# Patient Record
Sex: Female | Born: 1960
Health system: Southern US, Community
[De-identification: ages and names within clinical notes are randomized; demographics above are authoritative.]

## PROBLEM LIST (undated history)

## (undated) DIAGNOSIS — K432 Incisional hernia without obstruction or gangrene: Secondary | ICD-10-CM

## (undated) DIAGNOSIS — I10 Essential (primary) hypertension: Secondary | ICD-10-CM

## (undated) DIAGNOSIS — IMO0001 Reserved for inherently not codable concepts without codable children: Secondary | ICD-10-CM

## (undated) DIAGNOSIS — N281 Cyst of kidney, acquired: Secondary | ICD-10-CM

## (undated) DIAGNOSIS — I82629 Acute embolism and thrombosis of deep veins of unspecified upper extremity: Secondary | ICD-10-CM

## (undated) DIAGNOSIS — C189 Malignant neoplasm of colon, unspecified: Secondary | ICD-10-CM

## (undated) DIAGNOSIS — Z5189 Encounter for other specified aftercare: Secondary | ICD-10-CM

## (undated) DIAGNOSIS — Z8669 Personal history of other diseases of the nervous system and sense organs: Secondary | ICD-10-CM

## (undated) DIAGNOSIS — K5792 Diverticulitis of intestine, part unspecified, without perforation or abscess without bleeding: Secondary | ICD-10-CM

## (undated) DIAGNOSIS — D1800 Hemangioma unspecified site: Secondary | ICD-10-CM

## (undated) DIAGNOSIS — G620 Drug-induced polyneuropathy: Secondary | ICD-10-CM

## (undated) DIAGNOSIS — D649 Anemia, unspecified: Secondary | ICD-10-CM

## (undated) DIAGNOSIS — E119 Type 2 diabetes mellitus without complications: Secondary | ICD-10-CM

## (undated) DIAGNOSIS — C186 Malignant neoplasm of descending colon: Principal | ICD-10-CM

## (undated) DIAGNOSIS — R7303 Prediabetes: Secondary | ICD-10-CM

## (undated) DIAGNOSIS — G4730 Sleep apnea, unspecified: Secondary | ICD-10-CM

## (undated) DIAGNOSIS — K219 Gastro-esophageal reflux disease without esophagitis: Secondary | ICD-10-CM

## (undated) HISTORY — DX: Acute embolism and thrombosis of deep veins of unspecified upper extremity: I82.629

## (undated) HISTORY — DX: Type 2 diabetes mellitus without complications: E11.9

## (undated) HISTORY — DX: Anemia, unspecified: D64.9

## (undated) HISTORY — DX: Sleep apnea, unspecified: G47.30

## (undated) HISTORY — DX: Reserved for inherently not codable concepts without codable children: IMO0001

## (undated) HISTORY — DX: Incisional hernia without obstruction or gangrene: K43.2

## (undated) HISTORY — DX: Malignant neoplasm of colon, unspecified: C18.9

## (undated) HISTORY — DX: Diverticulitis of intestine, part unspecified, without perforation or abscess without bleeding: K57.92

## (undated) HISTORY — DX: Drug-induced polyneuropathy: G62.0

## (undated) HISTORY — DX: Hemangioma unspecified site: D18.00

## (undated) HISTORY — DX: Cyst of kidney, acquired: N28.1

## (undated) HISTORY — DX: Malignant neoplasm of descending colon: C18.6

## (undated) HISTORY — DX: Encounter for other specified aftercare: Z51.89

## (undated) HISTORY — PX: PORTACATH PLACEMENT: SHX2246

## (undated) HISTORY — PX: ABDOMINAL HYSTERECTOMY: SHX81

## (undated) HISTORY — DX: Personal history of other diseases of the nervous system and sense organs: Z86.69

---

## 2005-05-21 ENCOUNTER — Emergency Department (HOSPITAL_COMMUNITY): Admission: EM | Admit: 2005-05-21 | Discharge: 2005-05-21 | Payer: Self-pay | Admitting: Emergency Medicine

## 2005-12-07 ENCOUNTER — Emergency Department (HOSPITAL_COMMUNITY): Admission: EM | Admit: 2005-12-07 | Discharge: 2005-12-07 | Payer: Self-pay | Admitting: Emergency Medicine

## 2006-09-07 ENCOUNTER — Emergency Department (HOSPITAL_COMMUNITY): Admission: EM | Admit: 2006-09-07 | Discharge: 2006-09-07 | Payer: Self-pay | Admitting: Emergency Medicine

## 2007-01-02 ENCOUNTER — Emergency Department (HOSPITAL_COMMUNITY): Admission: EM | Admit: 2007-01-02 | Discharge: 2007-01-03 | Payer: Self-pay | Admitting: Emergency Medicine

## 2007-03-20 ENCOUNTER — Emergency Department (HOSPITAL_COMMUNITY): Admission: EM | Admit: 2007-03-20 | Discharge: 2007-03-20 | Payer: Self-pay | Admitting: Emergency Medicine

## 2007-08-21 ENCOUNTER — Emergency Department (HOSPITAL_COMMUNITY): Admission: EM | Admit: 2007-08-21 | Discharge: 2007-08-21 | Payer: Self-pay | Admitting: Emergency Medicine

## 2010-09-07 ENCOUNTER — Emergency Department (HOSPITAL_COMMUNITY): Admission: EM | Admit: 2010-09-07 | Discharge: 2010-09-07 | Payer: Self-pay | Admitting: Emergency Medicine

## 2010-11-10 DIAGNOSIS — G620 Drug-induced polyneuropathy: Secondary | ICD-10-CM

## 2010-11-10 HISTORY — DX: Drug-induced polyneuropathy: G62.0

## 2011-01-22 LAB — CBC
HCT: 32.8 % — ABNORMAL LOW (ref 36.0–46.0)
Hemoglobin: 10.6 g/dL — ABNORMAL LOW (ref 12.0–15.0)
MCH: 22.6 pg — ABNORMAL LOW (ref 26.0–34.0)
MCHC: 32.3 g/dL (ref 30.0–36.0)
MCV: 70.1 fL — ABNORMAL LOW (ref 78.0–100.0)
Platelets: 191 10*3/uL (ref 150–400)
RBC: 4.68 MIL/uL (ref 3.87–5.11)
RDW: 16.7 % — ABNORMAL HIGH (ref 11.5–15.5)
WBC: 6.3 10*3/uL (ref 4.0–10.5)

## 2011-01-22 LAB — BASIC METABOLIC PANEL
BUN: 10 mg/dL (ref 6–23)
CO2: 26 mEq/L (ref 19–32)
Calcium: 9.1 mg/dL (ref 8.4–10.5)
Chloride: 106 mEq/L (ref 96–112)
Creatinine, Ser: 1.05 mg/dL (ref 0.4–1.2)
GFR calc Af Amer: >60 mL/min (ref >60)
GFR calc non Af Amer: 56 mL/min — ABNORMAL LOW (ref >60)
Glucose, Bld: 101 mg/dL — ABNORMAL HIGH (ref 70–99)
Potassium: 4 mEq/L (ref 3.5–5.1)
Sodium: 136 mEq/L (ref 135–145)

## 2011-01-22 LAB — DIFFERENTIAL
Basophils Absolute: 0 10*3/uL (ref 0.0–0.1)
Basophils Relative: 0 % (ref 0–1)
Eosinophils Absolute: 0.1 10*3/uL (ref 0.0–0.7)
Eosinophils Relative: 2 % (ref 0–5)
Lymphocytes Relative: 33 % (ref 12–46)
Lymphs Abs: 2.1 10*3/uL (ref 0.7–4.0)
Monocytes Absolute: 0.6 10*3/uL (ref 0.1–1.0)
Monocytes Relative: 9 % (ref 3–12)
Neutro Abs: 3.5 10*3/uL (ref 1.7–7.7)
Neutrophils Relative %: 56 % (ref 43–77)

## 2011-01-22 LAB — POCT CARDIAC MARKERS
CKMB, poc: 1.7 ng/mL (ref 1.0–8.0)
CKMB, poc: 2 ng/mL (ref 1.0–8.0)
Myoglobin, poc: 117 ng/mL (ref 12–200)
Myoglobin, poc: 96.2 ng/mL (ref 12–200)
Troponin i, poc: <0.05 ng/mL (ref 0.00–0.09)
Troponin i, poc: <0.05 ng/mL (ref 0.00–0.09)

## 2011-01-22 LAB — PROTIME-INR
INR: 1.05 (ref 0.00–1.49)
Prothrombin Time: 13.9 seconds (ref 11.6–15.2)

## 2011-03-07 ENCOUNTER — Ambulatory Visit (INDEPENDENT_AMBULATORY_CARE_PROVIDER_SITE_OTHER): Payer: PRIVATE HEALTH INSURANCE

## 2011-03-07 ENCOUNTER — Inpatient Hospital Stay (INDEPENDENT_AMBULATORY_CARE_PROVIDER_SITE_OTHER)
Admission: RE | Admit: 2011-03-07 | Discharge: 2011-03-07 | Disposition: A | Discharge status: Home / Self Care | Payer: PRIVATE HEALTH INSURANCE | Source: Ambulatory Visit | Attending: Family Medicine | Admitting: Family Medicine

## 2011-03-07 DIAGNOSIS — R10819 Abdominal tenderness, unspecified site: Secondary | ICD-10-CM

## 2011-03-07 DIAGNOSIS — K921 Melena: Secondary | ICD-10-CM

## 2011-03-07 LAB — POCT URINALYSIS DIP (DEVICE)
Bilirubin Urine: NEGATIVE
Glucose, UA: NEGATIVE mg/dL
Hgb urine dipstick: NEGATIVE
Ketones, ur: NEGATIVE mg/dL
Nitrite: NEGATIVE
Protein, ur: NEGATIVE mg/dL
Specific Gravity, Urine: 1.015 (ref 1.005–1.030)
Urobilinogen, UA: 0.2 mg/dL (ref 0.0–1.0)
pH: 5.5 (ref 5.0–8.0)

## 2011-03-07 LAB — POCT I-STAT, CHEM 8
BUN: 12 mg/dL (ref 6–23)
Calcium, Ion: 1.24 mmol/L (ref 1.12–1.32)
Chloride: 102 mEq/L (ref 96–112)
Creatinine, Ser: 1.1 mg/dL (ref 0.4–1.2)
Glucose, Bld: 94 mg/dL (ref 70–99)
HCT: 39 % (ref 36.0–46.0)
Hemoglobin: 13.3 g/dL (ref 12.0–15.0)
Potassium: 3.8 mEq/L (ref 3.5–5.1)
Sodium: 136 mEq/L (ref 135–145)
TCO2: 26 mmol/L (ref 0–100)

## 2011-03-07 LAB — OCCULT BLOOD, POC DEVICE: Fecal Occult Bld: POSITIVE

## 2011-04-14 ENCOUNTER — Other Ambulatory Visit: Payer: Self-pay | Admitting: Gastroenterology

## 2011-04-14 ENCOUNTER — Ambulatory Visit
Admission: RE | Admit: 2011-04-14 | Discharge: 2011-04-14 | Disposition: A | Discharge status: Home / Self Care | Payer: BC Managed Care – PPO | Source: Ambulatory Visit | Attending: Gastroenterology | Admitting: Gastroenterology

## 2011-04-14 DIAGNOSIS — K6389 Other specified diseases of intestine: Secondary | ICD-10-CM

## 2011-04-14 MED ORDER — IOHEXOL 300 MG/ML  SOLN
100.0000 mL | Freq: Once | INTRAMUSCULAR | Status: AC | PRN
  Administered 2011-04-14: 100 mL via INTRAVENOUS

## 2011-04-22 ENCOUNTER — Encounter (HOSPITAL_BASED_OUTPATIENT_CLINIC_OR_DEPARTMENT_OTHER): Payer: BC Managed Care – PPO | Admitting: Oncology

## 2011-04-22 DIAGNOSIS — D126 Benign neoplasm of colon, unspecified: Secondary | ICD-10-CM

## 2011-04-22 DIAGNOSIS — D509 Iron deficiency anemia, unspecified: Secondary | ICD-10-CM

## 2011-04-22 DIAGNOSIS — K7689 Other specified diseases of liver: Secondary | ICD-10-CM

## 2011-04-22 DIAGNOSIS — A048 Other specified bacterial intestinal infections: Secondary | ICD-10-CM

## 2011-04-24 ENCOUNTER — Other Ambulatory Visit: Payer: Self-pay | Admitting: Oncology

## 2011-04-24 DIAGNOSIS — K769 Liver disease, unspecified: Secondary | ICD-10-CM

## 2011-04-24 DIAGNOSIS — N281 Cyst of kidney, acquired: Secondary | ICD-10-CM

## 2011-04-28 ENCOUNTER — Encounter: Payer: BC Managed Care – PPO | Admitting: Oncology

## 2011-05-01 ENCOUNTER — Other Ambulatory Visit (HOSPITAL_COMMUNITY): Payer: BC Managed Care – PPO

## 2011-05-05 ENCOUNTER — Ambulatory Visit (HOSPITAL_COMMUNITY)
Admission: RE | Admit: 2011-05-05 | Discharge: 2011-05-05 | Disposition: A | Discharge status: Home / Self Care | Payer: BC Managed Care – PPO | Source: Ambulatory Visit | Attending: Oncology | Admitting: Oncology

## 2011-05-05 DIAGNOSIS — K639 Disease of intestine, unspecified: Secondary | ICD-10-CM | POA: Insufficient documentation

## 2011-05-05 DIAGNOSIS — N281 Cyst of kidney, acquired: Secondary | ICD-10-CM

## 2011-05-05 DIAGNOSIS — K7689 Other specified diseases of liver: Secondary | ICD-10-CM | POA: Insufficient documentation

## 2011-05-05 DIAGNOSIS — D1803 Hemangioma of intra-abdominal structures: Secondary | ICD-10-CM | POA: Insufficient documentation

## 2011-05-05 DIAGNOSIS — Q619 Cystic kidney disease, unspecified: Secondary | ICD-10-CM | POA: Insufficient documentation

## 2011-05-05 DIAGNOSIS — K769 Liver disease, unspecified: Secondary | ICD-10-CM

## 2011-05-05 DIAGNOSIS — N289 Disorder of kidney and ureter, unspecified: Secondary | ICD-10-CM | POA: Insufficient documentation

## 2011-05-05 DIAGNOSIS — R109 Unspecified abdominal pain: Secondary | ICD-10-CM | POA: Insufficient documentation

## 2011-05-05 DIAGNOSIS — R634 Abnormal weight loss: Secondary | ICD-10-CM | POA: Insufficient documentation

## 2011-05-05 MED ORDER — GADOBENATE DIMEGLUMINE 529 MG/ML IV SOLN
20.0000 mL | Freq: Once | INTRAVENOUS | Status: AC | PRN
  Administered 2011-05-05: 20 mL via INTRAVENOUS

## 2011-05-09 ENCOUNTER — Other Ambulatory Visit (INDEPENDENT_AMBULATORY_CARE_PROVIDER_SITE_OTHER): Payer: Self-pay | Admitting: General Surgery

## 2011-05-09 ENCOUNTER — Encounter (HOSPITAL_COMMUNITY)
Admission: RE | Admit: 2011-05-09 | Discharge: 2011-05-09 | Disposition: A | Discharge status: Home / Self Care | Payer: BC Managed Care – PPO | Source: Ambulatory Visit | Attending: General Surgery | Admitting: General Surgery

## 2011-05-09 DIAGNOSIS — K635 Polyp of colon: Secondary | ICD-10-CM

## 2011-05-09 LAB — COMPREHENSIVE METABOLIC PANEL
ALT: 27 U/L (ref 0–35)
AST: 19 U/L (ref 0–37)
Albumin: 3.7 g/dL (ref 3.5–5.2)
Alkaline Phosphatase: 68 U/L (ref 39–117)
BUN: 15 mg/dL (ref 6–23)
CO2: 27 mEq/L (ref 19–32)
Calcium: 9.2 mg/dL (ref 8.4–10.5)
Chloride: 104 mEq/L (ref 96–112)
Creatinine, Ser: 0.97 mg/dL (ref 0.50–1.10)
GFR calc Af Amer: >60 mL/min (ref >60)
GFR calc non Af Amer: >60 mL/min (ref >60)
Glucose, Bld: 86 mg/dL (ref 70–99)
Potassium: 4.1 mEq/L (ref 3.5–5.1)
Sodium: 138 mEq/L (ref 135–145)
Total Bilirubin: 0.3 mg/dL (ref 0.3–1.2)
Total Protein: 7.3 g/dL (ref 6.0–8.3)

## 2011-05-09 LAB — DIFFERENTIAL
Basophils Absolute: 0.1 10*3/uL (ref 0.0–0.1)
Basophils Relative: 1 % (ref 0–1)
Eosinophils Absolute: 0.2 10*3/uL (ref 0.0–0.7)
Eosinophils Relative: 3 % (ref 0–5)
Lymphocytes Relative: 36 % (ref 12–46)
Lymphs Abs: 2.7 10*3/uL (ref 0.7–4.0)
Monocytes Absolute: 0.5 10*3/uL (ref 0.1–1.0)
Monocytes Relative: 7 % (ref 3–12)
Neutro Abs: 3.9 10*3/uL (ref 1.7–7.7)
Neutrophils Relative %: 53 % (ref 43–77)

## 2011-05-09 LAB — CBC
HCT: 31.7 % — ABNORMAL LOW (ref 36.0–46.0)
Hemoglobin: 10.7 g/dL — ABNORMAL LOW (ref 12.0–15.0)
MCH: 22.9 pg — ABNORMAL LOW (ref 26.0–34.0)
MCHC: 33.8 g/dL (ref 30.0–36.0)
MCV: 67.7 fL — ABNORMAL LOW (ref 78.0–100.0)
Platelets: 229 10*3/uL (ref 150–400)
RBC: 4.68 MIL/uL (ref 3.87–5.11)
RDW: 17.5 % — ABNORMAL HIGH (ref 11.5–15.5)
WBC: 7.4 10*3/uL (ref 4.0–10.5)

## 2011-05-09 LAB — SURGICAL PCR SCREEN
MRSA, PCR: NEGATIVE
Staphylococcus aureus: POSITIVE — AB

## 2011-05-12 ENCOUNTER — Inpatient Hospital Stay (HOSPITAL_COMMUNITY)
Admission: RE | Admit: 2011-05-12 | Discharge: 2011-05-18 | DRG: 148 | Disposition: A | Discharge status: Home / Self Care | Payer: BC Managed Care – PPO | Source: Ambulatory Visit | Attending: General Surgery | Admitting: General Surgery

## 2011-05-12 ENCOUNTER — Other Ambulatory Visit (INDEPENDENT_AMBULATORY_CARE_PROVIDER_SITE_OTHER): Payer: Self-pay | Admitting: General Surgery

## 2011-05-12 DIAGNOSIS — C186 Malignant neoplasm of descending colon: Secondary | ICD-10-CM

## 2011-05-12 DIAGNOSIS — C189 Malignant neoplasm of colon, unspecified: Secondary | ICD-10-CM

## 2011-05-12 DIAGNOSIS — K219 Gastro-esophageal reflux disease without esophagitis: Secondary | ICD-10-CM | POA: Diagnosis present

## 2011-05-12 DIAGNOSIS — C772 Secondary and unspecified malignant neoplasm of intra-abdominal lymph nodes: Secondary | ICD-10-CM | POA: Diagnosis present

## 2011-05-12 DIAGNOSIS — K560 Paralytic ileus: Secondary | ICD-10-CM | POA: Diagnosis not present

## 2011-05-12 DIAGNOSIS — Z01812 Encounter for preprocedural laboratory examination: Secondary | ICD-10-CM

## 2011-05-12 DIAGNOSIS — C187 Malignant neoplasm of sigmoid colon: Principal | ICD-10-CM | POA: Diagnosis present

## 2011-05-12 HISTORY — DX: Malignant neoplasm of descending colon: C18.6

## 2011-05-12 HISTORY — DX: Malignant neoplasm of colon, unspecified: C18.9

## 2011-05-12 LAB — CEA: CEA: 3.6 ng/mL (ref 0.0–5.0)

## 2011-05-12 LAB — TYPE AND SCREEN
ABO/RH(D): O POS
Antibody Screen: NEGATIVE

## 2011-05-12 LAB — ABO/RH: ABO/RH(D): O POS

## 2011-05-13 LAB — BASIC METABOLIC PANEL
BUN: 9 mg/dL (ref 6–23)
CO2: 25 mEq/L (ref 19–32)
Calcium: 8.1 mg/dL — ABNORMAL LOW (ref 8.4–10.5)
Chloride: 101 mEq/L (ref 96–112)
Creatinine, Ser: 0.89 mg/dL (ref 0.50–1.10)
GFR calc Af Amer: >60 mL/min (ref >60)
GFR calc non Af Amer: >60 mL/min (ref >60)
Glucose, Bld: 114 mg/dL — ABNORMAL HIGH (ref 70–99)
Potassium: 4.6 mEq/L (ref 3.5–5.1)
Sodium: 131 mEq/L — ABNORMAL LOW (ref 135–145)

## 2011-05-13 LAB — CBC
HCT: 30.1 % — ABNORMAL LOW (ref 36.0–46.0)
Hemoglobin: 10 g/dL — ABNORMAL LOW (ref 12.0–15.0)
MCH: 22.7 pg — ABNORMAL LOW (ref 26.0–34.0)
MCHC: 33.2 g/dL (ref 30.0–36.0)
MCV: 68.3 fL — ABNORMAL LOW (ref 78.0–100.0)
Platelets: 195 10*3/uL (ref 150–400)
RBC: 4.41 MIL/uL (ref 3.87–5.11)
RDW: 17.2 % — ABNORMAL HIGH (ref 11.5–15.5)
WBC: 7 10*3/uL (ref 4.0–10.5)

## 2011-05-13 LAB — GLUCOSE, CAPILLARY
Glucose-Capillary: 159 mg/dL — ABNORMAL HIGH (ref 70–99)
Glucose-Capillary: 104 mg/dL — ABNORMAL HIGH (ref 70–99)
Glucose-Capillary: 134 mg/dL — ABNORMAL HIGH (ref 70–99)

## 2011-05-13 LAB — DIFFERENTIAL
Basophils Absolute: 0 10*3/uL (ref 0.0–0.1)
Basophils Relative: 0 % (ref 0–1)
Eosinophils Absolute: 0 10*3/uL (ref 0.0–0.7)
Eosinophils Relative: 0 % (ref 0–5)
Lymphocytes Relative: 12 % (ref 12–46)
Lymphs Abs: 0.8 10*3/uL (ref 0.7–4.0)
Monocytes Absolute: 0.6 10*3/uL (ref 0.1–1.0)
Monocytes Relative: 8 % (ref 3–12)
Neutro Abs: 5.6 10*3/uL (ref 1.7–7.7)
Neutrophils Relative %: 80 % — ABNORMAL HIGH (ref 43–77)

## 2011-05-13 NOTE — Op Note (Signed)
NAME:  Mary [name], Mary [name] NO.:  [id number]  MEDICAL RECORD NO.:  [id number]  LOCATION:  5156                         FACILITY:  [facility]  PHYSICIAN:  Cherylynn Ridges, M.D.    DATE OF BIRTH:  Mar 11, 1961  DATE OF PROCEDURE: DATE OF DISCHARGE:                              OPERATIVE REPORT   Patient of Dr. Charna Elizabeth.  PREOPERATIVE DIAGNOSIS:  Tubular adenoma with high-grade dysplasia of the sigmoid colon.  POSTOPERATIVE DIAGNOSIS:  Likely metastatic mucinous adenocarcinoma of the sigmoid colon.  PROCEDURE:  Sigmoid colectomy, limited left colectomy with a lymph node biopsy of the mesentery.  SURGEON:  Cherylynn Ridges, MD  ASSISTANT:  Gabrielle Dare. Janee Morn, MD  ANESTHESIA:  General endotracheal.  ESTIMATED BLOOD LOSS:  Less than 50 mL.  COMPLICATIONS:  None.  CONDITION:  Stable.  FINDINGS:  Multiple hard lymph nodes in the mesentery of the resected small bowel with the multiple other ones and the mesentery of the left colon.  There is some stapling along the capsule of the liver with no dominant masses in the liver.  Frozen section demonstrated a likely mucinous adenocarcinoma.  INDICATIONS FOR OPERATION:  The patient is a 50 year old female with a rectal bleeding.  Had a colonoscopy demonstrating a large mass in the descending colon distally, proximal sigmoid colon who comes now for resection.  OPERATION:  The patient was taken to the operating room and placed on the table in supine position.  After an adequate general endotracheal anesthetic was administered, she was prepped and draped in the usual sterile manner exposing the entire abdomen.  After proper time-out was performed, I identifying the patient and the procedure to be performed.  A midline incision was made using #10 blade and taken just to the right of the umbilicus down to the pubic crest. We entered into the peritoneal cavity using electrocautery.  There was no injury to the bowel during  entrance.  We used a Engineer, manufacturing for primary exposure along with bladder blade.  Upon palpating the abdominal cavities, a large tumor could be palpated in the midsigmoid colon.  We palpated the liver also where there were no dominant masses, there was a very small very soft nodule on the lateral aspect of the right lobe of the liver, likely representing the hemangioma that was seen on CT scan.  There was stapling along the surface of the capsule of the liver of the right lobe, but no dominant masses.  No biopsies were taken.  We took proximal and distal margins on the tumor using a GIA-75 stapler.  We came across distally initially and now palpated large firm nodes in the mesentery of the sigmoid colon.  We resected down to the base of the mesentery making sure that we lifted up off the left lateral peritoneal reflection leaving the ureter and vessels in place.  Care was taken not to injure any of these.  We got about 8-9 cm on the distal margin and proximal margin about 12 cm using a GIA-75 stapler.  We came across the base using a LigaSure device and also the main sigmoid artery was taken using a Kelly clamp and 2-0 silk tie.  Subsequent to removing the primary specimen, we marked the distal margin using a silk suture.  We could palpate in the base of the mesentery of the segment that was left in proximally.  A large lymph node, there were no other lymph nodes noted up to the splenic flexure.  We did remove this and sent it as a frozen section and it showed complete replacement of the lymph node with metastatic mucinous adenocarcinoma.  We did a primary and side-to-side functional end-to-end anastomosis between the descending colon and the distal sigmoid colon using a GIA-75 stapler.  The resulting enterotomy was closed using a TA-60 3.5-mm stapler.  The mesentery was closed using interrupted 2-0 silk sutures. Once this was done, the surgeon changed the gloves.  We  subsequently came back and irrigated with saline solution.  No other palpable masses were noted.  We closed the abdomen using a looped #1 PDS suture, then a skin was closed using stainless steel staples.  No drains were left in place.  All counts were correct.  The ureter was identified easily. There was some bleeding from the gonadal vessel, this was controlled with a 3-0 silk suture.  All counts were correct.  Sterile dressing was applied including Betadine ointment and 4x4s.     Cherylynn Ridges, M.D.     JOW/MEDQ  D:  05/12/2011  T:  05/13/2011  Job:  [id number]  cc:   Anselmo Rod, MD, [facility]  Electronically Signed by Jimmye Norman M.D. on 05/13/2011 08:25:10 AM

## 2011-05-14 LAB — GLUCOSE, CAPILLARY
Glucose-Capillary: 118 mg/dL — ABNORMAL HIGH (ref 70–99)
Glucose-Capillary: 118 mg/dL — ABNORMAL HIGH (ref 70–99)

## 2011-05-17 HISTORY — PX: COLON SURGERY: SHX602

## 2011-05-17 LAB — CBC
HCT: 31.1 % — ABNORMAL LOW (ref 36.0–46.0)
Hemoglobin: 10.7 g/dL — ABNORMAL LOW (ref 12.0–15.0)
MCH: 23.3 pg — ABNORMAL LOW (ref 26.0–34.0)
MCHC: 34.4 g/dL (ref 30.0–36.0)
MCV: 67.6 fL — ABNORMAL LOW (ref 78.0–100.0)
Platelets: 281 10*3/uL (ref 150–400)
RBC: 4.6 MIL/uL (ref 3.87–5.11)
RDW: 16.6 % — ABNORMAL HIGH (ref 11.5–15.5)
WBC: 8.8 10*3/uL (ref 4.0–10.5)

## 2011-05-17 LAB — BASIC METABOLIC PANEL
BUN: 8 mg/dL (ref 6–23)
CO2: 29 mEq/L (ref 19–32)
Calcium: 9.2 mg/dL (ref 8.4–10.5)
Chloride: 100 mEq/L (ref 96–112)
Creatinine, Ser: 0.96 mg/dL (ref 0.50–1.10)
GFR calc Af Amer: >60 mL/min (ref >60)
GFR calc non Af Amer: >60 mL/min (ref >60)
Glucose, Bld: 108 mg/dL — ABNORMAL HIGH (ref 70–99)
Potassium: 4.3 mEq/L (ref 3.5–5.1)
Sodium: 136 mEq/L (ref 135–145)

## 2011-05-17 LAB — DIFFERENTIAL
Basophils Absolute: 0.1 10*3/uL (ref 0.0–0.1)
Basophils Relative: 1 % (ref 0–1)
Eosinophils Absolute: 0.4 10*3/uL (ref 0.0–0.7)
Eosinophils Relative: 4 % (ref 0–5)
Lymphocytes Relative: 16 % (ref 12–46)
Lymphs Abs: 1.4 10*3/uL (ref 0.7–4.0)
Monocytes Absolute: 0.7 10*3/uL (ref 0.1–1.0)
Monocytes Relative: 8 % (ref 3–12)
Neutro Abs: 6.2 10*3/uL (ref 1.7–7.7)
Neutrophils Relative %: 71 % (ref 43–77)
Smear Review: ADEQUATE

## 2011-05-22 ENCOUNTER — Ambulatory Visit (INDEPENDENT_AMBULATORY_CARE_PROVIDER_SITE_OTHER): Payer: PRIVATE HEALTH INSURANCE | Admitting: General Surgery

## 2011-05-22 DIAGNOSIS — Z09 Encounter for follow-up examination after completed treatment for conditions other than malignant neoplasm: Secondary | ICD-10-CM

## 2011-05-22 NOTE — Progress Notes (Signed)
Mary [name]  The patient is here status post sigmoid colectomy for colon cancer. She had metastasis to 4/17 lymph nodes she is to see Dr. Truett Perna on 29 May 2011 she should return to see me in 3 weeks.  She comes in today for wound check and removal of her staples. The wound looks well with no evidence of infection the patient states that she had some problems with constipation and she went home however that has improved with the use of Colace.

## 2011-05-29 ENCOUNTER — Encounter (HOSPITAL_BASED_OUTPATIENT_CLINIC_OR_DEPARTMENT_OTHER): Payer: BC Managed Care – PPO | Admitting: Oncology

## 2011-05-29 DIAGNOSIS — D509 Iron deficiency anemia, unspecified: Secondary | ICD-10-CM

## 2011-05-29 DIAGNOSIS — C186 Malignant neoplasm of descending colon: Secondary | ICD-10-CM

## 2011-05-30 NOTE — Discharge Summary (Signed)
  NAME:  Mary [name], GRAYS NO.:  [id number]  MEDICAL RECORD NO.:  [id number]  LOCATION:                                 FACILITY:  PHYSICIAN:  Cherylynn Ridges, M.D.    DATE OF BIRTH:  [birth year]  DATE OF ADMISSION: DATE OF DISCHARGE:                              DISCHARGE SUMMARY   DISCHARGE DIAGNOSES: 1. Stage III well-differentiated adenocarcinoma of the sigmoid colon. 2. History of a vaginal hysterectomy.  She was admitted on no new medication.  She was discharged to home on Percocet to take p.r.n. pain.  For followup to see me on Thursday, May 22, 2011, primarily for staple removal.  Condition is stable.  She can shower when she goes home.  Her diet is a soft diet to be advanced to regular.  Condition is stable.  Followup is going to be with myself and Dr. Mancel Bale.  BRIEF SUMMARY OF HOSPITAL COURSE:  The patient was admitted the day of surgery, which was May 12, 2011, with a known dysplastic tubular adenoma of the descending colon and sigmoid colon junction.  At the time of surgery, the patient had no palpable intraparenchymal liver lesions, but she did have some palpable lymph nodes in her colonic mesentery. Multiple nodes were removed.  One was removed as the biopsy showed replacement of the node with mucinous adenocarcinoma.  The patient had 4 out of 17 lymph nodes positive for metastatic disease in her resected specimen.  A primary anastomosis was performed without event.  The patient has done well.  Postop day #1, she was on alvimopan, perioperative motility medication. She was on clear liquids from day 1.  She was advanced to full liquids on postop day #3, to a soft diet on postop day #4.  She began to have bowel movements on postop day #5, passing gas on postop day #4.  Her wound looks well with no evidence of any drainage.  Staples are intact.  There was no erythema.  She has no tenderness.  She had no significant fevers.  Her hemoglobin has  been around 10 on two different checks on postop day #1 and postop day #4.  She will return to see me in the clinic on Thursday for followup and staple removal.  During the hospitalization, oncology consultation was obtained.  The patient had been seen by Dr. Mancel Bale prior to this admission per Dr. Kenna Gilbert request because of suspicion of adenocarcinoma and he will continue to follow her postoperatively.     Cherylynn Ridges, M.D.     JOW/MEDQ  D:  05/18/2011  T:  05/18/2011  Job:  [id number]  cc:   Ladene Artist, M.D. Anselmo Rod, MD, [facility]  Electronically Signed by Jimmye Norman M.D. on 05/29/2011 11:58:26 PM

## 2011-06-02 ENCOUNTER — Ambulatory Visit (HOSPITAL_COMMUNITY): Payer: BC Managed Care – PPO

## 2011-06-02 ENCOUNTER — Ambulatory Visit (HOSPITAL_COMMUNITY)
Admission: RE | Admit: 2011-06-02 | Discharge: 2011-06-02 | Disposition: A | Discharge status: Home / Self Care | Payer: BC Managed Care – PPO | Source: Ambulatory Visit | Attending: General Surgery | Admitting: General Surgery

## 2011-06-02 DIAGNOSIS — C189 Malignant neoplasm of colon, unspecified: Secondary | ICD-10-CM

## 2011-06-02 DIAGNOSIS — Z01818 Encounter for other preprocedural examination: Secondary | ICD-10-CM | POA: Insufficient documentation

## 2011-06-02 DIAGNOSIS — Z01812 Encounter for preprocedural laboratory examination: Secondary | ICD-10-CM | POA: Insufficient documentation

## 2011-06-02 DIAGNOSIS — C801 Malignant (primary) neoplasm, unspecified: Secondary | ICD-10-CM | POA: Insufficient documentation

## 2011-06-02 LAB — POCT I-STAT 4, (NA,K, GLUC, HGB,HCT)
Glucose, Bld: 116 mg/dL — ABNORMAL HIGH (ref 70–99)
HCT: 37 % (ref 36.0–46.0)
Hemoglobin: 12.6 g/dL (ref 12.0–15.0)
Potassium: 3.6 mEq/L (ref 3.5–5.1)
Sodium: 139 mEq/L (ref 135–145)

## 2011-06-03 NOTE — Op Note (Signed)
NAME:  Mary [name], Mary [name] NO.:  [id number]  MEDICAL RECORD NO.:  [id number]  LOCATION:  SDSC                         FACILITY:  [facility]  PHYSICIAN:  Cherylynn Ridges, M.D.    DATE OF BIRTH:  1961-10-28  DATE OF PROCEDURE:  06/02/2011 DATE OF DISCHARGE:  06/02/2011                              OPERATIVE REPORT   PREOPERATIVE DIAGNOSIS:  Metastatic colon cancer.  POSTOPERATIVE DIAGNOSIS:  Metastatic colon cancer.  PROCEDURE:  Placement of left subclavian Port-A-Cath, it is an 8-French single-lumen polypoid.  SURGEON:  Cherylynn Ridges, MD  ANESTHESIA:  Monitored anesthesia care with a 22.5 mL of 1% Xylocaine.  COMPLICATIONS:  None.  CONDITION:  Stable.  FINDINGS:  The clavicular first rib junction was tight allowing passage of the dilator, but there was some tightness when moving the dilator and the introducer.  This probably will be better in the upright position.  INDICATIONS FOR OPERATION:  The patient was recently diagnosed with metastatic colon cancer to multiple lymph nodes who now comes in for Port-A-Cath placement for chemotherapy.  OPERATION:  The patient was taken to the operating room, placed on table in supine position.  After an adequate amount of IV sedation was given, a roll was placed beneath the shoulder for positioning and she was prepped and draped in usual sterile manner exposing the left IJ and subclavian area.  After proper time-out was performed, identifying the patient and a procedure to be done.  We anesthetized subclavian area and used about 10 mL of 1% Xylocaine solution.  We used a 14-gauge long finder needed to go under the clavicle into the vein initially hitting the vein, however, could not get good flow and could not pass a wire.  It took three other tries in order to get into the vein without ultrasound guidance; however, there was no retrieval of air.  We subsequently punctured the vein and actually we were able to pass a wire  into the subclavian system down towards the heart confirmed by fluoroscopy.  There was excellent return of blood during that stay.  We placed a port site just above the left breast and opened it up by using Xylocaine in the skin and then finger to dissect out the pocket.  We tunneled the catheter from the pocket area up to the subclavian wire site and brought out the flush catheter, which had been flushed with heparinized saline solution.  We placed two 3-0 Vicryl sutures in the base of the pocket, lift them there along with an antibiotic-soaked saline sponge in the wound for cleansing.  We then passed a catheter through the introducer into the subclavian system under fluoroscopy, pulling it back to the cavoatrial junction under fluoro.  Once it was in adequate position, we cut the catheter to the appropriate length, attached it to the flushed port and then locked it with a locking cap.  We secured it in the pocket using a 3-0 Vicryl that were in the pocket.  Fluoroscopy demonstrated good positioning of the catheter.  Once that was done, we closed off the pocket site using a 3-0 Vicryl deep stitch and then a 5-0 Vicryl subcuticular stitch.  The  subclavian entrance site was closed using 5-0 Vicryl.  Dermabond, Steri-Strips, and Tegaderm were used to complete all dressing.  We subsequently flushed the catheter completely with 3 mL of 1000 units/mL heparin solution.  No needle was left in the port.     Cherylynn Ridges, M.D.     JOW/MEDQ  D:  06/02/2011  T:  06/02/2011  Job:  [id number]  cc:   Ladene Artist, M.D. Jordan Hawks Elnoria Howard, MD  Electronically Signed by Jimmye Norman M.D. on 06/03/2011 09:52:30 PM

## 2011-06-04 ENCOUNTER — Ambulatory Visit (INDEPENDENT_AMBULATORY_CARE_PROVIDER_SITE_OTHER): Payer: PRIVATE HEALTH INSURANCE

## 2011-06-04 DIAGNOSIS — Z5189 Encounter for other specified aftercare: Secondary | ICD-10-CM

## 2011-06-04 NOTE — Progress Notes (Signed)
Mary [name] came in for me to look at the area where her PAC was inserted because it was red and itching. The area where it was inserted looked fine but above it where the tegaderm tape was on her skin was red and that's where she said it was itching. I told her it looked to me to be a reaction to the tape. I put ointment on a 4x4 and covered the area up. I told her to keep the area dry and to call if the area doesn't get better or worsens. She has a follow up appoint on Aug 21st to see Mary [name].

## 2011-06-05 ENCOUNTER — Other Ambulatory Visit: Payer: Self-pay | Admitting: Oncology

## 2011-06-05 ENCOUNTER — Encounter (HOSPITAL_BASED_OUTPATIENT_CLINIC_OR_DEPARTMENT_OTHER): Payer: BC Managed Care – PPO | Admitting: Oncology

## 2011-06-05 DIAGNOSIS — A048 Other specified bacterial intestinal infections: Secondary | ICD-10-CM

## 2011-06-05 DIAGNOSIS — D509 Iron deficiency anemia, unspecified: Secondary | ICD-10-CM

## 2011-06-05 DIAGNOSIS — K7689 Other specified diseases of liver: Secondary | ICD-10-CM

## 2011-06-05 DIAGNOSIS — D126 Benign neoplasm of colon, unspecified: Secondary | ICD-10-CM

## 2011-06-05 LAB — CBC WITH DIFFERENTIAL/PLATELET
BASO%: 0.3 % (ref 0.0–2.0)
Basophils Absolute: 0 10*3/uL (ref 0.0–0.1)
EOS%: 14.2 % — ABNORMAL HIGH (ref 0.0–7.0)
Eosinophils Absolute: 1 10*3/uL — ABNORMAL HIGH (ref 0.0–0.5)
HCT: 32.9 % — ABNORMAL LOW (ref 34.8–46.6)
HGB: 10.7 g/dL — ABNORMAL LOW (ref 11.6–15.9)
LYMPH%: 30.9 % (ref 14.0–49.7)
MCH: 22.1 pg — ABNORMAL LOW (ref 25.1–34.0)
MCHC: 32.5 g/dL (ref 31.5–36.0)
MCV: 67.8 fL — ABNORMAL LOW (ref 79.5–101.0)
MONO#: 0.4 10*3/uL (ref 0.1–0.9)
MONO%: 5.2 % (ref 0.0–14.0)
NEUT#: 3.5 10*3/uL (ref 1.5–6.5)
NEUT%: 49.4 % (ref 38.4–76.8)
Platelets: 189 10*3/uL (ref 145–400)
RBC: 4.85 10*6/uL (ref 3.70–5.45)
RDW: 16.9 % — ABNORMAL HIGH (ref 11.2–14.5)
WBC: 7 10*3/uL (ref 3.9–10.3)
lymph#: 2.2 10*3/uL (ref 0.9–3.3)
nRBC: 0 % (ref 0–0)

## 2011-06-05 LAB — COMPREHENSIVE METABOLIC PANEL
ALT: 13 U/L (ref 0–35)
AST: 13 U/L (ref 0–37)
Albumin: 4.1 g/dL (ref 3.5–5.2)
Alkaline Phosphatase: 72 U/L (ref 39–117)
BUN: 12 mg/dL (ref 6–23)
CO2: 24 mEq/L (ref 19–32)
Calcium: 9.4 mg/dL (ref 8.4–10.5)
Chloride: 104 mEq/L (ref 96–112)
Creatinine, Ser: 1.13 mg/dL — ABNORMAL HIGH (ref 0.50–1.10)
Glucose, Bld: 134 mg/dL — ABNORMAL HIGH (ref 70–99)
Potassium: 3.8 mEq/L (ref 3.5–5.3)
Sodium: 138 mEq/L (ref 135–145)
Total Bilirubin: 0.5 mg/dL (ref 0.3–1.2)
Total Protein: 7 g/dL (ref 6.0–8.3)

## 2011-06-05 LAB — CEA: CEA: 1.7 ng/mL (ref 0.0–5.0)

## 2011-06-10 ENCOUNTER — Encounter (HOSPITAL_BASED_OUTPATIENT_CLINIC_OR_DEPARTMENT_OTHER): Payer: BC Managed Care – PPO | Admitting: Oncology

## 2011-06-10 ENCOUNTER — Encounter (INDEPENDENT_AMBULATORY_CARE_PROVIDER_SITE_OTHER): Payer: PRIVATE HEALTH INSURANCE | Admitting: General Surgery

## 2011-06-10 ENCOUNTER — Other Ambulatory Visit: Payer: Self-pay | Admitting: Oncology

## 2011-06-10 DIAGNOSIS — K7689 Other specified diseases of liver: Secondary | ICD-10-CM

## 2011-06-10 DIAGNOSIS — D126 Benign neoplasm of colon, unspecified: Secondary | ICD-10-CM

## 2011-06-10 DIAGNOSIS — D509 Iron deficiency anemia, unspecified: Secondary | ICD-10-CM

## 2011-06-10 DIAGNOSIS — A048 Other specified bacterial intestinal infections: Secondary | ICD-10-CM

## 2011-06-10 DIAGNOSIS — Z5111 Encounter for antineoplastic chemotherapy: Secondary | ICD-10-CM

## 2011-06-10 LAB — CBC WITH DIFFERENTIAL/PLATELET
BASO%: 1 % (ref 0.0–2.0)
Basophils Absolute: 0.1 10*3/uL (ref 0.0–0.1)
EOS%: 5.7 % (ref 0.0–7.0)
Eosinophils Absolute: 0.3 10*3/uL (ref 0.0–0.5)
HCT: 32 % — ABNORMAL LOW (ref 34.8–46.6)
HGB: 10.2 g/dL — ABNORMAL LOW (ref 11.6–15.9)
LYMPH%: 29.1 % (ref 14.0–49.7)
MCH: 22.7 pg — ABNORMAL LOW (ref 25.1–34.0)
MCHC: 31.9 g/dL (ref 31.5–36.0)
MCV: 71.2 fL — ABNORMAL LOW (ref 79.5–101.0)
MONO#: 0.6 10*3/uL (ref 0.1–0.9)
MONO%: 10.4 % (ref 0.0–14.0)
NEUT#: 2.9 10*3/uL (ref 1.5–6.5)
NEUT%: 53.8 % (ref 38.4–76.8)
Platelets: 156 10*3/uL (ref 145–400)
RBC: 4.49 10*6/uL (ref 3.70–5.45)
RDW: 18.2 % — ABNORMAL HIGH (ref 11.2–14.5)
WBC: 5.4 10*3/uL (ref 3.9–10.3)
lymph#: 1.6 10*3/uL (ref 0.9–3.3)

## 2011-06-11 ENCOUNTER — Encounter: Payer: BC Managed Care – PPO | Admitting: Oncology

## 2011-06-12 ENCOUNTER — Encounter (HOSPITAL_BASED_OUTPATIENT_CLINIC_OR_DEPARTMENT_OTHER): Payer: BC Managed Care – PPO | Admitting: Oncology

## 2011-06-12 DIAGNOSIS — A048 Other specified bacterial intestinal infections: Secondary | ICD-10-CM

## 2011-06-12 DIAGNOSIS — D126 Benign neoplasm of colon, unspecified: Secondary | ICD-10-CM

## 2011-06-12 DIAGNOSIS — D509 Iron deficiency anemia, unspecified: Secondary | ICD-10-CM

## 2011-06-12 DIAGNOSIS — K7689 Other specified diseases of liver: Secondary | ICD-10-CM

## 2011-06-24 ENCOUNTER — Other Ambulatory Visit: Payer: Self-pay | Admitting: Oncology

## 2011-06-24 ENCOUNTER — Encounter (HOSPITAL_BASED_OUTPATIENT_CLINIC_OR_DEPARTMENT_OTHER): Payer: BC Managed Care – PPO | Admitting: Oncology

## 2011-06-24 DIAGNOSIS — A048 Other specified bacterial intestinal infections: Secondary | ICD-10-CM

## 2011-06-24 DIAGNOSIS — Z5111 Encounter for antineoplastic chemotherapy: Secondary | ICD-10-CM

## 2011-06-24 DIAGNOSIS — C186 Malignant neoplasm of descending colon: Secondary | ICD-10-CM

## 2011-06-24 DIAGNOSIS — K7689 Other specified diseases of liver: Secondary | ICD-10-CM

## 2011-06-24 DIAGNOSIS — D509 Iron deficiency anemia, unspecified: Secondary | ICD-10-CM

## 2011-06-24 DIAGNOSIS — D126 Benign neoplasm of colon, unspecified: Secondary | ICD-10-CM

## 2011-06-24 LAB — CBC WITH DIFFERENTIAL/PLATELET
BASO%: 0.6 % (ref 0.0–2.0)
Basophils Absolute: 0 10*3/uL (ref 0.0–0.1)
EOS%: 6.3 % (ref 0.0–7.0)
Eosinophils Absolute: 0.3 10*3/uL (ref 0.0–0.5)
HCT: 31.1 % — ABNORMAL LOW (ref 34.8–46.6)
HGB: 10.1 g/dL — ABNORMAL LOW (ref 11.6–15.9)
LYMPH%: 41.1 % (ref 14.0–49.7)
MCH: 22.1 pg — ABNORMAL LOW (ref 25.1–34.0)
MCHC: 32.5 g/dL (ref 31.5–36.0)
MCV: 67.9 fL — ABNORMAL LOW (ref 79.5–101.0)
MONO#: 0.6 10*3/uL (ref 0.1–0.9)
MONO%: 13 % (ref 0.0–14.0)
NEUT#: 1.8 10*3/uL (ref 1.5–6.5)
NEUT%: 39 % (ref 38.4–76.8)
Platelets: 198 10*3/uL (ref 145–400)
RBC: 4.58 10*6/uL (ref 3.70–5.45)
RDW: 16.5 % — ABNORMAL HIGH (ref 11.2–14.5)
WBC: 4.6 10*3/uL (ref 3.9–10.3)
lymph#: 1.9 10*3/uL (ref 0.9–3.3)
nRBC: 0 % (ref 0–0)

## 2011-06-24 LAB — COMPREHENSIVE METABOLIC PANEL
ALT: 12 U/L (ref 0–35)
AST: 13 U/L (ref 0–37)
Albumin: 3.6 g/dL (ref 3.5–5.2)
Alkaline Phosphatase: 81 U/L (ref 39–117)
BUN: 10 mg/dL (ref 6–23)
CO2: 27 mEq/L (ref 19–32)
Calcium: 9.4 mg/dL (ref 8.4–10.5)
Chloride: 102 mEq/L (ref 96–112)
Creatinine, Ser: 0.91 mg/dL (ref 0.50–1.10)
Glucose, Bld: 121 mg/dL — ABNORMAL HIGH (ref 70–99)
Potassium: 3.1 mEq/L — ABNORMAL LOW (ref 3.5–5.3)
Sodium: 136 mEq/L (ref 135–145)
Total Bilirubin: 0.3 mg/dL (ref 0.3–1.2)
Total Protein: 7 g/dL (ref 6.0–8.3)

## 2011-06-25 ENCOUNTER — Encounter: Payer: BC Managed Care – PPO | Admitting: Oncology

## 2011-06-25 ENCOUNTER — Other Ambulatory Visit: Payer: Self-pay | Admitting: Oncology

## 2011-06-25 LAB — RESEARCH LABS

## 2011-06-26 ENCOUNTER — Encounter (HOSPITAL_BASED_OUTPATIENT_CLINIC_OR_DEPARTMENT_OTHER): Payer: BC Managed Care – PPO | Admitting: Oncology

## 2011-06-26 DIAGNOSIS — C186 Malignant neoplasm of descending colon: Secondary | ICD-10-CM

## 2011-07-01 ENCOUNTER — Ambulatory Visit (INDEPENDENT_AMBULATORY_CARE_PROVIDER_SITE_OTHER): Payer: PRIVATE HEALTH INSURANCE | Admitting: General Surgery

## 2011-07-01 ENCOUNTER — Encounter (INDEPENDENT_AMBULATORY_CARE_PROVIDER_SITE_OTHER): Payer: Self-pay | Admitting: General Surgery

## 2011-07-01 DIAGNOSIS — Z09 Encounter for follow-up examination after completed treatment for conditions other than malignant neoplasm: Secondary | ICD-10-CM

## 2011-07-01 NOTE — Progress Notes (Signed)
HPI She is doing well status post Port-A-Cath placement. Feeling tired after chemotherapy  PE Port-A-Cath site looks well no infection and is working well the patient does complain of some pain in her left neck but this does not appear to be related to the surgery.  Studiy review There are no studies to review.  Assessment Status post Port-A-Cath placement for chemotherapy. Metastatic colon cancer.  Plan [] 

## 2011-07-04 ENCOUNTER — Emergency Department (HOSPITAL_COMMUNITY): Payer: BC Managed Care – PPO

## 2011-07-04 ENCOUNTER — Inpatient Hospital Stay (HOSPITAL_COMMUNITY)
Admission: EM | Admit: 2011-07-04 | Discharge: 2011-07-07 | DRG: 130 | Disposition: A | Discharge status: Home / Self Care | Payer: BC Managed Care – PPO | Attending: Internal Medicine | Admitting: Internal Medicine

## 2011-07-04 DIAGNOSIS — I82629 Acute embolism and thrombosis of deep veins of unspecified upper extremity: Secondary | ICD-10-CM

## 2011-07-04 DIAGNOSIS — D509 Iron deficiency anemia, unspecified: Secondary | ICD-10-CM | POA: Diagnosis present

## 2011-07-04 DIAGNOSIS — K5900 Constipation, unspecified: Secondary | ICD-10-CM | POA: Diagnosis present

## 2011-07-04 DIAGNOSIS — Z9221 Personal history of antineoplastic chemotherapy: Secondary | ICD-10-CM

## 2011-07-04 DIAGNOSIS — C189 Malignant neoplasm of colon, unspecified: Secondary | ICD-10-CM | POA: Diagnosis present

## 2011-07-04 DIAGNOSIS — I82890 Acute embolism and thrombosis of other specified veins: Principal | ICD-10-CM | POA: Diagnosis present

## 2011-07-04 DIAGNOSIS — M79609 Pain in unspecified limb: Secondary | ICD-10-CM

## 2011-07-04 DIAGNOSIS — E876 Hypokalemia: Secondary | ICD-10-CM | POA: Diagnosis present

## 2011-07-04 HISTORY — DX: Acute embolism and thrombosis of deep veins of unspecified upper extremity: I82.629

## 2011-07-04 LAB — DIFFERENTIAL
Basophils Absolute: 0 10*3/uL (ref 0.0–0.1)
Basophils Relative: 0 % (ref 0–1)
Eosinophils Absolute: 0.1 10*3/uL (ref 0.0–0.7)
Eosinophils Relative: 2 % (ref 0–5)
Lymphocytes Relative: 30 % (ref 12–46)
Lymphs Abs: 2 10*3/uL (ref 0.7–4.0)
Monocytes Absolute: 0.5 10*3/uL (ref 0.1–1.0)
Monocytes Relative: 7 % (ref 3–12)
Neutro Abs: 4.2 10*3/uL (ref 1.7–7.7)
Neutrophils Relative %: 61 % (ref 43–77)

## 2011-07-04 LAB — CBC
HCT: 31.3 % — ABNORMAL LOW (ref 36.0–46.0)
Hemoglobin: 10 g/dL — ABNORMAL LOW (ref 12.0–15.0)
MCH: 21.6 pg — ABNORMAL LOW (ref 26.0–34.0)
MCHC: 31.9 g/dL (ref 30.0–36.0)
MCV: 67.7 fL — ABNORMAL LOW (ref 78.0–100.0)
Platelets: ADEQUATE 10*3/uL (ref 150–400)
RBC: 4.62 MIL/uL (ref 3.87–5.11)
RDW: 17 % — ABNORMAL HIGH (ref 11.5–15.5)
WBC: 6.8 10*3/uL (ref 4.0–10.5)

## 2011-07-04 LAB — APTT: aPTT: 33 seconds (ref 24–37)

## 2011-07-04 LAB — POCT I-STAT, CHEM 8
BUN: 10 mg/dL (ref 6–23)
Calcium, Ion: 1.21 mmol/L (ref 1.12–1.32)
Chloride: 102 mEq/L (ref 96–112)
Creatinine, Ser: 1.1 mg/dL (ref 0.50–1.10)
Glucose, Bld: 114 mg/dL — ABNORMAL HIGH (ref 70–99)
HCT: 35 % — ABNORMAL LOW (ref 36.0–46.0)
Hemoglobin: 11.9 g/dL — ABNORMAL LOW (ref 12.0–15.0)
Potassium: 3.3 mEq/L — ABNORMAL LOW (ref 3.5–5.1)
Sodium: 140 mEq/L (ref 135–145)
TCO2: 27 mmol/L (ref 0–100)

## 2011-07-04 LAB — PROTIME-INR
INR: 1.29 (ref 0.00–1.49)
Prothrombin Time: 16.3 seconds — ABNORMAL HIGH (ref 11.6–15.2)

## 2011-07-05 DIAGNOSIS — C189 Malignant neoplasm of colon, unspecified: Secondary | ICD-10-CM

## 2011-07-05 LAB — HEMOGLOBIN AND HEMATOCRIT, BLOOD
HCT: 26.8 % — ABNORMAL LOW (ref 36.0–46.0)
HCT: 27.6 % — ABNORMAL LOW (ref 36.0–46.0)
Hemoglobin: 8.8 g/dL — ABNORMAL LOW (ref 12.0–15.0)
Hemoglobin: 9 g/dL — ABNORMAL LOW (ref 12.0–15.0)

## 2011-07-05 LAB — CBC
HCT: 27.9 % — ABNORMAL LOW (ref 36.0–46.0)
Hemoglobin: 9 g/dL — ABNORMAL LOW (ref 12.0–15.0)
MCH: 21.7 pg — ABNORMAL LOW (ref 26.0–34.0)
MCHC: 32.3 g/dL (ref 30.0–36.0)
MCV: 67.4 fL — ABNORMAL LOW (ref 78.0–100.0)
Platelets: 176 10*3/uL (ref 150–400)
RBC: 4.14 MIL/uL (ref 3.87–5.11)
RDW: 17.1 % — ABNORMAL HIGH (ref 11.5–15.5)
WBC: 7.1 10*3/uL (ref 4.0–10.5)

## 2011-07-05 LAB — BASIC METABOLIC PANEL
BUN: 11 mg/dL (ref 6–23)
CO2: 28 mEq/L (ref 19–32)
Calcium: 9.2 mg/dL (ref 8.4–10.5)
Chloride: 104 mEq/L (ref 96–112)
Creatinine, Ser: 0.98 mg/dL (ref 0.50–1.10)
GFR calc Af Amer: >60 mL/min (ref >60)
GFR calc non Af Amer: >60 mL/min (ref >60)
Glucose, Bld: 113 mg/dL — ABNORMAL HIGH (ref 70–99)
Potassium: 3.2 mEq/L — ABNORMAL LOW (ref 3.5–5.1)
Sodium: 138 mEq/L (ref 135–145)

## 2011-07-05 LAB — PROTIME-INR
INR: 1.29 (ref 0.00–1.49)
Prothrombin Time: 16.3 seconds — ABNORMAL HIGH (ref 11.6–15.2)

## 2011-07-05 NOTE — H&P (Signed)
NAMEMarland Kitchen  UYEN, EICHHOLZ NO.:  [id number]  MEDICAL RECORD NO.:  [id number]  LOCATION:  1407                         FACILITY:  [facility]  PHYSICIAN:  Della Goo, M.D. DATE OF BIRTH:  1961-02-04  DATE OF ADMISSION:  07/04/2011 DATE OF DISCHARGE:                             HISTORY & PHYSICAL   DATE OF ADMISSION:  July 04, 2011  PRIMARY CARE PHYSICIAN:  Unassigned.  ONCOLOGIST:  Ladene Artist, M.D.  CHIEF COMPLAINT:  Swelling and pain, left upper arm.  HISTORY OF PRESENT ILLNESS:  This is a 50 year old female with a history of colon cancer which was diagnosed recently July 31st on colonoscopy after polyps were removed.  She has undergone 2 chemotherapy treatments so far.  The patient reports having swelling in her left upper arm with increased pain and some redness for the past 3 days.  She contacted her oncologist who was concerned that she may have a clot in her arm, who advised her to report to the emergency department for evaluation.  The patient was seen in the emergency department and an ultrasound study was performed which did find a deep venous thrombosis in the left upper extremity.  The patient was started on Lovenox injections and referred for medical admission.  She denied having any chest pain or any shortness of breath.  Her last chemotherapy treatment was on August 14th.  PAST MEDICAL HISTORY: 1. As mentioned above, colon cancer diagnosed June 10, 2011. 2. History of migraine headaches. 3. Gastroesophageal reflux disease.  PAST SURGICAL HISTORY:  Includes Port-A-Cath placement.  MEDICATIONS AT THIS TIME:  Iron, potassium.  ALLERGIES:  No known drug allergies.  SOCIAL HISTORY:  The patient is a nonsmoker, nondrinker.  No history of illicit drug usage.  FAMILY HISTORY:  Strongly positive for cancer in her maternal family, a maternal uncle with colon cancer, a maternal uncle with lung cancer, and a maternal uncle with prostate  cancer.  REVIEW OF SYSTEMS:  Pertinents are mentioned above.  PHYSICAL EXAMINATION FINDINGS:  GENERAL:  This is a 50 year old well- nourished, well-developed African American female who is in no acute distress. VITAL SIGNS:  Temperature 99.5, blood pressure 150/92, heart rate 97, respirations 16, O2 saturation is 100%. HEENT EXAMINATION:  Normocephalic, atraumatic.  Pupils equally round, reactive to light.  Extraocular movements are intact.  Funduscopic benign.  There is no scleral icterus.  Nares are patent bilaterally. Oropharynx is clear. NECK:  Supple, full range of motion.  No thyromegaly, adenopathy, or jugular venous distention. CARDIOVASCULAR:  Regular rate and rhythm.  No murmurs, gallops, or rubs. LUNGS:  Clear to auscultation bilaterally.  No rales, rhonchi, or wheezes. ABDOMEN:  Positive bowel sounds, soft, nontender, nondistended.  No hepatosplenomegaly. EXTREMITIES:  The left upper extremity does have 2+ edema and is painful to palpation in the triceps area.  There is also mild venous distention which can be seen superficially. NEUROLOGIC EXAMINATION:  Nonfocal.  LABORATORY STUDIES:  White blood cell count 6.8, hemoglobin 10.0, hematocrit 31.3, MCV 67.7, platelets were not listed, neutrophils 61%, lymphocytes 30%.  Prothrombin time 16.3, INR 1.29, and PTT 33.  Sodium 140, potassium 3.3, chloride 102, CO2 of 27, BUN 10, creatinine  1.10, and glucose 114.  Ionized calcium 1.21. Chest x-ray reveals clear lung fields, normal heart size.  No pneumothorax or pleural effusion.  Left- sided Port-A-Cath seen which has migrated medially with the port now projecting along the left heart border.  The catheter has backed out with the tip now in the left brachiocephalic vein which was noted on the report.  ASSESSMENT:  50 year old female being admitted with: 1. Deep venous thrombosis of the left upper extremity. 2. Microcytic anemia. 3. Mild hypokalemia. 4. History of colon  cancer, undergoing chemotherapy treatment.  PLAN:  The patient will be admitted to the telemetry area for monitoring.  She will be placed on continuous oxygen, and pulse oximetry has been ordered as well to monitor.  The patient has been started on Lovenox injections and the Coumadin protocol will also be initiated as well.  Oncology has been notified of the patient's admission and will see the patient in the a.m.  The patient is a full code and further workup will ensue pending results of the patient's clinical course.  Her electrolytes will be corrected, and her medications will be further reconciled.     Della Goo, M.D.     HJ/MEDQ  D:  07/05/2011  T:  07/05/2011  Job:  [id number]  Electronically Signed by Della Goo M.D. on 07/05/2011 10:34:51 PM

## 2011-07-06 LAB — BASIC METABOLIC PANEL
BUN: 7 mg/dL (ref 6–23)
CO2: 27 mEq/L (ref 19–32)
Calcium: 8.9 mg/dL (ref 8.4–10.5)
Chloride: 103 mEq/L (ref 96–112)
Creatinine, Ser: 1.01 mg/dL (ref 0.50–1.10)
GFR calc Af Amer: >60 mL/min (ref >60)
GFR calc non Af Amer: 58 mL/min — ABNORMAL LOW (ref >60)
Glucose, Bld: 106 mg/dL — ABNORMAL HIGH (ref 70–99)
Potassium: 3.2 mEq/L — ABNORMAL LOW (ref 3.5–5.1)
Sodium: 136 mEq/L (ref 135–145)

## 2011-07-06 LAB — CBC
HCT: 27.5 % — ABNORMAL LOW (ref 36.0–46.0)
Hemoglobin: 9.1 g/dL — ABNORMAL LOW (ref 12.0–15.0)
MCH: 22.1 pg — ABNORMAL LOW (ref 26.0–34.0)
MCHC: 33.1 g/dL (ref 30.0–36.0)
MCV: 66.9 fL — ABNORMAL LOW (ref 78.0–100.0)
Platelets: 179 10*3/uL (ref 150–400)
RBC: 4.11 MIL/uL (ref 3.87–5.11)
RDW: 16.9 % — ABNORMAL HIGH (ref 11.5–15.5)
WBC: 6.1 10*3/uL (ref 4.0–10.5)

## 2011-07-06 LAB — HEMOGLOBIN AND HEMATOCRIT, BLOOD
HCT: 29.4 % — ABNORMAL LOW (ref 36.0–46.0)
Hemoglobin: 9.4 g/dL — ABNORMAL LOW (ref 12.0–15.0)

## 2011-07-06 LAB — PROTIME-INR
INR: 1.65 — ABNORMAL HIGH (ref 0.00–1.49)
Prothrombin Time: 19.8 seconds — ABNORMAL HIGH (ref 11.6–15.2)

## 2011-07-07 DIAGNOSIS — C189 Malignant neoplasm of colon, unspecified: Secondary | ICD-10-CM

## 2011-07-07 DIAGNOSIS — I8000 Phlebitis and thrombophlebitis of superficial vessels of unspecified lower extremity: Secondary | ICD-10-CM

## 2011-07-07 LAB — CBC
HCT: 28.2 % — ABNORMAL LOW (ref 36.0–46.0)
Hemoglobin: 9.2 g/dL — ABNORMAL LOW (ref 12.0–15.0)
MCH: 21.9 pg — ABNORMAL LOW (ref 26.0–34.0)
MCHC: 32.6 g/dL (ref 30.0–36.0)
MCV: 67.1 fL — ABNORMAL LOW (ref 78.0–100.0)
Platelets: 162 10*3/uL (ref 150–400)
RBC: 4.2 MIL/uL (ref 3.87–5.11)
RDW: 17.2 % — ABNORMAL HIGH (ref 11.5–15.5)
WBC: 6 10*3/uL (ref 4.0–10.5)

## 2011-07-07 LAB — HEMOGLOBIN AND HEMATOCRIT, BLOOD
HCT: 28.2 % — ABNORMAL LOW (ref 36.0–46.0)
Hemoglobin: 9 g/dL — ABNORMAL LOW (ref 12.0–15.0)

## 2011-07-07 LAB — PROTIME-INR
INR: 2.34 — ABNORMAL HIGH (ref 0.00–1.49)
Prothrombin Time: 26 seconds — ABNORMAL HIGH (ref 11.6–15.2)

## 2011-07-08 ENCOUNTER — Encounter (HOSPITAL_BASED_OUTPATIENT_CLINIC_OR_DEPARTMENT_OTHER): Payer: BC Managed Care – PPO | Admitting: Oncology

## 2011-07-08 DIAGNOSIS — A048 Other specified bacterial intestinal infections: Secondary | ICD-10-CM

## 2011-07-08 DIAGNOSIS — D126 Benign neoplasm of colon, unspecified: Secondary | ICD-10-CM

## 2011-07-08 DIAGNOSIS — K7689 Other specified diseases of liver: Secondary | ICD-10-CM

## 2011-07-08 DIAGNOSIS — D509 Iron deficiency anemia, unspecified: Secondary | ICD-10-CM

## 2011-07-10 ENCOUNTER — Encounter (HOSPITAL_COMMUNITY)
Admission: RE | Admit: 2011-07-10 | Discharge: 2011-07-10 | Disposition: A | Discharge status: Home / Self Care | Payer: BC Managed Care – PPO | Source: Ambulatory Visit | Attending: General Surgery | Admitting: General Surgery

## 2011-07-10 ENCOUNTER — Other Ambulatory Visit (INDEPENDENT_AMBULATORY_CARE_PROVIDER_SITE_OTHER): Payer: Self-pay | Admitting: General Surgery

## 2011-07-10 ENCOUNTER — Ambulatory Visit (HOSPITAL_COMMUNITY)
Admission: RE | Admit: 2011-07-10 | Discharge: 2011-07-10 | Disposition: A | Discharge status: Home / Self Care | Payer: BC Managed Care – PPO | Source: Ambulatory Visit | Attending: General Surgery | Admitting: General Surgery

## 2011-07-10 DIAGNOSIS — C189 Malignant neoplasm of colon, unspecified: Secondary | ICD-10-CM

## 2011-07-10 DIAGNOSIS — Z01818 Encounter for other preprocedural examination: Secondary | ICD-10-CM | POA: Insufficient documentation

## 2011-07-10 LAB — SURGICAL PCR SCREEN
MRSA, PCR: NEGATIVE
Staphylococcus aureus: NEGATIVE

## 2011-07-17 ENCOUNTER — Other Ambulatory Visit (INDEPENDENT_AMBULATORY_CARE_PROVIDER_SITE_OTHER): Payer: Self-pay | Admitting: General Surgery

## 2011-07-17 ENCOUNTER — Ambulatory Visit (HOSPITAL_COMMUNITY)
Admission: RE | Admit: 2011-07-17 | Discharge: 2011-07-17 | Disposition: A | Discharge status: Home / Self Care | Payer: BC Managed Care – PPO | Source: Ambulatory Visit | Attending: General Surgery | Admitting: General Surgery

## 2011-07-17 DIAGNOSIS — Z452 Encounter for adjustment and management of vascular access device: Secondary | ICD-10-CM | POA: Insufficient documentation

## 2011-07-17 DIAGNOSIS — Z9049 Acquired absence of other specified parts of digestive tract: Secondary | ICD-10-CM | POA: Insufficient documentation

## 2011-07-17 DIAGNOSIS — C801 Malignant (primary) neoplasm, unspecified: Secondary | ICD-10-CM | POA: Insufficient documentation

## 2011-07-17 DIAGNOSIS — Z01812 Encounter for preprocedural laboratory examination: Secondary | ICD-10-CM | POA: Insufficient documentation

## 2011-07-17 DIAGNOSIS — R52 Pain, unspecified: Secondary | ICD-10-CM

## 2011-07-17 DIAGNOSIS — C189 Malignant neoplasm of colon, unspecified: Secondary | ICD-10-CM

## 2011-07-17 DIAGNOSIS — Z01818 Encounter for other preprocedural examination: Secondary | ICD-10-CM | POA: Insufficient documentation

## 2011-07-18 LAB — APTT: aPTT: 30 seconds (ref 24–37)

## 2011-07-18 LAB — POCT I-STAT 4, (NA,K, GLUC, HGB,HCT)
Glucose, Bld: 100 mg/dL — ABNORMAL HIGH (ref 70–99)
HCT: 36 % (ref 36.0–46.0)
Hemoglobin: 12.2 g/dL (ref 12.0–15.0)
Potassium: 4.1 mEq/L (ref 3.5–5.1)
Sodium: 140 mEq/L (ref 135–145)

## 2011-07-18 LAB — PROTIME-INR
INR: 1.03 (ref 0.00–1.49)
Prothrombin Time: 13.7 seconds (ref 11.6–15.2)

## 2011-07-19 ENCOUNTER — Emergency Department (HOSPITAL_COMMUNITY): Payer: BC Managed Care – PPO

## 2011-07-19 ENCOUNTER — Inpatient Hospital Stay (HOSPITAL_COMMUNITY): Payer: BC Managed Care – PPO

## 2011-07-19 ENCOUNTER — Inpatient Hospital Stay (HOSPITAL_COMMUNITY)
Admission: EM | Admit: 2011-07-19 | Discharge: 2011-07-22 | DRG: 094 | Disposition: A | Discharge status: Home / Self Care | Payer: BC Managed Care – PPO | Attending: General Surgery | Admitting: General Surgery

## 2011-07-19 DIAGNOSIS — Z9049 Acquired absence of other specified parts of digestive tract: Secondary | ICD-10-CM

## 2011-07-19 DIAGNOSIS — R0602 Shortness of breath: Secondary | ICD-10-CM

## 2011-07-19 DIAGNOSIS — Z7901 Long term (current) use of anticoagulants: Secondary | ICD-10-CM

## 2011-07-19 DIAGNOSIS — Y840 Cardiac catheterization as the cause of abnormal reaction of the patient, or of later complication, without mention of misadventure at the time of the procedure: Secondary | ICD-10-CM | POA: Diagnosis present

## 2011-07-19 DIAGNOSIS — D509 Iron deficiency anemia, unspecified: Secondary | ICD-10-CM | POA: Diagnosis present

## 2011-07-19 DIAGNOSIS — J95811 Postprocedural pneumothorax: Secondary | ICD-10-CM

## 2011-07-19 DIAGNOSIS — I82890 Acute embolism and thrombosis of other specified veins: Secondary | ICD-10-CM | POA: Diagnosis present

## 2011-07-19 DIAGNOSIS — C189 Malignant neoplasm of colon, unspecified: Secondary | ICD-10-CM | POA: Diagnosis present

## 2011-07-19 LAB — COMPREHENSIVE METABOLIC PANEL
ALT: 61 U/L — ABNORMAL HIGH (ref 0–35)
AST: 36 U/L (ref 0–37)
Albumin: 3.5 g/dL (ref 3.5–5.2)
Alkaline Phosphatase: 103 U/L (ref 39–117)
BUN: 11 mg/dL (ref 6–23)
CO2: 28 mEq/L (ref 19–32)
Calcium: 9.7 mg/dL (ref 8.4–10.5)
Chloride: 98 mEq/L (ref 96–112)
Creatinine, Ser: 0.97 mg/dL (ref 0.50–1.10)
GFR calc Af Amer: >60 mL/min (ref >60)
GFR calc non Af Amer: >60 mL/min (ref >60)
Glucose, Bld: 103 mg/dL — ABNORMAL HIGH (ref 70–99)
Potassium: 3.9 mEq/L (ref 3.5–5.1)
Sodium: 134 mEq/L — ABNORMAL LOW (ref 135–145)
Total Bilirubin: 0.4 mg/dL (ref 0.3–1.2)
Total Protein: 7.3 g/dL (ref 6.0–8.3)

## 2011-07-19 LAB — CBC
HCT: 30.2 % — ABNORMAL LOW (ref 36.0–46.0)
Hemoglobin: 9.8 g/dL — ABNORMAL LOW (ref 12.0–15.0)
MCH: 22 pg — ABNORMAL LOW (ref 26.0–34.0)
MCHC: 32.5 g/dL (ref 30.0–36.0)
MCV: 67.7 fL — ABNORMAL LOW (ref 78.0–100.0)
Platelets: 165 10*3/uL (ref 150–400)
RBC: 4.46 MIL/uL (ref 3.87–5.11)
RDW: 18.5 % — ABNORMAL HIGH (ref 11.5–15.5)
WBC: 7.2 10*3/uL (ref 4.0–10.5)

## 2011-07-19 LAB — PROTIME-INR
INR: 1.13 (ref 0.00–1.49)
Prothrombin Time: 14.7 seconds (ref 11.6–15.2)

## 2011-07-19 LAB — DIFFERENTIAL
Basophils Absolute: 0 10*3/uL (ref 0.0–0.1)
Basophils Relative: 0 % (ref 0–1)
Eosinophils Absolute: 0.3 10*3/uL (ref 0.0–0.7)
Eosinophils Relative: 4 % (ref 0–5)
Lymphocytes Relative: 34 % (ref 12–46)
Lymphs Abs: 2.4 10*3/uL (ref 0.7–4.0)
Monocytes Absolute: 0.6 10*3/uL (ref 0.1–1.0)
Monocytes Relative: 9 % (ref 3–12)
Neutro Abs: 3.9 10*3/uL (ref 1.7–7.7)
Neutrophils Relative %: 53 % (ref 43–77)

## 2011-07-20 ENCOUNTER — Inpatient Hospital Stay (HOSPITAL_COMMUNITY): Payer: BC Managed Care – PPO

## 2011-07-20 HISTORY — PX: PORTACATH PLACEMENT: SHX2246

## 2011-07-21 ENCOUNTER — Inpatient Hospital Stay (HOSPITAL_COMMUNITY): Payer: BC Managed Care – PPO

## 2011-07-21 NOTE — Discharge Summary (Signed)
  NAMEMarland Kitchen  Mary [name], Mary [name] NO.:  [id number]  MEDICAL RECORD NO.:  [id number]  LOCATION:  1407                         [facility]ACILITY[facility]  [facility]  PHYSICIAN:  Kathlen Mody, MD       DATE OF BIRTH:  January 29, 1961  DATE OF ADMISSION:  07/04/2011 DATE OF DISCHARGE:  07/07/2011                              DISCHARGE SUMMARY   ONCOLOGIST:  Leighton Roach. Truett Perna, M.D.  DISCHARGE DIAGNOSES: 1. Left upper extremity deep venous thrombosis. 2. Iron deficiency anemia. 3. Hypokalemia. 4. Colon cancer. 5. Inaccessibility of the Port-a-Cath.  DISCHARGE MEDICATIONS: 1. Tylenol 650 q.4 h. as needed. 2. Lovenox 150 mg q.24 h. 3. Dulcolax p.r.n. 4. Ferrous sulfate twice a day. 5. Potassium chloride 20 mEq daily. 6. Prilosec 20 mg daily.  PERTINENT LABORATORY DATA:  The patient had i-STAT Chem-8 done, which showed a hemoglobin of 11.9, potassium of 3.3, INR of 1.29.  CBC showed a hemoglobin of 10 with hematocrit of 31; on the day of discharge, hemoglobin is 9, hematocrit is 28.2, INR is 2.34.  Basic metabolic panel showed a potassium of 3.2.  DIAGNOSTIC STUDIES:  The patient had a 2-view chest x-ray, which showed a left-sided Port-a-Cath migrated medially with port now projecting along the left heart border.  No acute cardiopulmonary disease.  Venous duplex showing left upper extremity DVT.  BRIEF HOSPITAL COURSE:  This is a 50 year old lady with a history of colon cancer, admitted for swelling and pain of the left upper arm and venous duplex was done while in the ER and she was found to have DVT of the left upper extremity.  She was started on Lovenox injections and Coumadin simultaneously.  On the day of discharge, the patient was discharged on Lovenox with a therapeutic INR. 1. Colon cancer.  Hematology/Oncology consult from Dr. Truett Perna was     obtained who recommended continuing the Lovenox and chemotherapy as     per the chemotherapy scheduled as an outpatient. 2. Anemia.  Iron  deficiency anemia, on iron supplements. 3. Hypokalemia, on potassium supplements. 4. Inaccessibility of the left Port-a-Cath.  The patient's left Port-a-     Cath was inaccessible and surgery consult was obtained who     recommended discharging the patient home and to follow up as an     outpatient for revision and to follow up as outpatient with Dr.     Lindie Spruce.  On the day of discharge, the patient's vitals were within normal limits. Her exam showed mild left upper extremity swelling, but the pain is controlled.  She is discharged home in stable condition and recommended to follow up with Dr. Truett Perna in the morning and follow with Dr. Lindie Spruce for revision of the left Port-a-Cath.          ______________________________ Kathlen Mody, MD     VA/MEDQ  D:  07/20/2011  T:  07/21/2011  Job:  [id number]  Electronically Signed by Kathlen Mody MD on 07/21/2011 02:07:35 PM

## 2011-07-21 NOTE — Op Note (Addendum)
NAME:  Mary [name], Mary [name] NO.:  [id number]  MEDICAL RECORD NO.:  [id number]  LOCATION:                               FACILITY:  [facility]  PHYSICIAN:  Cherylynn Ridges, M.D.    DATE OF BIRTH:  10-08-1961  DATE OF PROCEDURE:  07/17/2011 DATE OF DISCHARGE:                              OPERATIVE REPORT   PREOPERATIVE DIAGNOSIS:  Metastatic colon cancer with previous left subclavian Port-A-Cath in place, which is nonfunctional.  POSTOPERATIVE DIAGNOSIS:  Metastatic colon cancer with previous left subclavian Port-A-Cath in place, which is nonfunctional with likely clotted innominate vein.  PROCEDURE:  Removal of left subclavian Port-A-Cath and placement of right subclavian Port-A-Cath PowerPort, reference number [id number].  SURGEON:  Cherylynn Ridges, MD  ANESTHESIA:  General with laryngeal airway.  ESTIMATED BLOOD LOSS:  Maybe 100 mL.  COMPLICATIONS:  Possible left pneumothorax with air retrieval on one passage of the soft catheter and attempt to get in on the left side. Condition is stable.  Chest x-ray is pending.  INDICATIONS FOR OPERATION:  The patient is a 50 year old woman who recently was diagnosed with metastatic colon cancer, had a colectomy, Port-A-Cath placed.  There was difficulty in passing a Port-A-Cath initially and it pulled back into the innominate vein.  Probably during the treatment, the vein clotted off, the catheters began not to function.  X-rays demonstrated that it pulled back into the innominate and therefore it needed to be replaced at that time.  OPERATION:  The patient was taken to the operating room, placed on table in supine position.  After general laryngeal airway anesthetic was administered, she was prepped and draped in usual sterile manner exposing both sides.  After proper time-out was performed identifying the patient and procedure to be performed, we went ahead and attempted to aspirate blood from the port on the left side using a  Hubbard needle.  Upon accessing the port adequately, we got back some clot, but no actual blood flow. We did not attempt to flush it as there may have been a clot at the tip of catheter that had been broken off.  We subsequently cut down at the previous subclavian site and accessed the catheter there.  We looped it a little bit and then cut across it keeping air from flowing into the distal limb and then attempted to aspirate fluid from the distal end of the catheter using an adapter and syringe.  Very small amount of blood came back, but there was no free flow and therefore we removed the catheter.  Multiple attempts were made to try to access the subclavian vein on the left side and we were actually able to access the vein.  When we did so, we passed a wire into the vein, but there was some obstruction after about 10-15 cm down the actual catheter.  Eventually the wire went all the way through the cath and on fluoro it demonstrated that it bumped off some type of obstruction and went up into the left internal jugular vein.  On multiple attempts under fluoro, we tried to reposition the wire so that they would go down past this obstructive process and innominate vein  into the distal innominate and superior vena cava; however, we could not get it to pass distally, therefore an attempt was no longer made to access the vein on this side, removed all catheters, although we did attempt a one-time to pass a catheter over the wire into the system and in the process we were able to aspirate some air, possibly pneumothorax on that side from one of the sticks or the catheter itself.  The decision was made to move to the other side.  We closed off the pocket after removing the port and the proximal portion of the catheter with minimal difficulty.  Once we removed the port and the catheter, we closed off that site using 3-0 Vicryl subcu and then skin closed using running subcuticular stitch of  Monocryl.  Dermabond, Steri-Strips, and Tegaderm were used to complete the dressing on the left side.  All of this was done at the end of the case after we had completely placed a catheter on the right side.  On the right side, we prepped and draped, it had been prepped prior to access on the left side.  We accessed right subclavian vein after multiple sticks, none of which retrieved air; however, the angulation needed to get into the vein was more than 45 degrees more like 50, it is 55 degrees, but there was flow of venous blood.  We were able to access with the wire demonstrated that it went down into the superior vena cava using fluoro.  We subsequently made a pocket just underneath a large tattoo that the patient had on the right chest wall extending onto the top of the right breast.  The incision for the port was just underneath the tattoo and did not go into the tattoo at all.  We made a pocket, tunneled the catheter from the pocket up to the subclavian area, and subsequently passed a flush catheter through the dilator and introducer into the deep venous system.  Once we had adequate access as demonstrated by fluoro we broke the introducer, pulled it back along the catheter, and then under fluoro pulled the catheter back into the right atrium.  We left it deep into the right atrium as they tend to fall back towards the weight of the breast and did not want to pull it back into the proximal superior vena caval or the subclavian area on that side.  Once we had the catheter in place, we cut it to the proper length.  We placed 3-0 Vicryl in the pocket in order to curing the port.  We attached a catheter to the port using a locking cap and then once that was done, we secured the port and the pocket using the 3-0 Vicryl, which had previously been placed.  Once this was done, we accessed the port through the skin and found it to be good flow.  Once we demonstrated good flow, we washed  out areas with antibiotic solution and then began to close.  We closed the right side as we closed the left side with 3-0 Vicryl in the subcu and then 4-0 Monocryl for the subcuticular. Dermabond, Steri-Strips, and Tegaderm was used complete the coverage of the wound.  We did access the port using an angled Hubbard needle attached to the catheter and then flushed with 3 mL of 1000 unit/mL of heparinized solution.  We then clamped off the port and covered it with Tegaderm.  All counts were correct.  The chest x-ray is pending in recovery  room.     Cherylynn Ridges, M.D.     JOW/MEDQ  D:  07/17/2011  T:  07/17/2011  Job:  [id number]  Electronically Signed by Jimmye Norman M.D. on 07/31/2011 04:44:04 AM

## 2011-07-22 ENCOUNTER — Encounter (HOSPITAL_BASED_OUTPATIENT_CLINIC_OR_DEPARTMENT_OTHER): Payer: BC Managed Care – PPO | Admitting: Oncology

## 2011-07-22 ENCOUNTER — Inpatient Hospital Stay (HOSPITAL_COMMUNITY): Payer: BC Managed Care – PPO

## 2011-07-22 DIAGNOSIS — D126 Benign neoplasm of colon, unspecified: Secondary | ICD-10-CM

## 2011-07-22 DIAGNOSIS — Z5111 Encounter for antineoplastic chemotherapy: Secondary | ICD-10-CM

## 2011-07-22 DIAGNOSIS — C189 Malignant neoplasm of colon, unspecified: Secondary | ICD-10-CM

## 2011-07-22 DIAGNOSIS — I8000 Phlebitis and thrombophlebitis of superficial vessels of unspecified lower extremity: Secondary | ICD-10-CM

## 2011-07-23 NOTE — H&P (Signed)
  NAMEMarland Kitchen  KOYA, HUNGER NO.:  [id number]  MEDICAL RECORD NO.:  [id number]  LOCATION:  1524                         FACILITY:  [facility]  PHYSICIAN:  Thornton Park. Daphine Deutscher, MD  DATE OF BIRTH:  1960/12/06  DATE OF ADMISSION:  07/19/2011 DATE OF DISCHARGE:                             HISTORY & PHYSICAL   HISTORY OF PRESENT ILLNESS:  Mary [name] is a 50 year old lady who has recently been found to have metastatic mucinous colon cancer with some difficulty maintaining a Port-A-Cath.  She had had one placed earlier that was dislodged and she underwent a repeat Port-A-Cath placement on 07/17/2011.  Apparently there was a left-sided Port-A-Cath that was removed by Dr. Lindie Spruce and attempts were made to replace it on that left side and this was unsuccessful.  A right side Port-A-Cath was subsequently placed.  The patient began complaining of some shortness of breath when she was lying supine.  Apparently she had called Dr. Dalene Carrow who referred her to the [facility] Emergency Room.  Chest x-rays were obtained which showed bilateral pneumothoraces.  PAST MEDICAL HISTORY:  Remarkable for this history of mucinous metastatic colon cancer, GERD, hypokalemia, microcytic anemia, history of migraines.  OTHER SURGICAL HISTORY:  Includes a hysterectomy.  SOCIAL HISTORY:  No history of drug abuse.  No drinking.  No smoking.  MEDICATIONS:  Include Lovenox 150 mg daily, Prilosec as needed, ferrous sulfate b.i.d., and Dulcolax.  PHYSICAL EXAM:  GENERAL:  Pleasant lady sitting upright in no acute distress and no obvious increased work of breathing with nasal oxygen placed.  VITAL SIGNS:  Afebrile.  Blood pressure 144/89, pulse 82, respirations 20. HEENT:  Unremarkable. NECK:  Supple. CHEST:  Diminished breath sounds, more so on the left than the right. No rubs heard. HEART:  Sinus rhythm without murmurs. ABDOMEN:  Nontender.  Incisions are healed. EXTREMITIES:  Full range of  motion. NEUROLOGIC:  Alert and  oriented x3.  Motor and sensory functions grossly intact.  IMPRESSION:  Bilateral pneumothoraces, status post Port-A-Cath placement.  PLAN:  Admit with nasal oxygen.  Observation to see if these will resorb.  If not, the patient may need percutaneous aspiration or tube thoracostomy.     Thornton Park Daphine Deutscher, MD     MBM/MEDQ  D:  07/19/2011  T:  07/19/2011  Job:  [id number]  cc:   Ladene Artist, M.D. Fax: [phone]  Cherylynn Ridges, M.D. [address], [address] [place] [address]  Electronically Signed by Luretha Murphy MD on 07/23/2011 07:40:52 AM

## 2011-07-24 ENCOUNTER — Encounter (HOSPITAL_BASED_OUTPATIENT_CLINIC_OR_DEPARTMENT_OTHER): Payer: BC Managed Care – PPO | Admitting: Oncology

## 2011-07-24 ENCOUNTER — Other Ambulatory Visit: Payer: Self-pay | Admitting: Oncology

## 2011-07-24 DIAGNOSIS — D509 Iron deficiency anemia, unspecified: Secondary | ICD-10-CM

## 2011-07-24 DIAGNOSIS — D126 Benign neoplasm of colon, unspecified: Secondary | ICD-10-CM

## 2011-07-24 DIAGNOSIS — Z5111 Encounter for antineoplastic chemotherapy: Secondary | ICD-10-CM

## 2011-07-24 DIAGNOSIS — K7689 Other specified diseases of liver: Secondary | ICD-10-CM

## 2011-07-24 LAB — COMPREHENSIVE METABOLIC PANEL
ALT: 29 U/L (ref 0–35)
AST: 19 U/L (ref 0–37)
Albumin: 3.6 g/dL (ref 3.5–5.2)
Alkaline Phosphatase: 97 U/L (ref 39–117)
BUN: 25 mg/dL — ABNORMAL HIGH (ref 6–23)
CO2: 27 mEq/L (ref 19–32)
Calcium: 9.7 mg/dL (ref 8.4–10.5)
Chloride: 98 mEq/L (ref 96–112)
Creatinine, Ser: 1.16 mg/dL — ABNORMAL HIGH (ref 0.50–1.10)
Glucose, Bld: 134 mg/dL — ABNORMAL HIGH (ref 70–99)
Potassium: 3.4 mEq/L — ABNORMAL LOW (ref 3.5–5.3)
Sodium: 134 mEq/L — ABNORMAL LOW (ref 135–145)
Total Bilirubin: 0.6 mg/dL (ref 0.3–1.2)
Total Protein: 7.5 g/dL (ref 6.0–8.3)

## 2011-07-24 LAB — CBC WITH DIFFERENTIAL/PLATELET
BASO%: 0.2 % (ref 0.0–2.0)
Basophils Absolute: 0 10*3/uL (ref 0.0–0.1)
EOS%: 2 % (ref 0.0–7.0)
Eosinophils Absolute: 0.1 10*3/uL (ref 0.0–0.5)
HCT: 30.1 % — ABNORMAL LOW (ref 34.8–46.6)
HGB: 9.9 g/dL — ABNORMAL LOW (ref 11.6–15.9)
LYMPH%: 16.8 % (ref 14.0–49.7)
MCH: 22.9 pg — ABNORMAL LOW (ref 25.1–34.0)
MCHC: 32.8 g/dL (ref 31.5–36.0)
MCV: 70 fL — ABNORMAL LOW (ref 79.5–101.0)
MONO#: 0.2 10*3/uL (ref 0.1–0.9)
MONO%: 2.7 % (ref 0.0–14.0)
NEUT#: 5.1 10*3/uL (ref 1.5–6.5)
NEUT%: 78.3 % — ABNORMAL HIGH (ref 38.4–76.8)
Platelets: 146 10*3/uL (ref 145–400)
RBC: 4.3 10*6/uL (ref 3.70–5.45)
RDW: 19.9 % — ABNORMAL HIGH (ref 11.2–14.5)
WBC: 6.5 10*3/uL (ref 3.9–10.3)
lymph#: 1.1 10*3/uL (ref 0.9–3.3)

## 2011-07-24 LAB — PROTHROMBIN TIME
INR: 1.23 (ref <1.50)
Prothrombin Time: 15.8 seconds — ABNORMAL HIGH (ref 11.6–15.2)

## 2011-07-25 ENCOUNTER — Other Ambulatory Visit: Payer: Self-pay | Admitting: Oncology

## 2011-07-25 ENCOUNTER — Encounter (HOSPITAL_BASED_OUTPATIENT_CLINIC_OR_DEPARTMENT_OTHER): Payer: BC Managed Care – PPO | Admitting: Oncology

## 2011-07-25 DIAGNOSIS — K7689 Other specified diseases of liver: Secondary | ICD-10-CM

## 2011-07-25 DIAGNOSIS — D509 Iron deficiency anemia, unspecified: Secondary | ICD-10-CM

## 2011-07-25 DIAGNOSIS — D126 Benign neoplasm of colon, unspecified: Secondary | ICD-10-CM

## 2011-07-25 DIAGNOSIS — Z5111 Encounter for antineoplastic chemotherapy: Secondary | ICD-10-CM

## 2011-07-25 LAB — PROTIME-INR
INR: 1.6 — ABNORMAL LOW (ref 2.00–3.50)
Protime: 19.2 Seconds — ABNORMAL HIGH (ref 10.6–13.4)

## 2011-07-28 ENCOUNTER — Other Ambulatory Visit: Payer: Self-pay | Admitting: Oncology

## 2011-07-28 ENCOUNTER — Encounter (HOSPITAL_BASED_OUTPATIENT_CLINIC_OR_DEPARTMENT_OTHER): Payer: BC Managed Care – PPO | Admitting: Oncology

## 2011-07-28 DIAGNOSIS — D509 Iron deficiency anemia, unspecified: Secondary | ICD-10-CM

## 2011-07-28 DIAGNOSIS — K7689 Other specified diseases of liver: Secondary | ICD-10-CM

## 2011-07-28 DIAGNOSIS — Z5111 Encounter for antineoplastic chemotherapy: Secondary | ICD-10-CM

## 2011-07-28 DIAGNOSIS — D126 Benign neoplasm of colon, unspecified: Secondary | ICD-10-CM

## 2011-07-28 LAB — PROTIME-INR
INR: 2.8 (ref 2.00–3.50)
Protime: 33.6 Seconds — ABNORMAL HIGH (ref 10.6–13.4)

## 2011-07-29 ENCOUNTER — Encounter (INDEPENDENT_AMBULATORY_CARE_PROVIDER_SITE_OTHER): Payer: Self-pay | Admitting: General Surgery

## 2011-07-29 ENCOUNTER — Ambulatory Visit (INDEPENDENT_AMBULATORY_CARE_PROVIDER_SITE_OTHER): Payer: PRIVATE HEALTH INSURANCE | Admitting: General Surgery

## 2011-07-29 VITALS — BP 126/88 | HR 60 | Temp 97.0°F | Resp 14

## 2011-07-29 DIAGNOSIS — Z09 Encounter for follow-up examination after completed treatment for conditions other than malignant neoplasm: Secondary | ICD-10-CM

## 2011-07-29 NOTE — Progress Notes (Signed)
HPI Status post port-a-Cath placemnt  PE Clear breath sound bilaterally.  Hematoma around right Port-a-Cath site, functioning well by report  Studiy review None.  Last CXR 07-22-11, no PTX.  Assessment Doing well  Plan See again prn after chemoRx finished to consider removal of current catheter.

## 2011-07-31 NOTE — Discharge Summary (Signed)
NAMEMarland Kitchen  Mary [name], Mary [name] NO.:  [id number]  MEDICAL RECORD NO.:  [id number]  LOCATION:  1524                         FACILITY:  [facility]  PHYSICIAN:  Clovis Pu. Artrice Kraker, M.D.DATE OF BIRTH:  June 12, 1961  DATE OF ADMISSION:  07/19/2011 DATE OF DISCHARGE:                              DISCHARGE SUMMARY   DATE OF EXPECTED DISCHARGE:  July 22, 2011  ADMISSION DIAGNOSES: 1. Bilateral apical pneumothorax, status post Port-a-Cath placement. 2. Status post failed left subclavian Port-a-Cath with multiple attempts to replace     on the left and then subsequent right subclavian Port-a-Cath     placement on July 17, 2011. 3. Stage III well-differentiated metastatic colon cancer, status post     sigmoid colectomy with mesenteric lymph node biopsy on May 13, 2011. 4. Left upper extremity deep venous thrombosis, hospitalized August     24th through August 27, now on Lovenox. 5. Iron deficiency anemia. 6. Left upper extremity deep venous thrombosis with failed Port-a-Cath     on the left.  DISCHARGE DIAGNOSES: 1. Bilateral apical pneumothorax, status post Port-a-Cath placement. 2. Status post failed left subclavian Port-a-Cath with multiple attempts to replace      on the left and then subsequent right subclavian Port-a-Cath     placement on July 17, 2011. 3. Stage III well-differentiated metastatic colon cancer, status post     sigmoid colectomy with mesenteric lymph node biopsy on May 13, 2011. 4. Left upper extremity deep venous thrombosis, hospitalized August     24th through August 27, now on Lovenox. 5. Iron deficiency anemia. 6. Left upper extremity deep venous thrombosis with failed Port-a-Cath     on the left.  PROCEDURES:  None.  BRIEF HISTORY:  The patient is a 50 year old female who was found to have metastatic colon cancer in July 2012.  She underwent a sigmoid colectomy with lymph node biopsy and then had a left Port-a-Cath placed on June 02, 2011.  She returned and was admitted on July 04, 2011, with DVT in the left upper extremity.  On July 17, 2011, she underwent removal of left subclavian and attempted placement of a new left subclavian catheter, which failed and then had a right subclavian Port-a-Cath PowerPort placed by Dr. Lindie Spruce.  After discharge, she call Dr. Dalene Carrow on July 19, 2011, complaining of shortness of breath when she was supine.  Dr. Dalene Carrow sent her to the ER where chest x-ray showed bilateral apical pneumothoraces.  Both were approximately 15%.  The patient was subsequently admitted for further evaluation and treatment as indicated.  PAST MEDICAL HISTORY: 1. Positive for mucinous metastatic colon cancer. 2. GERD. 3. Hypokalemia. 4. Microcytic anemia. 5. History of migraines.  PAST SURGICAL HISTORY: 1. Sigmoid colectomy with lymph node biopsy on May 13, 2011. 2. History of prior hysterectomy.  SOCIAL HISTORY:  No drugs, tobacco, or alcohol.  ADMISSION MEDICATIONS: 1. Lovenox 150 mg daily. 2. Prilosec p.r.n. 3. Ferrous sulfate 1 b.i.d. 4. Dulcolax p.r.n.  For further history and physical, please see the dictated note.  HOSPITAL COURSE:  The patient was admitted, initial x-ray showed bilateral pneumothoraces, about 15%.  The patient  was admitted, placed on observation.  She was given O2 through nasal cannula to supplement her pO2.  She showed progress, her pneumothorax has remained stable. This was reviewed by Dr. Luisa Hart.  He did not feel that she was any worse off and the chest x-ray shows some mild improvement.  It was his opinion that if she continued to do well and chest x-ray was no worse, she could be discharged home tomorrow on July 22, 2011.  She is scheduled for chemotherapy in the morning at the oncology center, Dr. Truett Perna has been contacted and he will review things and decide on whether to try to treat her here in hospital or as an outpatient Tuesday or  Wednesday, September 11th or September 12th.  We will resume her preadmission medicines as before including Tylenol p.r.n., Dulcolax p.r.n., ferrous sulfate 1 b.i.d., Lovenox 150 mg q.24, potassium chloride 20 mEq daily, and Prilosec 20 mg daily.  She will return to see Dr. Lindie Spruce on July 29, 2011, at noon.  We are going to send her at 10:30 to get a chest x-ray at [facility].  Follow up with Dr. Truett Perna and her chemotherapy as instructed.  CONDITION ON DISCHARGE:  Improved.     Eber Hong, P.A.   ______________________________ Clovis Pu Henrique Parekh, M.D.    WDJ/MEDQ  D:  07/21/2011  T:  07/22/2011  Job:  [id number]  cc:   Ladene Artist, M.D. Fax: [phone]  Electronically Signed by Sherrie George P.A. on 07/28/2011 04:11:27 PM Electronically Signed by Harriette Bouillon M.D. on 07/31/2011 11:91:47 AM

## 2011-08-01 ENCOUNTER — Other Ambulatory Visit: Payer: Self-pay | Admitting: Oncology

## 2011-08-01 ENCOUNTER — Encounter (HOSPITAL_BASED_OUTPATIENT_CLINIC_OR_DEPARTMENT_OTHER): Payer: BC Managed Care – PPO | Admitting: Oncology

## 2011-08-01 DIAGNOSIS — K7689 Other specified diseases of liver: Secondary | ICD-10-CM

## 2011-08-01 DIAGNOSIS — Z5111 Encounter for antineoplastic chemotherapy: Secondary | ICD-10-CM

## 2011-08-01 DIAGNOSIS — D126 Benign neoplasm of colon, unspecified: Secondary | ICD-10-CM

## 2011-08-01 DIAGNOSIS — D509 Iron deficiency anemia, unspecified: Secondary | ICD-10-CM

## 2011-08-01 LAB — PROTIME-INR
INR: 2.8 (ref 2.00–3.50)
Protime: 33.6 Seconds — ABNORMAL HIGH (ref 10.6–13.4)

## 2011-08-05 ENCOUNTER — Other Ambulatory Visit: Payer: Self-pay | Admitting: Oncology

## 2011-08-05 ENCOUNTER — Encounter (HOSPITAL_BASED_OUTPATIENT_CLINIC_OR_DEPARTMENT_OTHER): Payer: BC Managed Care – PPO | Admitting: Oncology

## 2011-08-05 ENCOUNTER — Ambulatory Visit (HOSPITAL_COMMUNITY)
Admission: RE | Admit: 2011-08-05 | Discharge: 2011-08-05 | Disposition: A | Discharge status: Home / Self Care | Payer: BC Managed Care – PPO | Source: Ambulatory Visit | Attending: Oncology | Admitting: Oncology

## 2011-08-05 DIAGNOSIS — D126 Benign neoplasm of colon, unspecified: Secondary | ICD-10-CM

## 2011-08-05 DIAGNOSIS — D509 Iron deficiency anemia, unspecified: Secondary | ICD-10-CM

## 2011-08-05 DIAGNOSIS — C186 Malignant neoplasm of descending colon: Secondary | ICD-10-CM

## 2011-08-05 DIAGNOSIS — T148XXA Other injury of unspecified body region, initial encounter: Secondary | ICD-10-CM

## 2011-08-05 DIAGNOSIS — Z9049 Acquired absence of other specified parts of digestive tract: Secondary | ICD-10-CM

## 2011-08-05 DIAGNOSIS — Z79899 Other long term (current) drug therapy: Secondary | ICD-10-CM

## 2011-08-05 DIAGNOSIS — K7689 Other specified diseases of liver: Secondary | ICD-10-CM

## 2011-08-05 DIAGNOSIS — S20219A Contusion of unspecified front wall of thorax, initial encounter: Secondary | ICD-10-CM | POA: Insufficient documentation

## 2011-08-05 DIAGNOSIS — A048 Other specified bacterial intestinal infections: Secondary | ICD-10-CM

## 2011-08-05 DIAGNOSIS — X58XXXA Exposure to other specified factors, initial encounter: Secondary | ICD-10-CM | POA: Insufficient documentation

## 2011-08-05 LAB — CBC WITH DIFFERENTIAL/PLATELET
BASO%: 0.5 % (ref 0.0–2.0)
Basophils Absolute: 0 10*3/uL (ref 0.0–0.1)
EOS%: 3 % (ref 0.0–7.0)
Eosinophils Absolute: 0.1 10*3/uL (ref 0.0–0.5)
HCT: 27.5 % — ABNORMAL LOW (ref 34.8–46.6)
HGB: 8.9 g/dL — ABNORMAL LOW (ref 11.6–15.9)
LYMPH%: 37.2 % (ref 14.0–49.7)
MCH: 21.9 pg — ABNORMAL LOW (ref 25.1–34.0)
MCHC: 32.4 g/dL (ref 31.5–36.0)
MCV: 67.6 fL — ABNORMAL LOW (ref 79.5–101.0)
MONO#: 0.3 10*3/uL (ref 0.1–0.9)
MONO%: 7.3 % (ref 0.0–14.0)
NEUT#: 2.1 10*3/uL (ref 1.5–6.5)
NEUT%: 52 % (ref 38.4–76.8)
Platelets: 227 10*3/uL (ref 145–400)
RBC: 4.07 10*6/uL (ref 3.70–5.45)
RDW: 19.3 % — ABNORMAL HIGH (ref 11.2–14.5)
WBC: 4 10*3/uL (ref 3.9–10.3)
lymph#: 1.5 10*3/uL (ref 0.9–3.3)
nRBC: 0 % (ref 0–0)

## 2011-08-05 LAB — PROTIME-INR
INR: 2.9 (ref 2.00–3.50)
Protime: 34.8 Seconds — ABNORMAL HIGH (ref 10.6–13.4)

## 2011-08-05 MED ORDER — IOHEXOL 300 MG/ML  SOLN
20.0000 mL | Freq: Once | INTRAMUSCULAR | Status: AC | PRN
  Administered 2011-08-05: 20 mL

## 2011-08-06 ENCOUNTER — Encounter (HOSPITAL_BASED_OUTPATIENT_CLINIC_OR_DEPARTMENT_OTHER): Payer: BC Managed Care – PPO | Admitting: Oncology

## 2011-08-06 DIAGNOSIS — Z5111 Encounter for antineoplastic chemotherapy: Secondary | ICD-10-CM

## 2011-08-06 DIAGNOSIS — D126 Benign neoplasm of colon, unspecified: Secondary | ICD-10-CM

## 2011-08-08 ENCOUNTER — Encounter (HOSPITAL_BASED_OUTPATIENT_CLINIC_OR_DEPARTMENT_OTHER): Payer: BC Managed Care – PPO | Admitting: Oncology

## 2011-08-08 DIAGNOSIS — D126 Benign neoplasm of colon, unspecified: Secondary | ICD-10-CM

## 2011-08-19 ENCOUNTER — Other Ambulatory Visit: Payer: Self-pay | Admitting: Oncology

## 2011-08-19 ENCOUNTER — Encounter (HOSPITAL_BASED_OUTPATIENT_CLINIC_OR_DEPARTMENT_OTHER): Payer: BC Managed Care – PPO | Admitting: Oncology

## 2011-08-19 DIAGNOSIS — I82629 Acute embolism and thrombosis of deep veins of unspecified upper extremity: Secondary | ICD-10-CM

## 2011-08-19 DIAGNOSIS — C186 Malignant neoplasm of descending colon: Secondary | ICD-10-CM

## 2011-08-19 DIAGNOSIS — D509 Iron deficiency anemia, unspecified: Secondary | ICD-10-CM

## 2011-08-19 DIAGNOSIS — Z5111 Encounter for antineoplastic chemotherapy: Secondary | ICD-10-CM

## 2011-08-19 DIAGNOSIS — D126 Benign neoplasm of colon, unspecified: Secondary | ICD-10-CM

## 2011-08-19 DIAGNOSIS — K7689 Other specified diseases of liver: Secondary | ICD-10-CM

## 2011-08-19 LAB — COMPREHENSIVE METABOLIC PANEL
ALT: 9 U/L (ref 0–35)
AST: 11 U/L (ref 0–37)
Albumin: 3.3 g/dL — ABNORMAL LOW (ref 3.5–5.2)
Alkaline Phosphatase: 105 U/L (ref 39–117)
BUN: 10 mg/dL (ref 6–23)
CO2: 26 mEq/L (ref 19–32)
Calcium: 9.4 mg/dL (ref 8.4–10.5)
Chloride: 101 mEq/L (ref 96–112)
Creatinine, Ser: 0.82 mg/dL (ref 0.50–1.10)
Glucose, Bld: 121 mg/dL — ABNORMAL HIGH (ref 70–99)
Potassium: 3.4 mEq/L — ABNORMAL LOW (ref 3.5–5.3)
Sodium: 135 mEq/L (ref 135–145)
Total Bilirubin: 0.3 mg/dL (ref 0.3–1.2)
Total Protein: 6.9 g/dL (ref 6.0–8.3)

## 2011-08-19 LAB — CBC WITH DIFFERENTIAL/PLATELET
BASO%: 0.4 % (ref 0.0–2.0)
Basophils Absolute: 0 10*3/uL (ref 0.0–0.1)
EOS%: 0.9 % (ref 0.0–7.0)
Eosinophils Absolute: 0 10*3/uL (ref 0.0–0.5)
HCT: 30.2 % — ABNORMAL LOW (ref 34.8–46.6)
HGB: 9.6 g/dL — ABNORMAL LOW (ref 11.6–15.9)
LYMPH%: 36.9 % (ref 14.0–49.7)
MCH: 22.1 pg — ABNORMAL LOW (ref 25.1–34.0)
MCHC: 31.8 g/dL (ref 31.5–36.0)
MCV: 69.4 fL — ABNORMAL LOW (ref 79.5–101.0)
MONO#: 0.6 10*3/uL (ref 0.1–0.9)
MONO%: 13 % (ref 0.0–14.0)
NEUT#: 2.2 10*3/uL (ref 1.5–6.5)
NEUT%: 48.8 % (ref 38.4–76.8)
Platelets: 172 10*3/uL (ref 145–400)
RBC: 4.35 10*6/uL (ref 3.70–5.45)
RDW: 20.7 % — ABNORMAL HIGH (ref 11.2–14.5)
WBC: 4.6 10*3/uL (ref 3.9–10.3)
lymph#: 1.7 10*3/uL (ref 0.9–3.3)
nRBC: 0 % (ref 0–0)

## 2011-08-19 LAB — PROTIME-INR
INR: 2.8 (ref 2.00–3.50)
Protime: 33.6 Seconds — ABNORMAL HIGH (ref 10.6–13.4)

## 2011-08-21 ENCOUNTER — Encounter (HOSPITAL_BASED_OUTPATIENT_CLINIC_OR_DEPARTMENT_OTHER): Payer: BC Managed Care – PPO | Admitting: Oncology

## 2011-08-21 DIAGNOSIS — Z452 Encounter for adjustment and management of vascular access device: Secondary | ICD-10-CM

## 2011-08-21 DIAGNOSIS — C186 Malignant neoplasm of descending colon: Secondary | ICD-10-CM

## 2011-08-29 ENCOUNTER — Other Ambulatory Visit: Payer: Self-pay | Admitting: *Deleted

## 2011-08-29 DIAGNOSIS — C189 Malignant neoplasm of colon, unspecified: Secondary | ICD-10-CM

## 2011-08-30 ENCOUNTER — Other Ambulatory Visit: Payer: Self-pay | Admitting: Oncology

## 2011-08-30 DIAGNOSIS — D509 Iron deficiency anemia, unspecified: Secondary | ICD-10-CM | POA: Insufficient documentation

## 2011-09-02 ENCOUNTER — Other Ambulatory Visit: Payer: Self-pay | Admitting: Oncology

## 2011-09-02 ENCOUNTER — Encounter (HOSPITAL_BASED_OUTPATIENT_CLINIC_OR_DEPARTMENT_OTHER): Payer: BC Managed Care – PPO | Admitting: Oncology

## 2011-09-02 DIAGNOSIS — D509 Iron deficiency anemia, unspecified: Secondary | ICD-10-CM

## 2011-09-02 DIAGNOSIS — I82629 Acute embolism and thrombosis of deep veins of unspecified upper extremity: Secondary | ICD-10-CM

## 2011-09-02 DIAGNOSIS — K7689 Other specified diseases of liver: Secondary | ICD-10-CM

## 2011-09-02 DIAGNOSIS — Z5111 Encounter for antineoplastic chemotherapy: Secondary | ICD-10-CM

## 2011-09-02 DIAGNOSIS — C186 Malignant neoplasm of descending colon: Secondary | ICD-10-CM

## 2011-09-02 DIAGNOSIS — D126 Benign neoplasm of colon, unspecified: Secondary | ICD-10-CM

## 2011-09-02 LAB — PROTIME-INR
INR: 1.4 — ABNORMAL LOW (ref 2.00–3.50)
Protime: 16.8 Seconds — ABNORMAL HIGH (ref 10.6–13.4)

## 2011-09-02 LAB — COMPREHENSIVE METABOLIC PANEL
ALT: 11 U/L (ref 0–35)
AST: 13 U/L (ref 0–37)
Albumin: 3.5 g/dL (ref 3.5–5.2)
Alkaline Phosphatase: 112 U/L (ref 39–117)
BUN: 10 mg/dL (ref 6–23)
CO2: 27 mEq/L (ref 19–32)
Calcium: 9.4 mg/dL (ref 8.4–10.5)
Chloride: 104 mEq/L (ref 96–112)
Creatinine, Ser: 0.96 mg/dL (ref 0.50–1.10)
Glucose, Bld: 115 mg/dL — ABNORMAL HIGH (ref 70–99)
Potassium: 3.8 mEq/L (ref 3.5–5.3)
Sodium: 138 mEq/L (ref 135–145)
Total Bilirubin: 0.3 mg/dL (ref 0.3–1.2)
Total Protein: 7.1 g/dL (ref 6.0–8.3)

## 2011-09-02 LAB — CBC WITH DIFFERENTIAL/PLATELET
BASO%: 0.6 % (ref 0.0–2.0)
Basophils Absolute: 0 10*3/uL (ref 0.0–0.1)
EOS%: 1.5 % (ref 0.0–7.0)
Eosinophils Absolute: 0.1 10*3/uL (ref 0.0–0.5)
HCT: 28.9 % — ABNORMAL LOW (ref 34.8–46.6)
HGB: 9.3 g/dL — ABNORMAL LOW (ref 11.6–15.9)
LYMPH%: 29.2 % (ref 14.0–49.7)
MCH: 22.3 pg — ABNORMAL LOW (ref 25.1–34.0)
MCHC: 32.2 g/dL (ref 31.5–36.0)
MCV: 69.3 fL — ABNORMAL LOW (ref 79.5–101.0)
MONO#: 0.6 10*3/uL (ref 0.1–0.9)
MONO%: 10.5 % (ref 0.0–14.0)
NEUT#: 3.1 10*3/uL (ref 1.5–6.5)
NEUT%: 58.2 % (ref 38.4–76.8)
Platelets: 160 10*3/uL (ref 145–400)
RBC: 4.17 10*6/uL (ref 3.70–5.45)
RDW: 21.7 % — ABNORMAL HIGH (ref 11.2–14.5)
WBC: 5.4 10*3/uL (ref 3.9–10.3)
lymph#: 1.6 10*3/uL (ref 0.9–3.3)
nRBC: 0 % (ref 0–0)

## 2011-09-04 ENCOUNTER — Encounter (HOSPITAL_BASED_OUTPATIENT_CLINIC_OR_DEPARTMENT_OTHER): Payer: BC Managed Care – PPO | Admitting: Oncology

## 2011-09-04 DIAGNOSIS — Z452 Encounter for adjustment and management of vascular access device: Secondary | ICD-10-CM

## 2011-09-04 DIAGNOSIS — C186 Malignant neoplasm of descending colon: Secondary | ICD-10-CM

## 2011-09-08 ENCOUNTER — Encounter (INDEPENDENT_AMBULATORY_CARE_PROVIDER_SITE_OTHER): Payer: Self-pay | Admitting: General Surgery

## 2011-09-10 ENCOUNTER — Other Ambulatory Visit: Payer: Self-pay | Admitting: *Deleted

## 2011-09-11 ENCOUNTER — Other Ambulatory Visit: Payer: Self-pay | Admitting: Hematology and Oncology

## 2011-09-13 ENCOUNTER — Encounter: Payer: Self-pay | Admitting: *Deleted

## 2011-09-16 ENCOUNTER — Ambulatory Visit (HOSPITAL_BASED_OUTPATIENT_CLINIC_OR_DEPARTMENT_OTHER): Payer: BC Managed Care – PPO

## 2011-09-16 ENCOUNTER — Other Ambulatory Visit: Payer: Self-pay | Admitting: Hematology and Oncology

## 2011-09-16 ENCOUNTER — Other Ambulatory Visit (HOSPITAL_BASED_OUTPATIENT_CLINIC_OR_DEPARTMENT_OTHER): Payer: BC Managed Care – PPO | Admitting: Lab

## 2011-09-16 ENCOUNTER — Other Ambulatory Visit: Payer: Self-pay | Admitting: Oncology

## 2011-09-16 ENCOUNTER — Other Ambulatory Visit: Payer: Self-pay | Admitting: *Deleted

## 2011-09-16 ENCOUNTER — Ambulatory Visit (HOSPITAL_BASED_OUTPATIENT_CLINIC_OR_DEPARTMENT_OTHER): Payer: BC Managed Care – PPO | Admitting: Hematology and Oncology

## 2011-09-16 ENCOUNTER — Telehealth: Payer: Self-pay | Admitting: *Deleted

## 2011-09-16 ENCOUNTER — Encounter: Payer: Self-pay | Admitting: Oncology

## 2011-09-16 VITALS — BP 147/97 | HR 85 | Ht 69.5 in | Wt 216.9 lb

## 2011-09-16 VITALS — Temp 97.5°F

## 2011-09-16 DIAGNOSIS — C186 Malignant neoplasm of descending colon: Secondary | ICD-10-CM

## 2011-09-16 DIAGNOSIS — Z7901 Long term (current) use of anticoagulants: Secondary | ICD-10-CM

## 2011-09-16 DIAGNOSIS — Z5111 Encounter for antineoplastic chemotherapy: Secondary | ICD-10-CM

## 2011-09-16 DIAGNOSIS — I82629 Acute embolism and thrombosis of deep veins of unspecified upper extremity: Secondary | ICD-10-CM

## 2011-09-16 DIAGNOSIS — C189 Malignant neoplasm of colon, unspecified: Secondary | ICD-10-CM

## 2011-09-16 DIAGNOSIS — D509 Iron deficiency anemia, unspecified: Secondary | ICD-10-CM

## 2011-09-16 LAB — COMPREHENSIVE METABOLIC PANEL
ALT: 13 U/L (ref 0–35)
AST: 17 U/L (ref 0–37)
Albumin: 3.4 g/dL — ABNORMAL LOW (ref 3.5–5.2)
Alkaline Phosphatase: 100 U/L (ref 39–117)
BUN: 14 mg/dL (ref 6–23)
CO2: 24 mEq/L (ref 19–32)
Calcium: 9.3 mg/dL (ref 8.4–10.5)
Chloride: 102 mEq/L (ref 96–112)
Creatinine, Ser: 1.05 mg/dL (ref 0.50–1.10)
Glucose, Bld: 110 mg/dL — ABNORMAL HIGH (ref 70–99)
Potassium: 3.2 mEq/L — ABNORMAL LOW (ref 3.5–5.3)
Sodium: 135 mEq/L (ref 135–145)
Total Bilirubin: 0.5 mg/dL (ref 0.3–1.2)
Total Protein: 6.8 g/dL (ref 6.0–8.3)

## 2011-09-16 LAB — CBC WITH DIFFERENTIAL/PLATELET
BASO%: 0.8 % (ref 0.0–2.0)
Basophils Absolute: 0 10*3/uL (ref 0.0–0.1)
EOS%: 1.3 % (ref 0.0–7.0)
Eosinophils Absolute: 0.1 10*3/uL (ref 0.0–0.5)
HCT: 28 % — ABNORMAL LOW (ref 34.8–46.6)
HGB: 9 g/dL — ABNORMAL LOW (ref 11.6–15.9)
LYMPH%: 39.5 % (ref 14.0–49.7)
MCH: 23.5 pg — ABNORMAL LOW (ref 25.1–34.0)
MCHC: 32.2 g/dL (ref 31.5–36.0)
MCV: 73 fL — ABNORMAL LOW (ref 79.5–101.0)
MONO#: 0.5 10*3/uL (ref 0.1–0.9)
MONO%: 11.7 % (ref 0.0–14.0)
NEUT#: 2 10*3/uL (ref 1.5–6.5)
NEUT%: 46.7 % (ref 38.4–76.8)
Platelets: 121 10*3/uL — ABNORMAL LOW (ref 145–400)
RBC: 3.84 10*6/uL (ref 3.70–5.45)
RDW: 24 % — ABNORMAL HIGH (ref 11.2–14.5)
WBC: 4.3 10*3/uL (ref 3.9–10.3)
lymph#: 1.7 10*3/uL (ref 0.9–3.3)

## 2011-09-16 LAB — PROTIME-INR
INR: 1.3 — ABNORMAL LOW (ref 2.00–3.50)
Protime: 15.6 Seconds — ABNORMAL HIGH (ref 10.6–13.4)

## 2011-09-16 MED ORDER — SODIUM CHLORIDE 0.9 % IJ SOLN
10.0000 mL | INTRAMUSCULAR | Status: DC | PRN
  Filled 2011-09-16: qty 10

## 2011-09-16 MED ORDER — HEPARIN SOD (PORK) LOCK FLUSH 100 UNIT/ML IV SOLN
500.0000 [IU] | Freq: Once | INTRAVENOUS | Status: DC | PRN
  Filled 2011-09-16: qty 5

## 2011-09-16 MED ORDER — DEXTROSE 5 % IV SOLN: Freq: Once | INTRAVENOUS | Status: DC

## 2011-09-16 MED ORDER — SODIUM CHLORIDE 0.9 % IV SOLN
2400.0000 mg/m2 | INTRAVENOUS | Status: DC
  Administered 2011-09-16: 5300 mg via INTRAVENOUS
  Filled 2011-09-16: qty 106

## 2011-09-16 MED ORDER — FLUOROURACIL CHEMO INJECTION 2.5 GM/50ML
400.0000 mg/m2 | Freq: Once | INTRAVENOUS | Status: AC
  Administered 2011-09-16: 900 mg via INTRAVENOUS
  Filled 2011-09-16: qty 18

## 2011-09-16 MED ORDER — ONDANSETRON 8 MG/50ML IVPB (CHCC)
8.0000 mg | Freq: Once | INTRAVENOUS | Status: AC
  Administered 2011-09-16: 8 mg via INTRAVENOUS

## 2011-09-16 MED ORDER — LEUCOVORIN CALCIUM INJECTION 350 MG
400.0000 mg/m2 | Freq: Once | INTRAVENOUS | Status: AC
  Administered 2011-09-16: 884 mg via INTRAVENOUS
  Filled 2011-09-16: qty 44.2

## 2011-09-16 MED ORDER — OXALIPLATIN CHEMO INJECTION 100 MG/20ML
82.0000 mg/m2 | Freq: Once | INTRAVENOUS | Status: AC
  Administered 2011-09-16: 180 mg via INTRAVENOUS
  Filled 2011-09-16: qty 36

## 2011-09-16 MED ORDER — DEXAMETHASONE SODIUM PHOSPHATE 10 MG/ML IJ SOLN
10.0000 mg | Freq: Once | INTRAMUSCULAR | Status: AC
  Administered 2011-09-16: 10 mg via INTRAVENOUS

## 2011-09-16 NOTE — Progress Notes (Signed)
OFFICE PROGRESS NOTE  Interval history:  Ms. Atayde returns as scheduled. She completed cycle #6 of adjuvant FOLFOX chemotherapy beginning 09/02/2011.  Ms. Abramo reports that overall she feels well. She states that she has had a "good 2 weeks". She denies nausea or vomiting. No mouth sores. No diarrhea. She noted cold sensitivity with food and drinks  for approximately 8 days after the most recent chemotherapy. She continues to note mild numbness/tingling in the hands and feet with cold exposure. She denies neuropathy symptoms in the absence of cold exposure.  She continues Coumadin. The current Coumadin dose is 2.5 mg alternating with 5 mg. She has not missed any doses. She denies bleeding.   Objective: Vital signs in last 24 hours: Blood pressure 147/97, pulse 85, height 5' 9.5" (1.765 m), weight 216 lb 14.4 oz (98.385 kg).  Oropharynx is without thrush or ulceration. Lungs are clear. No wheezes or rales. Regular cardiac rhythm. Port-A-Cath site is nontender and without erythema. Abdomen is soft and nontender. No hepatomegaly. Extremities are without edema. Calves are soft and nontender. Vibratory sense is intact over the fingertips per tuning fork exam.   Lab Results:   Hemoglobin 9.0 white count 4.3 absolute neutrophil count 2.0 platelet count 121,000 PT 15.6, INR 1.30     Studies/Results: No results found.  Medications: I have reviewed the patient's current medications.  Assessment/Plan:  1. Stage III (pT2 pN2b) adenocarcinoma of the descending/sigmoid colon, status post a partial colectomy 05/12/2011, status post biopsy of a mass at 30 cm at the time of a colonoscopy 04/14/2011 with the pathology revealing a tubulovillous adenoma with high-grade dysplasia. She began adjuvant FOLFOX chemotherapy 06/10/2011. She has completed 6 cycles to date. 2. Iron deficiency anemia-she is taking iron twice daily. 3. Indeterminate liver and renal lesions on an abdominal CT 04/14/2011, status  post an MRI of the abdomen 05/09/2011 consistent with multiple liver hemangiomas, renal cyst, and other too small to characterize liver lesions felt to most likely be benign. 4. History of intermittent abdominal pain and rectal bleeding secondary to the descending colon mass. 5. Port-A-Cath placement 06/02/2011, status post removal of the left-sided Port-A-Cath on 07/17/2011 and placement of a right chest Port-A-Cath on 07/17/2011. 6. Admission 07/19/2011 with bilateral pneumothoraces. 7. Left upper extremity deep vein thrombosis associated with the original left-sided Port-A-Cath diagnosed on 07/04/2011, maintained on Coumadin. The PT/INR remains subtherapeutic. She will increase the Coumadin dose to 5 mg daily. We will repeat the PT/INR when she returns on 09/18/2011. 8. 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 We decided to continue oxaliplatin. 9. Disposition-plan to proceed with cycle #7 of adjuvant FOLFOX chemotherapy today area she will return for an office visit and cycle #8 in 2 weeks. She will contact the office in the interim with any problems.  Plan reviewed with Dr. Truett Perna.     Jared Cahn ANP/GNP-BC

## 2011-09-16 NOTE — Patient Instructions (Signed)
[facility] Discharge Instructions for Patients Receiving Chemotherapy  Today you received theFOLFOX  To help prevent nausea and vomiting after your treatment, we encourage you to take your nausea medication Begin taking it tonight and take it as often as prescribed.   If you develop nausea and vomiting that is not controlled by your nausea medication, call the clinic. If it is after clinic hours your family physician or the after hours number for the clinic or go to the Emergency Department.   BELOW ARE SYMPTOMS THAT SHOULD BE REPORTED IMMEDIATELY:  *FEVER GREATER THAN 100.5 F  *CHILLS WITH OR WITHOUT FEVER  NAUSEA AND VOMITING THAT IS NOT CONTROLLED WITH YOUR NAUSEA MEDICATION  *UNUSUAL SHORTNESS OF BREATH  *UNUSUAL BRUISING OR BLEEDING  TENDERNESS IN MOUTH AND THROAT WITH OR WITHOUT PRESENCE OF ULCERS  *URINARY PROBLEMS  *BOWEL PROBLEMS  UNUSUAL RASH Items with * indicate a potential emergency and should be followed up as soon as possible.  One of the nurses will contact you 24 hours after your treatment. Please let the nurse know about any problems that you may have experienced. Feel free to call the clinic you have any questions or concerns. The clinic phone number is [phone].   I have been informed and understand all the instructions given to me. I know to contact the clinic, my physician, or go to the Emergency Department if any problems should occur. I do not have any questions at this time, but understand that I may call the clinic during office hours   should I have any questions or need assistance in obtaining follow up care.    __________________________________________  _____________  __________ Signature of Patient or Authorized Representative            Date                   Time    __________________________________________ Nurse's Signature

## 2011-09-16 NOTE — Telephone Encounter (Signed)
Called patient after NP review of C met today---need to increase K-tab 20 meq to bid X 3 days, then daily. Recheck K+ in 2 weeks.

## 2011-09-18 ENCOUNTER — Other Ambulatory Visit (HOSPITAL_BASED_OUTPATIENT_CLINIC_OR_DEPARTMENT_OTHER): Payer: BC Managed Care – PPO | Admitting: Lab

## 2011-09-18 ENCOUNTER — Ambulatory Visit (HOSPITAL_BASED_OUTPATIENT_CLINIC_OR_DEPARTMENT_OTHER): Payer: BC Managed Care – PPO

## 2011-09-18 ENCOUNTER — Other Ambulatory Visit: Payer: Self-pay | Admitting: Nurse Practitioner

## 2011-09-18 ENCOUNTER — Telehealth: Payer: Self-pay | Admitting: *Deleted

## 2011-09-18 DIAGNOSIS — I82629 Acute embolism and thrombosis of deep veins of unspecified upper extremity: Secondary | ICD-10-CM

## 2011-09-18 DIAGNOSIS — C189 Malignant neoplasm of colon, unspecified: Secondary | ICD-10-CM

## 2011-09-18 LAB — PROTIME-INR
INR: 1.8 — ABNORMAL LOW (ref 2.00–3.50)
Protime: 21.6 Seconds — ABNORMAL HIGH (ref 10.6–13.4)

## 2011-09-18 MED ORDER — SODIUM CHLORIDE 0.9 % IJ SOLN
10.0000 mL | INTRAMUSCULAR | Status: DC | PRN
  Administered 2011-09-18: 10 mL via INTRAVENOUS
  Filled 2011-09-18: qty 10

## 2011-09-18 MED ORDER — HEPARIN SOD (PORK) LOCK FLUSH 100 UNIT/ML IV SOLN
500.0000 [IU] | Freq: Once | INTRAVENOUS | Status: AC
  Administered 2011-09-18: 500 [IU] via INTRAVENOUS
  Filled 2011-09-18: qty 5

## 2011-09-18 NOTE — Telephone Encounter (Signed)
Notified patient to take Coumadin 5mg  daily, except 2.5 mg on M & Th. Will recheck on 11/12. Scheduler will call her. She understands and agrees.

## 2011-09-19 ENCOUNTER — Encounter: Payer: Self-pay | Admitting: *Deleted

## 2011-09-19 ENCOUNTER — Telehealth: Payer: Self-pay | Admitting: Oncology

## 2011-09-19 ENCOUNTER — Telehealth: Payer: Self-pay | Admitting: *Deleted

## 2011-09-19 NOTE — Telephone Encounter (Signed)
Notified patient to increase Ktab to twice daily.

## 2011-09-19 NOTE — Telephone Encounter (Signed)
lmonvm advising the pt of her lab appt on 09/22/2011

## 2011-09-22 ENCOUNTER — Telehealth: Payer: Self-pay | Admitting: *Deleted

## 2011-09-22 ENCOUNTER — Other Ambulatory Visit (HOSPITAL_BASED_OUTPATIENT_CLINIC_OR_DEPARTMENT_OTHER): Payer: BC Managed Care – PPO | Admitting: Lab

## 2011-09-22 DIAGNOSIS — I82629 Acute embolism and thrombosis of deep veins of unspecified upper extremity: Secondary | ICD-10-CM

## 2011-09-22 LAB — PROTIME-INR
INR: 2.1 (ref 2.00–3.50)
Protime: 25.2 Seconds — ABNORMAL HIGH (ref 10.6–13.4)

## 2011-09-22 MED ORDER — WARFARIN SODIUM 5 MG PO TABS: 5.0000 mg | ORAL_TABLET | Freq: Every day | ORAL | Status: DC

## 2011-09-22 NOTE — Telephone Encounter (Signed)
Cancel encounter--opened chart in error.

## 2011-09-22 NOTE — Telephone Encounter (Signed)
Per Dr. Truett Perna: Continue Coumadin 5 mg daily except 2.5 mg on Monday & Thursday. Will recheck on 11/19

## 2011-09-29 ENCOUNTER — Other Ambulatory Visit: Payer: Self-pay | Admitting: Oncology

## 2011-09-29 ENCOUNTER — Other Ambulatory Visit: Payer: BC Managed Care – PPO | Admitting: Lab

## 2011-09-29 ENCOUNTER — Ambulatory Visit (HOSPITAL_BASED_OUTPATIENT_CLINIC_OR_DEPARTMENT_OTHER): Payer: BC Managed Care – PPO | Admitting: Nurse Practitioner

## 2011-09-29 ENCOUNTER — Telehealth: Payer: Self-pay | Admitting: *Deleted

## 2011-09-29 ENCOUNTER — Other Ambulatory Visit: Payer: Self-pay | Admitting: Nurse Practitioner

## 2011-09-29 ENCOUNTER — Telehealth: Payer: Self-pay | Admitting: Oncology

## 2011-09-29 ENCOUNTER — Ambulatory Visit (HOSPITAL_BASED_OUTPATIENT_CLINIC_OR_DEPARTMENT_OTHER): Payer: BC Managed Care – PPO

## 2011-09-29 DIAGNOSIS — Z5111 Encounter for antineoplastic chemotherapy: Secondary | ICD-10-CM

## 2011-09-29 DIAGNOSIS — C186 Malignant neoplasm of descending colon: Secondary | ICD-10-CM

## 2011-09-29 DIAGNOSIS — I82629 Acute embolism and thrombosis of deep veins of unspecified upper extremity: Secondary | ICD-10-CM

## 2011-09-29 DIAGNOSIS — C189 Malignant neoplasm of colon, unspecified: Secondary | ICD-10-CM

## 2011-09-29 DIAGNOSIS — D649 Anemia, unspecified: Secondary | ICD-10-CM

## 2011-09-29 DIAGNOSIS — D509 Iron deficiency anemia, unspecified: Secondary | ICD-10-CM

## 2011-09-29 LAB — CBC WITH DIFFERENTIAL/PLATELET
BASO%: 0.5 % (ref 0.0–2.0)
Basophils Absolute: 0 10*3/uL (ref 0.0–0.1)
EOS%: 1 % (ref 0.0–7.0)
Eosinophils Absolute: 0 10*3/uL (ref 0.0–0.5)
HCT: 30 % — ABNORMAL LOW (ref 34.8–46.6)
HGB: 9.8 g/dL — ABNORMAL LOW (ref 11.6–15.9)
LYMPH%: 43.4 % (ref 14.0–49.7)
MCH: 22.8 pg — ABNORMAL LOW (ref 25.1–34.0)
MCHC: 32.7 g/dL (ref 31.5–36.0)
MCV: 69.9 fL — ABNORMAL LOW (ref 79.5–101.0)
MONO#: 0.5 10*3/uL (ref 0.1–0.9)
MONO%: 13.4 % (ref 0.0–14.0)
NEUT#: 1.7 10*3/uL (ref 1.5–6.5)
NEUT%: 41.7 % (ref 38.4–76.8)
Platelets: 134 10*3/uL — ABNORMAL LOW (ref 145–400)
RBC: 4.29 10*6/uL (ref 3.70–5.45)
RDW: 23.4 % — ABNORMAL HIGH (ref 11.2–14.5)
WBC: 4 10*3/uL (ref 3.9–10.3)
lymph#: 1.8 10*3/uL (ref 0.9–3.3)
nRBC: 0 % (ref 0–0)

## 2011-09-29 LAB — COMPREHENSIVE METABOLIC PANEL
ALT: 14 U/L (ref 0–35)
AST: 17 U/L (ref 0–37)
Albumin: 3.7 g/dL (ref 3.5–5.2)
Alkaline Phosphatase: 111 U/L (ref 39–117)
BUN: 10 mg/dL (ref 6–23)
CO2: 24 mEq/L (ref 19–32)
Calcium: 10 mg/dL (ref 8.4–10.5)
Chloride: 103 mEq/L (ref 96–112)
Creatinine, Ser: 1.01 mg/dL (ref 0.50–1.10)
Glucose, Bld: 107 mg/dL — ABNORMAL HIGH (ref 70–99)
Potassium: 4.4 mEq/L (ref 3.5–5.3)
Sodium: 134 mEq/L — ABNORMAL LOW (ref 135–145)
Total Bilirubin: 0.3 mg/dL (ref 0.3–1.2)
Total Protein: 7.4 g/dL (ref 6.0–8.3)

## 2011-09-29 LAB — TECHNOLOGIST REVIEW

## 2011-09-29 LAB — PROTIME-INR

## 2011-09-29 LAB — FERRITIN: Ferritin: 75 ng/mL (ref 10–291)

## 2011-09-29 LAB — PROTHROMBIN TIME
INR: 6.09 (ref <1.50)
Prothrombin Time: 55 seconds — ABNORMAL HIGH (ref 11.6–15.2)

## 2011-09-29 MED ORDER — DEXAMETHASONE SODIUM PHOSPHATE 10 MG/ML IJ SOLN
10.0000 mg | Freq: Once | INTRAMUSCULAR | Status: AC
  Administered 2011-09-29: 10 mg via INTRAVENOUS

## 2011-09-29 MED ORDER — SODIUM CHLORIDE 0.9 % IV SOLN
2400.0000 mg/m2 | INTRAVENOUS | Status: DC
  Administered 2011-09-29: 5300 mg via INTRAVENOUS
  Filled 2011-09-29: qty 106

## 2011-09-29 MED ORDER — OXALIPLATIN CHEMO INJECTION 100 MG/20ML
82.0000 mg/m2 | Freq: Once | INTRAVENOUS | Status: AC
  Administered 2011-09-29: 180 mg via INTRAVENOUS
  Filled 2011-09-29: qty 36

## 2011-09-29 MED ORDER — DEXTROSE 5 % IV SOLN
Freq: Once | INTRAVENOUS | Status: AC
  Administered 2011-09-29: 11:00:00 via INTRAVENOUS

## 2011-09-29 MED ORDER — DEXTROSE 5 % IV SOLN
400.0000 mg/m2 | Freq: Once | INTRAVENOUS | Status: AC
  Administered 2011-09-29: 884 mg via INTRAVENOUS
  Filled 2011-09-29: qty 44.2

## 2011-09-29 MED ORDER — FLUOROURACIL CHEMO INJECTION 2.5 GM/50ML
400.0000 mg/m2 | Freq: Once | INTRAVENOUS | Status: AC
  Administered 2011-09-29: 900 mg via INTRAVENOUS
  Filled 2011-09-29: qty 18

## 2011-09-29 MED ORDER — ONDANSETRON 8 MG/50ML IVPB (CHCC)
8.0000 mg | Freq: Once | INTRAVENOUS | Status: AC
  Administered 2011-09-29: 8 mg via INTRAVENOUS

## 2011-09-29 NOTE — Telephone Encounter (Signed)
INR 6.09---Hold Coumadin till further notice. Recheck on 11/21 per Dr. Truett Perna. Patient notified.

## 2011-09-29 NOTE — Progress Notes (Signed)
OFFICE PROGRESS NOTE  Interval history:  Mary [name] returns as scheduled. She completed cycle #7 of adjuvant FOLFOX chemotherapy beginning 09/16/2011.  Mary [name] reports tolerating cycle 7 of FOLFOX well. She denies nausea or vomiting. No mouth sores. No diarrhea. She tends to be constipated. She attributes the constipation to oral iron. She noted cold sensitivity with food and drinks  for 2-3 days after the most recent chemotherapy. She continues to note mild numbness/tingling in the hands and feet with cold exposure. The symptoms do not interfere with function. She denies neuropathy symptoms in the absence of cold exposure. She denies bleeding.    Objective: Vital signs in last 24 hours: Blood pressure 153/99, pulse 88, temperature 97.9 F (36.6 C), temperature source Oral, height 5' 9.5" (1.765 m), weight 212 lb 6.4 oz (96.344 kg).  Oropharynx is without thrush or ulceration. Lungs are clear. No wheezes or rales. Regular cardiac rhythm. Port-A-Cath site is nontender and without erythema. Abdomen is soft and nontender. No hepatomegaly. Extremities are without edema. Calves are soft and nontender. Vibratory sense is intact over the fingertips per tuning fork exam.   Lab Results:   Hemoglobin 9.8 white count 4.0 absolute neutrophil count 1.7 platelet count 134,000 MCV 69.9  PT/INR, chemistry panel pending    Studies/Results: No results found.  Medications: I have reviewed the patient's current medications.  Assessment/Plan:  1. Stage III (pT2 pN2b) adenocarcinoma of the descending/sigmoid colon, status post a partial colectomy 05/12/2011, status post biopsy of a mass at 30 cm at the time of a colonoscopy 04/14/2011 with the pathology revealing a tubulovillous adenoma with high-grade dysplasia. She began adjuvant FOLFOX chemotherapy 06/10/2011. She has completed 7cycles to date. 2. Iron deficiency anemia-she is taking iron twice daily. The MCV continues to be low. She will increase  oral iron to 3 times daily. We are obtaining a repeat ferritin today. 3. Indeterminate liver and renal lesions on an abdominal CT 04/14/2011, status post an MRI of the abdomen 05/09/2011 consistent with multiple liver hemangiomas, renal cyst, and other too small to characterize liver lesions felt to most likely be benign. 4. History of intermittent abdominal pain and rectal bleeding secondary to the descending colon mass. 5. Port-A-Cath placement 06/02/2011, status post removal of the left-sided Port-A-Cath on 07/17/2011 and placement of a right chest Port-A-Cath on 07/17/2011. 6. Admission 07/19/2011 with bilateral pneumothoraces. 7. Left upper extremity deep vein thrombosis associated with the original left-sided Port-A-Cath diagnosed on 07/04/2011, maintained on Coumadin. The PT/INR is pending  8. 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 We decided to continue oxaliplatin. 9. Disposition-plan to proceed with cycle #8 of adjuvant FOLFOX chemotherapy today.She will return for an office visit and cycle #9 in 2 weeks. She will contact the office in the interim with any problems.  Plan reviewed with Dr. Truett Perna.     Mary [name] ANP/GNP-BC

## 2011-09-29 NOTE — Telephone Encounter (Signed)
gve the pt her dec 2012 appt calendar °

## 2011-09-30 ENCOUNTER — Encounter (INDEPENDENT_AMBULATORY_CARE_PROVIDER_SITE_OTHER): Payer: PRIVATE HEALTH INSURANCE | Admitting: General Surgery

## 2011-10-01 ENCOUNTER — Telehealth: Payer: Self-pay | Admitting: *Deleted

## 2011-10-01 ENCOUNTER — Other Ambulatory Visit: Payer: Self-pay | Admitting: Oncology

## 2011-10-01 ENCOUNTER — Ambulatory Visit (HOSPITAL_BASED_OUTPATIENT_CLINIC_OR_DEPARTMENT_OTHER): Payer: BC Managed Care – PPO

## 2011-10-01 ENCOUNTER — Other Ambulatory Visit: Payer: Self-pay | Admitting: *Deleted

## 2011-10-01 ENCOUNTER — Other Ambulatory Visit: Payer: BC Managed Care – PPO | Admitting: Lab

## 2011-10-01 VITALS — BP 104/75 | HR 75 | Temp 97.7°F

## 2011-10-01 DIAGNOSIS — C186 Malignant neoplasm of descending colon: Secondary | ICD-10-CM

## 2011-10-01 DIAGNOSIS — C189 Malignant neoplasm of colon, unspecified: Secondary | ICD-10-CM

## 2011-10-01 DIAGNOSIS — I82629 Acute embolism and thrombosis of deep veins of unspecified upper extremity: Secondary | ICD-10-CM

## 2011-10-01 LAB — PROTHROMBIN TIME
INR: 6.44 (ref <1.50)
Prothrombin Time: 57.4 seconds — ABNORMAL HIGH (ref 11.6–15.2)

## 2011-10-01 LAB — COMPREHENSIVE METABOLIC PANEL
ALT: 27 U/L (ref 0–35)
AST: 28 U/L (ref 0–37)
Albumin: 3.7 g/dL (ref 3.5–5.2)
Alkaline Phosphatase: 101 U/L (ref 39–117)
BUN: 23 mg/dL (ref 6–23)
CO2: 26 mEq/L (ref 19–32)
Calcium: 9.4 mg/dL (ref 8.4–10.5)
Chloride: 107 mEq/L (ref 96–112)
Creatinine, Ser: 1.19 mg/dL — ABNORMAL HIGH (ref 0.50–1.10)
Glucose, Bld: 111 mg/dL — ABNORMAL HIGH (ref 70–99)
Potassium: 4 mEq/L (ref 3.5–5.3)
Sodium: 141 mEq/L (ref 135–145)
Total Bilirubin: 0.6 mg/dL (ref 0.3–1.2)
Total Protein: 7.3 g/dL (ref 6.0–8.3)

## 2011-10-01 LAB — CBC WITH DIFFERENTIAL/PLATELET
BASO%: 0.4 % (ref 0.0–2.0)
Basophils Absolute: 0 10*3/uL (ref 0.0–0.1)
EOS%: 0.4 % (ref 0.0–7.0)
Eosinophils Absolute: 0 10*3/uL (ref 0.0–0.5)
HCT: 31.4 % — ABNORMAL LOW (ref 34.8–46.6)
HGB: 10.3 g/dL — ABNORMAL LOW (ref 11.6–15.9)
LYMPH%: 22.5 % (ref 14.0–49.7)
MCH: 22.7 pg — ABNORMAL LOW (ref 25.1–34.0)
MCHC: 32.8 g/dL (ref 31.5–36.0)
MCV: 69.3 fL — ABNORMAL LOW (ref 79.5–101.0)
MONO#: 0.2 10*3/uL (ref 0.1–0.9)
MONO%: 8.4 % (ref 0.0–14.0)
NEUT#: 1.8 10*3/uL (ref 1.5–6.5)
NEUT%: 68.3 % (ref 38.4–76.8)
Platelets: 127 10*3/uL — ABNORMAL LOW (ref 145–400)
RBC: 4.53 10*6/uL (ref 3.70–5.45)
RDW: 23.2 % — ABNORMAL HIGH (ref 11.2–14.5)
WBC: 2.6 10*3/uL — ABNORMAL LOW (ref 3.9–10.3)
lymph#: 0.6 10*3/uL — ABNORMAL LOW (ref 0.9–3.3)
nRBC: 0 % (ref 0–0)

## 2011-10-01 LAB — PROTIME-INR

## 2011-10-01 MED ORDER — HEPARIN SOD (PORK) LOCK FLUSH 100 UNIT/ML IV SOLN
500.0000 [IU] | Freq: Once | INTRAVENOUS | Status: AC | PRN
  Administered 2011-10-01: 500 [IU]
  Filled 2011-10-01: qty 5

## 2011-10-01 MED ORDER — SODIUM CHLORIDE 0.9 % IJ SOLN
10.0000 mL | INTRAMUSCULAR | Status: DC | PRN
  Administered 2011-10-01: 10 mL
  Filled 2011-10-01: qty 10

## 2011-10-01 NOTE — Telephone Encounter (Signed)
INR higher at 6.44--left message to hold coumadin and recheck PT on 11/23 per Lonna Cobb, NP. Scheduler notified.

## 2011-10-01 NOTE — Patient Instructions (Signed)
Call MD for problems 

## 2011-10-03 ENCOUNTER — Other Ambulatory Visit: Payer: Self-pay | Admitting: Oncology

## 2011-10-03 ENCOUNTER — Other Ambulatory Visit: Payer: Self-pay | Admitting: *Deleted

## 2011-10-03 ENCOUNTER — Telehealth: Payer: Self-pay | Admitting: Oncology

## 2011-10-03 ENCOUNTER — Ambulatory Visit (HOSPITAL_BASED_OUTPATIENT_CLINIC_OR_DEPARTMENT_OTHER): Payer: BC Managed Care – PPO

## 2011-10-03 ENCOUNTER — Encounter: Payer: Self-pay | Admitting: *Deleted

## 2011-10-03 DIAGNOSIS — K7689 Other specified diseases of liver: Secondary | ICD-10-CM

## 2011-10-03 DIAGNOSIS — I82629 Acute embolism and thrombosis of deep veins of unspecified upper extremity: Secondary | ICD-10-CM

## 2011-10-03 LAB — PROTIME-INR
INR: 4.9 — ABNORMAL HIGH (ref 2.00–3.50)
Protime: 58.8 Seconds — ABNORMAL HIGH (ref 10.6–13.4)

## 2011-10-03 NOTE — Progress Notes (Signed)
Pt. Notified to hold coumadin & schedulers will call her for repeat PT/INR for mon 10/06/11.

## 2011-10-03 NOTE — Telephone Encounter (Signed)
Pt was in for PT/INR & called her at home & instructed her to cont. To hold her coumadin per Lonna Cobb NP & to repeat PT/INR mon.  Order placed & communicated with schedulers.

## 2011-10-03 NOTE — Telephone Encounter (Signed)
S/w the pt regarding her lab appt on monday

## 2011-10-03 NOTE — Telephone Encounter (Signed)
lmonvm advising the pt that dr Truett Perna wants her to have her labs done today. Asked the pt to call me back

## 2011-10-06 ENCOUNTER — Other Ambulatory Visit (HOSPITAL_BASED_OUTPATIENT_CLINIC_OR_DEPARTMENT_OTHER): Payer: BC Managed Care – PPO | Admitting: Lab

## 2011-10-06 ENCOUNTER — Other Ambulatory Visit: Payer: Self-pay | Admitting: *Deleted

## 2011-10-06 ENCOUNTER — Telehealth: Payer: Self-pay | Admitting: *Deleted

## 2011-10-06 DIAGNOSIS — I82629 Acute embolism and thrombosis of deep veins of unspecified upper extremity: Secondary | ICD-10-CM

## 2011-10-06 LAB — PROTIME-INR
INR: 2.2 (ref 2.00–3.50)
Protime: 26.4 Seconds — ABNORMAL HIGH (ref 10.6–13.4)

## 2011-10-06 NOTE — Telephone Encounter (Signed)
Called patient at home number and lvm regarding appt on 11/29 for labs at 9:30am.  Asked for a return call if time was not good for patient.

## 2011-10-06 NOTE — Telephone Encounter (Signed)
Left message for Coumadin 2.5mg  daily and recheck on 11/29 per Lonna Cobb, NP. Requested she call back to confirm.

## 2011-10-09 ENCOUNTER — Other Ambulatory Visit (HOSPITAL_BASED_OUTPATIENT_CLINIC_OR_DEPARTMENT_OTHER): Payer: BC Managed Care – PPO | Admitting: Lab

## 2011-10-09 DIAGNOSIS — C189 Malignant neoplasm of colon, unspecified: Secondary | ICD-10-CM

## 2011-10-09 DIAGNOSIS — I82629 Acute embolism and thrombosis of deep veins of unspecified upper extremity: Secondary | ICD-10-CM

## 2011-10-09 LAB — PROTIME-INR
INR: 1.6 — ABNORMAL LOW (ref 2.00–3.50)
Protime: 19.2 Seconds — ABNORMAL HIGH (ref 10.6–13.4)

## 2011-10-10 ENCOUNTER — Encounter: Payer: Self-pay | Admitting: *Deleted

## 2011-10-12 ENCOUNTER — Other Ambulatory Visit: Payer: Self-pay | Admitting: Oncology

## 2011-10-14 ENCOUNTER — Ambulatory Visit: Payer: BC Managed Care – PPO

## 2011-10-14 ENCOUNTER — Encounter: Payer: Self-pay | Admitting: *Deleted

## 2011-10-14 ENCOUNTER — Ambulatory Visit (HOSPITAL_BASED_OUTPATIENT_CLINIC_OR_DEPARTMENT_OTHER): Payer: BC Managed Care – PPO | Admitting: Nurse Practitioner

## 2011-10-14 ENCOUNTER — Other Ambulatory Visit (HOSPITAL_BASED_OUTPATIENT_CLINIC_OR_DEPARTMENT_OTHER): Payer: BC Managed Care – PPO | Admitting: Lab

## 2011-10-14 VITALS — BP 126/93 | HR 67 | Temp 97.6°F | Ht 69.5 in | Wt 216.0 lb

## 2011-10-14 DIAGNOSIS — D509 Iron deficiency anemia, unspecified: Secondary | ICD-10-CM

## 2011-10-14 DIAGNOSIS — C186 Malignant neoplasm of descending colon: Secondary | ICD-10-CM

## 2011-10-14 DIAGNOSIS — D702 Other drug-induced agranulocytosis: Secondary | ICD-10-CM

## 2011-10-14 DIAGNOSIS — I82629 Acute embolism and thrombosis of deep veins of unspecified upper extremity: Secondary | ICD-10-CM

## 2011-10-14 DIAGNOSIS — C189 Malignant neoplasm of colon, unspecified: Secondary | ICD-10-CM

## 2011-10-14 LAB — CBC WITH DIFFERENTIAL/PLATELET
BASO%: 1 % (ref 0.0–2.0)
Basophils Absolute: 0 10*3/uL (ref 0.0–0.1)
EOS%: 1 % (ref 0.0–7.0)
Eosinophils Absolute: 0 10*3/uL (ref 0.0–0.5)
HCT: 27.4 % — ABNORMAL LOW (ref 34.8–46.6)
HGB: 8.9 g/dL — ABNORMAL LOW (ref 11.6–15.9)
LYMPH%: 52.6 % — ABNORMAL HIGH (ref 14.0–49.7)
MCH: 22.8 pg — ABNORMAL LOW (ref 25.1–34.0)
MCHC: 32.5 g/dL (ref 31.5–36.0)
MCV: 70.1 fL — ABNORMAL LOW (ref 79.5–101.0)
MONO#: 0.5 10*3/uL (ref 0.1–0.9)
MONO%: 15.7 % — ABNORMAL HIGH (ref 0.0–14.0)
NEUT#: 0.9 10*3/uL — ABNORMAL LOW (ref 1.5–6.5)
NEUT%: 29.7 % — ABNORMAL LOW (ref 38.4–76.8)
Platelets: 109 10*3/uL — ABNORMAL LOW (ref 145–400)
RBC: 3.91 10*6/uL (ref 3.70–5.45)
RDW: 23.5 % — ABNORMAL HIGH (ref 11.2–14.5)
WBC: 3.1 10*3/uL — ABNORMAL LOW (ref 3.9–10.3)
lymph#: 1.6 10*3/uL (ref 0.9–3.3)
nRBC: 0 % (ref 0–0)

## 2011-10-14 LAB — PROTIME-INR
INR: 2.2 (ref 2.00–3.50)
Protime: 26.4 Seconds — ABNORMAL HIGH (ref 10.6–13.4)

## 2011-10-14 NOTE — Progress Notes (Signed)
INR 2.20--countinue Coumadin 2.5mg  daily and recheck on 10/28/11 per Lonna Cobb, NP

## 2011-10-14 NOTE — Progress Notes (Signed)
OFFICE PROGRESS NOTE  Interval history:  Mary [name] returns as scheduled. She completed cycle #8 of adjuvant FOLFOX chemotherapy beginning 09/29/2011.  Mary [name] reports she is tolerating the chemotherapy well. She denies nausea or vomiting. No mouth sores. No diarrhea.  She experienced cold sensitivity for 2-3 days after the most recent chemotherapy. She denies persistent neuropathy symptoms. She denies bleeding. She reports the current Coumadin dose is 2.5 mg daily.    Objective: Vital signs in last 24 hours: Blood pressure 126/93, pulse 67, temperature 97.6 F (36.4 C), temperature source Oral, height 5' 9.5" (1.765 m), weight 216 lb (97.977 kg).  Oropharynx is without thrush or ulceration. Lungs are clear. No wheezes or rales. Regular cardiac rhythm. Port-A-Cath site is covered with EMLA cream. Abdomen is soft and nontender. No hepatomegaly. Extremities are without edema. Calves are soft and nontender. Vibratory sense is intact over the fingertips per tuning fork exam.   Lab Results:  Hemoglobin 8.9 white count 3.1 absolute neutrophil count 0.9 platelet count 109,000 MCV 70.1 PT 26.4 INR 2.2     Studies/Results: No results found.  Medications: I have reviewed the patient's current medications.  Assessment/Plan:  1. Stage III (pT2 pN2b) adenocarcinoma of the descending/sigmoid colon, status post a partial colectomy 05/12/2011, status post biopsy of a mass at 30 cm at the time of a colonoscopy 04/14/2011 with the pathology revealing a tubulovillous adenoma with high-grade dysplasia. She began adjuvant FOLFOX chemotherapy 06/10/2011. She has completed 8 cycles to date. 2. Iron deficiency anemia-she is taking iron twice daily. The MCV continues to be low. Ferritin on 09/29/2011 was in normal range at 75. 3. Indeterminate liver and renal lesions on an abdominal CT 04/14/2011, status post an MRI of the abdomen 05/09/2011 consistent with multiple liver hemangiomas, renal cyst, and other  too small to characterize liver lesions felt to most likely be benign. 4. History of intermittent abdominal pain and rectal bleeding secondary to the descending colon mass. 5. Port-A-Cath placement 06/02/2011, status post removal of the left-sided Port-A-Cath on 07/17/2011 and placement of a right chest Port-A-Cath on 07/17/2011. 6. Admission 07/19/2011 with bilateral pneumothoraces. 7. Left upper extremity deep vein thrombosis associated with the original left-sided Port-A-Cath diagnosed on 07/04/2011, maintained on Coumadin. The INR is in therapeutic range. She will continue Coumadin at the current dose of 2.5 mg daily.  8. 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 9. Neutropenia secondary to chemotherapy. 9. Disposition-treatment will be held today due to neutropenia. We initially discussed rescheduling the treatment for one week. Mary [name] prefers that treatment be rescheduled for 2 weeks due to her work schedule. She will return for a followup visit and cycle 9 of FOLFOX on 10/28/2011. She will contact the office in the interim with any problems.  Plan reviewed with Dr. Truett Perna.     Lonna Cobb ANP/GNP-BC

## 2011-10-26 ENCOUNTER — Other Ambulatory Visit: Payer: Self-pay | Admitting: Oncology

## 2011-10-28 ENCOUNTER — Ambulatory Visit (HOSPITAL_BASED_OUTPATIENT_CLINIC_OR_DEPARTMENT_OTHER): Payer: BC Managed Care – PPO

## 2011-10-28 ENCOUNTER — Telehealth: Payer: Self-pay | Admitting: Oncology

## 2011-10-28 ENCOUNTER — Other Ambulatory Visit (HOSPITAL_BASED_OUTPATIENT_CLINIC_OR_DEPARTMENT_OTHER): Payer: BC Managed Care – PPO | Admitting: Lab

## 2011-10-28 ENCOUNTER — Ambulatory Visit (HOSPITAL_BASED_OUTPATIENT_CLINIC_OR_DEPARTMENT_OTHER): Payer: BC Managed Care – PPO | Admitting: Oncology

## 2011-10-28 DIAGNOSIS — C189 Malignant neoplasm of colon, unspecified: Secondary | ICD-10-CM

## 2011-10-28 DIAGNOSIS — Z5111 Encounter for antineoplastic chemotherapy: Secondary | ICD-10-CM

## 2011-10-28 DIAGNOSIS — C186 Malignant neoplasm of descending colon: Secondary | ICD-10-CM

## 2011-10-28 DIAGNOSIS — I82629 Acute embolism and thrombosis of deep veins of unspecified upper extremity: Secondary | ICD-10-CM

## 2011-10-28 DIAGNOSIS — D509 Iron deficiency anemia, unspecified: Secondary | ICD-10-CM

## 2011-10-28 LAB — COMPREHENSIVE METABOLIC PANEL
ALT: 21 U/L (ref 0–35)
AST: 22 U/L (ref 0–37)
Albumin: 3.9 g/dL (ref 3.5–5.2)
Alkaline Phosphatase: 90 U/L (ref 39–117)
BUN: 13 mg/dL (ref 6–23)
CO2: 25 mEq/L (ref 19–32)
Calcium: 9.2 mg/dL (ref 8.4–10.5)
Chloride: 105 mEq/L (ref 96–112)
Creatinine, Ser: 0.94 mg/dL (ref 0.50–1.10)
Glucose, Bld: 104 mg/dL — ABNORMAL HIGH (ref 70–99)
Potassium: 4 mEq/L (ref 3.5–5.3)
Sodium: 138 mEq/L (ref 135–145)
Total Bilirubin: 0.6 mg/dL (ref 0.3–1.2)
Total Protein: 7 g/dL (ref 6.0–8.3)

## 2011-10-28 LAB — CBC WITH DIFFERENTIAL/PLATELET
BASO%: 0.4 % (ref 0.0–2.0)
Basophils Absolute: 0 10*3/uL (ref 0.0–0.1)
EOS%: 0.9 % (ref 0.0–7.0)
Eosinophils Absolute: 0.1 10*3/uL (ref 0.0–0.5)
HCT: 30.9 % — ABNORMAL LOW (ref 34.8–46.6)
HGB: 10 g/dL — ABNORMAL LOW (ref 11.6–15.9)
LYMPH%: 33.8 % (ref 14.0–49.7)
MCH: 23.4 pg — ABNORMAL LOW (ref 25.1–34.0)
MCHC: 32.4 g/dL (ref 31.5–36.0)
MCV: 72.2 fL — ABNORMAL LOW (ref 79.5–101.0)
MONO#: 0.6 10*3/uL (ref 0.1–0.9)
MONO%: 8.6 % (ref 0.0–14.0)
NEUT#: 3.8 10*3/uL (ref 1.5–6.5)
NEUT%: 56.3 % (ref 38.4–76.8)
Platelets: 136 10*3/uL — ABNORMAL LOW (ref 145–400)
RBC: 4.28 10*6/uL (ref 3.70–5.45)
RDW: 23.4 % — ABNORMAL HIGH (ref 11.2–14.5)
WBC: 6.8 10*3/uL (ref 3.9–10.3)
lymph#: 2.3 10*3/uL (ref 0.9–3.3)
nRBC: 0 % (ref 0–0)

## 2011-10-28 LAB — PROTIME-INR
INR: 1.6 — ABNORMAL LOW (ref 2.00–3.50)
Protime: 19.2 Seconds — ABNORMAL HIGH (ref 10.6–13.4)

## 2011-10-28 LAB — TECHNOLOGIST REVIEW

## 2011-10-28 MED ORDER — LEUCOVORIN CALCIUM INJECTION 350 MG
400.0000 mg/m2 | Freq: Once | INTRAVENOUS | Status: AC
  Administered 2011-10-28: 884 mg via INTRAVENOUS
  Filled 2011-10-28: qty 44.2

## 2011-10-28 MED ORDER — ONDANSETRON 8 MG/50ML IVPB (CHCC)
8.0000 mg | Freq: Once | INTRAVENOUS | Status: AC
  Administered 2011-10-28: 8 mg via INTRAVENOUS

## 2011-10-28 MED ORDER — DEXTROSE 5 % IV SOLN
Freq: Once | INTRAVENOUS | Status: AC
  Administered 2011-10-28: 11:00:00 via INTRAVENOUS

## 2011-10-28 MED ORDER — DEXAMETHASONE SODIUM PHOSPHATE 10 MG/ML IJ SOLN
10.0000 mg | Freq: Once | INTRAMUSCULAR | Status: AC
  Administered 2011-10-28: 10 mg via INTRAVENOUS

## 2011-10-28 MED ORDER — OXALIPLATIN CHEMO INJECTION 100 MG/20ML
82.0000 mg/m2 | Freq: Once | INTRAVENOUS | Status: AC
  Administered 2011-10-28: 180 mg via INTRAVENOUS
  Filled 2011-10-28: qty 36

## 2011-10-28 MED ORDER — SODIUM CHLORIDE 0.9 % IV SOLN
2400.0000 mg/m2 | INTRAVENOUS | Status: DC
  Administered 2011-10-28: 5300 mg via INTRAVENOUS
  Filled 2011-10-28: qty 106

## 2011-10-28 MED ORDER — FLUOROURACIL CHEMO INJECTION 2.5 GM/50ML
400.0000 mg/m2 | Freq: Once | INTRAVENOUS | Status: AC
  Administered 2011-10-28: 900 mg via INTRAVENOUS
  Filled 2011-10-28: qty 18

## 2011-10-28 NOTE — Telephone Encounter (Signed)
Relative returned from chemo for appts. Completed schedule (added lbs to f/u - chemo appts) and gv relative appt schedule for dec/jan.

## 2011-10-28 NOTE — Patient Instructions (Signed)
10/28/11- 1525- Pt discharged ambulatory with next appointment confirmed.  Pt aware to call with any questions or concerns.

## 2011-10-28 NOTE — Progress Notes (Signed)
[id number] OFFICE PROGRESS NOTE   INTERVAL HISTORY:   She returns as scheduled. She was last treated with FOLFOX on November 19. She denies neuropathy symptoms today. Cold sensitivity has resolved. She has returned to work. She denies nausea, mouth sores, and diarrhea. There's been no recent problem with the Port-A-Cath. There is mild pain in the feet with prolonged standing.  Objective:  Vital signs in last 24 hours:  Blood pressure 140/94, pulse 87, temperature 98.2 F (36.8 C), temperature source Oral, height 5' 9.5" (1.765 m), weight 217 lb (98.431 kg).    HEENT: No thrush or ulcer Resp: Lungs clear bilaterally Cardio: Regular rate and rhythm GI: No hepatomegaly Vascular: No leg edema Neuro: The vibratory sense is intact versus very mildly decreased at the fingertip bilaterally    Portacath/PICC-without erythema  Lab Results:  Lab Results  Component Value Date   WBC 6.8 10/28/2011   HGB 10.0* 10/28/2011   HCT 30.9* 10/28/2011   MCV 72.2* 10/28/2011   PLT 136* 10/28/2011      Medications: I have reviewed the patient's current medications.  Assessment/Plan: 1.Stage III (pT2 pN2b) adenocarcinoma of the descending/sigmoid colon, status post a partial colectomy 05/12/2011, status post biopsy of a mass at 30 cm at the time of a colonoscopy 04/14/2011 with the pathology revealing a tubulovillous adenoma with high-grade dysplasia. She began adjuvant FOLFOX chemotherapy 06/10/2011. She has completed 8 cycles to date.  2. Iron deficiency anemia-she is taking iron twice daily. The MCV continues to be low. Ferritin on 09/29/2011 was in normal range at 75. The anemia is improved today. 3. Indeterminate liver and renal lesions on an abdominal CT 04/14/2011, status post an MRI of the abdomen 05/09/2011 consistent with multiple liver hemangiomas, renal cyst, and other too small to characterize liver lesions felt to most likely be benign.  4. History of intermittent abdominal pain and  rectal bleeding secondary to the descending colon mass.  5. Port-A-Cath placement 06/02/2011, status post removal of the left-sided Port-A-Cath on 07/17/2011 and placement of a right chest Port-A-Cath on 07/17/2011.  6. Admission 07/19/2011 with bilateral pneumothoraces.  7. Left upper extremity deep vein thrombosis associated with the original left-sided Port-A-Cath diagnosed on 07/04/2011, maintained on Coumadin. The INR is subtherapeutic . She will continue Coumadin at the current dose of 2.5 mg daily. We will increase the Coumadin dose if the INR is subtherapeutic in 2 weeks. 8. 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 She does not have neuropathy symptoms today per 9. Neutropenia secondary to chemotherapy, resolved.    Disposition:  She appears stable. The plan is to proceed with cycle 9 FOLFOX chemotherapy today. She will return for an office visit and chemotherapy on January 2.   Lucile Shutters, MD  10/28/2011  10:44 AM

## 2011-10-30 ENCOUNTER — Ambulatory Visit (HOSPITAL_BASED_OUTPATIENT_CLINIC_OR_DEPARTMENT_OTHER): Payer: BC Managed Care – PPO

## 2011-10-30 VITALS — BP 145/87 | HR 78 | Temp 97.7°F

## 2011-10-30 DIAGNOSIS — C189 Malignant neoplasm of colon, unspecified: Secondary | ICD-10-CM

## 2011-10-30 DIAGNOSIS — Z469 Encounter for fitting and adjustment of unspecified device: Secondary | ICD-10-CM

## 2011-10-30 MED ORDER — SODIUM CHLORIDE 0.9 % IJ SOLN
10.0000 mL | INTRAMUSCULAR | Status: DC | PRN
  Administered 2011-10-30: 10 mL
  Filled 2011-10-30: qty 10

## 2011-10-30 MED ORDER — HEPARIN SOD (PORK) LOCK FLUSH 100 UNIT/ML IV SOLN
500.0000 [IU] | Freq: Once | INTRAVENOUS | Status: AC | PRN
  Administered 2011-10-30: 500 [IU]
  Filled 2011-10-30: qty 5

## 2011-10-30 NOTE — Patient Instructions (Signed)
1251 Pt verbalized understanding of next appt date/time

## 2011-11-12 ENCOUNTER — Ambulatory Visit: Payer: BC Managed Care – PPO | Admitting: Nurse Practitioner

## 2011-11-12 ENCOUNTER — Telehealth: Payer: Self-pay | Admitting: Oncology

## 2011-11-12 ENCOUNTER — Other Ambulatory Visit: Payer: Self-pay | Admitting: Oncology

## 2011-11-12 ENCOUNTER — Other Ambulatory Visit (HOSPITAL_BASED_OUTPATIENT_CLINIC_OR_DEPARTMENT_OTHER): Payer: BC Managed Care – PPO | Admitting: Lab

## 2011-11-12 ENCOUNTER — Encounter: Payer: Self-pay | Admitting: *Deleted

## 2011-11-12 VITALS — BP 148/91 | HR 77 | Temp 97.8°F | Ht 69.5 in | Wt 219.7 lb

## 2011-11-12 DIAGNOSIS — C189 Malignant neoplasm of colon, unspecified: Secondary | ICD-10-CM

## 2011-11-12 DIAGNOSIS — I82629 Acute embolism and thrombosis of deep veins of unspecified upper extremity: Secondary | ICD-10-CM

## 2011-11-12 LAB — CBC WITH DIFFERENTIAL/PLATELET
BASO%: 0.6 % (ref 0.0–2.0)
Basophils Absolute: 0 10*3/uL (ref 0.0–0.1)
EOS%: 2 % (ref 0.0–7.0)
Eosinophils Absolute: 0.1 10*3/uL (ref 0.0–0.5)
HCT: 28.8 % — ABNORMAL LOW (ref 34.8–46.6)
HGB: 9.2 g/dL — ABNORMAL LOW (ref 11.6–15.9)
LYMPH%: 37.8 % (ref 14.0–49.7)
MCH: 23.3 pg — ABNORMAL LOW (ref 25.1–34.0)
MCHC: 31.9 g/dL (ref 31.5–36.0)
MCV: 72.9 fL — ABNORMAL LOW (ref 79.5–101.0)
MONO#: 0.5 10*3/uL (ref 0.1–0.9)
MONO%: 12.6 % (ref 0.0–14.0)
NEUT#: 1.7 10*3/uL (ref 1.5–6.5)
NEUT%: 47 % (ref 38.4–76.8)
Platelets: 170 10*3/uL (ref 145–400)
RBC: 3.95 10*6/uL (ref 3.70–5.45)
RDW: 21.6 % — ABNORMAL HIGH (ref 11.2–14.5)
WBC: 3.6 10*3/uL — ABNORMAL LOW (ref 3.9–10.3)
lymph#: 1.4 10*3/uL (ref 0.9–3.3)
nRBC: 0 % (ref 0–0)

## 2011-11-12 LAB — COMPREHENSIVE METABOLIC PANEL
ALT: 14 U/L (ref 0–35)
AST: 14 U/L (ref 0–37)
Albumin: 3.3 g/dL — ABNORMAL LOW (ref 3.5–5.2)
Alkaline Phosphatase: 89 U/L (ref 39–117)
BUN: 10 mg/dL (ref 6–23)
CO2: 29 mEq/L (ref 19–32)
Calcium: 9.1 mg/dL (ref 8.4–10.5)
Chloride: 102 mEq/L (ref 96–112)
Creatinine, Ser: 1.21 mg/dL — ABNORMAL HIGH (ref 0.50–1.10)
Glucose, Bld: 108 mg/dL — ABNORMAL HIGH (ref 70–99)
Potassium: 3.3 mEq/L — ABNORMAL LOW (ref 3.5–5.3)
Sodium: 138 mEq/L (ref 135–145)
Total Bilirubin: 0.4 mg/dL (ref 0.3–1.2)
Total Protein: 6.7 g/dL (ref 6.0–8.3)

## 2011-11-12 LAB — PROTIME-INR
INR: 1.5 — ABNORMAL LOW (ref 2.00–3.50)
Protime: 18 Seconds — ABNORMAL HIGH (ref 10.6–13.4)

## 2011-11-12 NOTE — Progress Notes (Signed)
PT/INR 1.5--changed Coumadin to 2.5 mg daily except 5 mg on Wed & Sun. Will recheck on 11/17/11

## 2011-11-12 NOTE — Progress Notes (Signed)
OFFICE PROGRESS NOTE  Interval history:  Mary [name] returns as scheduled. She completed cycle 9 FOLFOX 10/28/2011. She denies nausea/vomiting. No mouth sores. No diarrhea. Cold sensitivity lasted for "a couple of days". Hands and feet have felt consistently numb since treatment on 10/28/2011. The numbness does not interfere with activity. She became very fatigued following the most recent chemotherapy lasting for several days.   Objective: Blood pressure 148/91, pulse 77, temperature 97.8 F (36.6 C), temperature source Oral, height 5' 9.5" (1.765 m), weight 219 lb 11.2 oz (99.655 kg).  Oropharynx is without thrush or ulceration. Lungs are clear. No wheezes or rales. Regular cardiac rhythm. Port-A-Cath site is without erythema. Abdomen is soft and nontender. No hepatomegaly. Extremities are without edema. Calves are soft and nontender. Vibratory sense is intact over the fingertips per tuning fork exam.   Lab Results: Lab Results  Component Value Date   WBC 3.6* 11/12/2011   HGB 9.2* 11/12/2011   HCT 28.8* 11/12/2011   MCV 72.9* 11/12/2011   PLT 170 11/12/2011    Chemistry:    Chemistry      Component Value Date/Time   NA 138 10/28/2011 0947   K 4.0 10/28/2011 0947   CL 105 10/28/2011 0947   CO2 25 10/28/2011 0947   BUN 13 10/28/2011 0947   CREATININE 0.94 10/28/2011 0947      Component Value Date/Time   CALCIUM 9.2 10/28/2011 0947   ALKPHOS 90 10/28/2011 0947   AST 22 10/28/2011 0947   ALT 21 10/28/2011 0947   BILITOT 0.6 10/28/2011 0947       Studies/Results: No results found.  Medications: I have reviewed the patient's current medications.  Assessment/Plan:  1.Stage III (pT2 pN2b) adenocarcinoma of the descending/sigmoid colon, status post a partial colectomy 05/12/2011, status post biopsy of a mass at 30 cm at the time of a colonoscopy 04/14/2011 with the pathology revealing a tubulovillous adenoma with high-grade dysplasia. She began adjuvant FOLFOX chemotherapy  06/10/2011. She has completed 9 cycles to date.  2. Iron deficiency anemia-she is taking iron twice daily. The MCV continues to be low. Ferritin on 09/29/2011 was in normal range at 75.  3. Indeterminate liver and renal lesions on an abdominal CT 04/14/2011, status post an MRI of the abdomen 05/09/2011 consistent with multiple liver hemangiomas, renal cyst, and other too small to characterize liver lesions felt to most likely be benign.  4. History of intermittent abdominal pain and rectal bleeding secondary to the descending colon mass.  5. Port-A-Cath placement 06/02/2011, status post removal of the left-sided Port-A-Cath on 07/17/2011 and placement of a right chest Port-A-Cath on 07/17/2011.  6. Admission 07/19/2011 with bilateral pneumothoraces.  7. Left upper extremity deep vein thrombosis associated with the original left-sided Port-A-Cath diagnosed on 07/04/2011, maintained on Coumadin. The INR is subtherapeutic . We will increase the Coumadin dose from 2.5 mg daily to 5 mg on Wednesdays and Sundays and 2.5 mg all other days. She will return for a followup PT/INR on 11/17/2011.  8. 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 She complains of consistent numbness in the hands and feet since treatment on 10/28/2011. 9. History of neutropenia secondary to chemotherapy, resolved.  Disposition-Ms. Wormley has completed 9 cycles of FOLFOX chemotherapy. She reports numbness in the hands and feet since treatment on 10/28/2011. We will hold oxaliplatin with cycle 10 (scheduled 11/13/2011). She will return for a followup visit and cycle 11 FOLFOX 11/25/2011. She will contact the office in the interim with any problems.  Lonna Cobb ANP/GNP-BC

## 2011-11-12 NOTE — Telephone Encounter (Signed)
appts made 2/5 w/ dr gbs lab and folfox,pump d/c on 2/7 and movesd appt w/ ls  To 11:45,printed for pt   aom

## 2011-11-13 ENCOUNTER — Ambulatory Visit (HOSPITAL_BASED_OUTPATIENT_CLINIC_OR_DEPARTMENT_OTHER): Payer: BC Managed Care – PPO

## 2011-11-13 VITALS — BP 151/92 | HR 63 | Temp 97.7°F

## 2011-11-13 DIAGNOSIS — C186 Malignant neoplasm of descending colon: Secondary | ICD-10-CM

## 2011-11-13 DIAGNOSIS — C189 Malignant neoplasm of colon, unspecified: Secondary | ICD-10-CM

## 2011-11-13 DIAGNOSIS — Z5111 Encounter for antineoplastic chemotherapy: Secondary | ICD-10-CM

## 2011-11-13 MED ORDER — LEUCOVORIN CALCIUM INJECTION 350 MG
400.0000 mg/m2 | Freq: Once | INTRAVENOUS | Status: AC
  Administered 2011-11-13: 884 mg via INTRAVENOUS
  Filled 2011-11-13: qty 44.2

## 2011-11-13 MED ORDER — SODIUM CHLORIDE 0.9 % IV SOLN
2400.0000 mg/m2 | INTRAVENOUS | Status: DC
  Administered 2011-11-13: 5300 mg via INTRAVENOUS
  Filled 2011-11-13: qty 106

## 2011-11-13 MED ORDER — DEXTROSE 5 % IV SOLN
Freq: Once | INTRAVENOUS | Status: AC
  Administered 2011-11-13: 100 mL via INTRAVENOUS

## 2011-11-13 MED ORDER — FLUOROURACIL CHEMO INJECTION 2.5 GM/50ML
400.0000 mg/m2 | Freq: Once | INTRAVENOUS | Status: AC
  Administered 2011-11-13: 900 mg via INTRAVENOUS
  Filled 2011-11-13: qty 18

## 2011-11-14 ENCOUNTER — Other Ambulatory Visit: Payer: Self-pay | Admitting: Certified Registered Nurse Anesthetist

## 2011-11-15 ENCOUNTER — Ambulatory Visit (HOSPITAL_BASED_OUTPATIENT_CLINIC_OR_DEPARTMENT_OTHER): Payer: BC Managed Care – PPO

## 2011-11-15 VITALS — BP 133/75 | HR 75 | Temp 97.4°F

## 2011-11-15 DIAGNOSIS — C189 Malignant neoplasm of colon, unspecified: Secondary | ICD-10-CM

## 2011-11-15 DIAGNOSIS — C186 Malignant neoplasm of descending colon: Secondary | ICD-10-CM

## 2011-11-15 MED ORDER — SODIUM CHLORIDE 0.9 % IJ SOLN
10.0000 mL | INTRAMUSCULAR | Status: DC | PRN
  Administered 2011-11-15: 10 mL
  Filled 2011-11-15: qty 10

## 2011-11-15 MED ORDER — HEPARIN SOD (PORK) LOCK FLUSH 100 UNIT/ML IV SOLN
500.0000 [IU] | Freq: Once | INTRAVENOUS | Status: AC | PRN
  Administered 2011-11-15: 500 [IU]
  Filled 2011-11-15: qty 5

## 2011-11-24 ENCOUNTER — Other Ambulatory Visit: Payer: Self-pay | Admitting: Oncology

## 2011-11-25 ENCOUNTER — Other Ambulatory Visit (HOSPITAL_BASED_OUTPATIENT_CLINIC_OR_DEPARTMENT_OTHER): Payer: BC Managed Care – PPO | Admitting: Lab

## 2011-11-25 ENCOUNTER — Other Ambulatory Visit: Payer: Self-pay | Admitting: Oncology

## 2011-11-25 ENCOUNTER — Ambulatory Visit (HOSPITAL_BASED_OUTPATIENT_CLINIC_OR_DEPARTMENT_OTHER): Payer: BC Managed Care – PPO | Admitting: Nurse Practitioner

## 2011-11-25 ENCOUNTER — Ambulatory Visit (HOSPITAL_BASED_OUTPATIENT_CLINIC_OR_DEPARTMENT_OTHER): Payer: BC Managed Care – PPO

## 2011-11-25 ENCOUNTER — Ambulatory Visit: Payer: BC Managed Care – PPO | Admitting: Nurse Practitioner

## 2011-11-25 ENCOUNTER — Other Ambulatory Visit: Payer: BC Managed Care – PPO | Admitting: Lab

## 2011-11-25 ENCOUNTER — Other Ambulatory Visit: Payer: Self-pay | Admitting: Nurse Practitioner

## 2011-11-25 ENCOUNTER — Other Ambulatory Visit: Payer: Self-pay | Admitting: *Deleted

## 2011-11-25 VITALS — BP 147/84 | HR 84

## 2011-11-25 VITALS — BP 162/101 | HR 69 | Temp 97.0°F | Ht 69.5 in | Wt 219.4 lb

## 2011-11-25 DIAGNOSIS — I82629 Acute embolism and thrombosis of deep veins of unspecified upper extremity: Secondary | ICD-10-CM

## 2011-11-25 DIAGNOSIS — C189 Malignant neoplasm of colon, unspecified: Secondary | ICD-10-CM

## 2011-11-25 DIAGNOSIS — Z5111 Encounter for antineoplastic chemotherapy: Secondary | ICD-10-CM

## 2011-11-25 DIAGNOSIS — C186 Malignant neoplasm of descending colon: Secondary | ICD-10-CM

## 2011-11-25 LAB — CBC WITH DIFFERENTIAL/PLATELET
BASO%: 0.4 % (ref 0.0–2.0)
Basophils Absolute: 0 10*3/uL (ref 0.0–0.1)
EOS%: 0.7 % (ref 0.0–7.0)
Eosinophils Absolute: 0 10*3/uL (ref 0.0–0.5)
HCT: 29.9 % — ABNORMAL LOW (ref 34.8–46.6)
HGB: 9.7 g/dL — ABNORMAL LOW (ref 11.6–15.9)
LYMPH%: 38.3 % (ref 14.0–49.7)
MCH: 23.7 pg — ABNORMAL LOW (ref 25.1–34.0)
MCHC: 32.4 g/dL (ref 31.5–36.0)
MCV: 73.1 fL — ABNORMAL LOW (ref 79.5–101.0)
MONO#: 0.5 10*3/uL (ref 0.1–0.9)
MONO%: 9.5 % (ref 0.0–14.0)
NEUT#: 2.8 10*3/uL (ref 1.5–6.5)
NEUT%: 51.1 % (ref 38.4–76.8)
Platelets: 157 10*3/uL (ref 145–400)
RBC: 4.09 10*6/uL (ref 3.70–5.45)
RDW: 21.3 % — ABNORMAL HIGH (ref 11.2–14.5)
WBC: 5.5 10*3/uL (ref 3.9–10.3)
lymph#: 2.1 10*3/uL (ref 0.9–3.3)
nRBC: 0 % (ref 0–0)

## 2011-11-25 LAB — MAGNESIUM: Magnesium: 2 mg/dL (ref 1.5–2.5)

## 2011-11-25 LAB — TECHNOLOGIST REVIEW

## 2011-11-25 LAB — COMPREHENSIVE METABOLIC PANEL
ALT: 13 U/L (ref 0–35)
AST: 16 U/L (ref 0–37)
Albumin: 3.8 g/dL (ref 3.5–5.2)
Alkaline Phosphatase: 96 U/L (ref 39–117)
BUN: 12 mg/dL (ref 6–23)
CO2: 24 mEq/L (ref 19–32)
Calcium: 9.6 mg/dL (ref 8.4–10.5)
Chloride: 103 mEq/L (ref 96–112)
Creatinine, Ser: 1.08 mg/dL (ref 0.50–1.10)
Glucose, Bld: 108 mg/dL — ABNORMAL HIGH (ref 70–99)
Potassium: 3.8 mEq/L (ref 3.5–5.3)
Sodium: 137 mEq/L (ref 135–145)
Total Bilirubin: 0.3 mg/dL (ref 0.3–1.2)
Total Protein: 7.5 g/dL (ref 6.0–8.3)

## 2011-11-25 LAB — PROTIME-INR
INR: 1.9 — ABNORMAL LOW (ref 2.00–3.50)
Protime: 22.8 Seconds — ABNORMAL HIGH (ref 10.6–13.4)

## 2011-11-25 MED ORDER — FLUOROURACIL CHEMO INJECTION 2.5 GM/50ML
400.0000 mg/m2 | Freq: Once | INTRAVENOUS | Status: AC
  Administered 2011-11-25: 900 mg via INTRAVENOUS
  Filled 2011-11-25: qty 18

## 2011-11-25 MED ORDER — POTASSIUM CHLORIDE CRYS ER 20 MEQ PO TBCR: 20.0000 meq | EXTENDED_RELEASE_TABLET | Freq: Every day | ORAL | Status: DC

## 2011-11-25 MED ORDER — HEPARIN SOD (PORK) LOCK FLUSH 100 UNIT/ML IV SOLN
500.0000 [IU] | Freq: Once | INTRAVENOUS | Status: DC | PRN
  Filled 2011-11-25: qty 5

## 2011-11-25 MED ORDER — DEXTROSE 5 % IV SOLN
400.0000 mg/m2 | Freq: Once | INTRAVENOUS | Status: AC
  Administered 2011-11-25: 884 mg via INTRAVENOUS
  Filled 2011-11-25: qty 44.2

## 2011-11-25 MED ORDER — SODIUM CHLORIDE 0.9 % IV SOLN
2400.0000 mg/m2 | INTRAVENOUS | Status: DC
  Administered 2011-11-25: 5300 mg via INTRAVENOUS
  Filled 2011-11-25: qty 106

## 2011-11-25 MED ORDER — SODIUM CHLORIDE 0.9 % IJ SOLN
10.0000 mL | INTRAMUSCULAR | Status: DC | PRN
  Filled 2011-11-25: qty 10

## 2011-11-25 NOTE — Progress Notes (Signed)
OFFICE PROGRESS NOTE  Interval history:  Mary [name] returns as scheduled. She completed cycle 10 of FOLFOX 11/13/2011. Oxaliplatin was held due to neuropathy symptoms.  Mary [name] reports persistent numbness in the fingertips and toes. She notes that her feet are red and intermittently painful. No nausea or vomiting. No mouth sores. No diarrhea.   Objective: Blood pressure 162/101, pulse 69, temperature 97 F (36.1 C), temperature source Oral, height 5' 9.5" (1.765 m), weight 219 lb 6.4 oz (99.519 kg).  Oropharynx is without thrush or ulceration. Lungs are clear. No wheezes or rales. Regular cardiac rhythm. Port-A-Cath site is without erythema. Abdomen is soft and nontender. No hepatomegaly. Extremities are without edema. Palms and soles with mild erythema and scattered areas of hyperpigmentation. There is no skin breakdown. Vibratory sense is intact over the fingertips per tuning fork exam.  Lab Results: Lab Results  Component Value Date   WBC 5.5 11/25/2011   HGB 9.7* 11/25/2011   HCT 29.9* 11/25/2011   MCV 73.1* 11/25/2011   PLT 157 11/25/2011    Chemistry:    Chemistry      Component Value Date/Time   NA 138 11/12/2011 1009   K 3.3* 11/12/2011 1009   CL 102 11/12/2011 1009   CO2 29 11/12/2011 1009   BUN 10 11/12/2011 1009   CREATININE 1.21* 11/12/2011 1009      Component Value Date/Time   CALCIUM 9.1 11/12/2011 1009   ALKPHOS 89 11/12/2011 1009   AST 14 11/12/2011 1009   ALT 14 11/12/2011 1009   BILITOT 0.4 11/12/2011 1009       Studies/Results: No results found.  Medications: I have reviewed the patient's current medications.  Assessment/Plan:  1.Stage III (pT2 pN2b) adenocarcinoma of the descending/sigmoid colon, status post a partial colectomy 05/12/2011, status post biopsy of a mass at 30 cm at the time of a colonoscopy 04/14/2011 with the pathology revealing a tubulovillous adenoma with high-grade dysplasia. She began adjuvant FOLFOX chemotherapy 06/10/2011. She has completed 10  cycles to date. Oxaliplatin was held beginning with cycle 10 due to neuropathy symptoms. 2. Iron deficiency anemia-she is taking iron twice daily. The MCV continues to be low. Ferritin on 09/29/2011 was in normal range at 75.  3. Indeterminate liver and renal lesions on an abdominal CT 04/14/2011, status post an MRI of the abdomen 05/09/2011 consistent with multiple liver hemangiomas, renal cyst, and other too small to characterize liver lesions felt to most likely be benign.  4. History of intermittent abdominal pain and rectal bleeding secondary to the descending colon mass.  5. Port-A-Cath placement 06/02/2011, status post removal of the left-sided Port-A-Cath on 07/17/2011 and placement of a right chest Port-A-Cath on 07/17/2011.  6. Admission 07/19/2011 with bilateral pneumothoraces.  7. Left upper extremity deep vein thrombosis associated with the original left-sided Port-A-Cath diagnosed on 07/04/2011, maintained on Coumadin. She will continue Coumadin 5 mg on Wednesdays and Sundays and 2.5 mg all other days.  8. 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 She has persistent numbness in the hands and feet. We will continue to hold oxaliplatin.  9. History of neutropenia secondary to chemotherapy, resolved. 10. Mild hand-foot syndrome secondary to 5-fluorouracil. 11. Hypokalemia-question etiology. We will check a magnesium level. She will continue K-Dur 20 milliequivalents daily.  Disposition-Mary [name] has completed 10 cycles of FOLFOX chemotherapy. Oxaliplatin was placed on hold beginning with cycle 10 due to neuropathy symptoms. She has persistent numbness in the hands and feet. We will continue to hold oxaliplatin. Plan to proceed  with cycle 11 FOLFOX as scheduled otherwise. She will return for the 12th and final cycle of FOLFOX chemotherapy on 12/16/2011. She will contact the office the interim with any problems.  Plan reviewed with Dr. Truett Perna.   Lonna Cobb  ANP/GNP-BC

## 2011-11-27 ENCOUNTER — Ambulatory Visit (HOSPITAL_BASED_OUTPATIENT_CLINIC_OR_DEPARTMENT_OTHER): Payer: BC Managed Care – PPO

## 2011-11-27 VITALS — BP 152/93 | HR 76 | Temp 98.4°F

## 2011-11-27 DIAGNOSIS — C189 Malignant neoplasm of colon, unspecified: Secondary | ICD-10-CM

## 2011-11-27 MED ORDER — SODIUM CHLORIDE 0.9 % IJ SOLN
10.0000 mL | INTRAMUSCULAR | Status: DC | PRN
  Filled 2011-11-27: qty 10

## 2011-11-27 MED ORDER — HEPARIN SOD (PORK) LOCK FLUSH 100 UNIT/ML IV SOLN
500.0000 [IU] | Freq: Once | INTRAVENOUS | Status: DC | PRN
  Filled 2011-11-27: qty 5

## 2011-11-28 ENCOUNTER — Other Ambulatory Visit: Payer: Self-pay | Admitting: Certified Registered Nurse Anesthetist

## 2011-12-13 ENCOUNTER — Other Ambulatory Visit: Payer: Self-pay | Admitting: Oncology

## 2011-12-16 ENCOUNTER — Ambulatory Visit (HOSPITAL_BASED_OUTPATIENT_CLINIC_OR_DEPARTMENT_OTHER): Payer: BC Managed Care – PPO | Admitting: Nurse Practitioner

## 2011-12-16 ENCOUNTER — Ambulatory Visit: Payer: BC Managed Care – PPO | Admitting: Oncology

## 2011-12-16 ENCOUNTER — Other Ambulatory Visit (HOSPITAL_BASED_OUTPATIENT_CLINIC_OR_DEPARTMENT_OTHER): Payer: BC Managed Care – PPO | Admitting: Lab

## 2011-12-16 ENCOUNTER — Ambulatory Visit (HOSPITAL_BASED_OUTPATIENT_CLINIC_OR_DEPARTMENT_OTHER): Payer: BC Managed Care – PPO

## 2011-12-16 ENCOUNTER — Other Ambulatory Visit: Payer: BC Managed Care – PPO | Admitting: Lab

## 2011-12-16 DIAGNOSIS — Z86718 Personal history of other venous thrombosis and embolism: Secondary | ICD-10-CM

## 2011-12-16 DIAGNOSIS — C186 Malignant neoplasm of descending colon: Secondary | ICD-10-CM

## 2011-12-16 DIAGNOSIS — C189 Malignant neoplasm of colon, unspecified: Secondary | ICD-10-CM

## 2011-12-16 DIAGNOSIS — I82629 Acute embolism and thrombosis of deep veins of unspecified upper extremity: Secondary | ICD-10-CM

## 2011-12-16 DIAGNOSIS — E876 Hypokalemia: Secondary | ICD-10-CM

## 2011-12-16 DIAGNOSIS — Z5111 Encounter for antineoplastic chemotherapy: Secondary | ICD-10-CM

## 2011-12-16 DIAGNOSIS — G620 Drug-induced polyneuropathy: Secondary | ICD-10-CM

## 2011-12-16 LAB — CBC WITH DIFFERENTIAL/PLATELET
BASO%: 0.4 % (ref 0.0–2.0)
Basophils Absolute: 0 10*3/uL (ref 0.0–0.1)
EOS%: 0.9 % (ref 0.0–7.0)
Eosinophils Absolute: 0.1 10*3/uL (ref 0.0–0.5)
HCT: 32.1 % — ABNORMAL LOW (ref 34.8–46.6)
HGB: 10.4 g/dL — ABNORMAL LOW (ref 11.6–15.9)
LYMPH%: 42.7 % (ref 14.0–49.7)
MCH: 24.2 pg — ABNORMAL LOW (ref 25.1–34.0)
MCHC: 32.4 g/dL (ref 31.5–36.0)
MCV: 74.7 fL — ABNORMAL LOW (ref 79.5–101.0)
MONO#: 0.5 10*3/uL (ref 0.1–0.9)
MONO%: 8.9 % (ref 0.0–14.0)
NEUT#: 2.5 10*3/uL (ref 1.5–6.5)
NEUT%: 47.1 % (ref 38.4–76.8)
Platelets: 155 10*3/uL (ref 145–400)
RBC: 4.3 10*6/uL (ref 3.70–5.45)
RDW: 20.6 % — ABNORMAL HIGH (ref 11.2–14.5)
WBC: 5.3 10*3/uL (ref 3.9–10.3)
lymph#: 2.3 10*3/uL (ref 0.9–3.3)
nRBC: 0 % (ref 0–0)

## 2011-12-16 LAB — PROTIME-INR
INR: 1.8 — ABNORMAL LOW (ref 2.00–3.50)
Protime: 21.6 Seconds — ABNORMAL HIGH (ref 10.6–13.4)

## 2011-12-16 LAB — COMPREHENSIVE METABOLIC PANEL
ALT: 15 U/L (ref 0–35)
AST: 18 U/L (ref 0–37)
Albumin: 3.7 g/dL (ref 3.5–5.2)
Alkaline Phosphatase: 92 U/L (ref 39–117)
BUN: 15 mg/dL (ref 6–23)
CO2: 26 mEq/L (ref 19–32)
Calcium: 9.8 mg/dL (ref 8.4–10.5)
Chloride: 104 mEq/L (ref 96–112)
Creatinine, Ser: 1.12 mg/dL — ABNORMAL HIGH (ref 0.50–1.10)
Glucose, Bld: 98 mg/dL (ref 70–99)
Potassium: 4.2 mEq/L (ref 3.5–5.3)
Sodium: 137 mEq/L (ref 135–145)
Total Bilirubin: 0.4 mg/dL (ref 0.3–1.2)
Total Protein: 7.7 g/dL (ref 6.0–8.3)

## 2011-12-16 MED ORDER — LEUCOVORIN CALCIUM INJECTION 350 MG
400.0000 mg/m2 | Freq: Once | INTRAVENOUS | Status: AC
  Administered 2011-12-16: 884 mg via INTRAVENOUS
  Filled 2011-12-16: qty 44.2

## 2011-12-16 MED ORDER — SODIUM CHLORIDE 0.9 % IV SOLN
2400.0000 mg/m2 | INTRAVENOUS | Status: DC
  Administered 2011-12-16: 5300 mg via INTRAVENOUS
  Filled 2011-12-16: qty 106

## 2011-12-16 MED ORDER — DEXTROSE 5 % IV SOLN: Freq: Once | INTRAVENOUS | Status: DC

## 2011-12-16 MED ORDER — FLUOROURACIL CHEMO INJECTION 2.5 GM/50ML
400.0000 mg/m2 | Freq: Once | INTRAVENOUS | Status: AC
  Administered 2011-12-16: 900 mg via INTRAVENOUS
  Filled 2011-12-16: qty 18

## 2011-12-16 NOTE — Progress Notes (Signed)
OFFICE PROGRESS NOTE  Interval history:  Mary [name] returns as scheduled. She completed cycle 11 FOLFOX 11/25/2011. Oxaliplatin was placed on hold beginning with cycle 10 due to neuropathy symptoms.  Mary [name] reports that she feels well. No nausea/vomiting. No mouth sores. No diarrhea. She notes increased numbness in the fingertips and feet. The numbness does not interfere with activity.   Objective: Blood pressure 132/93, pulse 75, temperature 98.1 F (36.7 C), temperature source Oral, height 5' 9.5" (1.765 m), weight 221 lb 8 oz (100.472 kg).  Oropharynx is without thrush or ulceration. Lungs are clear. Regular cardiac rhythm. Port-A-Cath site is without erythema. Abdomen is soft and nontender. No hepatomegaly. Extremities are without edema. Vibratory sense is mildly to moderately decreased over the fingertips per tuning fork exam.  Lab Results: Lab Results  Component Value Date   WBC 5.3 12/16/2011   HGB 10.4* 12/16/2011   HCT 32.1* 12/16/2011   MCV 74.7* 12/16/2011   PLT 155 12/16/2011    Chemistry:    Chemistry      Component Value Date/Time   NA 137 11/25/2011 1125   K 3.8 11/25/2011 1125   CL 103 11/25/2011 1125   CO2 24 11/25/2011 1125   BUN 12 11/25/2011 1125   CREATININE 1.08 11/25/2011 1125      Component Value Date/Time   CALCIUM 9.6 11/25/2011 1125   ALKPHOS 96 11/25/2011 1125   AST 16 11/25/2011 1125   ALT 13 11/25/2011 1125   BILITOT 0.3 11/25/2011 1125       Studies/Results: No results found.  Medications: I have reviewed the patient's current medications.  Assessment/Plan:  1.Stage III (pT2 pN2b) adenocarcinoma of the descending/sigmoid colon, status post a partial colectomy 05/12/2011, status post biopsy of a mass at 30 cm at the time of a colonoscopy 04/14/2011 with the pathology revealing a tubulovillous adenoma with high-grade dysplasia. She began adjuvant FOLFOX chemotherapy 06/10/2011. She has completed 11 cycles to date. Oxaliplatin was held beginning with cycle  10 due to neuropathy symptoms.  2. Iron deficiency anemia-she is taking iron twice daily. The MCV continues to be low. Ferritin on 09/29/2011 was in normal range at 75.  3. Indeterminate liver and renal lesions on an abdominal CT 04/14/2011, status post an MRI of the abdomen 05/09/2011 consistent with multiple liver hemangiomas, renal cyst, and other too small to characterize liver lesions felt to most likely be benign.  4. History of intermittent abdominal pain and rectal bleeding secondary to the descending colon mass.  5. Port-A-Cath placement 06/02/2011, status post removal of the left-sided Port-A-Cath on 07/17/2011 and placement of a right chest Port-A-Cath on 07/17/2011.  6. Admission 07/19/2011 with bilateral pneumothoraces.  7. Left upper extremity deep vein thrombosis associated with the original left-sided Port-A-Cath diagnosed on 07/04/2011, maintained on Coumadin. She will continue Coumadin 5 mg on Wednesdays and Sundays and 2.5 mg all other days.  8. 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 She has increased numbness in the hands and feet. We will continue to hold oxaliplatin.  9. History of neutropenia secondary to chemotherapy, resolved.  10. Mild hand-foot syndrome secondary to 5-fluorouracil.  11. Hypokalemia-question etiology. Magnesium was in normal range on 11/25/2011. She will continue K-Dur 20 milliequivalents daily. We are repeating a potassium level today.  Disposition-Mary [name] appears stable. Plan to proceed with the 12th and final cycle of FOLFOX chemotherapy today as scheduled. We will continue to hold oxaliplatin due to neuropathy symptoms. She will return for a followup PT/INR in 2 weeks. She  will return for a followup visit with Dr. Truett Perna 01/19/2012. She will contact the office the interim with any problems.   Lonna Cobb ANP/GNP-BC

## 2011-12-18 ENCOUNTER — Ambulatory Visit (HOSPITAL_BASED_OUTPATIENT_CLINIC_OR_DEPARTMENT_OTHER): Payer: BC Managed Care – PPO

## 2011-12-18 VITALS — BP 157/85 | HR 82 | Temp 98.1°F

## 2011-12-18 DIAGNOSIS — C189 Malignant neoplasm of colon, unspecified: Secondary | ICD-10-CM

## 2011-12-18 DIAGNOSIS — Z469 Encounter for fitting and adjustment of unspecified device: Secondary | ICD-10-CM

## 2011-12-18 MED ORDER — HEPARIN SOD (PORK) LOCK FLUSH 100 UNIT/ML IV SOLN
500.0000 [IU] | Freq: Once | INTRAVENOUS | Status: AC | PRN
  Administered 2011-12-18: 500 [IU]
  Filled 2011-12-18: qty 5

## 2011-12-18 MED ORDER — SODIUM CHLORIDE 0.9 % IJ SOLN
10.0000 mL | INTRAMUSCULAR | Status: DC | PRN
  Administered 2011-12-18: 10 mL
  Filled 2011-12-18: qty 10

## 2011-12-30 ENCOUNTER — Encounter: Payer: Self-pay | Admitting: *Deleted

## 2011-12-30 ENCOUNTER — Telehealth: Payer: Self-pay | Admitting: *Deleted

## 2011-12-30 ENCOUNTER — Other Ambulatory Visit: Payer: Self-pay | Admitting: *Deleted

## 2011-12-30 ENCOUNTER — Other Ambulatory Visit (HOSPITAL_BASED_OUTPATIENT_CLINIC_OR_DEPARTMENT_OTHER): Payer: BC Managed Care – PPO | Admitting: Lab

## 2011-12-30 DIAGNOSIS — I82629 Acute embolism and thrombosis of deep veins of unspecified upper extremity: Secondary | ICD-10-CM

## 2011-12-30 DIAGNOSIS — C189 Malignant neoplasm of colon, unspecified: Secondary | ICD-10-CM

## 2011-12-30 LAB — PROTIME-INR
INR: 1.6 — ABNORMAL LOW (ref 2.00–3.50)
Protime: 19.2 Seconds — ABNORMAL HIGH (ref 10.6–13.4)

## 2011-12-30 MED ORDER — AMITRIPTYLINE HCL 25 MG PO TABS: 25.0000 mg | ORAL_TABLET | Freq: Every day | ORAL | Status: DC

## 2011-12-30 NOTE — Telephone Encounter (Signed)
Patient came to office for labs and asking nurse for something for the pain in her feet and hands--rated "9". Burning, stinging pain with numbness. Asking when is next CT scan due?  Per Dr. Truett Perna: Will try Elavil 25 mg nightly. CT scan 1 year from initial scan. Increase coumadin to 5 mg on MWF and 2.5 mg all other days and recheck PT in 2 weeks.

## 2011-12-31 ENCOUNTER — Telehealth: Payer: Self-pay | Admitting: Oncology

## 2011-12-31 NOTE — Telephone Encounter (Signed)
called pt and informed her of lab appt for 03/06

## 2012-01-13 ENCOUNTER — Telehealth: Payer: Self-pay | Admitting: *Deleted

## 2012-01-13 NOTE — Telephone Encounter (Signed)
Patient called to report the Elavil has not been effective at all in helping her pain (buring, stinging) in her hands and feet. Has been taking since 12/30/11. She is requesting change to Neurontin. Will ask MD.

## 2012-01-14 ENCOUNTER — Other Ambulatory Visit (HOSPITAL_BASED_OUTPATIENT_CLINIC_OR_DEPARTMENT_OTHER): Payer: BC Managed Care – PPO | Admitting: Lab

## 2012-01-14 ENCOUNTER — Telehealth: Payer: Self-pay | Admitting: *Deleted

## 2012-01-14 DIAGNOSIS — M792 Neuralgia and neuritis, unspecified: Secondary | ICD-10-CM

## 2012-01-14 DIAGNOSIS — I82629 Acute embolism and thrombosis of deep veins of unspecified upper extremity: Secondary | ICD-10-CM

## 2012-01-14 LAB — PROTIME-INR
INR: 2.3 (ref 2.00–3.50)
Protime: 27.6 Seconds — ABNORMAL HIGH (ref 10.6–13.4)

## 2012-01-14 MED ORDER — GABAPENTIN 300 MG PO CAPS: 300.0000 mg | ORAL_CAPSULE | Freq: Two times a day (BID) | ORAL | Status: DC

## 2012-01-14 NOTE — Telephone Encounter (Signed)
Notified patient that Dr. Truett Perna approved change to Neurontin 300 mg bid.

## 2012-01-18 ENCOUNTER — Emergency Department (HOSPITAL_COMMUNITY)
Admission: EM | Admit: 2012-01-18 | Discharge: 2012-01-18 | Disposition: A | Discharge status: Home / Self Care | Payer: PRIVATE HEALTH INSURANCE | Attending: Emergency Medicine | Admitting: Emergency Medicine

## 2012-01-18 ENCOUNTER — Emergency Department (HOSPITAL_COMMUNITY): Payer: PRIVATE HEALTH INSURANCE

## 2012-01-18 ENCOUNTER — Encounter (HOSPITAL_COMMUNITY): Payer: Self-pay | Admitting: Emergency Medicine

## 2012-01-18 DIAGNOSIS — L089 Local infection of the skin and subcutaneous tissue, unspecified: Secondary | ICD-10-CM | POA: Insufficient documentation

## 2012-01-18 DIAGNOSIS — M25529 Pain in unspecified elbow: Secondary | ICD-10-CM | POA: Insufficient documentation

## 2012-01-18 DIAGNOSIS — Z85038 Personal history of other malignant neoplasm of large intestine: Secondary | ICD-10-CM | POA: Insufficient documentation

## 2012-01-18 DIAGNOSIS — Z86718 Personal history of other venous thrombosis and embolism: Secondary | ICD-10-CM | POA: Insufficient documentation

## 2012-01-18 DIAGNOSIS — T148XXA Other injury of unspecified body region, initial encounter: Secondary | ICD-10-CM

## 2012-01-18 DIAGNOSIS — Z7901 Long term (current) use of anticoagulants: Secondary | ICD-10-CM | POA: Insufficient documentation

## 2012-01-18 DIAGNOSIS — L0390 Cellulitis, unspecified: Secondary | ICD-10-CM

## 2012-01-18 DIAGNOSIS — S20219A Contusion of unspecified front wall of thorax, initial encounter: Secondary | ICD-10-CM | POA: Insufficient documentation

## 2012-01-18 DIAGNOSIS — R079 Chest pain, unspecified: Secondary | ICD-10-CM | POA: Insufficient documentation

## 2012-01-18 DIAGNOSIS — S0003XA Contusion of scalp, initial encounter: Secondary | ICD-10-CM | POA: Insufficient documentation

## 2012-01-18 DIAGNOSIS — M542 Cervicalgia: Secondary | ICD-10-CM | POA: Insufficient documentation

## 2012-01-18 DIAGNOSIS — W57XXXA Bitten or stung by nonvenomous insect and other nonvenomous arthropods, initial encounter: Secondary | ICD-10-CM

## 2012-01-18 DIAGNOSIS — Z79899 Other long term (current) drug therapy: Secondary | ICD-10-CM | POA: Insufficient documentation

## 2012-01-18 DIAGNOSIS — M79609 Pain in unspecified limb: Secondary | ICD-10-CM | POA: Insufficient documentation

## 2012-01-18 DIAGNOSIS — S0083XA Contusion of other part of head, initial encounter: Secondary | ICD-10-CM

## 2012-01-18 DIAGNOSIS — IMO0002 Reserved for concepts with insufficient information to code with codable children: Secondary | ICD-10-CM | POA: Insufficient documentation

## 2012-01-18 DIAGNOSIS — R51 Headache: Secondary | ICD-10-CM | POA: Insufficient documentation

## 2012-01-18 LAB — PROTIME-INR
INR: 2.1 — ABNORMAL HIGH (ref 0.00–1.49)
Prothrombin Time: 23.9 seconds — ABNORMAL HIGH (ref 11.6–15.2)

## 2012-01-18 MED ORDER — HYDROCODONE-ACETAMINOPHEN 5-500 MG PO TABS: 1.0000 | ORAL_TABLET | Freq: Four times a day (QID) | ORAL | Status: AC | PRN

## 2012-01-18 MED ORDER — HYDROCODONE-ACETAMINOPHEN 5-325 MG PO TABS
2.0000 | ORAL_TABLET | Freq: Once | ORAL | Status: AC
  Administered 2012-01-18: 2 via ORAL
  Filled 2012-01-18: qty 2

## 2012-01-18 MED ORDER — CEPHALEXIN 500 MG PO CAPS: 500.0000 mg | ORAL_CAPSULE | Freq: Four times a day (QID) | ORAL | Status: AC

## 2012-01-18 NOTE — ED Notes (Signed)
Return from xray

## 2012-01-18 NOTE — ED Notes (Signed)
Pt transported to radiology.
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
## 2012-01-19 ENCOUNTER — Telehealth: Payer: Self-pay | Admitting: Oncology

## 2012-01-19 ENCOUNTER — Ambulatory Visit (HOSPITAL_BASED_OUTPATIENT_CLINIC_OR_DEPARTMENT_OTHER): Payer: BC Managed Care – PPO | Admitting: Oncology

## 2012-01-19 ENCOUNTER — Other Ambulatory Visit: Payer: BC Managed Care – PPO | Admitting: Lab

## 2012-01-19 ENCOUNTER — Other Ambulatory Visit: Payer: Self-pay | Admitting: *Deleted

## 2012-01-19 DIAGNOSIS — G620 Drug-induced polyneuropathy: Secondary | ICD-10-CM

## 2012-01-19 DIAGNOSIS — I82629 Acute embolism and thrombosis of deep veins of unspecified upper extremity: Secondary | ICD-10-CM

## 2012-01-19 DIAGNOSIS — T451X5A Adverse effect of antineoplastic and immunosuppressive drugs, initial encounter: Secondary | ICD-10-CM

## 2012-01-19 DIAGNOSIS — C189 Malignant neoplasm of colon, unspecified: Secondary | ICD-10-CM

## 2012-01-19 MED ORDER — LIDOCAINE-PRILOCAINE 2.5-2.5 % EX CREA: 1.0000 "application " | TOPICAL_CREAM | CUTANEOUS | Status: DC | PRN
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
## 2012-01-19 NOTE — Telephone Encounter (Signed)
called pt lmovm for appt for march-april2013

## 2012-01-20 ENCOUNTER — Other Ambulatory Visit: Payer: Self-pay | Admitting: *Deleted

## 2012-01-20 NOTE — Telephone Encounter (Signed)
Notified patient that Dr. Truett Perna said she may discontinue the K-tab.

## 2012-01-26 ENCOUNTER — Other Ambulatory Visit (HOSPITAL_BASED_OUTPATIENT_CLINIC_OR_DEPARTMENT_OTHER): Payer: BC Managed Care – PPO

## 2012-01-26 DIAGNOSIS — I82629 Acute embolism and thrombosis of deep veins of unspecified upper extremity: Secondary | ICD-10-CM

## 2012-01-26 DIAGNOSIS — C189 Malignant neoplasm of colon, unspecified: Secondary | ICD-10-CM

## 2012-01-26 LAB — BASIC METABOLIC PANEL
BUN: 14 mg/dL (ref 6–23)
CO2: 22 mEq/L (ref 19–32)
Calcium: 9 mg/dL (ref 8.4–10.5)
Chloride: 107 mEq/L (ref 96–112)
Creatinine, Ser: 0.92 mg/dL (ref 0.50–1.10)
Glucose, Bld: 100 mg/dL — ABNORMAL HIGH (ref 70–99)
Potassium: 3.8 mEq/L (ref 3.5–5.3)
Sodium: 139 mEq/L (ref 135–145)

## 2012-01-26 LAB — CBC WITH DIFFERENTIAL/PLATELET
BASO%: 0.4 % (ref 0.0–2.0)
Basophils Absolute: 0 10*3/uL (ref 0.0–0.1)
EOS%: 1.8 % (ref 0.0–7.0)
Eosinophils Absolute: 0.1 10*3/uL (ref 0.0–0.5)
HCT: 30.7 % — ABNORMAL LOW (ref 34.8–46.6)
HGB: 10 g/dL — ABNORMAL LOW (ref 11.6–15.9)
LYMPH%: 32.9 % (ref 14.0–49.7)
MCH: 24 pg — ABNORMAL LOW (ref 25.1–34.0)
MCHC: 32.6 g/dL (ref 31.5–36.0)
MCV: 73.6 fL — ABNORMAL LOW (ref 79.5–101.0)
MONO#: 0.4 10*3/uL (ref 0.1–0.9)
MONO%: 6.7 % (ref 0.0–14.0)
NEUT#: 3.3 10*3/uL (ref 1.5–6.5)
NEUT%: 58.2 % (ref 38.4–76.8)
Platelets: 177 10*3/uL (ref 145–400)
RBC: 4.17 10*6/uL (ref 3.70–5.45)
RDW: 18.6 % — ABNORMAL HIGH (ref 11.2–14.5)
WBC: 5.7 10*3/uL (ref 3.9–10.3)
lymph#: 1.9 10*3/uL (ref 0.9–3.3)
nRBC: 0 % (ref 0–0)

## 2012-01-26 LAB — PROTIME-INR
INR: 1.6 — ABNORMAL LOW (ref 2.00–3.50)
Protime: 19.2 Seconds — ABNORMAL HIGH (ref 10.6–13.4)

## 2012-01-27 ENCOUNTER — Other Ambulatory Visit: Payer: Self-pay | Admitting: *Deleted

## 2012-01-27 ENCOUNTER — Telehealth: Payer: Self-pay | Admitting: *Deleted

## 2012-01-27 DIAGNOSIS — C189 Malignant neoplasm of colon, unspecified: Secondary | ICD-10-CM

## 2012-01-27 NOTE — Telephone Encounter (Signed)
Made patient aware that MD orders to stay on same Coumadin dose and will recheck in 2 weeks. POF to scheduler. Patient asking if MD would allow her to increase her dose of Neurontin? Still having a lot of pain/discomfort in her feet on current dose of 300 mg bid.

## 2012-01-27 NOTE — Telephone Encounter (Signed)
Error

## 2012-01-28 ENCOUNTER — Telehealth: Payer: Self-pay | Admitting: *Deleted

## 2012-01-28 ENCOUNTER — Telehealth: Payer: Self-pay | Admitting: Oncology

## 2012-01-28 MED ORDER — GABAPENTIN 300 MG PO CAPS: 300.0000 mg | ORAL_CAPSULE | Freq: Three times a day (TID) | ORAL | Status: DC

## 2012-01-28 NOTE — Telephone Encounter (Signed)
pt rtn call and confirmed appt for 04/02

## 2012-01-28 NOTE — Telephone Encounter (Signed)
Called pt re: Neurontin dose. Per Dr. Truett Perna: Increase to 300 mg three times daily. She verbalized understanding.

## 2012-01-28 NOTE — Telephone Encounter (Signed)
called pt Mary [name] for lab appt on 4-02. Asked pt to rtn call to  confirm appt

## 2012-02-04 ENCOUNTER — Telehealth: Payer: Self-pay | Admitting: *Deleted

## 2012-02-04 ENCOUNTER — Emergency Department (HOSPITAL_COMMUNITY)
Admission: EM | Admit: 2012-02-04 | Discharge: 2012-02-04 | Disposition: A | Discharge status: Home / Self Care | Payer: BC Managed Care – PPO | Attending: Emergency Medicine | Admitting: Emergency Medicine

## 2012-02-04 ENCOUNTER — Encounter (HOSPITAL_COMMUNITY): Payer: Self-pay | Admitting: *Deleted

## 2012-02-04 ENCOUNTER — Other Ambulatory Visit: Payer: Self-pay

## 2012-02-04 ENCOUNTER — Emergency Department (HOSPITAL_COMMUNITY): Payer: BC Managed Care – PPO

## 2012-02-04 DIAGNOSIS — R0789 Other chest pain: Secondary | ICD-10-CM | POA: Insufficient documentation

## 2012-02-04 DIAGNOSIS — Z86718 Personal history of other venous thrombosis and embolism: Secondary | ICD-10-CM | POA: Insufficient documentation

## 2012-02-04 DIAGNOSIS — Z7901 Long term (current) use of anticoagulants: Secondary | ICD-10-CM | POA: Insufficient documentation

## 2012-02-04 DIAGNOSIS — Z85038 Personal history of other malignant neoplasm of large intestine: Secondary | ICD-10-CM | POA: Insufficient documentation

## 2012-02-04 DIAGNOSIS — Z79899 Other long term (current) drug therapy: Secondary | ICD-10-CM | POA: Insufficient documentation

## 2012-02-04 LAB — BASIC METABOLIC PANEL
BUN: 13 mg/dL (ref 6–23)
CO2: 27 mEq/L (ref 19–32)
Calcium: 9.4 mg/dL (ref 8.4–10.5)
Chloride: 107 mEq/L (ref 96–112)
Creatinine, Ser: 0.94 mg/dL (ref 0.50–1.10)
GFR calc Af Amer: 81 mL/min — ABNORMAL LOW (ref >90)
GFR calc non Af Amer: 70 mL/min — ABNORMAL LOW (ref >90)
Glucose, Bld: 84 mg/dL (ref 70–99)
Potassium: 4.1 mEq/L (ref 3.5–5.1)
Sodium: 140 mEq/L (ref 135–145)

## 2012-02-04 LAB — CBC
HCT: 31.8 % — ABNORMAL LOW (ref 36.0–46.0)
Hemoglobin: 10.3 g/dL — ABNORMAL LOW (ref 12.0–15.0)
MCH: 24.5 pg — ABNORMAL LOW (ref 26.0–34.0)
MCHC: 32.4 g/dL (ref 30.0–36.0)
MCV: 75.5 fL — ABNORMAL LOW (ref 78.0–100.0)
Platelets: 177 10*3/uL (ref 150–400)
RBC: 4.21 MIL/uL (ref 3.87–5.11)
RDW: 17.8 % — ABNORMAL HIGH (ref 11.5–15.5)
WBC: 4.8 10*3/uL (ref 4.0–10.5)

## 2012-02-04 LAB — POCT I-STAT TROPONIN I: Troponin i, poc: 0 ng/mL (ref 0.00–0.08)

## 2012-02-04 LAB — PROTIME-INR
INR: 1.22 (ref 0.00–1.49)
Prothrombin Time: 15.7 seconds — ABNORMAL HIGH (ref 11.6–15.2)

## 2012-02-04 MED ORDER — DIAZEPAM 5 MG PO TABS: 5.0000 mg | ORAL_TABLET | Freq: Two times a day (BID) | ORAL | Status: AC

## 2012-02-04 MED ORDER — IOHEXOL 350 MG/ML SOLN
100.0000 mL | Freq: Once | INTRAVENOUS | Status: AC | PRN
  Administered 2012-02-04: 100 mL via INTRAVENOUS

## 2012-02-04 MED ORDER — ASPIRIN 325 MG PO TABS
325.0000 mg | ORAL_TABLET | ORAL | Status: AC
  Administered 2012-02-04: 325 mg via ORAL
  Filled 2012-02-04: qty 1

## 2012-02-04 MED ORDER — HYDROCODONE-ACETAMINOPHEN 5-325 MG PO TABS: 2.0000 | ORAL_TABLET | ORAL | Status: AC | PRN

## 2012-02-04 MED ORDER — TECHNETIUM TO 99M ALBUMIN AGGREGATED
3.0000 | Freq: Once | INTRAVENOUS | Status: AC | PRN
  Administered 2012-02-04: 3 via INTRAVENOUS

## 2012-02-04 MED ORDER — HYDROCODONE-ACETAMINOPHEN 5-325 MG PO TABS
2.0000 | ORAL_TABLET | Freq: Once | ORAL | Status: AC
  Administered 2012-02-04: 2 via ORAL
  Filled 2012-02-04: qty 2

## 2012-02-04 NOTE — ED Notes (Signed)
Pt c/o sharp sudden cp that started last night. Pt states she was sent to ed for eval of possible 'clot'. Pt informed of the  importance to stay in bed. Verbalized understanding.

## 2012-02-04 NOTE — ED Notes (Signed)
Patient reports she is having chest pain.  She has hx of colon cancer,  Completed chemo in Feb.  Patient reports onset of chest pain last night.  She states she is having diff breathing as well.  Patient points to mid sternal area and states the pain goes thru to her back.  Her cancer doctor was concerned for blood clot

## 2012-02-04 NOTE — ED Notes (Signed)
repaged IV team due to need for IV access

## 2012-02-04 NOTE — Telephone Encounter (Signed)
Late entry: Pt called to report chest tightness on inspiration. Suggested she report to ED for evaluation. Pt asked that I check with Dr. Truett Perna to see if he would order an XRay for when she gets off work today.  Reviewed with Dr. Truett Perna: send pt to ED. Same done, pt verbalized understanding.

## 2012-02-04 NOTE — Discharge Instructions (Signed)
Chest Pain (Nonspecific) It is often hard to give a specific diagnosis for the cause of chest pain. There is always a chance that your pain could be related to something serious, such as a heart attack or a blood clot in the lungs. You need to follow up with your caregiver for further evaluation. CAUSES   Heartburn.   Pneumonia or bronchitis.   Anxiety or stress.   Inflammation around your heart (pericarditis) or lung (pleuritis or pleurisy).   A blood clot in the lung.   A collapsed lung (pneumothorax). It can develop suddenly on its own (spontaneous pneumothorax) or from injury (trauma) to the chest.   Shingles infection (herpes zoster virus).  The chest wall is composed of bones, muscles, and cartilage. Any of these can be the source of the pain.  The bones can be bruised by injury.   The muscles or cartilage can be strained by coughing or overwork.   The cartilage can be affected by inflammation and become sore (costochondritis).  DIAGNOSIS  Lab tests or other studies, such as X-rays, electrocardiography, stress testing, or cardiac imaging, may be needed to find the cause of your pain.  TREATMENT   Treatment depends on what may be causing your chest pain. Treatment may include:   Acid blockers for heartburn.   Anti-inflammatory medicine.   Pain medicine for inflammatory conditions.   Antibiotics if an infection is present.   You may be advised to change lifestyle habits. This includes stopping smoking and avoiding alcohol, caffeine, and chocolate.   You may be advised to keep your head raised (elevated) when sleeping. This reduces the chance of acid going backward from your stomach into your esophagus.   Most of the time, nonspecific chest pain will improve within 2 to 3 days with rest and mild pain medicine.  HOME CARE INSTRUCTIONS   If antibiotics were prescribed, take your antibiotics as directed. Finish them even if you start to feel better.   For the next few  days, avoid physical activities that bring on chest pain. Continue physical activities as directed.   Do not smoke.   Avoid drinking alcohol.   Only take over-the-counter or prescription medicine for pain, discomfort, or fever as directed by your caregiver.   Follow your caregiver's suggestions for further testing if your chest pain does not go away.   Keep any follow-up appointments you made. If you do not go to an appointment, you could develop lasting (chronic) problems with pain. If there is any problem keeping an appointment, you must call to reschedule.  SEEK MEDICAL CARE IF:   You think you are having problems from the medicine you are taking. Read your medicine instructions carefully.   Your chest pain does not go away, even after treatment.   You develop a rash with blisters on your chest.  SEEK IMMEDIATE MEDICAL CARE IF:   You have increased chest pain or pain that spreads to your arm, neck, jaw, back, or abdomen.   You develop shortness of breath, an increasing cough, or you are coughing up blood.   You have severe back or abdominal pain, feel nauseous, or vomit.   You develop severe weakness, fainting, or chills.   You have a fever.  THIS IS AN EMERGENCY. Do not wait to see if the pain will go away. Get medical help at once. Call your local emergency services (911 in [place] Do not drive yourself to the hospital. MAKE SURE YOU:   Understand these instructions.     Will watch your condition.   Will get help right away if you are not doing well or get worse.  Document Released: 08/06/2005 Document Revised: 10/16/2011 Document Reviewed: 06/01/2008 [employer] Patient Information 2012 [employer]. 

## 2012-02-04 NOTE — ED Provider Notes (Signed)
Pt placed in CDU from stretcher. Pt seen by Dr. Freida Busman, pt received CT angio chest to r/o PE but results were inconclusive. VQ scan recommended by radiologist. Scan ordered and per radiologist results show no PE. Pt sent home with pain medication (Vicodin) and muscle relaxers (valium). Pt is to follow-up with PCP regarding ER visit. I have discussed reuslts with family and answered all questions, they are comfortable with plan.  Dorthula Matas, PA 02/04/12 2015  I personally have spoken with radiology. We are unsure what the VQ scan results are not pulling threw but patient has been here 10 hours and is ready to go,. VQ scan normal per radiologist.  Dorthula Matas, PA 02/04/12 2029

## 2012-02-04 NOTE — ED Provider Notes (Signed)
History     CSN: [id number]  Arrival date & time 02/04/12  1127   First MD Initiated Contact with Patient 02/04/12 1419      Chief Complaint  Patient presents with  . Chest Pain    (Consider location/radiation/quality/duration/timing/severity/associated sxs/prior treatment) Patient is a 51 y.o. female presenting with chest pain. The history is provided by the patient.  Chest Pain    patient here with right-sided pleuritic chest pain x24 hours. Patient denies any fever or chills. Patient does have history of colon cancer and his chemotherapy. She also has a history of upper extremity DVT. She is on Coumadin for this. Denies any severe dyspnea. Pain has been nonexertional. Nothing makes her symptoms better  Past Medical History  Diagnosis Date  . Blood transfusion   . History of migraine headaches   . iron deficiency anemia   . Adenocarcinoma sigmoid colon 05/12/11    Stage III(pT2pN2b) descending sigmoid  . Multiple hemangiomas     Liver  . Renal cysts, acquired, bilateral   . Pneumothorax 07/19/11    Bilateral  . Arm DVT (deep venous thromboembolism), acute 07/04/11    r/t PAC  . Neuropathy due to drug 2012    Early--assoc. w/prolonged cold sensitivity--Oxaliplatin related    Past Surgical History  Procedure Date  . Abdominal hysterectomy   . Colon surgery 05/17/11    Partial colectomy  . Portacath placement 07/20/11    Right side placed  . Portacath placement     left side & right side    No family history on file.  History  Substance Use Topics  . Smoking status: Never Smoker   . Smokeless tobacco: Not on file  . Alcohol Use: No    OB History    Grav Para Term Preterm Abortions TAB SAB Ect Mult Living                  Review of Systems  Cardiovascular: Positive for chest pain.  All other systems reviewed and are negative.    Allergies  Review of patient's allergies indicates no known allergies.  Home Medications   Current Outpatient Rx  Name  Route Sig Dispense Refill  . EXCEDRIN MIGRAINE PO Oral Take 1 tablet by mouth daily as needed. For migraines.    Marland Kitchen GABAPENTIN 300 MG PO CAPS Oral Take 1 capsule (300 mg total) by mouth 3 (three) times daily. 90 capsule 2  . IRON PO Oral Take 325 mg by mouth 2 (two) times daily.     . WARFARIN SODIUM 5 MG PO TABS Oral Take 2.5-5 mg by mouth daily. Take 0.5 tablets (2.5 MG) every day except Monday, Wednesday, and Friday. Take 1 tablet (5 MG) on Mon, Wed, & Fri    . BISACODYL 5 MG PO TBEC Oral Take 5 mg by mouth daily as needed. For constipation.    Marland Kitchen LIDOCAINE-PRILOCAINE 2.5-2.5 % EX CREA Topical Apply 1 application topically as needed. Apply to [facility] site prn 30 g prn    BP 162/90  Pulse 82  Temp(Src) 97.6 F (36.4 C) (Oral)  Resp 18  Ht 5\' 9"  (1.753 m)  Wt 230 lb (104.327 kg)  BMI 33.96 kg/m2  SpO2 99%  Physical Exam  Nursing note and vitals reviewed. Constitutional: She is oriented to person, place, and time. She appears well-developed and well-nourished.  Non-toxic appearance. No distress.  HENT:  Head: Normocephalic and atraumatic.  Eyes: Conjunctivae, EOM and lids are normal. Pupils are equal, round, and reactive  to light.  Neck: Normal range of motion. Neck supple. No tracheal deviation present. No mass present.  Cardiovascular: Normal rate, regular rhythm and normal heart sounds.  Exam reveals no gallop.   No murmur heard. Pulmonary/Chest: Effort normal and breath sounds normal. No stridor. No respiratory distress. She has no decreased breath sounds. She has no wheezes. She has no rhonchi. She has no rales.  Abdominal: Soft. Normal appearance and bowel sounds are normal. She exhibits no distension. There is no tenderness. There is no rebound and no CVA tenderness.  Musculoskeletal: Normal range of motion. She exhibits no edema and no tenderness.  Neurological: She is alert and oriented to person, place, and time. She has normal strength. No cranial nerve deficit or sensory deficit.  GCS eye subscore is 4. GCS verbal subscore is 5. GCS motor subscore is 6.  Skin: Skin is warm and dry. No abrasion and no rash noted.  Psychiatric: She has a normal mood and affect. Her speech is normal and behavior is normal.    ED Course  Procedures (including critical care time)  Labs Reviewed  CBC - Abnormal; Notable for the following:    Hemoglobin 10.3 (*)    HCT 31.8 (*)    MCV 75.5 (*)    MCH 24.5 (*)    RDW 17.8 (*)    All other components within normal limits  BASIC METABOLIC PANEL - Abnormal; Notable for the following:    GFR calc non Af Amer 70 (*)    GFR calc Af Amer 81 (*)    All other components within normal limits  PROTIME-INR - Abnormal; Notable for the following:    Prothrombin Time 15.7 (*)    All other components within normal limits   Dg Chest 2 View  02/04/2012  *RADIOLOGY REPORT*  Clinical Data: Chest pain, shortness of breath, colon cancer  CHEST - 2 VIEW  Comparison: 01/18/2012  Findings: Cardiomediastinal silhouette is stable.  Stable right subclavian Port-A-Cath position.  No acute infiltrate or pleural effusion.  No pulmonary edema. Mild degenerative changes mid thoracic spine.  IMPRESSION: No active disease.  No significant change.  Original Report Authenticated By: Natasha Mead, M.D.     No diagnosis found.    MDM  Pt had chest ct which was nondiagnostic for pe--subsequent V/Q scan neg, no concern for acs, pt discharged from [facility]        Toy Baker, MD 02/05/12 1814

## 2012-02-04 NOTE — ED Notes (Signed)
CT made aware of IV access now established

## 2012-02-04 NOTE — ED Notes (Signed)
Patient daughter has requested bedpan . Pt assisted onto bedpan and daughter verbalizes that she thinks it is inappropriate for a female cna to assist a female patient. Pt daughter also found going thru cabinets and drawers in room getting socks, linens, chux etc. Asked pt daughter to allow staff to get supplies for her due to infection control concerns. Daughter seemed upset at request and began a list of wants that she wants for her mother. The patient has not asked for anything but the bedpan. Ed Interior and spatial designer aware of situation. Pt also being placed on cdu stretcher for more comfort.

## 2012-02-05 NOTE — ED Provider Notes (Signed)
Medical screening examination/treatment/procedure(s) were conducted as a shared visit with non-physician practitioner(s) and myself.  I personally evaluated the patient during the encounter  Toy Baker, MD 02/05/12 1806

## 2012-02-10 ENCOUNTER — Other Ambulatory Visit: Payer: BC Managed Care – PPO | Admitting: Lab

## 2012-02-10 ENCOUNTER — Telehealth: Payer: Self-pay | Admitting: Oncology

## 2012-02-10 NOTE — Telephone Encounter (Signed)
Gv pt appt for [phone]

## 2012-02-12 ENCOUNTER — Encounter: Payer: Self-pay | Admitting: *Deleted

## 2012-02-12 ENCOUNTER — Telehealth: Payer: Self-pay | Admitting: *Deleted

## 2012-02-12 ENCOUNTER — Telehealth: Payer: Self-pay | Admitting: Oncology

## 2012-02-12 ENCOUNTER — Other Ambulatory Visit (HOSPITAL_BASED_OUTPATIENT_CLINIC_OR_DEPARTMENT_OTHER): Payer: BC Managed Care – PPO | Admitting: Lab

## 2012-02-12 DIAGNOSIS — I82629 Acute embolism and thrombosis of deep veins of unspecified upper extremity: Secondary | ICD-10-CM

## 2012-02-12 DIAGNOSIS — C189 Malignant neoplasm of colon, unspecified: Secondary | ICD-10-CM

## 2012-02-12 LAB — PROTIME-INR
INR: 1.2 — ABNORMAL LOW (ref 2.00–3.50)
Protime: 14.4 Seconds — ABNORMAL HIGH (ref 10.6–13.4)

## 2012-02-12 NOTE — Telephone Encounter (Signed)
Instructed patient to increase Coumadin to 5 mg daily and recheck on Monday. POF to scheduler.

## 2012-02-12 NOTE — Telephone Encounter (Signed)
called pt to scheduled lab appt for 04/08 and pt stated that she will rtn call to schedule

## 2012-02-13 ENCOUNTER — Telehealth: Payer: Self-pay | Admitting: Oncology

## 2012-02-13 NOTE — Telephone Encounter (Signed)
called pt lmovm for lab appt on 04/08

## 2012-02-16 ENCOUNTER — Other Ambulatory Visit: Payer: BC Managed Care – PPO | Admitting: Lab

## 2012-02-16 ENCOUNTER — Other Ambulatory Visit (HOSPITAL_BASED_OUTPATIENT_CLINIC_OR_DEPARTMENT_OTHER): Payer: BC Managed Care – PPO | Admitting: Lab

## 2012-02-16 DIAGNOSIS — I82629 Acute embolism and thrombosis of deep veins of unspecified upper extremity: Secondary | ICD-10-CM

## 2012-02-16 LAB — PROTIME-INR
INR: 1.3 — ABNORMAL LOW (ref 2.00–3.50)
Protime: 15.6 Seconds — ABNORMAL HIGH (ref 10.6–13.4)

## 2012-02-18 ENCOUNTER — Telehealth: Payer: Self-pay | Admitting: *Deleted

## 2012-02-18 DIAGNOSIS — I82629 Acute embolism and thrombosis of deep veins of unspecified upper extremity: Secondary | ICD-10-CM

## 2012-02-18 NOTE — Telephone Encounter (Signed)
Left message on voicemail for pt to call office. Continue same dose of Coumadin- check PT INR in one week.

## 2012-02-19 ENCOUNTER — Telehealth: Payer: Self-pay | Admitting: Oncology

## 2012-02-19 NOTE — Telephone Encounter (Signed)
called pt and scheduled lab for 04/15

## 2012-02-23 ENCOUNTER — Other Ambulatory Visit (HOSPITAL_BASED_OUTPATIENT_CLINIC_OR_DEPARTMENT_OTHER): Payer: BC Managed Care – PPO | Admitting: Lab

## 2012-02-23 DIAGNOSIS — I82629 Acute embolism and thrombosis of deep veins of unspecified upper extremity: Secondary | ICD-10-CM

## 2012-02-23 LAB — PROTIME-INR
INR: 1.7 — ABNORMAL LOW (ref 2.00–3.50)
Protime: 20.4 Seconds — ABNORMAL HIGH (ref 10.6–13.4)

## 2012-02-24 ENCOUNTER — Telehealth: Payer: Self-pay | Admitting: *Deleted

## 2012-02-24 NOTE — Telephone Encounter (Signed)
Patient notified of coumadin dose change per Dr. Truett Perna

## 2012-03-04 ENCOUNTER — Ambulatory Visit (HOSPITAL_BASED_OUTPATIENT_CLINIC_OR_DEPARTMENT_OTHER): Payer: BC Managed Care – PPO

## 2012-03-04 VITALS — BP 130/83 | HR 80 | Temp 98.2°F

## 2012-03-04 DIAGNOSIS — Z452 Encounter for adjustment and management of vascular access device: Secondary | ICD-10-CM

## 2012-03-04 DIAGNOSIS — C186 Malignant neoplasm of descending colon: Secondary | ICD-10-CM

## 2012-03-04 DIAGNOSIS — C189 Malignant neoplasm of colon, unspecified: Secondary | ICD-10-CM

## 2012-03-04 MED ORDER — HEPARIN SOD (PORK) LOCK FLUSH 100 UNIT/ML IV SOLN
500.0000 [IU] | Freq: Once | INTRAVENOUS | Status: AC
  Administered 2012-03-04: 500 [IU] via INTRAVENOUS
  Filled 2012-03-04: qty 5

## 2012-03-04 MED ORDER — SODIUM CHLORIDE 0.9 % IJ SOLN
10.0000 mL | INTRAMUSCULAR | Status: DC | PRN
  Administered 2012-03-04: 10 mL via INTRAVENOUS
  Filled 2012-03-04: qty 10

## 2012-03-08 ENCOUNTER — Ambulatory Visit: Payer: BC Managed Care – PPO | Admitting: Oncology

## 2012-03-08 ENCOUNTER — Other Ambulatory Visit: Payer: BC Managed Care – PPO | Admitting: Lab

## 2012-03-10 ENCOUNTER — Other Ambulatory Visit: Payer: Self-pay | Admitting: Oncology

## 2012-03-10 ENCOUNTER — Telehealth: Payer: Self-pay | Admitting: *Deleted

## 2012-03-10 DIAGNOSIS — C189 Malignant neoplasm of colon, unspecified: Secondary | ICD-10-CM

## 2012-03-10 DIAGNOSIS — I82622 Acute embolism and thrombosis of deep veins of left upper extremity: Secondary | ICD-10-CM

## 2012-03-10 NOTE — Telephone Encounter (Signed)
Call from pt reporting her surgical incision "puffs out" when she coughs. Denies pain. Pt wonders if this is a hernia. Asking if she should be seen sooner than 5/9 appt. Suggested she call her surgeon, they may want to evaluate area. She agrees to do so, instructed pt to keep scheduled appt with Dr. Truett Perna.

## 2012-03-18 ENCOUNTER — Telehealth: Payer: Self-pay | Admitting: Oncology

## 2012-03-18 ENCOUNTER — Ambulatory Visit (HOSPITAL_BASED_OUTPATIENT_CLINIC_OR_DEPARTMENT_OTHER): Payer: BC Managed Care – PPO | Admitting: Oncology

## 2012-03-18 ENCOUNTER — Telehealth: Payer: Self-pay | Admitting: *Deleted

## 2012-03-18 ENCOUNTER — Encounter (INDEPENDENT_AMBULATORY_CARE_PROVIDER_SITE_OTHER): Payer: BC Managed Care – PPO | Admitting: General Surgery

## 2012-03-18 ENCOUNTER — Other Ambulatory Visit (HOSPITAL_BASED_OUTPATIENT_CLINIC_OR_DEPARTMENT_OTHER): Payer: BC Managed Care – PPO | Admitting: Lab

## 2012-03-18 VITALS — BP 151/89 | HR 77 | Temp 98.9°F | Ht 69.0 in | Wt 232.5 lb

## 2012-03-18 DIAGNOSIS — C189 Malignant neoplasm of colon, unspecified: Secondary | ICD-10-CM

## 2012-03-18 DIAGNOSIS — D509 Iron deficiency anemia, unspecified: Secondary | ICD-10-CM

## 2012-03-18 DIAGNOSIS — K7689 Other specified diseases of liver: Secondary | ICD-10-CM

## 2012-03-18 DIAGNOSIS — I82629 Acute embolism and thrombosis of deep veins of unspecified upper extremity: Secondary | ICD-10-CM

## 2012-03-18 LAB — CBC WITH DIFFERENTIAL/PLATELET
BASO%: 0.8 % (ref 0.0–2.0)
Basophils Absolute: 0 10*3/uL (ref 0.0–0.1)
EOS%: 1.3 % (ref 0.0–7.0)
Eosinophils Absolute: 0.1 10*3/uL (ref 0.0–0.5)
HCT: 33.7 % — ABNORMAL LOW (ref 34.8–46.6)
HGB: 10.7 g/dL — ABNORMAL LOW (ref 11.6–15.9)
LYMPH%: 36 % (ref 14.0–49.7)
MCH: 23.7 pg — ABNORMAL LOW (ref 25.1–34.0)
MCHC: 31.6 g/dL (ref 31.5–36.0)
MCV: 75 fL — ABNORMAL LOW (ref 79.5–101.0)
MONO#: 0.5 10*3/uL (ref 0.1–0.9)
MONO%: 8.2 % (ref 0.0–14.0)
NEUT#: 3.1 10*3/uL (ref 1.5–6.5)
NEUT%: 53.7 % (ref 38.4–76.8)
Platelets: 179 10*3/uL (ref 145–400)
RBC: 4.49 10*6/uL (ref 3.70–5.45)
RDW: 16.5 % — ABNORMAL HIGH (ref 11.2–14.5)
WBC: 5.7 10*3/uL (ref 3.9–10.3)
lymph#: 2.1 10*3/uL (ref 0.9–3.3)
nRBC: 0 % (ref 0–0)

## 2012-03-18 LAB — COMPREHENSIVE METABOLIC PANEL
ALT: 14 U/L (ref 0–35)
AST: 16 U/L (ref 0–37)
Albumin: 4 g/dL (ref 3.5–5.2)
Alkaline Phosphatase: 91 U/L (ref 39–117)
BUN: 18 mg/dL (ref 6–23)
CO2: 23 mEq/L (ref 19–32)
Calcium: 9.3 mg/dL (ref 8.4–10.5)
Chloride: 104 mEq/L (ref 96–112)
Creatinine, Ser: 0.93 mg/dL (ref 0.50–1.10)
Glucose, Bld: 115 mg/dL — ABNORMAL HIGH (ref 70–99)
Potassium: 3.8 mEq/L (ref 3.5–5.3)
Sodium: 139 mEq/L (ref 135–145)
Total Bilirubin: 0.3 mg/dL (ref 0.3–1.2)
Total Protein: 6.9 g/dL (ref 6.0–8.3)

## 2012-03-18 LAB — PROTIME-INR
INR: 1.3 — ABNORMAL LOW (ref 2.00–3.50)
Protime: 15.6 Seconds — ABNORMAL HIGH (ref 10.6–13.4)

## 2012-03-18 LAB — CEA: CEA: 0.9 ng/mL (ref 0.0–5.0)

## 2012-03-18 NOTE — Telephone Encounter (Signed)
Pt missed a few doses of Coumadin while waiting for refill. Resumed on 03/16/12. Was instructed by Dr. Truett Perna during visit today to continue same dose and check PT INR on 04/08/12.
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
## 2012-03-18 NOTE — Telephone Encounter (Signed)
appts made and printed for pt aom °

## 2012-04-08 ENCOUNTER — Other Ambulatory Visit: Payer: BC Managed Care – PPO | Admitting: Lab

## 2012-04-14 ENCOUNTER — Other Ambulatory Visit: Payer: Self-pay | Admitting: *Deleted

## 2012-04-14 ENCOUNTER — Other Ambulatory Visit (HOSPITAL_BASED_OUTPATIENT_CLINIC_OR_DEPARTMENT_OTHER): Payer: BC Managed Care – PPO | Admitting: Lab

## 2012-04-14 ENCOUNTER — Ambulatory Visit (HOSPITAL_BASED_OUTPATIENT_CLINIC_OR_DEPARTMENT_OTHER): Payer: BC Managed Care – PPO

## 2012-04-14 VITALS — BP 150/50 | HR 83 | Temp 98.2°F

## 2012-04-14 DIAGNOSIS — I82629 Acute embolism and thrombosis of deep veins of unspecified upper extremity: Secondary | ICD-10-CM

## 2012-04-14 DIAGNOSIS — C189 Malignant neoplasm of colon, unspecified: Secondary | ICD-10-CM

## 2012-04-14 DIAGNOSIS — Z452 Encounter for adjustment and management of vascular access device: Secondary | ICD-10-CM

## 2012-04-14 LAB — PROTIME-INR
INR: 1.5 — ABNORMAL LOW (ref 2.00–3.50)
Protime: 18 Seconds — ABNORMAL HIGH (ref 10.6–13.4)

## 2012-04-14 MED ORDER — HEPARIN SOD (PORK) LOCK FLUSH 100 UNIT/ML IV SOLN
500.0000 [IU] | Freq: Once | INTRAVENOUS | Status: AC
  Administered 2012-04-14: 500 [IU] via INTRAVENOUS
  Filled 2012-04-14: qty 5

## 2012-04-14 MED ORDER — SODIUM CHLORIDE 0.9 % IJ SOLN
10.0000 mL | INTRAMUSCULAR | Status: DC | PRN
  Administered 2012-04-14: 10 mL via INTRAVENOUS
  Filled 2012-04-14: qty 10

## 2012-04-15 ENCOUNTER — Telehealth: Payer: Self-pay | Admitting: Oncology

## 2012-04-15 ENCOUNTER — Telehealth: Payer: Self-pay | Admitting: *Deleted

## 2012-04-15 DIAGNOSIS — I82629 Acute embolism and thrombosis of deep veins of unspecified upper extremity: Secondary | ICD-10-CM

## 2012-04-15 NOTE — Telephone Encounter (Signed)
Left VM with new Coumadin directions and requested she call back to confirm she understands new directions.

## 2012-04-15 NOTE — Telephone Encounter (Signed)
called pt lmovm that a lab was scheduled with her ov 06/13

## 2012-04-16 ENCOUNTER — Telehealth: Payer: Self-pay | Admitting: *Deleted

## 2012-04-16 NOTE — Telephone Encounter (Signed)
Left VM on home machine to increase coumadin to 7.5mg  daily except 5 mg MWF. Recheck on 6/13. Requested she call to confirm. Attempted to reach her at work #--she was not there at this time. Husband's phone # did not identify who he was, so message was not left.

## 2012-04-19 ENCOUNTER — Ambulatory Visit (HOSPITAL_COMMUNITY)
Admission: RE | Admit: 2012-04-19 | Discharge: 2012-04-19 | Disposition: A | Discharge status: Home / Self Care | Payer: BC Managed Care – PPO | Source: Ambulatory Visit | Attending: Oncology | Admitting: Oncology

## 2012-04-19 DIAGNOSIS — C189 Malignant neoplasm of colon, unspecified: Secondary | ICD-10-CM | POA: Insufficient documentation

## 2012-04-19 DIAGNOSIS — K439 Ventral hernia without obstruction or gangrene: Secondary | ICD-10-CM | POA: Insufficient documentation

## 2012-04-19 DIAGNOSIS — K7689 Other specified diseases of liver: Secondary | ICD-10-CM | POA: Insufficient documentation

## 2012-04-19 MED ORDER — IOHEXOL 300 MG/ML  SOLN
100.0000 mL | Freq: Once | INTRAMUSCULAR | Status: AC | PRN
  Administered 2012-04-19: 100 mL via INTRAVENOUS

## 2012-04-20 ENCOUNTER — Telehealth: Payer: Self-pay | Admitting: *Deleted

## 2012-04-20 NOTE — Telephone Encounter (Signed)
Confirmed with patient she did receive direction change in coumadin last week--7.5 mg daily and 5 mg MWF

## 2012-04-22 ENCOUNTER — Telehealth: Payer: Self-pay | Admitting: Oncology

## 2012-04-22 ENCOUNTER — Other Ambulatory Visit (HOSPITAL_BASED_OUTPATIENT_CLINIC_OR_DEPARTMENT_OTHER): Payer: BC Managed Care – PPO | Admitting: Lab

## 2012-04-22 ENCOUNTER — Ambulatory Visit (HOSPITAL_BASED_OUTPATIENT_CLINIC_OR_DEPARTMENT_OTHER): Payer: BC Managed Care – PPO | Admitting: Oncology

## 2012-04-22 VITALS — BP 131/79 | HR 72 | Temp 98.9°F | Ht 69.0 in | Wt 231.9 lb

## 2012-04-22 DIAGNOSIS — I82629 Acute embolism and thrombosis of deep veins of unspecified upper extremity: Secondary | ICD-10-CM

## 2012-04-22 DIAGNOSIS — C186 Malignant neoplasm of descending colon: Secondary | ICD-10-CM

## 2012-04-22 DIAGNOSIS — D649 Anemia, unspecified: Secondary | ICD-10-CM

## 2012-04-22 DIAGNOSIS — C189 Malignant neoplasm of colon, unspecified: Secondary | ICD-10-CM

## 2012-04-22 LAB — PROTIME-INR
INR: 2.2 (ref 2.00–3.50)
Protime: 26.4 Seconds — ABNORMAL HIGH (ref 10.6–13.4)

## 2012-04-22 NOTE — Telephone Encounter (Signed)
appts made and printed,l/m for surg sch at dr wyatt for port remov
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
## 2012-04-27 ENCOUNTER — Other Ambulatory Visit (INDEPENDENT_AMBULATORY_CARE_PROVIDER_SITE_OTHER): Payer: Self-pay | Admitting: General Surgery

## 2012-05-06 ENCOUNTER — Ambulatory Visit (HOSPITAL_BASED_OUTPATIENT_CLINIC_OR_DEPARTMENT_OTHER)
Admission: RE | Admit: 2012-05-06 | Discharge: 2012-05-06 | Disposition: A | Discharge status: Home / Self Care | Payer: BC Managed Care – PPO | Source: Ambulatory Visit | Attending: General Surgery | Admitting: General Surgery

## 2012-05-06 ENCOUNTER — Encounter (HOSPITAL_BASED_OUTPATIENT_CLINIC_OR_DEPARTMENT_OTHER): Payer: Self-pay | Admitting: *Deleted

## 2012-05-06 ENCOUNTER — Encounter (HOSPITAL_BASED_OUTPATIENT_CLINIC_OR_DEPARTMENT_OTHER)
Admission: RE | Disposition: A | Discharge status: Home / Self Care | Payer: Self-pay | Source: Ambulatory Visit | Attending: General Surgery

## 2012-05-06 DIAGNOSIS — C189 Malignant neoplasm of colon, unspecified: Secondary | ICD-10-CM

## 2012-05-06 DIAGNOSIS — Z452 Encounter for adjustment and management of vascular access device: Secondary | ICD-10-CM

## 2012-05-06 HISTORY — PX: PORT-A-CATH REMOVAL: SHX5289

## 2012-05-06 SURGERY — MINOR REMOVAL PORT-A-CATH
Anesthesia: LOCAL | Site: Chest | Laterality: Right | Wound class: Clean

## 2012-05-06 MED ORDER — HYDROCODONE-ACETAMINOPHEN 5-500 MG PO TABS: 1.0000 | ORAL_TABLET | Freq: Four times a day (QID) | ORAL | Status: AC | PRN

## 2012-05-06 MED ORDER — LIDOCAINE HCL (PF) 1 % IJ SOLN
INTRAMUSCULAR | Status: DC | PRN
  Administered 2012-05-06: 5 mL
  Administered 2012-05-06: 1 mL
  Administered 2012-05-06: 8 mL

## 2012-05-06 SURGICAL SUPPLY — 35 items
ADH SKN CLS APL DERMABOND .7 (GAUZE/BANDAGES/DRESSINGS) ×1
APL SKNCLS STERI-STRIP NONHPOA (GAUZE/BANDAGES/DRESSINGS)
BENZOIN TINCTURE PRP APPL 2/3 (GAUZE/BANDAGES/DRESSINGS) IMPLANT
BLADE SURG 15 STRL LF DISP TIS (BLADE) ×1 IMPLANT
BLADE SURG 15 STRL SS (BLADE) ×2
CAUTERY EYE LOW TEMP 1300F FIN (OPHTHALMIC RELATED) IMPLANT
CHLORAPREP W/TINT 26ML (MISCELLANEOUS) ×2 IMPLANT
CLOTH BEACON ORANGE TIMEOUT ST (SAFETY) ×2 IMPLANT
DERMABOND ADVANCED (GAUZE/BANDAGES/DRESSINGS) ×1
DERMABOND ADVANCED .7 DNX12 (GAUZE/BANDAGES/DRESSINGS) IMPLANT
DRAPE UTILITY XL STRL (DRAPES) IMPLANT
DRSG TEGADERM 2-3/8X2-3/4 SM (GAUZE/BANDAGES/DRESSINGS) ×2 IMPLANT
DRSG TEGADERM 4X4.75 (GAUZE/BANDAGES/DRESSINGS) IMPLANT
ELECT REM PT RETURN 9FT ADLT (ELECTROSURGICAL)
ELECTRODE REM PT RTRN 9FT ADLT (ELECTROSURGICAL) IMPLANT
GAUZE SPONGE 4X4 12PLY STRL LF (GAUZE/BANDAGES/DRESSINGS) IMPLANT
GLOVE BIO SURGEON STRL SZ 6.5 (GLOVE) ×1 IMPLANT
GLOVE BIO SURGEON STRL SZ7.5 (GLOVE) ×1 IMPLANT
GLOVE BIOGEL PI IND STRL 8 (GLOVE) ×1 IMPLANT
GLOVE BIOGEL PI INDICATOR 8 (GLOVE)
GLOVE ECLIPSE 7.5 STRL STRAW (GLOVE) ×2 IMPLANT
NDL HYPO 30X.5 LL (NEEDLE) ×1 IMPLANT
NDL SAFETY ECLIPSE 18X1.5 (NEEDLE) ×1 IMPLANT
NEEDLE HYPO 18GX1.5 SHARP (NEEDLE)
NEEDLE HYPO 30X.5 LL (NEEDLE) ×2 IMPLANT
PENCIL BUTTON HOLSTER BLD 10FT (ELECTRODE) IMPLANT
STRIP CLOSURE SKIN 1/2X4 (GAUZE/BANDAGES/DRESSINGS) ×1 IMPLANT
SUT MNCRL AB 4-0 PS2 18 (SUTURE) ×2 IMPLANT
SUT VIC AB 3-0 54X BRD REEL (SUTURE) IMPLANT
SUT VIC AB 3-0 BRD 54 (SUTURE)
SUT VIC AB 5-0 P-3 18X BRD (SUTURE) IMPLANT
SUT VIC AB 5-0 P3 18 (SUTURE)
SUT VICRYL 4-0 PS2 18IN ABS (SUTURE) IMPLANT
SYR CONTROL 10ML LL (SYRINGE) ×2 IMPLANT
TOWEL OR 17X24 6PK STRL BLUE (TOWEL DISPOSABLE) IMPLANT

## 2012-05-06 NOTE — Op Note (Signed)
OPERATIVE REPORT  DATE OF OPERATION: 05/06/2012  PATIENT:  Mary [name]  51 y.o. female  PRE-OPERATIVE DIAGNOSIS:  Functioning port-a-cath  POST-OPERATIVE DIAGNOSIS:  Same with scarring and tethering  PROCEDURE:  Procedure(s): MINOR REMOVAL PORT-A-CATH  SURGEON:  Surgeon(s): Cherylynn Ridges, MD  ASSISTANT: [place], PA-S  ANESTHESIA:   local, 1% Xylocaine without epinephrine  EBL: 50 ml  BLOOD ADMINISTERED: none  DRAINS: none   SPECIMEN:  No Specimen  COUNTS CORRECT:  YES  PROCEDURE DETAILS: The patient was taken to the operating room and placed on the table in supine position. As the primary local anesthetic procedure. A proper time out was performed identifying the patient and the procedure to be performed. We subsequently prepped and draped her right subclavian area.  We anesthetized the area at the port site using 1% Xylocaine without epinephrine. Using 15 blade to make a transverse incision at the site of the previous incision. We subsequently dissected out the catheter at the junction with the port in an attempt to remove it from the more proximal portion of the subclavian vein that was tethering at the subclavian entrance site requiring a counterincision at that site.  We anesthetized the subclavian site at the site with the catheter entered into the vein. We then dissected out the catheter that point and using traction at that point we were able to remove it from the distal vein. The catheter came out fully intact.  We subsequently were able to remove the port from its pocket. We removed the port and the catheter intact. We closed both sides using running subcuticular stitch of 4-0 Monocryl. There was some bleeding but the patient was on Coumadin and aspirin. All counts were correct. We closed with Dermabond Steri-Strips and Tegaderm also.  PATIENT DISPOSITION:  Discharged from the OR directly without sedation.  Minimal bleeding.   Cherylynn Ridges 6/27/20132:39  PM

## 2012-05-06 NOTE — Interval H&P Note (Signed)
History and Physical Interval Note:  05/06/2012 5:46 PM  [name]  has presented today for surgery, with the diagnosis of Functioning port-a-cath  The various methods of treatment have been discussed with the patient and family. After consideration of risks, benefits and other options for treatment, the patient has consented to  Procedure(s) (LRB): MINOR REMOVAL PORT-A-CATH (Right) as a surgical intervention .  The patient's history has been reviewed, patient examined, no change in status, stable for surgery.  I have reviewed the patients' chart and labs.  Questions were answered to the patient's satisfaction.     Mary [name] O  Patient has a functional Port-a-Cath that needs to be removed.
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
## 2012-05-10 ENCOUNTER — Encounter (HOSPITAL_BASED_OUTPATIENT_CLINIC_OR_DEPARTMENT_OTHER): Payer: Self-pay | Admitting: General Surgery

## 2012-05-12 ENCOUNTER — Other Ambulatory Visit: Payer: Self-pay | Admitting: *Deleted

## 2012-05-18 ENCOUNTER — Telehealth: Payer: Self-pay | Admitting: *Deleted

## 2012-05-18 NOTE — Telephone Encounter (Signed)
Asking when she can stop her coumadin since PAC was removed last week? Per Dr. Truett Perna, may stop now.

## 2012-05-31 ENCOUNTER — Telehealth (INDEPENDENT_AMBULATORY_CARE_PROVIDER_SITE_OTHER): Payer: Self-pay | Admitting: General Surgery

## 2012-05-31 NOTE — Telephone Encounter (Signed)
Pt of Dr. Lindie Spruce calling for appt to see him to eval an incisional hernia for repair.  She states it is now larger; next available appt in late August.  Call her at [phone] please.

## 2012-05-31 NOTE — Telephone Encounter (Signed)
Called patient, no answer/ Left message on voicemail with appointment info of Aug 6 @ 10:20 and to call and reschedule if she couldn't make that time.

## 2012-06-01 ENCOUNTER — Ambulatory Visit (INDEPENDENT_AMBULATORY_CARE_PROVIDER_SITE_OTHER): Payer: BC Managed Care – PPO | Admitting: General Surgery

## 2012-06-15 ENCOUNTER — Encounter (INDEPENDENT_AMBULATORY_CARE_PROVIDER_SITE_OTHER): Payer: BC Managed Care – PPO | Admitting: General Surgery

## 2012-06-16 ENCOUNTER — Ambulatory Visit (INDEPENDENT_AMBULATORY_CARE_PROVIDER_SITE_OTHER): Payer: BC Managed Care – PPO | Admitting: General Surgery

## 2012-06-16 ENCOUNTER — Encounter (INDEPENDENT_AMBULATORY_CARE_PROVIDER_SITE_OTHER): Payer: Self-pay | Admitting: General Surgery

## 2012-06-16 VITALS — BP 138/84 | HR 72 | Temp 98.0°F | Resp 16 | Ht 69.0 in | Wt 225.6 lb

## 2012-06-16 DIAGNOSIS — K432 Incisional hernia without obstruction or gangrene: Secondary | ICD-10-CM | POA: Insufficient documentation

## 2012-06-16 NOTE — Progress Notes (Signed)
The patient comes in today with a 4.0cm defect at the superior aspect of the abdominal incision.  There isnot incarceration.  Minimally tender.  Otherwise doing well.  This occurred after significant lifting at work.  Will plan on operative laparoscopic repair.  ASAP.

## 2012-06-16 NOTE — Addendum Note (Signed)
Addended by: Frederik Schmidt on: 06/16/2012 02:35 PM   Modules accepted: Orders

## 2012-08-31 ENCOUNTER — Encounter (HOSPITAL_COMMUNITY): Payer: Self-pay | Admitting: Pharmacy Technician

## 2012-09-02 ENCOUNTER — Encounter (HOSPITAL_COMMUNITY): Payer: Self-pay

## 2012-09-02 ENCOUNTER — Encounter (HOSPITAL_COMMUNITY)
Admission: RE | Admit: 2012-09-02 | Discharge: 2012-09-02 | Disposition: A | Discharge status: Home / Self Care | Payer: BC Managed Care – PPO | Source: Ambulatory Visit | Attending: General Surgery | Admitting: General Surgery

## 2012-09-02 HISTORY — DX: Gastro-esophageal reflux disease without esophagitis: K21.9

## 2012-09-02 LAB — BASIC METABOLIC PANEL
BUN: 13 mg/dL (ref 6–23)
CO2: 28 mEq/L (ref 19–32)
Calcium: 9.3 mg/dL (ref 8.4–10.5)
Chloride: 101 mEq/L (ref 96–112)
Creatinine, Ser: 1.03 mg/dL (ref 0.50–1.10)
GFR calc Af Amer: 72 mL/min — ABNORMAL LOW (ref >90)
GFR calc non Af Amer: 62 mL/min — ABNORMAL LOW (ref >90)
Glucose, Bld: 100 mg/dL — ABNORMAL HIGH (ref 70–99)
Potassium: 3.6 mEq/L (ref 3.5–5.1)
Sodium: 136 mEq/L (ref 135–145)

## 2012-09-02 LAB — CBC WITH DIFFERENTIAL/PLATELET
Basophils Absolute: 0 10*3/uL (ref 0.0–0.1)
Basophils Relative: 0 % (ref 0–1)
Eosinophils Absolute: 0.1 10*3/uL (ref 0.0–0.7)
Eosinophils Relative: 1 % (ref 0–5)
HCT: 34.9 % — ABNORMAL LOW (ref 36.0–46.0)
Hemoglobin: 11.5 g/dL — ABNORMAL LOW (ref 12.0–15.0)
Lymphocytes Relative: 31 % (ref 12–46)
Lymphs Abs: 2.1 10*3/uL (ref 0.7–4.0)
MCH: 23.3 pg — ABNORMAL LOW (ref 26.0–34.0)
MCHC: 33 g/dL (ref 30.0–36.0)
MCV: 70.6 fL — ABNORMAL LOW (ref 78.0–100.0)
Monocytes Absolute: 0.5 10*3/uL (ref 0.1–1.0)
Monocytes Relative: 7 % (ref 3–12)
Neutro Abs: 4 10*3/uL (ref 1.7–7.7)
Neutrophils Relative %: 60 % (ref 43–77)
Platelets: 198 10*3/uL (ref 150–400)
RBC: 4.94 MIL/uL (ref 3.87–5.11)
RDW: 16.4 % — ABNORMAL HIGH (ref 11.5–15.5)
WBC: 6.6 10*3/uL (ref 4.0–10.5)

## 2012-09-02 LAB — PROTIME-INR
INR: 1.1 (ref 0.00–1.49)
Prothrombin Time: 14.1 seconds (ref 11.6–15.2)

## 2012-09-02 LAB — SURGICAL PCR SCREEN
MRSA, PCR: NEGATIVE
Staphylococcus aureus: NEGATIVE

## 2012-09-02 NOTE — Pre-Procedure Instructions (Signed)
20 Mary [name]  09/02/2012   Your procedure is scheduled on: Monday, October 28th.  Report to [facility] at 10:30 AM.  Call this number if you have problems the morning of surgery: [phone]   Remember:   Do not eat food or anything to drink :After Midnight.      Take these medicines the morning of surgery with A SIP OF WATER: None    Do not wear jewelry, make-up or nail polish.  Do not wear lotions, powders, or perfumes. You may wear deodorant.  Do not shave 48 hours prior to surgery. Men may shave face and neck.  Do not bring valuables to the hospital.  Contacts, dentures or bridgework may not be worn into surgery.  Leave suitcase in the car. After surgery it may be brought to your room.  For patients admitted to the hospital, checkout time is 11:00 AM the day of discharge.   Patients discharged the day of surgery will not be allowed to drive home.  Name and phone number of your driver: __________________  Special Instructions: Shower using CHG 2 nights before surgery and the night before surgery.  If you shower the day of surgery use CHG.  Use special wash - you have one bottle of CHG for all showers.  You should use approximately 1/3 of the bottle for each shower. N/A   Please read over the following fact sheets that you were given: Pain Booklet, Coughing and Deep Breathing and Surgical Site Infection Prevention

## 2012-09-05 MED ORDER — CEFAZOLIN SODIUM-DEXTROSE 2-3 GM-% IV SOLR
2.0000 g | INTRAVENOUS | Status: AC
  Administered 2012-09-06: 2 g via INTRAVENOUS
  Filled 2012-09-05: qty 50

## 2012-09-06 ENCOUNTER — Encounter (HOSPITAL_COMMUNITY): Payer: Self-pay | Admitting: *Deleted

## 2012-09-06 ENCOUNTER — Ambulatory Visit (HOSPITAL_COMMUNITY): Payer: BC Managed Care – PPO | Admitting: *Deleted

## 2012-09-06 ENCOUNTER — Encounter (HOSPITAL_COMMUNITY)
Admission: RE | Disposition: A | Discharge status: Home / Self Care | Payer: Self-pay | Source: Ambulatory Visit | Attending: General Surgery

## 2012-09-06 ENCOUNTER — Observation Stay (HOSPITAL_COMMUNITY)
Admission: RE | Admit: 2012-09-06 | Discharge: 2012-09-08 | Disposition: A | Discharge status: Home / Self Care | Payer: BC Managed Care – PPO | Source: Ambulatory Visit | Attending: General Surgery | Admitting: General Surgery

## 2012-09-06 DIAGNOSIS — K439 Ventral hernia without obstruction or gangrene: Principal | ICD-10-CM | POA: Insufficient documentation

## 2012-09-06 DIAGNOSIS — K432 Incisional hernia without obstruction or gangrene: Secondary | ICD-10-CM

## 2012-09-06 DIAGNOSIS — Z01812 Encounter for preprocedural laboratory examination: Secondary | ICD-10-CM | POA: Insufficient documentation

## 2012-09-06 HISTORY — PX: VENTRAL HERNIA REPAIR: SHX424

## 2012-09-06 HISTORY — PX: INSERTION OF MESH: SHX5868

## 2012-09-06 HISTORY — PX: HERNIA REPAIR: SHX51

## 2012-09-06 SURGERY — REPAIR, HERNIA, VENTRAL, LAPAROSCOPIC
Anesthesia: General | Site: Abdomen | Wound class: Clean

## 2012-09-06 MED ORDER — CEFAZOLIN SODIUM 1-5 GM-% IV SOLN
1.0000 g | Freq: Three times a day (TID) | INTRAVENOUS | Status: AC
  Administered 2012-09-06: 1 g via INTRAVENOUS
  Filled 2012-09-06: qty 50

## 2012-09-06 MED ORDER — OXYCODONE-ACETAMINOPHEN 5-325 MG PO TABS
1.0000 | ORAL_TABLET | ORAL | Status: DC | PRN
  Administered 2012-09-07 – 2012-09-08 (×3): 2 via ORAL
  Filled 2012-09-06 (×3): qty 2

## 2012-09-06 MED ORDER — KCL IN DEXTROSE-NACL 20-5-0.45 MEQ/L-%-% IV SOLN
INTRAVENOUS | Status: DC
  Administered 2012-09-06 – 2012-09-07 (×2): via INTRAVENOUS
  Administered 2012-09-07 (×2): 100 mL/h via INTRAVENOUS
  Filled 2012-09-06 (×6): qty 1000

## 2012-09-06 MED ORDER — PROPOFOL 10 MG/ML IV BOLUS
INTRAVENOUS | Status: DC | PRN
  Administered 2012-09-06: 30 mg via INTRAVENOUS
  Administered 2012-09-06: 110 mg via INTRAVENOUS

## 2012-09-06 MED ORDER — LIDOCAINE HCL (CARDIAC) 20 MG/ML IV SOLN
INTRAVENOUS | Status: DC | PRN
  Administered 2012-09-06: 100 mg via INTRAVENOUS

## 2012-09-06 MED ORDER — BUPIVACAINE-EPINEPHRINE 0.25% -1:200000 IJ SOLN
INTRAMUSCULAR | Status: DC | PRN
  Administered 2012-09-06: 15 mL

## 2012-09-06 MED ORDER — ARTIFICIAL TEARS OP OINT
TOPICAL_OINTMENT | OPHTHALMIC | Status: DC | PRN
  Administered 2012-09-06: 1 via OPHTHALMIC

## 2012-09-06 MED ORDER — ONDANSETRON HCL 4 MG PO TABS: 4.0000 mg | ORAL_TABLET | Freq: Four times a day (QID) | ORAL | Status: DC | PRN

## 2012-09-06 MED ORDER — 0.9 % SODIUM CHLORIDE (POUR BTL) OPTIME
TOPICAL | Status: DC | PRN
  Administered 2012-09-06: 1000 mL

## 2012-09-06 MED ORDER — HYDROMORPHONE HCL PF 1 MG/ML IJ SOLN
INTRAMUSCULAR | Status: AC
  Filled 2012-09-06: qty 1

## 2012-09-06 MED ORDER — FENTANYL CITRATE 0.05 MG/ML IJ SOLN
INTRAMUSCULAR | Status: DC | PRN
  Administered 2012-09-06 (×3): 50 ug via INTRAVENOUS
  Administered 2012-09-06: 100 ug via INTRAVENOUS
  Administered 2012-09-06: 150 ug via INTRAVENOUS

## 2012-09-06 MED ORDER — SODIUM CHLORIDE 0.9 % IR SOLN
Status: DC | PRN
  Administered 2012-09-06: 13:00:00

## 2012-09-06 MED ORDER — MEPERIDINE HCL 25 MG/ML IJ SOLN: 6.2500 mg | INTRAMUSCULAR | Status: DC | PRN

## 2012-09-06 MED ORDER — ONDANSETRON HCL 4 MG/2ML IJ SOLN
INTRAMUSCULAR | Status: DC | PRN
  Administered 2012-09-06: 4 mg via INTRAVENOUS

## 2012-09-06 MED ORDER — ENOXAPARIN SODIUM 40 MG/0.4ML ~~LOC~~ SOLN
40.0000 mg | SUBCUTANEOUS | Status: DC
  Administered 2012-09-07 – 2012-09-08 (×2): 40 mg via SUBCUTANEOUS
  Filled 2012-09-06 (×2): qty 0.4

## 2012-09-06 MED ORDER — LACTATED RINGERS IV SOLN
INTRAVENOUS | Status: DC | PRN
  Administered 2012-09-06 (×2): via INTRAVENOUS

## 2012-09-06 MED ORDER — OXYCODONE HCL 5 MG/5ML PO SOLN: 5.0000 mg | Freq: Once | ORAL | Status: DC | PRN

## 2012-09-06 MED ORDER — BISACODYL 5 MG PO TBEC: 5.0000 mg | DELAYED_RELEASE_TABLET | Freq: Every day | ORAL | Status: DC | PRN

## 2012-09-06 MED ORDER — ONDANSETRON HCL 4 MG/2ML IJ SOLN: 4.0000 mg | Freq: Once | INTRAMUSCULAR | Status: DC | PRN

## 2012-09-06 MED ORDER — LACTATED RINGERS IV SOLN
INTRAVENOUS | Status: DC
  Administered 2012-09-06: 12:00:00 via INTRAVENOUS

## 2012-09-06 MED ORDER — ONDANSETRON HCL 4 MG/2ML IJ SOLN
4.0000 mg | Freq: Four times a day (QID) | INTRAMUSCULAR | Status: DC | PRN
  Administered 2012-09-06: 4 mg via INTRAVENOUS
  Filled 2012-09-06: qty 2

## 2012-09-06 MED ORDER — POLYETHYLENE GLYCOL 3350 17 G PO PACK
17.0000 g | PACK | Freq: Every day | ORAL | Status: DC
  Administered 2012-09-07 – 2012-09-08 (×2): 17 g via ORAL
  Filled 2012-09-06 (×3): qty 1

## 2012-09-06 MED ORDER — OXYCODONE HCL 5 MG PO TABS: 5.0000 mg | ORAL_TABLET | Freq: Once | ORAL | Status: DC | PRN

## 2012-09-06 MED ORDER — MIDAZOLAM HCL 5 MG/5ML IJ SOLN
INTRAMUSCULAR | Status: DC | PRN
  Administered 2012-09-06: 2 mg via INTRAVENOUS

## 2012-09-06 MED ORDER — HYDROMORPHONE HCL PF 1 MG/ML IJ SOLN
0.2500 mg | INTRAMUSCULAR | Status: DC | PRN
  Administered 2012-09-06 (×4): 0.5 mg via INTRAVENOUS

## 2012-09-06 MED ORDER — ROCURONIUM BROMIDE 100 MG/10ML IV SOLN
INTRAVENOUS | Status: DC | PRN
  Administered 2012-09-06: 50 mg via INTRAVENOUS

## 2012-09-06 MED ORDER — HYDROMORPHONE HCL PF 1 MG/ML IJ SOLN
1.0000 mg | INTRAMUSCULAR | Status: DC | PRN
  Administered 2012-09-06 (×3): 2 mg via INTRAVENOUS
  Administered 2012-09-07 (×2): 1 mg via INTRAVENOUS
  Filled 2012-09-06 (×2): qty 2
  Filled 2012-09-06: qty 1
  Filled 2012-09-06: qty 2
  Filled 2012-09-06: qty 1

## 2012-09-06 SURGICAL SUPPLY — 53 items
ADH SKN CLS APL DERMABOND .7 (GAUZE/BANDAGES/DRESSINGS) ×1
APL SKNCLS STERI-STRIP NONHPOA (GAUZE/BANDAGES/DRESSINGS) ×1
APPLIER CLIP LOGIC TI 5 (MISCELLANEOUS) IMPLANT
APPLIER CLIP ROT 10 11.4 M/L (STAPLE)
APR CLP MED LRG 11.4X10 (STAPLE)
APR CLP MED LRG 33X5 (MISCELLANEOUS)
BAG DECANTER FOR FLEXI CONT (MISCELLANEOUS) ×2 IMPLANT
BENZOIN TINCTURE PRP APPL 2/3 (GAUZE/BANDAGES/DRESSINGS) ×2 IMPLANT
BINDER ABD UNIV 12 45-62 (WOUND CARE) IMPLANT
BINDER ABDOMINAL 46IN 62IN (WOUND CARE) ×2
BLADE SURG ROTATE 9660 (MISCELLANEOUS) IMPLANT
CANISTER SUCTION 2500CC (MISCELLANEOUS) IMPLANT
CHLORAPREP W/TINT 26ML (MISCELLANEOUS) ×2 IMPLANT
CLIP APPLIE ROT 10 11.4 M/L (STAPLE) IMPLANT
CLOTH BEACON ORANGE TIMEOUT ST (SAFETY) ×2 IMPLANT
DECANTER SPIKE VIAL GLASS SM (MISCELLANEOUS) ×2 IMPLANT
DERMABOND ADVANCED (GAUZE/BANDAGES/DRESSINGS) ×1
DERMABOND ADVANCED .7 DNX12 (GAUZE/BANDAGES/DRESSINGS) ×1 IMPLANT
DEVICE SECURE STRAP 25 ABSORB (INSTRUMENTS) ×3 IMPLANT
DEVICE TROCAR PUNCTURE CLOSURE (ENDOMECHANICALS) ×2 IMPLANT
DISSECTOR BLUNT TIP ENDO 5MM (MISCELLANEOUS) IMPLANT
DRAPE UTILITY 15X26 W/TAPE STR (DRAPE) ×4 IMPLANT
ELECT REM PT RETURN 9FT ADLT (ELECTROSURGICAL) ×2
ELECTRODE REM PT RTRN 9FT ADLT (ELECTROSURGICAL) ×1 IMPLANT
GLOVE BIO SURGEON STRL SZ7.5 (GLOVE) ×1 IMPLANT
GLOVE BIOGEL PI IND STRL 7.5 (GLOVE) IMPLANT
GLOVE BIOGEL PI IND STRL 8 (GLOVE) ×1 IMPLANT
GLOVE BIOGEL PI INDICATOR 7.5 (GLOVE) ×2
GLOVE BIOGEL PI INDICATOR 8 (GLOVE) ×1
GLOVE ECLIPSE 7.5 STRL STRAW (GLOVE) ×4 IMPLANT
GOWN STRL NON-REIN LRG LVL3 (GOWN DISPOSABLE) ×5 IMPLANT
KIT BASIN OR (CUSTOM PROCEDURE TRAY) ×2 IMPLANT
KIT ROOM TURNOVER OR (KITS) ×2 IMPLANT
MESH PHYSIO OVAL 15X20CM (Mesh General) ×1 IMPLANT
NDL SPNL 22GX3.5 QUINCKE BK (NEEDLE) ×1 IMPLANT
NEEDLE SPNL 22GX3.5 QUINCKE BK (NEEDLE) ×2 IMPLANT
NS IRRIG 1000ML POUR BTL (IV SOLUTION) ×2 IMPLANT
PAD ARMBOARD 7.5X6 YLW CONV (MISCELLANEOUS) ×4 IMPLANT
PEN SKIN MARKING BROAD (MISCELLANEOUS) ×2 IMPLANT
SET IRRIG TUBING LAPAROSCOPIC (IRRIGATION / IRRIGATOR) IMPLANT
SLEEVE ENDOPATH XCEL 5M (ENDOMECHANICALS) ×2 IMPLANT
STAPLER VISISTAT 35W (STAPLE) ×2 IMPLANT
SUT MNCRL AB 4-0 PS2 18 (SUTURE) ×2 IMPLANT
SUT NOVA 0 T19/GS 22DT (SUTURE) ×4 IMPLANT
SUT VICRYL 0 TIES 12 18 (SUTURE) IMPLANT
SUT VICRYL 0 UR6 27IN ABS (SUTURE) IMPLANT
TOWEL OR 17X24 6PK STRL BLUE (TOWEL DISPOSABLE) ×2 IMPLANT
TOWEL OR 17X26 10 PK STRL BLUE (TOWEL DISPOSABLE) ×2 IMPLANT
TRAY FOLEY CATH 14FRSI W/METER (CATHETERS) ×2 IMPLANT
TRAY LAPAROSCOPIC (CUSTOM PROCEDURE TRAY) ×2 IMPLANT
TROCAR XCEL NON-BLD 11X100MML (ENDOMECHANICALS) ×2 IMPLANT
TROCAR XCEL NON-BLD 5MMX100MML (ENDOMECHANICALS) ×2 IMPLANT
WATER STERILE IRR 1000ML POUR (IV SOLUTION) ×1 IMPLANT

## 2012-09-06 NOTE — Anesthesia Preprocedure Evaluation (Addendum)
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 negative neurological ROS  negative psych ROS   GI/Hepatic Neg liver ROS, GERD-  Controlled,  Endo/Other  negative endocrine ROSMorbid obesity  Renal/GU Pt denies Kidney dz  negative genitourinary   Musculoskeletal negative musculoskeletal ROS (+)   Abdominal   Peds  Hematology negative hematology ROS (+)   Anesthesia Other Findings   Reproductive/Obstetrics                           Anesthesia Physical Anesthesia Plan  ASA: III  Anesthesia Plan: General   Post-op Pain Management:    Induction: Intravenous  Airway Management Planned: Oral ETT  Additional Equipment:   Intra-op Plan:   Post-operative Plan: Extubation in OR  Informed Consent: I have reviewed the patients History and Physical, chart, labs and discussed the procedure including the risks, benefits and alternatives for the proposed anesthesia with the patient or authorized representative who has indicated his/her understanding and acceptance.     Plan Discussed with: CRNA and Surgeon  Anesthesia Plan Comments:        Anesthesia Quick Evaluation

## 2012-09-06 NOTE — Anesthesia Postprocedure Evaluation (Signed)
  Anesthesia Post-op Note  Patient: Mary [name]  Procedure(s) Performed: Procedure(s) (LRB) with comments: LAPAROSCOPIC VENTRAL HERNIA (N/A) INSERTION OF MESH (N/A)  Patient Location: PACU  Anesthesia Type: General  Level of Consciousness: awake  Airway and Oxygen Therapy: Patient Spontanous Breathing  Post-op Pain: mild  Post-op Assessment: Post-op Vital signs reviewed  Post-op Vital Signs: Reviewed  Complications: No apparent anesthesia complications

## 2012-09-06 NOTE — Op Note (Signed)
OPERATIVE REPORT  DATE OF OPERATION: 09/06/2012  PATIENT:  Mary [name]  51 y.o. female  PRE-OPERATIVE DIAGNOSIS:   ventral hernia  POST-OPERATIVE DIAGNOSIS:   ventral hernia  PROCEDURE:  Procedure(s): LAPAROSCOPIC VENTRAL HERNIA INSERTION OF MESH  SURGEON:  Surgeon(s): Cherylynn Ridges, MD  ASSISTANT: None  ANESTHESIA:   general  EBL: <50 ml  BLOOD ADMINISTERED: none  DRAINS: none   SPECIMEN:  No Specimen  COUNTS CORRECT:  YES  PROCEDURE DETAILS: The patient was taken to the operating room and placed on the table in supine position. After an adequate general endotracheal anesthetic was administered he was prepped and draped in usual sterile manner exposing the entire abdomen.   After proper time out was performed identifying the patient and the procedure to be performed a right upper quadrant transverse incision was made allowing the use of an Optiview trocar and cannula to be passed into the peritoneal cavity with the inserted the laparoscope with attached camera and light source. We entered the peritoneal cavity safely, however this did take at least two passes because of the flexibility of the patient's abdominal wall.. We subsequently insufflated carbon dioxide gas through the trocar up to a maximal intra-abdominal pressure 15mm of mercury. We subsequently placed a right lower quadrant 5 mm cannula and the left lower quadrant 5 mm cannula under direct vision.  An additional RUQ 5 mm cannula was also placed for orientation of the mesh subsequently.  There will omental adhesions into the hernia sac on the anterior abdominal wall along with part of the transverse colon.which were directly visualized. Using a laparoscopic [place] dissector and a grasper we dissected out the omentum and the colon from the hernia sac. This left a large defect actually 2 defects which were side by side measuring approximately 12 x 12 cm in size. We used a piece of mesh measuring 20 x 15 cm in size  in order to repair the hernia.   The mesh was soaked in antibiotic solution. Interrupted sutures of #1 Novafil were placed in 8 equally space places along the edge of the mesh. We then inserted the rolled up mesh into the peritoneal cavity through the 10 mm cannula. We oriented the mesh in a proper position then used a suture retriever in order to retrieve the 8 equally spaced the sutures on the mesh to the anterior abdominal wall. These were tied down through the small incisions where the sutures were retrieved. This brought up the mesh to the anterior abdominal wall covering the hernia defect. We then used a SecureStrap tacker in order to tack down the mesh circumferentially around the hernia defect. Once this was done the mesh was in appropriate position. We allowed the gas to escape from the peritoneal cavity as the mesh came down.   All cannulas were removed. We reapproximated the skin at the 10 mm trocar site using a running subcuticular suture for Monocryl. Dermabond and Steri-Strips and Tegaderms use complete the dressings all other sites. All counts were correct   PATIENT DISPOSITION:  PACU - hemodynamically stable.   Dawsen Krieger O 10/28/20132:50 PM

## 2012-09-06 NOTE — Preoperative (Signed)
Beta Blockers   Reason not to administer Beta Blockers:Not Applicable 

## 2012-09-06 NOTE — Transfer of Care (Signed)
Immediate Anesthesia Transfer of Care Note  Patient: [name]  Procedure(s) Performed: Procedure(s) (LRB) with comments: LAPAROSCOPIC VENTRAL HERNIA (N/A) INSERTION OF MESH (N/A)  Patient Location: PACU  Anesthesia Type:No value filed.  Level of Consciousness: awake  Airway & Oxygen Therapy: Patient Spontanous Breathing and Patient connected to nasal cannula oxygen  Post-op Assessment: Report given to PACU RN, Post -op Vital signs reviewed and stable and Patient moving all extremities  Post vital signs: Reviewed and stable  Complications: No apparent anesthesia complications

## 2012-09-06 NOTE — H&P (Signed)
  This patient is well known to me after resection of colon cancer and getting chemotherapy, now has a ventral hernia for repair.  Cherylynn Ridges, MD Physician Signed  Progress Notes 06/16/2012 2:22 PM  Related encounter: Office Visit from 06/16/2012 in [facility], [place]   The patient comes in today with a 4.0cm defect at the superior aspect of the abdominal incision. There isnot incarceration. Minimally tender. Otherwise doing well.  This occurred after significant lifting at work. Will plan on operative laparoscopic repair. ASAP.     Exam is consistent with ventral hernia.  Otherwise is doing well.  Marta Lamas. Gae Bon, MD, FACS [phone] [facility]

## 2012-09-07 ENCOUNTER — Telehealth (INDEPENDENT_AMBULATORY_CARE_PROVIDER_SITE_OTHER): Payer: Self-pay

## 2012-09-07 MED ORDER — MORPHINE SULFATE 2 MG/ML IJ SOLN
2.0000 mg | INTRAMUSCULAR | Status: DC | PRN
  Administered 2012-09-07 (×3): 2 mg via INTRAVENOUS
  Filled 2012-09-07 (×3): qty 1

## 2012-09-07 MED ORDER — DIPHENHYDRAMINE HCL 50 MG/ML IJ SOLN
12.5000 mg | Freq: Four times a day (QID) | INTRAMUSCULAR | Status: DC | PRN
Start: 2012-09-07 — End: 2012-09-08
  Administered 2012-09-07 (×2): 12.5 mg via INTRAVENOUS
  Filled 2012-09-07 (×2): qty 1

## 2012-09-07 MED ORDER — INFLUENZA VIRUS VACC SPLIT PF IM SUSP
0.5000 mL | INTRAMUSCULAR | Status: AC
  Administered 2012-09-08: 0.5 mL via INTRAMUSCULAR
  Filled 2012-09-07: qty 0.5

## 2012-09-07 NOTE — Progress Notes (Signed)
4098 Patient started c/o itching after dilaudid given at 0027 0059 Dr. Donell Beers notified and orders received for benadryl. 0106 Benadryl 12.5mg  given IV for itching. Will continue to monitor.

## 2012-09-07 NOTE — Telephone Encounter (Signed)
Called patient and gave her post op appointment for Nov 13 @ 3:15

## 2012-09-07 NOTE — Progress Notes (Signed)
GS Progress Note Subjective: Patient states that she is nauseated and having a lot of pain.  Has not taken much by mouth.  Objective: Vital signs in last 24 hours: Temp:  [96.3 F (35.7 C)-98.2 F (36.8 C)] 98.1 F (36.7 C) (10/29 0657) Pulse Rate:  [52-68] 64  (10/29 0657) Resp:  [16-25] 16  (10/29 0657) BP: (130-146)/(64-84) 139/73 mmHg (10/29 0657) SpO2:  [98 %-100 %] 100 % (10/29 0657) Weight:  [106.731 kg (235 lb 4.8 oz)] 106.731 kg (235 lb 4.8 oz) (10/28 1708) Last BM Date: 09/06/12  Intake/Output from previous day: 10/28 0701 - 10/29 0700 In: 2585 [P.O.:60; I.V.:2525] Out: 800 [Urine:800] Intake/Output this shift: Total I/O In: -  Out: 350 [Urine:350]  Lungs: Clear  Abd: Good bowel sounds.  No evidence of recurrence.  Wounds are clean and dry.  Extremities: No DVT signs or symptoms.  Neuro: Intact  Lab Results: CBC  No results found for this basename: WBC:2,HGB:2,HCT:2,PLT:2 in the last 72 hours BMET No results found for this basename: NA:2,K:2,CL:2,CO2:2,GLUCOSE:2,BUN:2,CREATININE:2,CALCIUM:2 in the last 72 hours PT/INR No results found for this basename: LABPROT:2,INR:2 in the last 72 hours ABG No results found for this basename: PHART:2,PCO2:2,PO2:2,HCO3:2 in the last 72 hours  Studies/Results: No results found.  Anti-infectives: Anti-infectives     Start     Dose/Rate Route Frequency Ordered Stop   09/06/12 1730   ceFAZolin (ANCEF) IVPB 1 g/50 mL premix        1 g 100 mL/hr over 30 Minutes Intravenous 3 times per day 09/06/12 1715 09/06/12 1848   09/06/12 1300   polymyxin B 500,000 Units, bacitracin 50,000 Units in sodium chloride irrigation 0.9 % 500 mL irrigation  Status:  Discontinued          As needed 09/06/12 1301 09/06/12 1454   09/05/12 1451   ceFAZolin (ANCEF) IVPB 2 g/50 mL premix        2 g 100 mL/hr over 30 Minutes Intravenous 60 min pre-op 09/05/12 1451 09/06/12 1258          Assessment/Plan: s/p Procedure(s): LAPAROSCOPIC  VENTRAL HERNIA INSERTION OF MESH Advance diet Plan for discharge tomorrow Possible discharge later today if she feels much better. Will switch to morphine IV for pain control.   LOS: 1 day    Marta Lamas. Gae Bon, MD, FACS [phone] [facility] 09/07/2012

## 2012-09-08 MED ORDER — OXYCODONE-ACETAMINOPHEN 5-325 MG PO TABS: 1.0000 | ORAL_TABLET | ORAL | Status: DC | PRN

## 2012-09-08 NOTE — Progress Notes (Signed)
PT Cancellation Note  Patient Details Name: Mary [name] MRN: [id number] DOB: [birth year]   Cancelled Treatment:    Reason Eval/Treat Not Completed: Pt already discharged.   [facility] HELEN 09/08/2012, 3:24 PM Pager: [phone]

## 2012-09-08 NOTE — Progress Notes (Signed)
Pt discharged to home with husband via car. Discharge instructions explained pt verbalized understanding.  

## 2012-09-08 NOTE — Progress Notes (Signed)
UR chart review completed.  

## 2012-09-08 NOTE — Discharge Summary (Signed)
Physician Discharge Summary  Patient ID: Mary [name] MRN: [id number] DOB/AGE: [birth year] 51 y.o.  Admit date: 09/06/2012 Discharge date: 09/08/2012  Admission Diagnoses:  Discharge Diagnoses:  Active Problems:  * No active hospital problems. *    Discharged Condition: good  Hospital Course: Admitted after laparoscopic hernia repair with mesh.  Significant pain POD #1 requiring addition 24 hour stay.  Doing well the day of discharge although having problems getting out of bed.  PT was ordered, but the patient did not want to wait for their involvement.  Being discharged to home.  Consults: None  Significant Diagnostic Studies: None  Treatments: IV hydration, analgesia: Dilaudid and Percocet and respiratory therapy: incentive spirometry  Discharge Exam: Blood pressure 134/75, pulse 63, temperature 98.3 F (36.8 C), temperature source Oral, resp. rate 16, height 5\' 10"  (1.778 m), weight 106.731 kg (235 lb 4.8 oz), SpO2 99.00%. General appearance: alert, cooperative and mild distress Resp: clear to auscultation bilaterally GI: soft, mildly tender, good bowel sounds.  Disposition: 01-Home or Self Care  Discharge Orders    Future Appointments: Provider: Department: Dept Phone: Center:   09/22/2012 3:15 PM Cherylynn Ridges, MD [facility] [phone] None   10/21/2012 2:45 PM Joycie Peek [facility] [phone] None   10/21/2012 3:15 PM Rana Snare, NP [facility] [phone] None       Medication List     As of 09/08/2012  1:31 PM    ASK your doctor about these medications         bisacodyl 5 MG EC tablet   Commonly known as: DULCOLAX   Take 5 mg by mouth daily as needed. For constipation.      IRON PO   Take 325 mg by mouth 2 (two) times daily.      polyethylene glycol packet   Commonly known as: MIRALAX / GLYCOLAX   Take 17 g by mouth daily.           Follow-up Information    Follow up with Adrain Butrick, Marta Lamas, MD. In 2 weeks.   Contact information:   [address] [place], [place] [address] [phone]          Signed: Cherylynn Ridges 09/08/2012, 1:31 PM

## 2012-09-08 NOTE — Progress Notes (Signed)
GS Progress Note Subjective: Patient is still uncomfortable, but better.  Objective: Vital signs in last 24 hours: Temp:  [98.3 F (36.8 C)-99.4 F (37.4 C)] 98.3 F (36.8 C) (10/30 0508) Pulse Rate:  [63-75] 63  (10/30 0508) Resp:  [16-18] 16  (10/30 0508) BP: (121-142)/(64-75) 134/75 mmHg (10/30 0508) SpO2:  [99 %-100 %] 99 % (10/30 0508) Last BM Date: 09/06/12  Intake/Output from previous day: 10/29 0701 - 10/30 0700 In: 3360 [P.O.:960; I.V.:2400] Out: 2100 [Urine:2100] Intake/Output this shift:    Lungs: Clear  Abd: Soft, good bowel sounds.  Mildly tender  Extremities: No DVT signs or symptoms  Neuro: Intact  Lab Results: CBC  No results found for this basename: WBC:2,HGB:2,HCT:2,PLT:2 in the last 72 hours BMET No results found for this basename: NA:2,K:2,CL:2,CO2:2,GLUCOSE:2,BUN:2,CREATININE:2,CALCIUM:2 in the last 72 hours PT/INR No results found for this basename: LABPROT:2,INR:2 in the last 72 hours ABG No results found for this basename: PHART:2,PCO2:2,PO2:2,HCO3:2 in the last 72 hours  Studies/Results: No results found.  Anti-infectives: Anti-infectives     Start     Dose/Rate Route Frequency Ordered Stop   09/06/12 1730   ceFAZolin (ANCEF) IVPB 1 g/50 mL premix        1 g 100 mL/hr over 30 Minutes Intravenous 3 times per day 09/06/12 1715 09/06/12 1848   09/06/12 1300   polymyxin B 500,000 Units, bacitracin 50,000 Units in sodium chloride irrigation 0.9 % 500 mL irrigation  Status:  Discontinued          As needed 09/06/12 1301 09/06/12 1454   09/05/12 1451   ceFAZolin (ANCEF) IVPB 2 g/50 mL premix        2 g 100 mL/hr over 30 Minutes Intravenous 60 min pre-op 09/05/12 1451 09/06/12 1258          Assessment/Plan: s/p Procedure(s): LAPAROSCOPIC VENTRAL HERNIA INSERTION OF MESH Advance diet Discharge Will get PT to see prior to discharge to help patient cope with pain and show her techniques for getting OOB.  Also will get IS as ordered  yesterday.  LOS: 2 days    Marta Lamas. Gae Bon, MD, FACS [phone] [facility] 09/08/2012

## 2012-09-09 ENCOUNTER — Encounter (HOSPITAL_COMMUNITY): Payer: Self-pay | Admitting: General Surgery

## 2012-09-22 ENCOUNTER — Ambulatory Visit (INDEPENDENT_AMBULATORY_CARE_PROVIDER_SITE_OTHER): Payer: BC Managed Care – PPO | Admitting: General Surgery

## 2012-09-22 ENCOUNTER — Encounter (INDEPENDENT_AMBULATORY_CARE_PROVIDER_SITE_OTHER): Payer: Self-pay | Admitting: General Surgery

## 2012-09-22 VITALS — BP 116/76 | HR 79 | Temp 98.4°F | Ht 69.0 in | Wt 224.6 lb

## 2012-09-22 DIAGNOSIS — K469 Unspecified abdominal hernia without obstruction or gangrene: Secondary | ICD-10-CM | POA: Insufficient documentation

## 2012-09-22 MED ORDER — OXYCODONE-ACETAMINOPHEN 5-325 MG PO TABS: 1.0000 | ORAL_TABLET | Freq: Four times a day (QID) | ORAL | Status: DC | PRN

## 2012-09-22 NOTE — Addendum Note (Signed)
Addended by: Frederik Schmidt on: 09/22/2012 03:45 PM   Modules accepted: Orders

## 2012-09-22 NOTE — Progress Notes (Signed)
The patient is doing well status post laparoscopic ventral hernia repair.  On examination today she has no evidence of recurrent hernia.  She has pain at the sites where the mesh is sutured to the in stable to her abdominal wall and fascia. The wounds show no evidence of infection. She was wearing her binder when she came in to the office today. I examined her in the standing position and she was able to cough and Valsalva without any evidence of recurrent hernia.  I will re\re prescribe the patient 30 Percocet tablets to take as needed. I will see her again in approximately one month for recheck.

## 2012-10-19 ENCOUNTER — Ambulatory Visit (INDEPENDENT_AMBULATORY_CARE_PROVIDER_SITE_OTHER): Payer: BC Managed Care – PPO | Admitting: General Surgery

## 2012-10-19 ENCOUNTER — Encounter (INDEPENDENT_AMBULATORY_CARE_PROVIDER_SITE_OTHER): Payer: Self-pay | Admitting: General Surgery

## 2012-10-19 VITALS — BP 128/88 | HR 82 | Temp 98.2°F | Ht 69.0 in | Wt 227.6 lb

## 2012-10-19 DIAGNOSIS — G8918 Other acute postprocedural pain: Secondary | ICD-10-CM

## 2012-10-19 MED ORDER — OXYCODONE-ACETAMINOPHEN 5-325 MG PO TABS: 1.0000 | ORAL_TABLET | Freq: Four times a day (QID) | ORAL | Status: DC | PRN

## 2012-10-19 NOTE — Progress Notes (Signed)
The patient is doing okay but continues to have lower abdominal pain at the site of the hernia repair. There is no evidence of recurrent hernia.  We have to give her some more Percocet tablets to control her pain. I will keep her off work for the sternum 2 weeks and see her back in clinic to see if we can clear her to go back to work where she will have to do some heavy lifting.  I will see her again in 2 weeks.

## 2012-10-21 ENCOUNTER — Ambulatory Visit (HOSPITAL_BASED_OUTPATIENT_CLINIC_OR_DEPARTMENT_OTHER): Payer: BC Managed Care – PPO | Admitting: Lab

## 2012-10-21 ENCOUNTER — Ambulatory Visit (HOSPITAL_BASED_OUTPATIENT_CLINIC_OR_DEPARTMENT_OTHER): Payer: BC Managed Care – PPO | Admitting: Nurse Practitioner

## 2012-10-21 ENCOUNTER — Telehealth: Payer: Self-pay | Admitting: Oncology

## 2012-10-21 VITALS — BP 144/85 | HR 72 | Temp 98.4°F | Resp 18 | Ht 69.0 in | Wt 231.2 lb

## 2012-10-21 DIAGNOSIS — D509 Iron deficiency anemia, unspecified: Secondary | ICD-10-CM

## 2012-10-21 DIAGNOSIS — C189 Malignant neoplasm of colon, unspecified: Secondary | ICD-10-CM

## 2012-10-21 DIAGNOSIS — I82629 Acute embolism and thrombosis of deep veins of unspecified upper extremity: Secondary | ICD-10-CM

## 2012-10-21 DIAGNOSIS — D649 Anemia, unspecified: Secondary | ICD-10-CM

## 2012-10-21 DIAGNOSIS — G589 Mononeuropathy, unspecified: Secondary | ICD-10-CM

## 2012-10-21 LAB — CBC & DIFF AND RETIC
BASO%: 0.5 % (ref 0.0–2.0)
Basophils Absolute: 0 10*3/uL (ref 0.0–0.1)
EOS%: 3.9 % (ref 0.0–7.0)
Eosinophils Absolute: 0.2 10*3/uL (ref 0.0–0.5)
HCT: 34.4 % — ABNORMAL LOW (ref 34.8–46.6)
HGB: 11.1 g/dL — ABNORMAL LOW (ref 11.6–15.9)
Immature Retic Fract: 10.5 % — ABNORMAL HIGH (ref 1.60–10.00)
LYMPH%: 35.9 % (ref 14.0–49.7)
MCH: 22.7 pg — ABNORMAL LOW (ref 25.1–34.0)
MCHC: 32.3 g/dL (ref 31.5–36.0)
MCV: 70.3 fL — ABNORMAL LOW (ref 79.5–101.0)
MONO#: 0.5 10*3/uL (ref 0.1–0.9)
MONO%: 8.3 % (ref 0.0–14.0)
NEUT#: 3 10*3/uL (ref 1.5–6.5)
NEUT%: 51.4 % (ref 38.4–76.8)
Platelets: 174 10*3/uL (ref 145–400)
RBC: 4.89 10*6/uL (ref 3.70–5.45)
RDW: 17 % — ABNORMAL HIGH (ref 11.2–14.5)
Retic %: 0.99 % (ref 0.70–2.10)
Retic Ct Abs: 48.41 10*3/uL (ref 33.70–90.70)
WBC: 5.9 10*3/uL (ref 3.9–10.3)
lymph#: 2.1 10*3/uL (ref 0.9–3.3)
nRBC: 0 % (ref 0–0)
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
## 2012-10-21 NOTE — Telephone Encounter (Signed)
gv and printed appt schedule for pt for June 2014....gv pt Barium..the patient aware central scheduling will contact with d/t of ct.

## 2012-10-22 LAB — CEA: CEA: 0.9 ng/mL (ref 0.0–5.0)

## 2012-10-25 ENCOUNTER — Other Ambulatory Visit: Payer: Self-pay | Admitting: Oncology

## 2012-10-25 DIAGNOSIS — C189 Malignant neoplasm of colon, unspecified: Secondary | ICD-10-CM

## 2012-10-25 DIAGNOSIS — D649 Anemia, unspecified: Secondary | ICD-10-CM

## 2012-10-25 LAB — FERRITIN: Ferritin: 57 ng/mL (ref 10–291)

## 2012-10-27 ENCOUNTER — Telehealth: Payer: Self-pay | Admitting: *Deleted

## 2012-10-27 NOTE — Telephone Encounter (Signed)
Message copied by Wandalee Ferdinand on Wed Oct 27, 2012  9:42 AM ------      Message from: Ladene Artist      Created: Sat Oct 23, 2012 10:18 AM       Please call patient, cea is normal, add ferritin to 12/12 lab

## 2012-10-27 NOTE — Telephone Encounter (Signed)
Notified of normal CEA. 

## 2012-11-02 ENCOUNTER — Ambulatory Visit (INDEPENDENT_AMBULATORY_CARE_PROVIDER_SITE_OTHER): Payer: BC Managed Care – PPO | Admitting: General Surgery

## 2012-11-02 ENCOUNTER — Encounter (INDEPENDENT_AMBULATORY_CARE_PROVIDER_SITE_OTHER): Payer: Self-pay | Admitting: General Surgery

## 2012-11-02 VITALS — BP 128/74 | HR 76 | Temp 97.8°F | Resp 18 | Ht 69.0 in | Wt 229.1 lb

## 2012-11-02 DIAGNOSIS — Z09 Encounter for follow-up examination after completed treatment for conditions other than malignant neoplasm: Secondary | ICD-10-CM

## 2012-11-02 NOTE — Progress Notes (Signed)
The patient comes in today for final check. She has no evidence of recurrent hernia. She has minimal discomfort. She can return to work on the following Monday. She is to return to see me on an as-needed basis.

## 2012-11-04 ENCOUNTER — Encounter (INDEPENDENT_AMBULATORY_CARE_PROVIDER_SITE_OTHER): Payer: Self-pay

## 2012-11-08 ENCOUNTER — Telehealth (INDEPENDENT_AMBULATORY_CARE_PROVIDER_SITE_OTHER): Payer: Self-pay | Admitting: General Surgery

## 2012-11-08 NOTE — Telephone Encounter (Signed)
Pt called for refill of pain meds; she had OV with Dr. Lindie Spruce on 11/02/12.  Paged and updated Dr. Lindie Spruce.  Ordered Hydrocodone 5/325 mg, # 20, 1 po Q6H prn pain, no further refills.  Called meds to [facility]:  [phone].  LM on pts cell/ VM to pick up Rx and there will be no further refills from Dr. Lindie Spruce.

## 2012-11-08 NOTE — Telephone Encounter (Signed)
Pt called for pain med refill; she was here to see Dr.

## 2012-11-11 ENCOUNTER — Telehealth: Payer: Self-pay | Admitting: *Deleted

## 2012-11-11 NOTE — Telephone Encounter (Signed)
Received call from pt stating "my neuropathy is getting worse in feet (no neuropathy in hands), right foot swells sometimes and is very painful with flare-up.  Both feet are numb states, "I was taking Neurontin but now am not on anything to help this and my next appt is not until 04/25/13; is there anything else they could put me on to help this?"  Informed pt Dr. Truett Perna out of office but will discuss with Lonna Cobb, NP and get back with pt.  Pt verbalized understanding.  Note to Lonna Cobb, NP

## 2012-11-12 ENCOUNTER — Other Ambulatory Visit: Payer: Self-pay | Admitting: *Deleted

## 2012-11-12 MED ORDER — PREGABALIN 50 MG PO CAPS: 50.0000 mg | ORAL_CAPSULE | Freq: Three times a day (TID) | ORAL | Status: DC

## 2012-11-12 NOTE — Telephone Encounter (Signed)
Called and spoke with pt; instructed her that new prescription for neuropathy--Lyrica 50 mg will be called into pharmacy--instructed pt to take three times a day and schedulers will be calling to set-up f/u appt in 2-3 weeks.  Pt verbalized understanding and expressed appreciation for call.

## 2012-11-17 ENCOUNTER — Telehealth: Payer: Self-pay | Admitting: Oncology

## 2012-11-17 NOTE — Telephone Encounter (Signed)
l/m with 1/23 appt info     anne

## 2012-12-02 ENCOUNTER — Ambulatory Visit: Payer: BC Managed Care – PPO | Admitting: Nurse Practitioner

## 2013-01-03 ENCOUNTER — Telehealth: Payer: Self-pay | Admitting: *Deleted

## 2013-01-03 MED ORDER — PREGABALIN 75 MG PO CAPS: 75.0000 mg | ORAL_CAPSULE | Freq: Three times a day (TID) | ORAL | Status: DC

## 2013-01-03 NOTE — Telephone Encounter (Addendum)
Call from pt requesting to increase Lyrica dose. Stated it is no longer effective. Pt reports pain, and tingling feet bilaterally. Reviewed with Dr. Truett Perna: order received to increase Lyrica to 75 mg three times daily. Order called to pharmacy.

## 2013-01-21 ENCOUNTER — Telehealth: Payer: Self-pay | Admitting: *Deleted

## 2013-01-21 NOTE — Telephone Encounter (Signed)
Call from pt reporting BLE edema. R greater than L. Denies erythema or pain. Asking if MD will prescribe fluid pill. Reviewed with Dr. Truett Perna: pt needs to see a PCP to evaluate this. Called pt with instructions to see PCP. She voiced understanding. Does not currently see a primary MD but will call to establish one. Stated she may go to Urgent Care to have this evaluated.

## 2013-03-21 ENCOUNTER — Other Ambulatory Visit: Payer: BC Managed Care – PPO | Admitting: Lab

## 2013-03-24 ENCOUNTER — Ambulatory Visit: Payer: BC Managed Care – PPO | Admitting: Oncology

## 2013-04-01 ENCOUNTER — Telehealth: Payer: Self-pay | Admitting: *Deleted

## 2013-04-01 NOTE — Telephone Encounter (Signed)
Reports her neuropathy in her feet is getting worse. Stinging and burning w/numbness. Asking if her Lyrica can be increased to 100 mg tid from 75 mg tid?

## 2013-04-07 ENCOUNTER — Other Ambulatory Visit: Payer: Self-pay | Admitting: *Deleted

## 2013-04-07 MED ORDER — PREGABALIN 100 MG PO CAPS: 100.0000 mg | ORAL_CAPSULE | Freq: Three times a day (TID) | ORAL | Status: DC

## 2013-04-07 NOTE — Telephone Encounter (Signed)
Spoke with pt regarding request to increase Lyrica.  Per MD, pt informed that per pharmacy stated with Lyrica any dose great 300 mg per day is rarely effective for neuropathy.  Pt also informed she will have to pick-up script.  Pt verbalized understanding of information and stated she will be by to p/u script today.

## 2013-04-21 ENCOUNTER — Ambulatory Visit (HOSPITAL_COMMUNITY)
Admission: RE | Admit: 2013-04-21 | Discharge: 2013-04-21 | Disposition: A | Discharge status: Home / Self Care | Payer: BC Managed Care – PPO | Source: Ambulatory Visit | Attending: Nurse Practitioner | Admitting: Nurse Practitioner

## 2013-04-21 ENCOUNTER — Other Ambulatory Visit (HOSPITAL_BASED_OUTPATIENT_CLINIC_OR_DEPARTMENT_OTHER): Payer: BC Managed Care – PPO | Admitting: Lab

## 2013-04-21 DIAGNOSIS — C189 Malignant neoplasm of colon, unspecified: Secondary | ICD-10-CM

## 2013-04-21 DIAGNOSIS — K5730 Diverticulosis of large intestine without perforation or abscess without bleeding: Secondary | ICD-10-CM | POA: Insufficient documentation

## 2013-04-21 DIAGNOSIS — K429 Umbilical hernia without obstruction or gangrene: Secondary | ICD-10-CM | POA: Insufficient documentation

## 2013-04-21 DIAGNOSIS — IMO0002 Reserved for concepts with insufficient information to code with codable children: Secondary | ICD-10-CM | POA: Insufficient documentation

## 2013-04-21 DIAGNOSIS — Z90710 Acquired absence of both cervix and uterus: Secondary | ICD-10-CM | POA: Insufficient documentation

## 2013-04-21 DIAGNOSIS — D1803 Hemangioma of intra-abdominal structures: Secondary | ICD-10-CM | POA: Insufficient documentation

## 2013-04-21 LAB — COMPREHENSIVE METABOLIC PANEL (CC13)
ALT: 15 U/L (ref 0–55)
AST: 16 U/L (ref 5–34)
Albumin: 3.5 g/dL (ref 3.5–5.0)
Alkaline Phosphatase: 101 U/L (ref 40–150)
BUN: 14.8 mg/dL (ref 7.0–26.0)
CO2: 25 mEq/L (ref 22–29)
Calcium: 9.2 mg/dL (ref 8.4–10.4)
Chloride: 108 mEq/L — ABNORMAL HIGH (ref 98–107)
Creatinine: 1.1 mg/dL (ref 0.6–1.1)
Glucose: 113 mg/dl — ABNORMAL HIGH (ref 70–99)
Potassium: 3.8 mEq/L (ref 3.5–5.1)
Sodium: 140 mEq/L (ref 136–145)
Total Bilirubin: 0.38 mg/dL (ref 0.20–1.20)
Total Protein: 7.3 g/dL (ref 6.4–8.3)

## 2013-04-21 LAB — CBC WITH DIFFERENTIAL/PLATELET
BASO%: 1.1 % (ref 0.0–2.0)
Basophils Absolute: 0.1 10*3/uL (ref 0.0–0.1)
EOS%: 3.5 % (ref 0.0–7.0)
Eosinophils Absolute: 0.2 10*3/uL (ref 0.0–0.5)
HCT: 37.5 % (ref 34.8–46.6)
HGB: 12.2 g/dL (ref 11.6–15.9)
LYMPH%: 34.2 % (ref 14.0–49.7)
MCH: 22.7 pg — ABNORMAL LOW (ref 25.1–34.0)
MCHC: 32.4 g/dL (ref 31.5–36.0)
MCV: 70 fL — ABNORMAL LOW (ref 79.5–101.0)
MONO#: 0.4 10*3/uL (ref 0.1–0.9)
MONO%: 8 % (ref 0.0–14.0)
NEUT#: 3 10*3/uL (ref 1.5–6.5)
NEUT%: 53.2 % (ref 38.4–76.8)
Platelets: 178 10*3/uL (ref 145–400)
RBC: 5.36 10*6/uL (ref 3.70–5.45)
RDW: 18.1 % — ABNORMAL HIGH (ref 11.2–14.5)
WBC: 5.6 10*3/uL (ref 3.9–10.3)
lymph#: 1.9 10*3/uL (ref 0.9–3.3)

## 2013-04-21 LAB — CEA: CEA: 1.2 ng/mL (ref 0.0–5.0)

## 2013-04-21 MED ORDER — IOHEXOL 300 MG/ML  SOLN
100.0000 mL | Freq: Once | INTRAMUSCULAR | Status: AC | PRN
  Administered 2013-04-21: 100 mL via INTRAVENOUS

## 2013-04-22 ENCOUNTER — Telehealth: Payer: Self-pay | Admitting: *Deleted

## 2013-04-22 NOTE — Telephone Encounter (Signed)
Message copied by Wandalee Ferdinand on Fri Apr 22, 2013  6:49 PM ------      Message from: Thornton Papas B      Created: Thu Apr 21, 2013  9:04 PM       Please call patient, negative CTs , f/u as scheduled ------

## 2013-04-22 NOTE — Telephone Encounter (Signed)
Attempted to call patient w/CT results. No answer and mailbox was full, so no message could be left. She sees MD on 6/16

## 2013-04-25 ENCOUNTER — Telehealth: Payer: Self-pay | Admitting: Oncology

## 2013-04-25 ENCOUNTER — Ambulatory Visit (HOSPITAL_BASED_OUTPATIENT_CLINIC_OR_DEPARTMENT_OTHER): Payer: BC Managed Care – PPO | Admitting: Oncology

## 2013-04-25 VITALS — BP 136/85 | HR 75 | Temp 97.8°F | Resp 18 | Ht 69.0 in | Wt 232.3 lb

## 2013-04-25 DIAGNOSIS — C189 Malignant neoplasm of colon, unspecified: Secondary | ICD-10-CM
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
## 2013-04-25 NOTE — Telephone Encounter (Signed)
gv and printed appt sched and avs for pt.Marland KitchenMarland KitchenMarland KitchenPt said she already has an appt with Dr. Loreta Ave for the beginning of July for colsultation for colonoscopy....she did not want me to call office.

## 2013-10-23 ENCOUNTER — Encounter (HOSPITAL_COMMUNITY): Payer: Self-pay | Admitting: Emergency Medicine

## 2013-10-23 ENCOUNTER — Emergency Department (INDEPENDENT_AMBULATORY_CARE_PROVIDER_SITE_OTHER)
Admission: EM | Admit: 2013-10-23 | Discharge: 2013-10-23 | Disposition: A | Discharge status: Home / Self Care | Payer: BC Managed Care – PPO | Source: Home / Self Care | Attending: Emergency Medicine | Admitting: Emergency Medicine

## 2013-10-23 ENCOUNTER — Ambulatory Visit (HOSPITAL_COMMUNITY)
Admit: 2013-10-23 | Discharge: 2013-10-23 | Disposition: A | Discharge status: Home / Self Care | Payer: BC Managed Care – PPO | Source: Ambulatory Visit | Attending: Emergency Medicine | Admitting: Emergency Medicine

## 2013-10-23 DIAGNOSIS — M7121 Synovial cyst of popliteal space [Baker], right knee: Secondary | ICD-10-CM

## 2013-10-23 DIAGNOSIS — M7120 Synovial cyst of popliteal space [Baker], unspecified knee: Secondary | ICD-10-CM

## 2013-10-23 DIAGNOSIS — M7989 Other specified soft tissue disorders: Secondary | ICD-10-CM | POA: Insufficient documentation

## 2013-10-23 DIAGNOSIS — M79609 Pain in unspecified limb: Secondary | ICD-10-CM | POA: Insufficient documentation

## 2013-10-23 MED ORDER — MELOXICAM 15 MG PO TABS: 15.0000 mg | ORAL_TABLET | Freq: Every day | ORAL | Status: DC

## 2013-10-23 MED ORDER — HYDROCODONE-ACETAMINOPHEN 5-325 MG PO TABS: ORAL_TABLET | ORAL | Status: DC

## 2013-10-23 NOTE — Progress Notes (Signed)
VASCULAR LAB PRELIMINARY  PRELIMINARY  PRELIMINARY  PRELIMINARY  Right lower extremity venous Doppler completed.    Preliminary report:  There is no DVT or SVT noted in the right lower extremity.  There is a small to moderate sized Baker's Cyst noted in the right popliteal fossa.  Brittnee Gaetano, RVT 10/23/2013, 10:56 AM

## 2013-10-23 NOTE — ED Notes (Signed)
Transported to doppler

## 2013-10-23 NOTE — ED Notes (Signed)
C/o right leg swelling States she does have neuropathy States does use prescribed lyrica Does work 10-12 hour shifts on feet Did elevate foot and use heat as treatment

## 2013-10-23 NOTE — ED Notes (Signed)
Candace from vascular lab called reporting no clot, positive for bakers cyst.  Notified dr Lorenz Coaster of these results

## 2013-10-23 NOTE — ED Provider Notes (Signed)
Chief Complaint:   Chief Complaint  Patient presents with  . Leg Swelling    History of Present Illness:   Mary [name] is a 52 year old female who's had a two-week history of right leg swelling and pain. The swelling and pain are from the knee on down. It is located mostly medially. There is no swelling or pain in the thigh or in the foot and. She does not have much swelling in the calf and denies any pain or fullness in the popliteal fossa. There is no numbness, tingling, or weakness. She denies any injury to the leg. There's been no prior history of DVT or thrombophlebitis. No history of pulmonary embolism. She denies any chest pain or shortness of breath.  Review of Systems:  Other than noted above, the patient denies any of the following symptoms: Systemic:  No fever, chills, sweats, weight gain or loss. Respiratory:  No coughing, wheezing, or shortness of breath. Cardiac:  No chest pain, tightness, pressure or syncope. GI:  No abdominal pain, swelling, distension, nausea, or vomiting. GU:  No dysuria, frequency, or hematuria. Ext:  No joint pain, muscle pain, or weakness. Skin:  No rash or itching. Neuro:  No paresthesias.  PMFSH:  Past medical history, family history, social history, meds, and allergies were reviewed.  She had colon cancer 2 years ago. This was resected and she underwent chemotherapy. This left her with peripheral neuropathy and she takes Lyrica for that.  Physical Exam:   Vital signs:  BP 153/93  Pulse 62  Temp(Src) 98 F (36.7 C) (Oral)  Resp 18  SpO2 100% Gen:  Alert, oriented, in no distress. Neck:  No tenderness, adenopathy, or JVD. Lungs:  Breath sounds clear and equal bilaterally.  No rales, rhonchi or wheezes. Heart:  Regular rhythm, no gallops or murmers. Abdomen:  Soft, nontender, no organomegaly or mass. Ext:  She has pain to palpation over the course of the greater saphenous vein. There is slight swelling just below the knee medially. There is no  calf tenderness. Homans sign is positive. There is no pain to palpation in the popliteal fossa. She has no swelling swelling of the ankle or pitting edema. There no distended blood vessels or palpable cord. Neuro:  Alert and oriented times 3.  No muscle weakness.  Sensation intact to light touch. Skin:  Warm and dry.  No rash or skin lesions.  Radiology:  A venous Doppler showed no evidence of DVT. She does have a popliteal cyst.  Course in Urgent Care Center:  She was placed in a knee sleeve.  Assessment:  The encounter diagnosis was Baker's cyst, right.  No evidence of DVT.  Plan:   1.  Meds:  The following meds were prescribed:   Discharge Medication List as of 10/23/2013 11:33 AM    START taking these medications   Details  HYDROcodone-acetaminophen (NORCO/VICODIN) 5-325 MG per tablet 1 to 2 tabs every 4 to 6 hours as needed for pain., Print    meloxicam (MOBIC) 15 MG tablet Take 1 tablet (15 mg total) by mouth daily., Starting 10/23/2013, Until Discontinued, Normal        2.  Patient Education/Counseling:  The patient was given appropriate handouts, self care instructions, and instructed in symptomatic relief.  Suggested elevation, rest, ice, and compression with a knee sleeve.  3.  Follow up:  The patient was told to follow up if no better in 3 to 4 days, if becoming worse in any way, and given some red  flag symptoms such as worsening pain which would prompt immediate return.  Follow up with Dr. Dion Saucier in 2 weeks if no improvement.     Reuben Likes, MD 10/23/13 2031

## 2013-10-27 ENCOUNTER — Other Ambulatory Visit: Payer: Self-pay | Admitting: *Deleted

## 2013-10-27 ENCOUNTER — Other Ambulatory Visit: Payer: Self-pay | Admitting: Nurse Practitioner

## 2013-10-27 ENCOUNTER — Telehealth: Payer: Self-pay | Admitting: *Deleted

## 2013-10-27 ENCOUNTER — Other Ambulatory Visit: Payer: Self-pay | Admitting: Oncology

## 2013-10-27 ENCOUNTER — Other Ambulatory Visit: Payer: BC Managed Care – PPO

## 2013-10-27 ENCOUNTER — Ambulatory Visit (HOSPITAL_BASED_OUTPATIENT_CLINIC_OR_DEPARTMENT_OTHER): Payer: BC Managed Care – PPO | Admitting: Nurse Practitioner

## 2013-10-27 ENCOUNTER — Telehealth: Payer: Self-pay | Admitting: Oncology

## 2013-10-27 VITALS — BP 137/81 | HR 69 | Temp 97.9°F | Resp 18 | Ht 69.0 in | Wt 236.6 lb

## 2013-10-27 DIAGNOSIS — C189 Malignant neoplasm of colon, unspecified: Secondary | ICD-10-CM

## 2013-10-27 DIAGNOSIS — Z85038 Personal history of other malignant neoplasm of large intestine: Secondary | ICD-10-CM

## 2013-10-27 DIAGNOSIS — Z86718 Personal history of other venous thrombosis and embolism: Secondary | ICD-10-CM

## 2013-10-27 DIAGNOSIS — M7120 Synovial cyst of popliteal space [Baker], unspecified knee: Secondary | ICD-10-CM

## 2013-10-27 DIAGNOSIS — G620 Drug-induced polyneuropathy: Secondary | ICD-10-CM

## 2013-10-27 DIAGNOSIS — D509 Iron deficiency anemia, unspecified: Secondary | ICD-10-CM

## 2013-10-27 LAB — CEA: CEA: 1.5 ng/mL (ref 0.0–5.0)

## 2013-10-27 MED ORDER — DULOXETINE HCL 30 MG PO CPEP: 30.0000 mg | ORAL_CAPSULE | Freq: Every day | ORAL | Status: DC
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
## 2013-10-27 NOTE — Telephone Encounter (Signed)
Called Dr. Charna Elizabeth office to have patient's colonoscopy from 6 moths ago to be faxed to our office.  Per Ebbie Ridge.  Maisie Fus, NP.

## 2013-10-27 NOTE — Telephone Encounter (Signed)
gv andp rinted appt sched and avs for pt for June 2015.Marland KitchenMarland Kitchenpt wants water based

## 2013-10-28 ENCOUNTER — Telehealth: Payer: Self-pay | Admitting: *Deleted

## 2013-10-28 NOTE — Telephone Encounter (Signed)
Message copied by Caleb Popp on Fri Oct 28, 2013  9:03 AM ------      Message from: Thornton Papas B      Created: Thu Oct 27, 2013  7:46 PM       Please call patient, cea is normal, f/u as scheduled ------

## 2013-10-28 NOTE — Telephone Encounter (Signed)
Left message for patient to call back regarding lab results.

## 2013-10-31 ENCOUNTER — Telehealth: Payer: Self-pay | Admitting: *Deleted

## 2013-10-31 NOTE — Telephone Encounter (Signed)
Message copied by Raphael Gibney on Mon Oct 31, 2013  1:12 PM ------      Message from: Thornton Papas B      Created: Thu Oct 27, 2013  7:46 PM       Please call patient, cea is normal, f/u as scheduled ------

## 2013-11-01 NOTE — Telephone Encounter (Signed)
Called and informed patient of normal cea, and to follow up as scheduled.  Per Dr. Truett Perna.  Patient verbalized understanding

## 2013-12-19 ENCOUNTER — Encounter: Payer: Self-pay | Admitting: Oncology

## 2014-02-18 ENCOUNTER — Encounter (HOSPITAL_COMMUNITY): Payer: Self-pay | Admitting: Emergency Medicine

## 2014-02-18 ENCOUNTER — Emergency Department (INDEPENDENT_AMBULATORY_CARE_PROVIDER_SITE_OTHER)
Admission: EM | Admit: 2014-02-18 | Discharge: 2014-02-18 | Disposition: A | Discharge status: Home / Self Care | Payer: BC Managed Care – PPO | Source: Home / Self Care | Attending: Family Medicine | Admitting: Family Medicine

## 2014-02-18 DIAGNOSIS — L03114 Cellulitis of left upper limb: Secondary | ICD-10-CM

## 2014-02-18 DIAGNOSIS — M79603 Pain in arm, unspecified: Secondary | ICD-10-CM

## 2014-02-18 DIAGNOSIS — IMO0002 Reserved for concepts with insufficient information to code with codable children: Secondary | ICD-10-CM

## 2014-02-18 DIAGNOSIS — M79609 Pain in unspecified limb: Secondary | ICD-10-CM

## 2014-02-18 MED ORDER — LIDOCAINE HCL (PF) 1 % IJ SOLN
INTRAMUSCULAR | Status: AC
  Filled 2014-02-18: qty 5

## 2014-02-18 MED ORDER — OXYCODONE-ACETAMINOPHEN 5-325 MG PO TABS: 2.0000 | ORAL_TABLET | ORAL | Status: DC | PRN

## 2014-02-18 MED ORDER — CEFTRIAXONE SODIUM 1 G IJ SOLR
1.0000 g | Freq: Once | INTRAMUSCULAR | Status: AC
  Administered 2014-02-18: 1 g via INTRAMUSCULAR

## 2014-02-18 MED ORDER — DOXYCYCLINE HYCLATE 100 MG PO CAPS: 100.0000 mg | ORAL_CAPSULE | Freq: Two times a day (BID) | ORAL | Status: DC

## 2014-02-18 MED ORDER — CEFTRIAXONE SODIUM 1 G IJ SOLR
INTRAMUSCULAR | Status: AC
  Filled 2014-02-18: qty 10

## 2014-02-18 NOTE — ED Provider Notes (Signed)
CSN: [id number]     Arrival date & time 02/18/14  1718 History   First MD Initiated Contact with Patient 02/18/14 1748     Chief Complaint  Patient presents with  . Cellulitis   (Consider location/radiation/quality/duration/timing/severity/associated sxs/prior Treatment) HPI Comments: Patient presents with a <24 pain and redness in the right arm. Woke up this am to itching on the left elbow. Noticed something that resembled a "bit mark". As the day has progressed the redness, pain and warmth have increased. Low grade temps and chills. No joint pain.   The history is provided by the patient.    Past Medical History  Diagnosis Date  . Blood transfusion   . History of migraine headaches   . iron deficiency anemia   . Adenocarcinoma sigmoid colon 05/12/11    Stage III(pT2pN2b) descending sigmoid  . Multiple hemangiomas     Liver  . Renal cysts, acquired, bilateral   . Pneumothorax 07/19/11    Bilateral  . Arm DVT (deep venous thromboembolism), acute 07/04/11    r/t PAC  . 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 GERD (gastroesophageal reflux disease)   . Incisional hernia    Past Surgical History  Procedure Laterality Date  . Abdominal hysterectomy    . Colon surgery  05/17/11    Partial colectomy  . Portacath placement  07/20/11    Right side placed  . Portacath placement      left side & right side  . Port-a-cath removal  05/06/2012    Procedure: MINOR REMOVAL PORT-A-CATH;  Surgeon: Gwenyth Ober, MD;  Location: [place];  Service: General;  Laterality: Right;  . Ventral hernia repair  09/06/2012    Procedure: LAPAROSCOPIC VENTRAL HERNIA;  Surgeon: Gwenyth Ober, MD;  Location: [facility];  Service: General;  Laterality: N/A;  . Insertion of mesh  09/06/2012    Procedure: INSERTION OF MESH;  Surgeon: Gwenyth Ober, MD;  Location: [place];  Service: General;  Laterality: N/A;  . Hernia repair  09/06/12    lap  incisional hernia   No family history on file. History  Substance Use Topics  . Smoking status: Never Smoker   . Smokeless tobacco: Never Used  . Alcohol Use: No   OB History   Grav Para Term Preterm Abortions TAB SAB Ect Mult Living                 Review of Systems  All other systems reviewed and are negative.   Allergies  Review of patient's allergies indicates no known allergies.  Home Medications   Current Outpatient Rx  Name  Route  Sig  Dispense  Refill  . doxycycline (VIBRAMYCIN) 100 MG capsule   Oral   Take 1 capsule (100 mg total) by mouth 2 (two) times daily.   20 capsule   0   . DULoxetine (CYMBALTA) 30 MG capsule   Oral   Take 1 capsule (30 mg total) by mouth daily.   30 capsule   0   . HYDROcodone-acetaminophen (NORCO/VICODIN) 5-325 MG per tablet      1 to 2 tabs every 4 to 6 hours as needed for pain.   20 tablet   0   . IRON PO   Oral   Take 325 mg by mouth 2 (two) times daily.          Marland Kitchen LYRICA 100 MG capsule      TAKE  ONE CAPSULE BY MOUTH THREE TIMES DAILY   90 capsule   3   . meloxicam (MOBIC) 15 MG tablet   Oral   Take 1 tablet (15 mg total) by mouth daily.   15 tablet   0   . oxyCODONE-acetaminophen (PERCOCET/ROXICET) 5-325 MG per tablet   Oral   Take 2 tablets by mouth every 4 (four) hours as needed for severe pain.   20 tablet   0   . polyethylene glycol (MIRALAX / GLYCOLAX) packet   Oral   Take 17 g by mouth daily.          BP 154/94  Pulse 78  Temp(Src) 99.1 F (37.3 C) (Oral)  Resp 16  Ht 5\' 11"  (1.803 m)  Wt 195 lb (88.451 kg)  BMI 27.21 kg/m2  SpO2 98% Physical Exam  Nursing note and vitals reviewed. Constitutional: She is oriented to person, place, and time. She appears well-developed and well-nourished. No distress.  Pulmonary/Chest: Effort normal.  Musculoskeletal:  Full ROM in the left elbow, without synovitis or effusion noted  Neurological: She is alert and oriented to person, place, and time. No  cranial nerve deficit.  Skin: Skin is warm and dry. There is erythema.     12 x 15cm erythematous are on left forearm. Warm to touch. Swollen, no open wounds.   Psychiatric: Her behavior is normal.    ED Course  Procedures (including critical care time) Labs Review Labs Reviewed - No data to display Imaging Review No results found.   MDM   1. Cellulitis of arm, left   2. Arm pain    Rocephin, Doxy, Education. F/U in 2 days.    Bjorn Pippin, PA-C 02/18/14 1826

## 2014-02-18 NOTE — ED Notes (Signed)
Pt reports poss insect bite onset last night to right arm/forearm?? Woke up this am w/redness, swelling, localized fever Reports redness and swelling is spreading Alert w/no signs of acute distress.

## 2014-02-18 NOTE — Discharge Instructions (Signed)
Cellulitis Cellulitis is an infection of the skin and the tissue under the skin. The infected area is usually red and tender. This happens most often in the arms and lower legs. HOME CARE   Take your antibiotic medicine as told. Finish the medicine even if you start to feel better.  Keep the infected arm or leg raised (elevated).  Put a warm cloth on the area up to 4 times per day.  Only take medicines as told by your doctor.  Keep all doctor visits as told. GET HELP RIGHT AWAY IF:   You have a fever.  You feel very sleepy.  You throw up (vomit) or have watery poop (diarrhea).  You feel sick and have muscle aches and pains.  You see red streaks on the skin coming from the infected area.  Your red area gets bigger or turns a dark color.  Your bone or joint under the infected area is painful after the skin heals.  Your infection comes back in the same area or different area.  You have a puffy (swollen) bump in the infected area.  You have new symptoms. MAKE SURE YOU:   Understand these instructions.  Will watch your condition.  Will get help right away if you are not doing well or get worse. Document Released: 04/14/2008 Document Revised: 04/27/2012 Document Reviewed: 01/12/2012 [facility] Patient Information 2014 [place], [place].  Good to see you. IF the redness or swelling worsens or worsening fevers, or N,V.  Please do not hesitate to go to the ER for an evaluation.

## 2014-02-18 NOTE — ED Provider Notes (Signed)
Medical screening examination/treatment/procedure(s) were performed by a resident physician or non-physician practitioner and as the supervising physician I was immediately available for consultation/collaboration.  Lynne Leader, MD    Gregor Hams, MD 02/18/14 2030

## 2014-02-19 ENCOUNTER — Inpatient Hospital Stay (HOSPITAL_COMMUNITY): Payer: BC Managed Care – PPO

## 2014-02-19 ENCOUNTER — Inpatient Hospital Stay (HOSPITAL_COMMUNITY)
Admission: EM | Admit: 2014-02-19 | Discharge: 2014-02-21 | DRG: 603 | Disposition: A | Discharge status: Home / Self Care | Payer: BC Managed Care – PPO | Attending: Internal Medicine | Admitting: Internal Medicine

## 2014-02-19 ENCOUNTER — Encounter (HOSPITAL_COMMUNITY): Payer: Self-pay | Admitting: Emergency Medicine

## 2014-02-19 DIAGNOSIS — K219 Gastro-esophageal reflux disease without esophagitis: Secondary | ICD-10-CM | POA: Diagnosis present

## 2014-02-19 DIAGNOSIS — Z79899 Other long term (current) drug therapy: Secondary | ICD-10-CM

## 2014-02-19 DIAGNOSIS — C189 Malignant neoplasm of colon, unspecified: Secondary | ICD-10-CM

## 2014-02-19 DIAGNOSIS — Z86718 Personal history of other venous thrombosis and embolism: Secondary | ICD-10-CM

## 2014-02-19 DIAGNOSIS — Z9049 Acquired absence of other specified parts of digestive tract: Secondary | ICD-10-CM

## 2014-02-19 DIAGNOSIS — IMO0002 Reserved for concepts with insufficient information to code with codable children: Secondary | ICD-10-CM

## 2014-02-19 DIAGNOSIS — D509 Iron deficiency anemia, unspecified: Secondary | ICD-10-CM

## 2014-02-19 DIAGNOSIS — I82409 Acute embolism and thrombosis of unspecified deep veins of unspecified lower extremity: Secondary | ICD-10-CM | POA: Diagnosis present

## 2014-02-19 DIAGNOSIS — L0390 Cellulitis, unspecified: Secondary | ICD-10-CM

## 2014-02-19 DIAGNOSIS — L03113 Cellulitis of right upper limb: Secondary | ICD-10-CM | POA: Diagnosis present

## 2014-02-19 DIAGNOSIS — Z85038 Personal history of other malignant neoplasm of large intestine: Secondary | ICD-10-CM

## 2014-02-19 LAB — CBC WITH DIFFERENTIAL/PLATELET
Basophils Absolute: 0 10*3/uL (ref 0.0–0.1)
Basophils Relative: 0 % (ref 0–1)
Eosinophils Absolute: 0.2 10*3/uL (ref 0.0–0.7)
Eosinophils Relative: 2 % (ref 0–5)
HCT: 37.3 % (ref 36.0–46.0)
Hemoglobin: 12.3 g/dL (ref 12.0–15.0)
Lymphocytes Relative: 24 % (ref 12–46)
Lymphs Abs: 2.1 10*3/uL (ref 0.7–4.0)
MCH: 23.1 pg — ABNORMAL LOW (ref 26.0–34.0)
MCHC: 33 g/dL (ref 30.0–36.0)
MCV: 70.1 fL — ABNORMAL LOW (ref 78.0–100.0)
Monocytes Absolute: 0.5 10*3/uL (ref 0.1–1.0)
Monocytes Relative: 6 % (ref 3–12)
Neutro Abs: 5.8 10*3/uL (ref 1.7–7.7)
Neutrophils Relative %: 68 % (ref 43–77)
Platelets: 213 10*3/uL (ref 150–400)
RBC: 5.32 MIL/uL — ABNORMAL HIGH (ref 3.87–5.11)
RDW: 16.8 % — ABNORMAL HIGH (ref 11.5–15.5)
WBC: 8.5 10*3/uL (ref 4.0–10.5)

## 2014-02-19 LAB — BASIC METABOLIC PANEL
BUN: 16 mg/dL (ref 6–23)
CO2: 24 mEq/L (ref 19–32)
Calcium: 9.4 mg/dL (ref 8.4–10.5)
Chloride: 102 mEq/L (ref 96–112)
Creatinine, Ser: 1.1 mg/dL (ref 0.50–1.10)
GFR calc Af Amer: 66 mL/min — ABNORMAL LOW (ref >90)
GFR calc non Af Amer: 57 mL/min — ABNORMAL LOW (ref >90)
Glucose, Bld: 94 mg/dL (ref 70–99)
Potassium: 3.6 mEq/L — ABNORMAL LOW (ref 3.7–5.3)
Sodium: 140 mEq/L (ref 137–147)

## 2014-02-19 LAB — D-DIMER, QUANTITATIVE: D-Dimer, Quant: 0.66 ug/mL-FEU — ABNORMAL HIGH (ref 0.00–0.48)

## 2014-02-19 MED ORDER — PIPERACILLIN-TAZOBACTAM 3.375 G IVPB 30 MIN
3.3750 g | Freq: Once | INTRAVENOUS | Status: AC
  Administered 2014-02-19: 3.375 g via INTRAVENOUS
  Filled 2014-02-19: qty 50

## 2014-02-19 MED ORDER — DIPHENHYDRAMINE HCL 50 MG/ML IJ SOLN
12.5000 mg | Freq: Once | INTRAMUSCULAR | Status: AC
  Administered 2014-02-19: 12.5 mg via INTRAVENOUS
  Filled 2014-02-19: qty 1

## 2014-02-19 MED ORDER — ENOXAPARIN SODIUM 120 MG/0.8ML ~~LOC~~ SOLN
110.0000 mg | Freq: Two times a day (BID) | SUBCUTANEOUS | Status: DC
  Administered 2014-02-20: 110 mg via SUBCUTANEOUS
  Filled 2014-02-19 (×2): qty 0.8

## 2014-02-19 MED ORDER — VANCOMYCIN HCL 10 G IV SOLR
2000.0000 mg | Freq: Once | INTRAVENOUS | Status: AC
  Administered 2014-02-20: 2000 mg via INTRAVENOUS
  Filled 2014-02-19: qty 2000

## 2014-02-19 MED ORDER — DIPHENHYDRAMINE HCL 50 MG/ML IJ SOLN
12.5000 mg | Freq: Once | INTRAMUSCULAR | Status: AC
  Administered 2014-02-19: 12.5 mg via INTRAVENOUS
  Filled 2014-02-19 (×2): qty 1

## 2014-02-19 MED ORDER — ONDANSETRON HCL 4 MG/2ML IJ SOLN: 4.0000 mg | Freq: Four times a day (QID) | INTRAMUSCULAR | Status: DC | PRN

## 2014-02-19 MED ORDER — VANCOMYCIN HCL IN DEXTROSE 1-5 GM/200ML-% IV SOLN
1000.0000 mg | Freq: Two times a day (BID) | INTRAVENOUS | Status: DC
  Administered 2014-02-20 – 2014-02-21 (×2): 1000 mg via INTRAVENOUS
  Filled 2014-02-19 (×3): qty 200

## 2014-02-19 MED ORDER — ENOXAPARIN SODIUM 120 MG/0.8ML ~~LOC~~ SOLN
110.0000 mg | SUBCUTANEOUS | Status: AC
  Administered 2014-02-19: 110 mg via SUBCUTANEOUS
  Filled 2014-02-19: qty 0.8

## 2014-02-19 MED ORDER — DOCUSATE SODIUM 100 MG PO CAPS
100.0000 mg | ORAL_CAPSULE | Freq: Two times a day (BID) | ORAL | Status: DC
Start: 2014-02-19 — End: 2014-02-21
  Administered 2014-02-20 – 2014-02-21 (×3): 100 mg via ORAL
  Filled 2014-02-19 (×5): qty 1

## 2014-02-19 MED ORDER — ACETAMINOPHEN 325 MG PO TABS
650.0000 mg | ORAL_TABLET | Freq: Four times a day (QID) | ORAL | Status: DC | PRN
  Administered 2014-02-20: 650 mg via ORAL
  Filled 2014-02-19: qty 2

## 2014-02-19 MED ORDER — MORPHINE SULFATE 4 MG/ML IJ SOLN
4.0000 mg | Freq: Once | INTRAMUSCULAR | Status: AC
  Administered 2014-02-19: 4 mg via INTRAVENOUS
  Filled 2014-02-19 (×2): qty 1

## 2014-02-19 MED ORDER — SODIUM CHLORIDE 0.9 % IJ SOLN
3.0000 mL | Freq: Two times a day (BID) | INTRAMUSCULAR | Status: DC
  Administered 2014-02-20 – 2014-02-21 (×4): 3 mL via INTRAVENOUS

## 2014-02-19 MED ORDER — HYDROXYZINE HCL 25 MG PO TABS
25.0000 mg | ORAL_TABLET | Freq: Three times a day (TID) | ORAL | Status: DC | PRN
  Administered 2014-02-20 (×3): 25 mg via ORAL
  Filled 2014-02-19 (×3): qty 1

## 2014-02-19 MED ORDER — ONDANSETRON HCL 4 MG PO TABS: 4.0000 mg | ORAL_TABLET | Freq: Four times a day (QID) | ORAL | Status: DC | PRN

## 2014-02-19 MED ORDER — ACETAMINOPHEN 650 MG RE SUPP: 650.0000 mg | Freq: Four times a day (QID) | RECTAL | Status: DC | PRN

## 2014-02-19 MED ORDER — CEFTRIAXONE SODIUM 1 G IJ SOLR: 1.0000 g | Freq: Once | INTRAMUSCULAR | Status: DC

## 2014-02-19 NOTE — Progress Notes (Signed)
ANTIBIOTIC CONSULT NOTE - INITIAL  Pharmacy Consult for vancomycin Indication: cellulitis  No Known Allergies  Patient Measurements: Height: 5\' 11"  (180.3 cm) Weight: 237 lb 12.8 oz (107.865 kg) IBW/kg (Calculated) : 70.8  Vital Signs: Temp: 98.7 F (37.1 C) (04/12 2209) Temp src: Oral (04/12 2209) BP: 149/76 mmHg (04/12 2251) Pulse Rate: 68 (04/12 2251)  Labs:  Recent Labs  02/19/14 1940  WBC 8.5  HGB 12.3  PLT 213  CREATININE 1.10   Estimated Creatinine Clearance: 80.8 ml/min (by C-G formula based on Cr of 1.1).   Microbiology: No results found for this or any previous visit (from the past 720 hour(s)).  Medical History: Past Medical History  Diagnosis Date  . Blood transfusion   . History of migraine headaches   . iron deficiency anemia   . Adenocarcinoma sigmoid colon 05/12/11    Stage III(pT2pN2b) descending sigmoid  . Multiple hemangiomas     Liver  . Renal cysts, acquired, bilateral   . Pneumothorax 07/19/11    Bilateral  . Arm DVT (deep venous thromboembolism), acute 07/04/11    r/t PAC  . 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 GERD (gastroesophageal reflux disease)   . Incisional hernia     Medications:  Prescriptions prior to admission  Medication Sig Dispense Refill  . doxycycline (VIBRAMYCIN) 100 MG capsule Take 1 capsule (100 mg total) by mouth 2 (two) times daily.  20 capsule  0  . oxyCODONE-acetaminophen (PERCOCET/ROXICET) 5-325 MG per tablet Take 2 tablets by mouth every 4 (four) hours as needed for severe pain.  20 tablet  0   Scheduled:  . docusate sodium  100 mg Oral BID  . [START ON 02/20/2014] enoxaparin (LOVENOX) injection  110 mg Subcutaneous Q12H  . sodium chloride  3 mL Intravenous Q12H    Assessment: 53yo female c/o possible insect bite to RUE, Rx'd abx yesterday but c/o worsening pain and erythema, exam c/w cellulitis but has h/o UE DVT, awaiting doppler, to begin IV  ABX.  Goal of Therapy:  Vancomycin trough level 10-15 mcg/ml  Plan:  Rec'd Zosyn in ED; will give vancomycin 2000mg  IV x1 followed by 1000mg  IV Q12H and monitor CBC, Cx, levels prn.  Wynona Neat, PharmD, BCPS  02/19/2014,11:26 PM

## 2014-02-19 NOTE — H&P (Signed)
[facility] History and Physical  Patient: Mary [name]  KAJ:[id number]  DOB: 29-Dec-1960  DOS: the patient was seen and examined on 02/19/2014 PCP: No PCP Per Patient  Chief Complaint: Right arm redness and swelling  HPI: Mary [name] is a 53 y.o. female with Past medical history of colon cancer, DVT. The patient presents with complaints of right arm swelling. She mentions that she 4 days ago she noted right arm redness which is primarily involving her right axillary side of arm. She started having redness and itching there. She also noted a scratch mark on her distal right forearm. Her swelling and redness progressively worsen and therefore she came to the urgent care, where she was given IV ceftriaxone and was at home on oral doxycycline and Percocet. Despite taking this medication her symptoms are progressively worsening and therefore she came to the hospital today. Pt denies any fever, chills, headache, cough, chest pain, palpitation, shortness of breath, orthopnea, PND, nausea, vomiting, abdominal pain, diarrhea, constipation, active bleeding, burning urination, dizziness, pedal edema,  focal neurological deficit.  She denies any trauma, injury, bug bites, using new clothes and all chemicals. She denies any similar episode in the past.  The patient is coming from home. And at her baseline Independent for most of her  ADL.  Review of Systems: as mentioned in the history of present illness.  A Comprehensive review of the other systems is negative.  Past Medical History  Diagnosis Date  . Blood transfusion   . History of migraine headaches   . iron deficiency anemia   . Adenocarcinoma sigmoid colon 05/12/11    Stage III(pT2pN2b) descending sigmoid  . Multiple hemangiomas     Liver  . Renal cysts, acquired, bilateral   . Pneumothorax 07/19/11    Bilateral  . Arm DVT (deep venous thromboembolism), acute 07/04/11    r/t PAC  . Neuropathy due to drug 2012    Early--assoc.  w/prolonged cold sensitivity--Oxaliplatin related  . GERD (gastroesophageal reflux disease)   . Incisional hernia    Past Surgical History  Procedure Laterality Date  . Abdominal hysterectomy    . Colon surgery  05/17/11    Partial colectomy  . Portacath placement  07/20/11    Right side placed  . Portacath placement      left side & right side  . Port-a-cath removal  05/06/2012    Procedure: MINOR REMOVAL PORT-A-CATH;  Surgeon: Gwenyth Ober, MD;  Location: [place];  Service: General;  Laterality: Right;  . Ventral hernia repair  09/06/2012    Procedure: LAPAROSCOPIC VENTRAL HERNIA;  Surgeon: Gwenyth Ober, MD;  Location: [place];  Service: General;  Laterality: N/A;  . Insertion of mesh  09/06/2012    Procedure: INSERTION OF MESH;  Surgeon: Gwenyth Ober, MD;  Location: [place];  Service: General;  Laterality: N/A;  . Hernia repair  09/06/12    lap incisional hernia   Social History:  reports that she has never smoked. She has never used smokeless tobacco. She reports that she does not drink alcohol or use illicit drugs.  No Known Allergies  No family history on file.  Prior to Admission medications   Medication Sig Start Date End Date Taking? Authorizing Provider  doxycycline (VIBRAMYCIN) 100 MG capsule Take 1 capsule (100 mg total) by mouth 2 (two) times daily. 02/18/14  Yes Bjorn Pippin, PA-C  oxyCODONE-acetaminophen (PERCOCET/ROXICET) 5-325 MG per tablet Take 2 tablets by mouth every 4 (four) hours  as needed for severe pain. 02/18/14  Yes Bjorn Pippin, PA-C    Physical Exam: Filed Vitals:   02/19/14 1930 02/19/14 2100 02/19/14 2209 02/19/14 2251  BP: 114/58   149/76  Pulse: 75 70  68  Temp:   98.7 F (37.1 C)   TempSrc:   Oral   Resp:    20  Height:      Weight:      SpO2: 97% 100%  100%    General: Alert, Awake and Oriented to Time, Place and Person. Appear in mild distress Eyes: PERRL ENT: Oral Mucosa clear moist. Neck: no  JVD Cardiovascular: S1 and S2 Present, no Murmur, Peripheral Pulses Present Respiratory: Bilateral Air entry equal and Decreased, Clear to Auscultation,  no Crackles,no wheezes Abdomen: Bowel Sound Present, Soft and Non tender Skin: no Rash Extremities: Bilateral right more than left Pedal edema, no calf tenderness, right upper extremity swollen as compared to the left Redness and warmth, specific swelling in the right arm. Sensation is intact strength intact distal pulses palpable.  Neurologic: Grossly Unremarkable.  Labs on Admission:  CBC:  Recent Labs Lab 02/19/14 1940  WBC 8.5  NEUTROABS 5.8  HGB 12.3  HCT 37.3  MCV 70.1*  PLT 213    CMP     Component Value Date/Time   NA 140 02/19/2014 1940   NA 140 04/21/2013 0815   K 3.6* 02/19/2014 1940   K 3.8 04/21/2013 0815   CL 102 02/19/2014 1940   CL 108* 04/21/2013 0815   CO2 24 02/19/2014 1940   CO2 25 04/21/2013 0815   GLUCOSE 94 02/19/2014 1940   GLUCOSE 113* 04/21/2013 0815   BUN 16 02/19/2014 1940   BUN 14.8 04/21/2013 0815   CREATININE 1.10 02/19/2014 1940   CREATININE 1.1 04/21/2013 0815   CALCIUM 9.4 02/19/2014 1940   CALCIUM 9.2 04/21/2013 0815   PROT 7.3 04/21/2013 0815   PROT 6.9 03/18/2012 1438   ALBUMIN 3.5 04/21/2013 0815   ALBUMIN 4.0 03/18/2012 1438   AST 16 04/21/2013 0815   AST 16 03/18/2012 1438   ALT 15 04/21/2013 0815   ALT 14 03/18/2012 1438   ALKPHOS 101 04/21/2013 0815   ALKPHOS 91 03/18/2012 1438   BILITOT 0.38 04/21/2013 0815   BILITOT 0.3 03/18/2012 1438   GFRNONAA 57* 02/19/2014 1940   GFRAA 66* 02/19/2014 1940    No results found for this basename: LIPASE, AMYLASE,  in the last 168 hours No results found for this basename: AMMONIA,  in the last 168 hours  No results found for this basename: CKTOTAL, CKMB, CKMBINDEX, TROPONINI,  in the last 168 hours BNP (last 3 results) No results found for this basename: PROBNP,  in the last 8760 hours  Radiological Exams on Admission: No results  found.   Assessment/Plan Principal Problem:   Right arm cellulitis Active Problems:   Colon cancer   history of DVT (deep venous thrombosis)   1. Right arm cellulitis The patient is presenting with complaints of right hand redness and swelling. X-ray does not show any evidence of acute abnormality or involvement of the elbow joint. With progressively worsening symptoms she will be admitted to the hospital for IV antibiotics. I would start her on IV vancomycin. Blood cultures will be done. We will also obtain a localized ultrasound of the right upper extremity in the morning.  2. History of colon cancer History of DVT Possible right-sided DVT The patient is presenting with right upper extremity swelling and redness. Patient  history of cancer she could also have possibly right-sided upper extremity DVT. She also has a recent knee arthroscope he following which she has developed more swelling on her right knee as compared to the left. With this I will get Doppler ultrasound of lower extremity and right upper extremity to rule out a DVT. ED has started the patient on empiric Lovenox and patient has received one dose at present. I would continue the patient on one more dose until ultrasound results are back.  DVT Prophylaxis: subcutaneous Heparin Nutrition: Cardiac diet  Code Status: Full  Disposition: Admitted to inpatient in telemetry unit.  Author: Berle Mull, MD [facility] Pager: [phone] 02/19/2014, 10:53 PM    If 7PM-7AM, please contact night-coverage [email or web address] Password [email or web address]

## 2014-02-19 NOTE — ED Notes (Signed)
Attempted IV L medial FA, unsuccessful.

## 2014-02-19 NOTE — ED Notes (Signed)
Admitting MD Dr. Patel at [facility].  

## 2014-02-19 NOTE — ED Provider Notes (Addendum)
CSN: [id number]     Arrival date & time 02/19/14  1629 History   First MD Initiated Contact with Patient 02/19/14 1858     Chief Complaint  Patient presents with  . Arm Problem   The history is provided by the patient.  Pt started with redness and swelling in her right arm a couple of days ago.  Pt denies any recent injuries.  No skin lesions.  She went to an urgent care and was started on abx.  Today her symptoms are worse.  She has taken 1 dose of doxycycline so far.  No fevers.  No vomiting or diarrhea.  No CP or shortness of breath.  She does have a history of dvt in her left arm when she was receiving treatment for colon ca.  Past Medical History  Diagnosis Date  . Blood transfusion   . History of migraine headaches   . iron deficiency anemia   . Adenocarcinoma sigmoid colon 05/12/11    Stage III(pT2pN2b) descending sigmoid  . Multiple hemangiomas     Liver  . Renal cysts, acquired, bilateral   . Pneumothorax 07/19/11    Bilateral  . Arm DVT (deep venous thromboembolism), acute 07/04/11    r/t PAC  . 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 GERD (gastroesophageal reflux disease)   . Incisional hernia    Past Surgical History  Procedure Laterality Date  . Abdominal hysterectomy    . Colon surgery  05/17/11    Partial colectomy  . Portacath placement  07/20/11    Right side placed  . Portacath placement      left side & right side  . Port-a-cath removal  05/06/2012    Procedure: MINOR REMOVAL PORT-A-CATH;  Surgeon: Gwenyth Ober, MD;  Location: [place];  Service: General;  Laterality: Right;  . Ventral hernia repair  09/06/2012    Procedure: LAPAROSCOPIC VENTRAL HERNIA;  Surgeon: Gwenyth Ober, MD;  Location: [place];  Service: General;  Laterality: N/A;  . Insertion of mesh  09/06/2012    Procedure: INSERTION OF MESH;  Surgeon: Gwenyth Ober, MD;  Location: [place];  Service: General;  Laterality: N/A;  .  Hernia repair  09/06/12    lap incisional hernia   No family history on file. History  Substance Use Topics  . Smoking status: Never Smoker   . Smokeless tobacco: Never Used  . Alcohol Use: No   OB History   Grav Para Term Preterm Abortions TAB SAB Ect Mult Living                 Review of Systems  All other systems reviewed and are negative.     Allergies  Review of patient's allergies indicates no known allergies.  Home Medications   Current Outpatient Rx  Name  Route  Sig  Dispense  Refill  . doxycycline (VIBRAMYCIN) 100 MG capsule   Oral   Take 1 capsule (100 mg total) by mouth 2 (two) times daily.   20 capsule   0   . oxyCODONE-acetaminophen (PERCOCET/ROXICET) 5-325 MG per tablet   Oral   Take 2 tablets by mouth every 4 (four) hours as needed for severe pain.   20 tablet   0    BP 138/94  Pulse 77  Temp(Src) 98.7 F (37.1 C) (Oral)  Resp 18  Ht 5\' 11"  (1.803 m)  Wt 240 lb 1.6 oz (108.909 kg)  BMI  33.50 kg/m2  SpO2 98% Physical Exam  Nursing note and vitals reviewed. Constitutional: She appears well-developed and well-nourished. No distress.  HENT:  Head: Normocephalic and atraumatic.  Right Ear: External ear normal.  Left Ear: External ear normal.  Eyes: Conjunctivae are normal. Right eye exhibits no discharge. Left eye exhibits no discharge. No scleral icterus.  Neck: Neck supple. No tracheal deviation present.  Cardiovascular: Normal rate, regular rhythm and intact distal pulses.   Pulmonary/Chest: Effort normal and breath sounds normal. No stridor. No respiratory distress. She has no wheezes. She has no rales.  Abdominal: Soft. Bowel sounds are normal. She exhibits no distension. There is no tenderness. There is no rebound and no guarding.  Musculoskeletal: She exhibits edema and tenderness.  Edema and erythema right forearm and upper arm, full range of motion elbow, small abrasion noted lower right forearm  Neurological: She is alert. She has  normal strength. No cranial nerve deficit (no facial droop, extraocular movements intact, no slurred speech) or sensory deficit. She exhibits normal muscle tone. She displays no seizure activity. Coordination normal.  Skin: Skin is warm and dry. No rash noted.  Psychiatric: She has a normal mood and affect.    ED Course  Procedures (including critical care time) Labs Review Labs Reviewed  CBC WITH DIFFERENTIAL - Abnormal; Notable for the following:    RBC 5.32 (*)    MCV 70.1 (*)    MCH 23.1 (*)    RDW 16.8 (*)    All other components within normal limits  BASIC METABOLIC PANEL - Abnormal; Notable for the following:    Potassium 3.6 (*)    GFR calc non Af Amer 57 (*)    GFR calc Af Amer 66 (*)    All other components within normal limits  D-DIMER, QUANTITATIVE - Abnormal; Notable for the following:    D-Dimer, Quant 0.66 (*)    All other components within normal limits    MDM   Final diagnoses:  Cellulitis    Pt is being treated for cellulitis.  No WBC count but exam is consistent with cellulitis.  She has been treated with an injection of abx and oral medications without improvement.  Will consult with medicine regarding admission.  Will give dose of lovenox and consider doppler in am.  Pt does have history of prior ue dvt    Kathalene Frames, MD 02/19/14 2116  Difficulty obtaining IV access.  IV team has been called.  Will give dose of im rocephin  Kathalene Frames, MD 02/19/14 2128

## 2014-02-19 NOTE — ED Notes (Signed)
Unsuccessful IV attempt by [place] L upper FA, infiltrated, tender, pink, swollen. EDP aware. IV team paged.

## 2014-02-19 NOTE — ED Notes (Signed)
No changes, alert, NAD, calm, interactive, Dr. Posey Pronto finished at [facility], xray ordered, pt up to floor with RN on monitor, up to floor by way of xray.

## 2014-02-19 NOTE — ED Notes (Signed)
No changes. Pt alert, NAD, calm, interactive, family at [facility], up to b/r.

## 2014-02-19 NOTE — Progress Notes (Signed)
ANTICOAGULATION CONSULT NOTE - Initial Consult  Pharmacy Consult:  Lovenox Indication:  VTE treatment  No Known Allergies  Patient Measurements: Height: 5\' 11"  (180.3 cm) Weight: 240 lb 1.6 oz (108.909 kg) IBW/kg (Calculated) : 70.8  Vital Signs: Temp: 98.7 F (37.1 C) (04/12 1636) Temp src: Oral (04/12 1636) BP: 138/94 mmHg (04/12 1858) Pulse Rate: 77 (04/12 1858)  Labs:  Recent Labs  02/19/14 1940  HGB 12.3  HCT 37.3  PLT 213  CREATININE 1.10    Estimated Creatinine Clearance: 81.2 ml/min (by C-G formula based on Cr of 1.1).   Medical History: Past Medical History  Diagnosis Date  . Blood transfusion   . History of migraine headaches   . iron deficiency anemia   . Adenocarcinoma sigmoid colon 05/12/11    Stage III(pT2pN2b) descending sigmoid  . Multiple hemangiomas     Liver  . Renal cysts, acquired, bilateral   . Pneumothorax 07/19/11    Bilateral  . Arm DVT (deep venous thromboembolism), acute 07/04/11    r/t PAC  . 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 GERD (gastroesophageal reflux disease)   . Incisional hernia       Assessment: 48 YOF presented [facility]18[facility]14 and was diagnosed with right arm cellulitis.  Patient was given antibiotics and she returned to the ED today with increased redness and swelling.  Pharmacy consulted to start Lovenox for suspected DVT.  Of note, patient has a history of DVT in 2012.  Baseline labs reviewed.   Goal of Therapy:  Anti-Xa level 0.6-1 units/ml 4hrs after LMWH dose given Monitor platelets by anticoagulation protocol: Yes    Plan:  - Lovenox 110mg  SQ Q12H - CBC Q72H while on Lovenox - F/U order for Doppler, KCL supplementation, continuation of abx order if still concerned with cellulitis    Azelea Seguin D. Mina Marble, PharmD, BCPS Pager:  [phone] 02/19/2014, 9:28 PM

## 2014-02-19 NOTE — ED Notes (Addendum)
IV team at [facility]. Pt continues to c/o pain, burning, itching & mild nausea.

## 2014-02-19 NOTE — ED Notes (Signed)
Attempted IV x2, unsuccessful, L AC.

## 2014-02-19 NOTE — ED Notes (Signed)
The  Pt has redness and swelling to the rt arm.  [facility] was [facility] yesterday and diagnosed with cellulitis of thaat arm.  She was given an injection of an antibiotic but today the arm is more swollen painful and the redness has increased.

## 2014-02-20 DIAGNOSIS — I82409 Acute embolism and thrombosis of unspecified deep veins of unspecified lower extremity: Secondary | ICD-10-CM

## 2014-02-20 DIAGNOSIS — IMO0002 Reserved for concepts with insufficient information to code with codable children: Principal | ICD-10-CM

## 2014-02-20 DIAGNOSIS — K219 Gastro-esophageal reflux disease without esophagitis: Secondary | ICD-10-CM

## 2014-02-20 DIAGNOSIS — M7989 Other specified soft tissue disorders: Secondary | ICD-10-CM

## 2014-02-20 LAB — COMPREHENSIVE METABOLIC PANEL
ALT: 12 U/L (ref 0–35)
AST: 16 U/L (ref 0–37)
Albumin: 3.1 g/dL — ABNORMAL LOW (ref 3.5–5.2)
Alkaline Phosphatase: 93 U/L (ref 39–117)
BUN: 16 mg/dL (ref 6–23)
CO2: 19 mEq/L (ref 19–32)
Calcium: 8.8 mg/dL (ref 8.4–10.5)
Chloride: 105 mEq/L (ref 96–112)
Creatinine, Ser: 1.11 mg/dL — ABNORMAL HIGH (ref 0.50–1.10)
GFR calc Af Amer: 65 mL/min — ABNORMAL LOW (ref >90)
GFR calc non Af Amer: 56 mL/min — ABNORMAL LOW (ref >90)
Glucose, Bld: 100 mg/dL — ABNORMAL HIGH (ref 70–99)
Potassium: 3.7 mEq/L (ref 3.7–5.3)
Sodium: 140 mEq/L (ref 137–147)
Total Bilirubin: <0.2 mg/dL — ABNORMAL LOW (ref 0.3–1.2)
Total Protein: 6.5 g/dL (ref 6.0–8.3)

## 2014-02-20 LAB — CBC
HCT: 33.3 % — ABNORMAL LOW (ref 36.0–46.0)
Hemoglobin: 11 g/dL — ABNORMAL LOW (ref 12.0–15.0)
MCH: 23.2 pg — ABNORMAL LOW (ref 26.0–34.0)
MCHC: 33 g/dL (ref 30.0–36.0)
MCV: 70.1 fL — ABNORMAL LOW (ref 78.0–100.0)
Platelets: 153 10*3/uL (ref 150–400)
RBC: 4.75 MIL/uL (ref 3.87–5.11)
RDW: 16.9 % — ABNORMAL HIGH (ref 11.5–15.5)
WBC: 5.8 10*3/uL (ref 4.0–10.5)

## 2014-02-20 LAB — PROTIME-INR
INR: 1.15 (ref 0.00–1.49)
Prothrombin Time: 14.5 seconds (ref 11.6–15.2)

## 2014-02-20 MED ORDER — DIPHENHYDRAMINE HCL 25 MG PO CAPS
25.0000 mg | ORAL_CAPSULE | Freq: Four times a day (QID) | ORAL | Status: DC | PRN
  Administered 2014-02-20 – 2014-02-21 (×2): 25 mg via ORAL
  Filled 2014-02-20 (×2): qty 1

## 2014-02-20 MED ORDER — PANTOPRAZOLE SODIUM 40 MG PO TBEC
40.0000 mg | DELAYED_RELEASE_TABLET | Freq: Every day | ORAL | Status: DC
  Administered 2014-02-20 – 2014-02-21 (×2): 40 mg via ORAL
  Filled 2014-02-20 (×2): qty 1

## 2014-02-20 MED ORDER — TETANUS-DIPHTH-ACELL PERTUSSIS 5-2.5-18.5 LF-MCG/0.5 IM SUSP
0.5000 mL | Freq: Once | INTRAMUSCULAR | Status: AC
  Administered 2014-02-20: 0.5 mL via INTRAMUSCULAR
  Filled 2014-02-20: qty 0.5

## 2014-02-20 MED ORDER — ENOXAPARIN SODIUM 60 MG/0.6ML ~~LOC~~ SOLN
55.0000 mg | SUBCUTANEOUS | Status: DC
  Administered 2014-02-21: 10:00:00 via SUBCUTANEOUS
  Filled 2014-02-20: qty 0.6

## 2014-02-20 MED ORDER — OXYCODONE-ACETAMINOPHEN 5-325 MG PO TABS
1.0000 | ORAL_TABLET | ORAL | Status: DC | PRN
  Administered 2014-02-20 – 2014-02-21 (×6): 1 via ORAL
  Filled 2014-02-20 (×6): qty 1

## 2014-02-20 NOTE — Progress Notes (Signed)
UR Completed Gyselle Matthew Graves-Bigelow, RN,BSN [phone]  

## 2014-02-20 NOTE — Progress Notes (Signed)
[facility] PROGRESS NOTE  Mary [name] WEX:[id number] DOB: [birth year] DOA: 02/19/2014 PCP: No PCP Per Patient  Assessment/Plan: #1 right upper extremity cellulitis Patient presented with worsening right upper extremity cellulitis. Erythema and swelling have improved per patient. Doppler ultrasound pending to rule out DVT. Continue empiric IV vancomycin. Follow.  #2 history of colon cancer/prior history of DVT Stable. Outpatient followup.  #3 gastroesophageal reflux disease PPI.  #4 prophylaxis Lovenox for DVT prophylaxis.  Code Status: Full Family Communication:  Updated patient and family at bedside. Disposition Plan: Home when medically stable.   Consultants:  None  Procedures:  X-ray of the right elbow 02/19/2014    Antibiotics:  IV vancomycin 02/19/2014    HPI/Subjective: Patient states right upper extremity swelling and erythema has improved.  Objective: Filed Vitals:   02/19/14 2319  BP: 138/85  Pulse: 76  Temp: 98.2 F (36.8 C)  Resp: 18    Intake/Output Summary (Last 24 hours) at 02/20/14 1046 Last data filed at 02/20/14 0914  Gross per 24 hour  Intake   1160 ml  Output      0 ml  Net   1160 ml   Filed Weights   02/19/14 1636 02/19/14 2319  Weight: 108.909 kg (240 lb 1.6 oz) 107.865 kg (237 lb 12.8 oz)    Exam:   General:  NAD  Cardiovascular: RRR  Respiratory: CTAB  Abdomen: Soft, nontender, nondistended, positive bowel sounds.  Musculoskeletal: No clubbing cyanosis. Right upper extremity with some erythema, some warmth, decreased swelling per patient.  Data Reviewed: Basic Metabolic Panel:  Recent Labs Lab 02/19/14 1940 02/20/14 0610  NA 140 140  K 3.6* 3.7  CL 102 105  CO2 24 19  GLUCOSE 94 100*  BUN 16 16  CREATININE 1.10 1.11*  CALCIUM 9.4 8.8   Liver Function Tests:  Recent Labs Lab 02/20/14 0610  AST 16  ALT 12  ALKPHOS 93  BILITOT <0.2*  PROT 6.5  ALBUMIN 3.1*   No results found for this  basename: LIPASE, AMYLASE,  in the last 168 hours No results found for this basename: AMMONIA,  in the last 168 hours CBC:  Recent Labs Lab 02/19/14 1940 02/20/14 0610  WBC 8.5 5.8  NEUTROABS 5.8  --   HGB 12.3 11.0*  HCT 37.3 33.3*  MCV 70.1* 70.1*  PLT 213 153   Cardiac Enzymes: No results found for this basename: CKTOTAL, CKMB, CKMBINDEX, TROPONINI,  in the last 168 hours BNP (last 3 results) No results found for this basename: PROBNP,  in the last 8760 hours CBG: No results found for this basename: GLUCAP,  in the last 168 hours  No results found for this or any previous visit (from the past 240 hour(s)).   Studies: Dg Elbow Complete Right  02/19/2014   CLINICAL DATA:  Swelling, pain, redness, suspect cellulitis, evaluate joint involvement  EXAM: RIGHT ELBOW - COMPLETE 3+ VIEW  COMPARISON:  None.  FINDINGS: No displaced fracture or elbow joint effusion. Joint spaces are preserved. No erosions. There is apparent mild diffuse soft tissue swelling about the medial aspect of the elbow. No radiopaque foreign body. No subcutaneous emphysema. No discrete area of osteolysis to suggest osteomyelitis.  IMPRESSION: Soft tissue swelling about the elbow without associated fracture, dislocation or radiopaque foreign body.   Electronically Signed   By: Sandi Mariscal M.D.   On: 02/19/2014 23:24    Scheduled Meds: . docusate sodium  100 mg Oral BID  . enoxaparin (LOVENOX) injection  110 mg  Subcutaneous Q12H  . pantoprazole  40 mg Oral Q0600  . sodium chloride  3 mL Intravenous Q12H  . vancomycin  1,000 mg Intravenous Q12H   Continuous Infusions:   Principal Problem:   Right arm cellulitis Active Problems:   Colon cancer   history of DVT (deep venous thrombosis)   GERD (gastroesophageal reflux disease)    Time spent: 35 minutes    Eugenie Filler M.D. [facility] Pager [phone]. If 7PM-7AM, please contact night-coverage at [email or web address], password [facility] 02/20/2014, 10:46  AM  LOS: 1 day

## 2014-02-20 NOTE — Progress Notes (Signed)
*  Preliminary Results* Right upper extremity venous duplex completed. Right upper extremity is negative for deep and superficial vein thrombosis.  Bilateral lower extremity venous duplex completed. Bilateral lower extremities are negative for deep vein thrombosis. There is a right Baker's cyst. No evidence of a left Baker's cyst.  02/20/2014  Maudry Mayhew, RVT, RDCS, RDMS

## 2014-02-21 DIAGNOSIS — D509 Iron deficiency anemia, unspecified: Secondary | ICD-10-CM

## 2014-02-21 LAB — CBC
HCT: 36.6 % (ref 36.0–46.0)
Hemoglobin: 12 g/dL (ref 12.0–15.0)
MCH: 23.1 pg — ABNORMAL LOW (ref 26.0–34.0)
MCHC: 32.8 g/dL (ref 30.0–36.0)
MCV: 70.5 fL — ABNORMAL LOW (ref 78.0–100.0)
Platelets: 172 10*3/uL (ref 150–400)
RBC: 5.19 MIL/uL — ABNORMAL HIGH (ref 3.87–5.11)
RDW: 17.5 % — ABNORMAL HIGH (ref 11.5–15.5)
WBC: 4.9 10*3/uL (ref 4.0–10.5)

## 2014-02-21 LAB — BASIC METABOLIC PANEL
BUN: 13 mg/dL (ref 6–23)
CO2: 23 mEq/L (ref 19–32)
Calcium: 8.9 mg/dL (ref 8.4–10.5)
Chloride: 106 mEq/L (ref 96–112)
Creatinine, Ser: 1.02 mg/dL (ref 0.50–1.10)
GFR calc Af Amer: 72 mL/min — ABNORMAL LOW (ref >90)
GFR calc non Af Amer: 62 mL/min — ABNORMAL LOW (ref >90)
Glucose, Bld: 90 mg/dL (ref 70–99)
Potassium: 4.1 mEq/L (ref 3.7–5.3)
Sodium: 139 mEq/L (ref 137–147)

## 2014-02-21 MED ORDER — CEPHALEXIN 500 MG PO CAPS
500.0000 mg | ORAL_CAPSULE | Freq: Two times a day (BID) | ORAL | Status: DC
  Administered 2014-02-21: 500 mg via ORAL
  Filled 2014-02-21 (×2): qty 1

## 2014-02-21 MED ORDER — DSS 100 MG PO CAPS: 100.0000 mg | ORAL_CAPSULE | Freq: Two times a day (BID) | ORAL | Status: DC

## 2014-02-21 MED ORDER — OXYCODONE-ACETAMINOPHEN 5-325 MG PO TABS: 1.0000 | ORAL_TABLET | ORAL | Status: DC | PRN

## 2014-02-21 MED ORDER — DOXYCYCLINE HYCLATE 100 MG PO CAPS: 100.0000 mg | ORAL_CAPSULE | Freq: Two times a day (BID) | ORAL | Status: DC

## 2014-02-21 MED ORDER — DOXYCYCLINE HYCLATE 100 MG PO TABS
100.0000 mg | ORAL_TABLET | Freq: Two times a day (BID) | ORAL | Status: DC
  Administered 2014-02-21: 100 mg via ORAL
  Filled 2014-02-21 (×2): qty 1

## 2014-02-21 MED ORDER — CEPHALEXIN 500 MG PO CAPS: 500.0000 mg | ORAL_CAPSULE | Freq: Two times a day (BID) | ORAL | Status: DC

## 2014-02-21 NOTE — Discharge Summary (Signed)
Physician Discharge Summary  Mary [name] VQQ:[id number] DOB: 05/25/1961 DOA: 02/19/2014  PCP: No PCP Per Patient  Admit date: 02/19/2014 Discharge date: 02/21/2014  Time spent: 60 minutes  Recommendations for Outpatient Follow-up:  1. Followup with PCP one week post discharge to followup on right upper extremity cellulitis.  Discharge Diagnoses:  Principal Problem:   Right arm cellulitis Active Problems:   Colon cancer   history of DVT (deep venous thrombosis)   GERD (gastroesophageal reflux disease)   Discharge Condition: Stable and improved  Diet recommendation: Regular  Filed Weights   02/19/14 1636 02/19/14 2319  Weight: 108.909 kg (240 lb 1.6 oz) 107.865 kg (237 lb 12.8 oz)    History of present illness:  Mary [name] is a 53 y.o. female with Past medical history of colon cancer, DVT.  The patient presents with complaints of right arm swelling. She mentions that she 4 days ago she noted right arm redness which is primarily involving her right axillary side of arm. She started having redness and itching there. She also noted a scratch mark on her distal right forearm. Her swelling and redness progressively worsen and therefore she came to the urgent care, where she was given IV ceftriaxone and was at home on oral doxycycline and Percocet. Despite taking this medication her symptoms are progressively worsening and therefore she came to the hospital today.  Pt denies any fever, chills, headache, cough, chest pain, palpitation, shortness of breath, orthopnea, PND, nausea, vomiting, abdominal pain, diarrhea, constipation, active bleeding, burning urination, dizziness, pedal edema, focal neurological deficit.  She denies any trauma, injury, bug bites, using new clothes and all chemicals.  She denies any similar episode in the past.  The patient is coming from home. And at her baseline Independent for most of her ADL   Hospital Course:  #1 right upper extremity cellulitis   Patient presented with worsening right upper extremity cellulitis. Patient was seen at urgent care and had just taken one dose of oral doxycycline which he felt cellulitis was worsening and presented to the ED. Patient was admitted and placed on IV vancomycin. Right upper extremity Dopplers were obtained which were negative for DVT. Patient improved clinically he'll be discharged home on 6 more days of oral doxycycline and Keflex. Patient is to followup with PCP as outpatient.  #2 history of colon cancer/prior history of DVT  Stable. Outpatient followup.  #3 gastroesophageal reflux disease  PPI.   Procedures:  Right upper extremity venous Dopplers 02/20/2014  Bilateral lower extremity venous Dopplers 02/20/2014  Consultations:  None  Discharge Exam: Filed Vitals:   02/21/14 0500  BP: 131/73  Pulse: 57  Temp: 98 F (36.7 C)  Resp: 12    General: NAD Cardiovascular: RRR Respiratory: CTAB  Discharge Instructions You were cared for by a hospitalist during your hospital stay. If you have any questions about your discharge medications or the care you received while you were in the hospital after you are discharged, you can call the unit and asked to speak with the hospitalist on call if the hospitalist that took care of you is not available. Once you are discharged, your primary care physician will handle any further medical issues. Please note that NO REFILLS for any discharge medications will be authorized once you are discharged, as it is imperative that you return to your primary care physician (or establish a relationship with a primary care physician if you do not have one) for your aftercare needs so that they can reassess  your need for medications and monitor your lab values.  Discharge Orders   Future Appointments Provider Department Dept Phone   04/24/2014 8:00 AM [facility] [phone]   04/24/2014 9:00 AM [facility] [place]  [facility] [phone]   Liquids only 4 hours prior to your exam. Any medications can be taken as usual. Please arrive 15 min prior to your scheduled exam time.   05/01/2014 8:30 AM Ladell Pier, MD [facility] [phone]   Future Orders Complete By Expires   Diet general  As directed    Discharge instructions  As directed    Increase activity slowly  As directed        Medication List         cephALEXin 500 MG capsule  Commonly known as:  KEFLEX  Take 1 capsule (500 mg total) by mouth every 12 (twelve) hours. Take for 6 days then stop.     doxycycline 100 MG capsule  Commonly known as:  VIBRAMYCIN  Take 1 capsule (100 mg total) by mouth 2 (two) times daily. Take for 6 more days.     DSS 100 MG Caps  Take 100 mg by mouth 2 (two) times daily.     oxyCODONE-acetaminophen 5-325 MG per tablet  Commonly known as:  PERCOCET/ROXICET  Take 1-2 tablets by mouth every 4 (four) hours as needed for severe pain.       No Known Allergies     Follow-up Information   Follow up In 1 week. (f/u PCP in 1 week)        The results of significant diagnostics from this hospitalization (including imaging, microbiology, ancillary and laboratory) are listed below for reference.    Significant Diagnostic Studies: Dg Elbow Complete Right  02/19/2014   CLINICAL DATA:  Swelling, pain, redness, suspect cellulitis, evaluate joint involvement  EXAM: RIGHT ELBOW - COMPLETE 3+ VIEW  COMPARISON:  None.  FINDINGS: No displaced fracture or elbow joint effusion. Joint spaces are preserved. No erosions. There is apparent mild diffuse soft tissue swelling about the medial aspect of the elbow. No radiopaque foreign body. No subcutaneous emphysema. No discrete area of osteolysis to suggest osteomyelitis.  IMPRESSION: Soft tissue swelling about the elbow without associated fracture, dislocation or radiopaque foreign body.   Electronically Signed   By: Sandi Mariscal M.D.   On: 02/19/2014 23:24    Microbiology: No results found for this or any previous visit (from the past 240 hour(s)).   Labs: Basic Metabolic Panel:  Recent Labs Lab 02/19/14 1940 02/20/14 0610 02/21/14 0545  NA 140 140 139  K 3.6* 3.7 4.1  CL 102 105 106  CO2 24 19 23   GLUCOSE 94 100* 90  BUN 16 16 13   CREATININE 1.10 1.11* 1.02  CALCIUM 9.4 8.8 8.9   Liver Function Tests:  Recent Labs Lab 02/20/14 0610  AST 16  ALT 12  ALKPHOS 93  BILITOT <0.2*  PROT 6.5  ALBUMIN 3.1*   No results found for this basename: LIPASE, AMYLASE,  in the last 168 hours No results found for this basename: AMMONIA,  in the last 168 hours CBC:  Recent Labs Lab 02/19/14 1940 02/20/14 0610 02/21/14 0545  WBC 8.5 5.8 4.9  NEUTROABS 5.8  --   --   HGB 12.3 11.0* 12.0  HCT 37.3 33.3* 36.6  MCV 70.1* 70.1* 70.5*  PLT 213 153 172   Cardiac Enzymes: No results  found for this basename: CKTOTAL, CKMB, CKMBINDEX, TROPONINI,  in the last 168 hours BNP: BNP (last 3 results) No results found for this basename: PROBNP,  in the last 8760 hours CBG: No results found for this basename: GLUCAP,  in the last 168 hours     Signed:  Eugenie Filler MD [facility] 02/21/2014, 10:58 AM

## 2014-02-21 NOTE — Progress Notes (Signed)
Pt assessment unchanged from this am. D/c'd via wheelchair to private vehicle. No further questions

## 2014-02-21 NOTE — Care Management (Signed)
1130 02-21-14 CM did provide pt with PCP list and she is aware she can also call insurance for PCP in area. No further needs from CM at this time. Ocie [place]field [place], RN,BSN [phone]

## 2014-02-22 ENCOUNTER — Encounter (HOSPITAL_COMMUNITY): Payer: Self-pay | Admitting: Emergency Medicine

## 2014-02-22 ENCOUNTER — Inpatient Hospital Stay (HOSPITAL_COMMUNITY)
Admission: EM | Admit: 2014-02-22 | Discharge: 2014-02-27 | DRG: 603 | Disposition: A | Discharge status: Home / Self Care | Payer: BC Managed Care – PPO | Attending: Internal Medicine | Admitting: Internal Medicine

## 2014-02-22 DIAGNOSIS — E876 Hypokalemia: Secondary | ICD-10-CM | POA: Diagnosis present

## 2014-02-22 DIAGNOSIS — L03113 Cellulitis of right upper limb: Secondary | ICD-10-CM | POA: Diagnosis present

## 2014-02-22 DIAGNOSIS — IMO0002 Reserved for concepts with insufficient information to code with codable children: Principal | ICD-10-CM | POA: Diagnosis present

## 2014-02-22 DIAGNOSIS — D509 Iron deficiency anemia, unspecified: Secondary | ICD-10-CM | POA: Diagnosis present

## 2014-02-22 DIAGNOSIS — L0291 Cutaneous abscess, unspecified: Secondary | ICD-10-CM

## 2014-02-22 DIAGNOSIS — K219 Gastro-esophageal reflux disease without esophagitis: Secondary | ICD-10-CM | POA: Diagnosis present

## 2014-02-22 DIAGNOSIS — Z85038 Personal history of other malignant neoplasm of large intestine: Secondary | ICD-10-CM

## 2014-02-22 DIAGNOSIS — C189 Malignant neoplasm of colon, unspecified: Secondary | ICD-10-CM

## 2014-02-22 DIAGNOSIS — Z86718 Personal history of other venous thrombosis and embolism: Secondary | ICD-10-CM

## 2014-02-22 DIAGNOSIS — L0390 Cellulitis, unspecified: Secondary | ICD-10-CM | POA: Diagnosis present

## 2014-02-22 DIAGNOSIS — I82629 Acute embolism and thrombosis of deep veins of unspecified upper extremity: Secondary | ICD-10-CM

## 2014-02-22 DIAGNOSIS — K5900 Constipation, unspecified: Secondary | ICD-10-CM | POA: Diagnosis present

## 2014-02-22 DIAGNOSIS — I82409 Acute embolism and thrombosis of unspecified deep veins of unspecified lower extremity: Secondary | ICD-10-CM | POA: Diagnosis present

## 2014-02-22 LAB — I-STAT CHEM 8, ED
BUN: 14 mg/dL (ref 6–23)
Calcium, Ion: 1.19 mmol/L (ref 1.12–1.23)
Chloride: 101 mEq/L (ref 96–112)
Creatinine, Ser: 1.1 mg/dL (ref 0.50–1.10)
Glucose, Bld: 110 mg/dL — ABNORMAL HIGH (ref 70–99)
HCT: 43 % (ref 36.0–46.0)
Hemoglobin: 14.6 g/dL (ref 12.0–15.0)
Potassium: 3.2 mEq/L — ABNORMAL LOW (ref 3.7–5.3)
Sodium: 144 mEq/L (ref 137–147)
TCO2: 28 mmol/L (ref 0–100)

## 2014-02-22 LAB — CBC WITH DIFFERENTIAL/PLATELET
Basophils Absolute: 0 10*3/uL (ref 0.0–0.1)
Basophils Relative: 0 % (ref 0–1)
Eosinophils Absolute: 0.2 10*3/uL (ref 0.0–0.7)
Eosinophils Relative: 3 % (ref 0–5)
HCT: 37.1 % (ref 36.0–46.0)
Hemoglobin: 12.2 g/dL (ref 12.0–15.0)
Lymphocytes Relative: 22 % (ref 12–46)
Lymphs Abs: 1.8 10*3/uL (ref 0.7–4.0)
MCH: 23 pg — ABNORMAL LOW (ref 26.0–34.0)
MCHC: 32.9 g/dL (ref 30.0–36.0)
MCV: 70 fL — ABNORMAL LOW (ref 78.0–100.0)
Monocytes Absolute: 0.5 10*3/uL (ref 0.1–1.0)
Monocytes Relative: 6 % (ref 3–12)
Neutro Abs: 5.6 10*3/uL (ref 1.7–7.7)
Neutrophils Relative %: 70 % (ref 43–77)
Platelets: 210 10*3/uL (ref 150–400)
RBC: 5.3 MIL/uL — ABNORMAL HIGH (ref 3.87–5.11)
RDW: 17 % — ABNORMAL HIGH (ref 11.5–15.5)
WBC: 8.1 10*3/uL (ref 4.0–10.5)

## 2014-02-22 MED ORDER — VANCOMYCIN HCL 10 G IV SOLR
1250.0000 mg | Freq: Two times a day (BID) | INTRAVENOUS | Status: DC
  Administered 2014-02-22 – 2014-02-24 (×4): 1250 mg via INTRAVENOUS
  Filled 2014-02-22 (×5): qty 1250

## 2014-02-22 MED ORDER — MORPHINE SULFATE 2 MG/ML IJ SOLN
1.0000 mg | INTRAMUSCULAR | Status: DC | PRN
  Administered 2014-02-22 – 2014-02-23 (×2): 1 mg via INTRAVENOUS
  Administered 2014-02-23 – 2014-02-24 (×3): 2 mg via INTRAVENOUS
  Filled 2014-02-22 (×6): qty 1

## 2014-02-22 MED ORDER — VANCOMYCIN HCL 10 G IV SOLR
1500.0000 mg | Freq: Once | INTRAVENOUS | Status: DC
  Filled 2014-02-22: qty 1500

## 2014-02-22 MED ORDER — ENOXAPARIN SODIUM 40 MG/0.4ML ~~LOC~~ SOLN
40.0000 mg | SUBCUTANEOUS | Status: DC
  Administered 2014-02-24 – 2014-02-26 (×3): 40 mg via SUBCUTANEOUS
  Filled 2014-02-22 (×6): qty 0.4

## 2014-02-22 MED ORDER — ONDANSETRON HCL 4 MG/2ML IJ SOLN
4.0000 mg | Freq: Once | INTRAMUSCULAR | Status: DC
  Filled 2014-02-22: qty 2

## 2014-02-22 MED ORDER — SODIUM CHLORIDE 0.9 % IV SOLN
INTRAVENOUS | Status: DC
  Administered 2014-02-23: via INTRAVENOUS

## 2014-02-22 MED ORDER — POTASSIUM CHLORIDE CRYS ER 20 MEQ PO TBCR
40.0000 meq | EXTENDED_RELEASE_TABLET | Freq: Once | ORAL | Status: AC
  Administered 2014-02-22: 40 meq via ORAL
  Filled 2014-02-22: qty 2

## 2014-02-22 MED ORDER — MORPHINE SULFATE 4 MG/ML IJ SOLN: 4.0000 mg | Freq: Once | INTRAMUSCULAR | Status: DC

## 2014-02-22 MED ORDER — OXYCODONE-ACETAMINOPHEN 5-325 MG PO TABS
1.0000 | ORAL_TABLET | Freq: Once | ORAL | Status: AC
  Administered 2014-02-22: 1 via ORAL
  Filled 2014-02-22: qty 1

## 2014-02-22 NOTE — ED Notes (Signed)
Patient requests IV team per dr Maryan Rued.  EDprovider would like IV team to be called patient will be readmitted.

## 2014-02-22 NOTE — Progress Notes (Signed)
ANTIBIOTIC CONSULT NOTE - INITIAL  Pharmacy Consult for vancomycin Indication: cellulitis  No Known Allergies  Patient Measurements: Height: 5\' 11"  (180.3 cm) Weight: 237 lb (107.502 kg) IBW/kg (Calculated) : 70.8 Adjusted Body Weight: 85.5 kg  Vital Signs: Temp: 98.1 F (36.7 C) (04/15 1852) Temp src: Oral (04/15 1852) BP: 164/90 mmHg (04/15 1852) Pulse Rate: 82 (04/15 1852) Intake/Output from previous day:   Intake/Output from this shift:    Labs:  Recent Labs  02/20/14 0610 02/21/14 0545 02/22/14 1704 02/22/14 1717  WBC 5.8 4.9 8.1  --   HGB 11.0* 12.0 12.2 14.6  PLT 153 172 210  --   CREATININE 1.11* 1.02  --  1.10    Medical History: Past Medical History  Diagnosis Date  . Blood transfusion   . History of migraine headaches   . iron deficiency anemia   . Adenocarcinoma sigmoid colon 05/12/11    Stage III(pT2pN2b) descending sigmoid  . Multiple hemangiomas     Liver  . Renal cysts, acquired, bilateral   . Pneumothorax 07/19/11    Bilateral  . Arm DVT (deep venous thromboembolism), acute 07/04/11    r/t PAC  . 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 GERD (gastroesophageal reflux disease)   . Incisional hernia     Medications:  See EMR  Assessment: 53 year old female, recently admitted 4/12-4/14 for cellulitis, here today for worsening redness, pain, swelling of her upper extremity.  Pt is hypertensive, afebrile, without leukocytosis.  Pt discharged on doxycycline and cephalexin.  Goal of Therapy:  Vancomycin trough level 10-15 mcg/ml  Plan:  Vancomycin 1500 mg IV x1 followed by vancomycin 1250 mg IV q12h VT as indicated F/u clinical progression   Hughes Better, PharmD, BCPS Clinical Pharmacist Pager: [phone] 02/22/2014 6:58 PM

## 2014-02-22 NOTE — H&P (Addendum)
[facility] History and Physical  Mary [name] GYI:[id number] DOB: Mar 18, 1961 DOA: 02/22/2014  Referring physician: EDP PCP: No PCP Per Patient   Chief Complaint: worsening of the right upper extremity.   HPI: Mary [name] is a 53 y.o. female admitted to the hospital on 412 and discharged on 4/14 for cellulitis on oral doxy and keflex, comes back today for worsening redness of the upper extremity with worsening pain and swelling and low grade fever yesterday and today.  She reports she took three tablets of doxycycline, but her itching and pain increased and her arm was red and swollen. On arrival to ED, she was found to be afebrile and no leukocytosis. She was referred to medical service for admission for the management of recurrent cellulitis.    Review of Systems:  See HPi otherwise negative.   Past Medical History  Diagnosis Date  . Blood transfusion   . History of migraine headaches   . iron deficiency anemia   . Adenocarcinoma sigmoid colon 05/12/11    Stage III(pT2pN2b) descending sigmoid  . Multiple hemangiomas     Liver  . Renal cysts, acquired, bilateral   . Pneumothorax 07/19/11    Bilateral  . Arm DVT (deep venous thromboembolism), acute 07/04/11    r/t PAC  . 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 GERD (gastroesophageal reflux disease)   . Incisional hernia    Past Surgical History  Procedure Laterality Date  . Abdominal hysterectomy    . Colon surgery  05/17/11    Partial colectomy  . Portacath placement  07/20/11    Right side placed  . Portacath placement      left side & right side  . Port-a-cath removal  05/06/2012    Procedure: MINOR REMOVAL PORT-A-CATH;  Surgeon: Gwenyth Ober, MD;  Location: [place];  Service: General;  Laterality: Right;  . Ventral hernia repair  09/06/2012    Procedure: LAPAROSCOPIC VENTRAL HERNIA;  Surgeon: Gwenyth Ober, MD;  Location: [place];   Service: General;  Laterality: N/A;  . Insertion of mesh  09/06/2012    Procedure: INSERTION OF MESH;  Surgeon: Gwenyth Ober, MD;  Location: [place];  Service: General;  Laterality: N/A;  . Hernia repair  09/06/12    lap incisional hernia   Social History:  reports that she has never smoked. She has never used smokeless tobacco. She reports that she does not drink alcohol or use illicit drugs.  No Known Allergies  No family history on file.   Prior to Admission medications   Medication Sig Start Date End Date Taking? Authorizing Provider  Biotin 5000 MCG CAPS Take 5,000 mcg by mouth every morning.   Yes Historical Provider, MD  cephALEXin (KEFLEX) 500 MG capsule Take 1 capsule (500 mg total) by mouth every 12 (twelve) hours. Take for 6 days then stop. 02/21/14  Yes Eugenie Filler, MD  docusate sodium (COLACE) 100 MG capsule Take 100 mg by mouth 2 (two) times daily as needed for mild constipation.   Yes Historical Provider, MD  doxycycline (VIBRAMYCIN) 100 MG capsule Take 1 capsule (100 mg total) by mouth 2 (two) times daily. Take for 6 more days. 02/21/14  Yes Eugenie Filler, MD  hydroxypropyl methylcellulose (ISOPTO TEARS) 2.5 % ophthalmic solution Place 1 drop into both eyes daily as needed for dry eyes.   Yes Historical Provider, MD  OVER THE COUNTER MEDICATION Take 1 capsule  by mouth 2 (two) times daily. [employer] - vitamin for hair   Yes Historical Provider, MD  oxyCODONE-acetaminophen (PERCOCET/ROXICET) 5-325 MG per tablet Take 1-2 tablets by mouth every 4 (four) hours as needed for severe pain. 02/21/14  Yes Eugenie Filler, MD  Pediatric Multivit-Minerals-C (RA GUMMY VITAMINS & MINERALS PO) Take 1 capsule by mouth every morning.   Yes Historical Provider, MD   Physical Exam: Filed Vitals:   02/22/14 1518  BP: 146/84  Pulse: 97  Temp: 98.4 F (36.9 C)  Resp: 16    BP 146/84  Pulse 97  Temp(Src) 98.4 F (36.9 C) (Oral)  Resp 16  Ht 5\' 11"  (1.803 m)  Wt 107.502 kg  (237 lb)  BMI 33.07 kg/m2  SpO2 100%  General:  Appears calm and comfortable Eyes: PERRL, normal lids, irises & conjunctiva ENT: grossly normal hearing, lips & tongue Neck: no LAD, masses or thyromegaly Cardiovascular: RRR, no m/r/g. No LE edema. Respiratory: CTA bilaterally, no w/r/r. Normal respiratory effort. Abdomen: soft, ntnd Skin: no rash or induration seen on limited exam Musculoskeletal: tender and redness over the right upper arm on the flexor aspect.  Psychiatric: grossly normal mood and affect, speech fluent and appropriate Neurologic: grossly non-focal.          Labs on Admission:  Basic Metabolic Panel:  Recent Labs Lab 02/19/14 1940 02/20/14 0610 02/21/14 0545 02/22/14 1717  NA 140 140 139 144  K 3.6* 3.7 4.1 3.2*  CL 102 105 106 101  CO2 24 19 23   --   GLUCOSE 94 100* 90 110*  BUN 16 16 13 14   CREATININE 1.10 1.11* 1.02 1.10  CALCIUM 9.4 8.8 8.9  --    Liver Function Tests:  Recent Labs Lab 02/20/14 0610  AST 16  ALT 12  ALKPHOS 93  BILITOT <0.2*  PROT 6.5  ALBUMIN 3.1*   No results found for this basename: LIPASE, AMYLASE,  in the last 168 hours No results found for this basename: AMMONIA,  in the last 168 hours CBC:  Recent Labs Lab 02/19/14 1940 02/20/14 0610 02/21/14 0545 02/22/14 1704 02/22/14 1717  WBC 8.5 5.8 4.9 8.1  --   NEUTROABS 5.8  --   --  5.6  --   HGB 12.3 11.0* 12.0 12.2 14.6  HCT 37.3 33.3* 36.6 37.1 43.0  MCV 70.1* 70.1* 70.5* 70.0*  --   PLT 213 153 172 210  --    Cardiac Enzymes: No results found for this basename: CKTOTAL, CKMB, CKMBINDEX, TROPONINI,  in the last 168 hours  BNP (last 3 results) No results found for this basename: PROBNP,  in the last 8760 hours CBG: No results found for this basename: GLUCAP,  in the last 168 hours  Radiological Exams on Admission: No results found.    Assessment/Plan Active Problems:   Cellulitis  1. Recurrent Cellulitis: - admit to med surg.  -restart her on  IV vancomycin - elevate the arm/ warm compress if needed - pain control    2. H/o colon cancer: in remission for 3 years.   3. H/o DVT: Not on anticoagulation at this time.   4. Hypokalemia;  - replete as needed.   5. GERD: PPI.  6. DVT prophylaxis.     Code Status: full code.  Family Communication: none at bedside.  Disposition Plan: admit to medsurg  Time spent: 55 min  [facility] Pager [phone]

## 2014-02-22 NOTE — ED Notes (Signed)
hospitalist in room, IV team has still not come to see patient to start IV.  Unable to give pain medication at this time.

## 2014-02-22 NOTE — ED Notes (Signed)
The pt is c/o pain and redness in her rt upper arm area.  She was diagnosed and admitted and was discharged 2 days ago from this hosptial.  She does not rmemeber who she is to follow up with so she came back here because the the pain has not gone away

## 2014-02-22 NOTE — ED Provider Notes (Addendum)
CSN: [id number]     Arrival date & time 02/22/14  1501 History   First MD Initiated Contact with Patient 02/22/14 1613     Chief Complaint  Patient presents with  . Cellulitis     (Consider location/radiation/quality/duration/timing/severity/associated sxs/prior Treatment) HPI Comments: Patient admitted 3 days ago for right arm cellulitis. She was on IV vancomycin and yesterday was discharged on doxycycline. When she went home from the hospital she had no redness in her arm. She was discharged from the hospital yesterday and she is taking approximately 3 doses of her doxycycline and by noon today her arm was starting to hurt with redness and streaking down her arm. She denies fever or other symptoms  The history is provided by the patient.    Past Medical History  Diagnosis Date  . Blood transfusion   . History of migraine headaches   . iron deficiency anemia   . Adenocarcinoma sigmoid colon 05/12/11    Stage III(pT2pN2b) descending sigmoid  . Multiple hemangiomas     Liver  . Renal cysts, acquired, bilateral   . Pneumothorax 07/19/11    Bilateral  . Arm DVT (deep venous thromboembolism), acute 07/04/11    r/t PAC  . 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 GERD (gastroesophageal reflux disease)   . Incisional hernia    Past Surgical History  Procedure Laterality Date  . Abdominal hysterectomy    . Colon surgery  05/17/11    Partial colectomy  . Portacath placement  07/20/11    Right side placed  . Portacath placement      left side & right side  . Port-a-cath removal  05/06/2012    Procedure: MINOR REMOVAL PORT-A-CATH;  Surgeon: Gwenyth Ober, MD;  Location: [place];  Service: General;  Laterality: Right;  . Ventral hernia repair  09/06/2012    Procedure: LAPAROSCOPIC VENTRAL HERNIA;  Surgeon: Gwenyth Ober, MD;  Location: [place];  Service: General;  Laterality: N/A;  . Insertion of mesh  09/06/2012   Procedure: INSERTION OF MESH;  Surgeon: Gwenyth Ober, MD;  Location: [place];  Service: General;  Laterality: N/A;  . Hernia repair  09/06/12    lap incisional hernia   No family history on file. History  Substance Use Topics  . Smoking status: Never Smoker   . Smokeless tobacco: Never Used  . Alcohol Use: No   OB History   Grav Para Term Preterm Abortions TAB SAB Ect Mult Living                 Review of Systems  Constitutional: Negative for fever.  All other systems reviewed and are negative.     Allergies  Review of patient's allergies indicates no known allergies.  Home Medications   Prior to Admission medications   Medication Sig Start Date End Date Taking? Authorizing Provider  cephALEXin (KEFLEX) 500 MG capsule Take 1 capsule (500 mg total) by mouth every 12 (twelve) hours. Take for 6 days then stop. 02/21/14   Eugenie Filler, MD  docusate sodium 100 MG CAPS Take 100 mg by mouth 2 (two) times daily. 02/21/14   Eugenie Filler, MD  doxycycline (VIBRAMYCIN) 100 MG capsule Take 1 capsule (100 mg total) by mouth 2 (two) times daily. Take for 6 more days. 02/21/14   Eugenie Filler, MD  oxyCODONE-acetaminophen (PERCOCET/ROXICET) 5-325 MG per tablet Take 1-2 tablets by mouth every 4 (four) hours as  needed for severe pain. 02/21/14   Eugenie Filler, MD   BP 146/84  Pulse 97  Temp(Src) 98.4 F (36.9 C) (Oral)  Resp 16  Ht 5\' 11"  (1.803 m)  Wt 237 lb (107.502 kg)  BMI 33.07 kg/m2  SpO2 100% Physical Exam  Nursing note and vitals reviewed. Constitutional: She is oriented to person, place, and time. She appears well-developed and well-nourished. No distress.  HENT:  Head: Normocephalic and atraumatic.  Eyes: EOM are normal. Pupils are equal, round, and reactive to light.  Cardiovascular: Normal rate, regular rhythm, normal heart sounds and intact distal pulses.   Pulmonary/Chest: Effort normal and breath sounds normal.  Musculoskeletal: She exhibits tenderness.        Arms: Neurological: She is alert and oriented to person, place, and time.  Skin: Skin is warm. There is erythema.  Psychiatric: She has a normal mood and affect. Her behavior is normal.    ED Course  Procedures (including critical care time) Labs Review Labs Reviewed  CBC WITH DIFFERENTIAL - Abnormal; Notable for the following:    RBC 5.30 (*)    MCV 70.0 (*)    MCH 23.0 (*)    RDW 17.0 (*)    All other components within normal limits  I-STAT CHEM 8, ED - Abnormal; Notable for the following:    Potassium 3.2 (*)    Glucose, Bld 110 (*)    All other components within normal limits    Imaging Review No results found.   EKG Interpretation None      MDM   Final diagnoses:  Cellulitis    Patient recently admitted for cellulitis and discharged yesterday on Keflex and doxycycline with return of redness, pain and swelling today. The patient has been taking doxycycline and Keflex as prescribed and denies fever. On exam patient has streaking redness and tenderness of the right arm which she states is consistent with the initial reason she was admitted. Will start patient back on IV antibiotics and admit for further care. Bedside ultrasound showed no evidence of abscess formation    Blanchie Dessert, MD 02/22/14 1709  Blanchie Dessert, MD 02/22/14 1757

## 2014-02-22 NOTE — Progress Notes (Signed)
NURSING PROGRESS NOTE  [name] [id number] Admission Data: 02/22/2014 7:43 PM Attending Provider: Hosie Poisson, MD PCP:No PCP Per Patient Code Status: FULL  Mary [name] is a 53 y.o. female patient admitted from ED:  -No acute distress noted.  -No complaints of shortness of breath.  -No complaints of chest pain.    Blood pressure 164/90, pulse 82, temperature 98.1 F (36.7 C), temperature source Oral, resp. rate 16, height 5\' 11"  (1.803 m), weight 107.502 kg (237 lb), SpO2 100.00%.   Allergies:  Review of patient's allergies indicates no known allergies.  Past Medical History:   has a past medical history of Blood transfusion; History of migraine headaches; iron deficiency anemia; Adenocarcinoma sigmoid colon (05/12/11); Multiple hemangiomas; Renal cysts, acquired, bilateral; Pneumothorax (07/19/11); Arm DVT (deep venous thromboembolism), acute (07/04/11); Neuropathy due to drug (2012); GERD (gastroesophageal reflux disease); and Incisional hernia.  Past Surgical History:   has past surgical history that includes Abdominal hysterectomy; Colon surgery (05/17/11); Portacath placement (07/20/11); Portacath placement; Port-a-cath removal (05/06/2012); Ventral hernia repair (09/06/2012); Insertion of mesh (09/06/2012); and Hernia repair (09/06/12).  Social History:   reports that she has never smoked. She has never used smokeless tobacco. She reports that she does not drink alcohol or use illicit drugs.  Skin: Cellulitis to RUE  Patient/Family orientated to room. Information packet given to patient/family. Admission inpatient armband information verified with patient/family to include name and date of birth and placed on patient arm. Side rails up x 2, fall assessment and education completed with patient/family. Patient/family able to verbalize understanding of risk associated with falls and verbalized understanding to call for assistance before getting out of bed. Call light within reach.  Patient/family able to voice and demonstrate understanding of unit orientation instructions.    Will continue to evaluate and treat per MD orders.

## 2014-02-23 DIAGNOSIS — C189 Malignant neoplasm of colon, unspecified: Secondary | ICD-10-CM

## 2014-02-23 DIAGNOSIS — I82409 Acute embolism and thrombosis of unspecified deep veins of unspecified lower extremity: Secondary | ICD-10-CM

## 2014-02-23 DIAGNOSIS — IMO0002 Reserved for concepts with insufficient information to code with codable children: Principal | ICD-10-CM

## 2014-02-23 LAB — BASIC METABOLIC PANEL
BUN: 16 mg/dL (ref 6–23)
CO2: 24 mEq/L (ref 19–32)
Calcium: 9 mg/dL (ref 8.4–10.5)
Chloride: 108 mEq/L (ref 96–112)
Creatinine, Ser: 1.03 mg/dL (ref 0.50–1.10)
GFR calc Af Amer: 71 mL/min — ABNORMAL LOW (ref >90)
GFR calc non Af Amer: 61 mL/min — ABNORMAL LOW (ref >90)
Glucose, Bld: 106 mg/dL — ABNORMAL HIGH (ref 70–99)
Potassium: 4.1 mEq/L (ref 3.7–5.3)
Sodium: 143 mEq/L (ref 137–147)

## 2014-02-23 MED ORDER — OXYCODONE HCL 5 MG PO TABS
5.0000 mg | ORAL_TABLET | ORAL | Status: DC | PRN
  Administered 2014-02-23 – 2014-02-25 (×6): 10 mg via ORAL
  Administered 2014-02-25: 5 mg via ORAL
  Administered 2014-02-26 – 2014-02-27 (×2): 10 mg via ORAL
  Filled 2014-02-23 (×9): qty 2

## 2014-02-23 MED ORDER — DIPHENHYDRAMINE HCL 25 MG PO CAPS
25.0000 mg | ORAL_CAPSULE | Freq: Four times a day (QID) | ORAL | Status: DC | PRN
  Administered 2014-02-23 – 2014-02-27 (×5): 25 mg via ORAL
  Filled 2014-02-23 (×5): qty 1

## 2014-02-23 MED ORDER — DIPHENHYDRAMINE HCL 25 MG PO CAPS: 25.0000 mg | ORAL_CAPSULE | Freq: Four times a day (QID) | ORAL | Status: DC | PRN

## 2014-02-23 MED ORDER — CAMPHOR-MENTHOL 0.5-0.5 % EX LOTN
TOPICAL_LOTION | CUTANEOUS | Status: DC | PRN
  Filled 2014-02-23: qty 222

## 2014-02-23 NOTE — Progress Notes (Signed)
Dear Doctor: This patient has been identified as a candidate for PICC for the following reason (s): drug pH or osmolality (causing phlebitis, infiltration in 24 hours) If you agree, please write an order for the indicated device. For any questions contact the Vascular Access Team at [phone] if no answer, please leave a message.  Thank you for supporting the early vascular access assessment program. Mickeal Daws M Freda Jaquith   

## 2014-02-23 NOTE — Progress Notes (Signed)
[facility] PROGRESS NOTE  [name] XVQ:[id number] DOB: [birth year] DOA: 02/22/2014 PCP: No PCP Per Patient  Assessment/Plan: 1. Recurrent Cellulitis of RUE  Patient notes improvement today with IV antibiotics. Blood cultures NGTD No leukocytosis, afebrile,  DG elbow showed no fracture,  Soft tissue swelling Continue IV vancomycin treatment Consider ID consult if no improvement  2. Rule out DVT, SVT Upper and lower venous doppler negative for thrombosis Previous DVT in LUE.  3. Chronic R. Leg pain  Per patient. Pain is left over from previous cancer treatment and a hx of DVT.  Pain is described and a sharp pain worse today than yesterday  Doppler negative for DVT -showed bakers cyst on right.   On pain medication.      Code Status: full Family Communication: no family at beside this morning Disposition Plan:  Discharge possible tomorrow  Consultants:  none  Procedures:  none  Antibiotics:  Vancomycin   HPI/Subjective: Mary [name] is a 53 y.o female admitted to the hospital on 4/12 and discharchged on 4/14 for cellulitis.  She was discharged on oral doxy and cephalexin.  She came back o the ED department on 4/15 with worsening redness of the upper extremity and worsening pain and swelling.  She was afebrile and no leukocytosis.     Objective: Filed Vitals:   02/23/14 1452  BP: 102/71  Pulse: 60  Temp: 98 F (36.7 C)  Resp: 18    Intake/Output Summary (Last 24 hours) at 02/23/14 1535 Last data filed at 02/23/14 0806  Gross per 24 hour  Intake    120 ml  Output      0 ml  Net    120 ml   Filed Weights   02/22/14 1518 02/22/14 1852  Weight: 107.502 kg (237 lb) 107.502 kg (237 lb)    Exam:  Physical Exam  Nursing note and vitals reviewed. Constitutional: She is oriented to person, place, and time. She appears well-developed and well-nourished.  HENT:  Head: Normocephalic and atraumatic.  Eyes: Pupils are equal, round, and reactive to  light.  Neck: No JVD present.  Cardiovascular: Normal rate, regular rhythm, normal heart sounds and intact distal pulses.  Exam reveals no friction rub.   No murmur heard. Respiratory: Effort normal and breath sounds normal. No stridor. No respiratory distress. She has no wheezes. She exhibits no tenderness.  GI: She exhibits no distension. There is no tenderness.  Musculoskeletal: Normal range of motion. She exhibits no tenderness.  Neurological: She is alert and oriented to person, place, and time.  Skin: Skin is warm, dry and intact. Rash noted. Rash is not pustular. There is erythema. No cyanosis. Nails show no clubbing.     Psychiatric: She has a normal mood and affect. Her speech is normal and behavior is normal. Judgment and thought content normal. Cognition and memory are normal.    Data Reviewed: Basic Metabolic Panel:  Recent Labs Lab 02/19/14 1940 02/20/14 0610 02/21/14 0545 02/22/14 1717 02/23/14 0345  Mary [name] 140 140 139 144 143  K 3.6* 3.7 4.1 3.2* 4.1  CL 102 105 106 101 108  CO2 24 19 23   --  24  GLUCOSE 94 100* 90 110* 106*  BUN 16 16 13 14 16   CREATININE 1.10 1.11* 1.02 1.10 1.03  CALCIUM 9.4 8.8 8.9  --  9.0   Liver Function Tests:  Recent Labs Lab 02/20/14 0610  AST 16  ALT 12  ALKPHOS 93  BILITOT <0.2*  PROT 6.5  ALBUMIN  3.1*  CBC:  Recent Labs Lab 02/19/14 1940 02/20/14 0610 02/21/14 0545 02/22/14 1704 02/22/14 1717  WBC 8.5 5.8 4.9 8.1  --   NEUTROABS 5.8  --   --  5.6  --   HGB 12.3 11.0* 12.0 12.2 14.6  HCT 37.3 33.3* 36.6 37.1 43.0  MCV 70.1* 70.1* 70.5* 70.0*  --   PLT 213 153 172 210  --     Recent Results (from the past 240 hour(s))  CULTURE, BLOOD (ROUTINE X 2)     Status: None   Collection Time    02/20/14 12:56 AM      Result Value Ref Range Status   Specimen Description BLOOD LEFT ANTECUBITAL   Final   Special Requests BOTTLES DRAWN AEROBIC ONLY 10CC   Final   Culture  Setup Time     Final   Value: 02/20/2014 08:14      Performed at [facility]   Culture     Final   Value:        BLOOD CULTURE RECEIVED NO GROWTH TO DATE CULTURE WILL BE HELD FOR 5 DAYS BEFORE ISSUING A FINAL NEGATIVE REPORT     Performed at [facility]   Report Status PENDING   Incomplete  CULTURE, BLOOD (ROUTINE X 2)     Status: None   Collection Time    02/20/14  1:03 AM      Result Value Ref Range Status   Specimen Description BLOOD LEFT FOREARM   Final   Special Requests BOTTLES DRAWN AEROBIC ONLY [place]   Final   Culture  Setup Time     Final   Value: 02/20/2014 08:14     Performed at [facility]   Culture     Final   Value:        BLOOD CULTURE RECEIVED NO GROWTH TO DATE CULTURE WILL BE HELD FOR 5 DAYS BEFORE ISSUING A FINAL NEGATIVE REPORT     Performed at [facility]   Report Status PENDING   Incomplete     Scheduled Meds: . enoxaparin (LOVENOX) injection  40 mg Subcutaneous Q24H  . ondansetron  4 mg Intravenous Once  . vancomycin  1,250 mg Intravenous Q12H   Continuous Infusions:   Active Problems:   Cellulitis    Time spent: [address] [place], [place] [phone]  [facility]  If 7PM-7AM, please contact night-coverage at [email or web address], password [facility] 02/23/2014, 3:35 PM  LOS: 1 day

## 2014-02-23 NOTE — Progress Notes (Signed)
Addendum  Patient seen and examined, chart and data base reviewed.  I agree with the above assessment and plan.  For full details please see Mrs. Imogene Burn PA note.  Recurrent right upper extremity cellulitis, improving on vancomycin.   Birdie Hopes, MD [facility] Pager: [phone] 02/23/2014, 5:28 PM

## 2014-02-24 DIAGNOSIS — D509 Iron deficiency anemia, unspecified: Secondary | ICD-10-CM

## 2014-02-24 DIAGNOSIS — I82629 Acute embolism and thrombosis of deep veins of unspecified upper extremity: Secondary | ICD-10-CM

## 2014-02-24 MED ORDER — SODIUM CHLORIDE 0.9 % IJ SOLN
10.0000 mL | INTRAMUSCULAR | Status: DC | PRN
  Administered 2014-02-24 – 2014-02-27 (×7): 10 mL
  Administered 2014-02-27: 30 mL
  Administered 2014-02-27: 10 mL

## 2014-02-24 MED ORDER — SENNOSIDES-DOCUSATE SODIUM 8.6-50 MG PO TABS
2.0000 | ORAL_TABLET | Freq: Once | ORAL | Status: AC
  Administered 2014-02-24: 2 via ORAL
  Filled 2014-02-24: qty 2

## 2014-02-24 MED ORDER — VANCOMYCIN HCL 10 G IV SOLR
1250.0000 mg | Freq: Two times a day (BID) | INTRAVENOUS | Status: DC
  Administered 2014-02-24 – 2014-02-27 (×6): 1250 mg via INTRAVENOUS
  Filled 2014-02-24 (×9): qty 1250

## 2014-02-24 NOTE — Progress Notes (Signed)
Attempted to place PICC line in Left Basilic,accessed vein without difficulty and could thread line to clavical and could not pass any father. Patient stated having PAC removed from that side and placed on right due to clott their. Could feel resistance therefore removed line and placed a midline. Staff nurse aware and will notify MD.

## 2014-02-24 NOTE — Progress Notes (Signed)
Dear Doctor:  This patient has been identified as a candidate for PICC for the following reason (s): IV therapy over 48 hours If you agree, please write an order for the indicated device. For any questions contact the Vascular Access Team at [phone] if no answer, please leave a message. Vernona Rieger BSN RN Thank you for supporting the early vascular access assessment program.

## 2014-02-24 NOTE — Progress Notes (Signed)
[facility] PROGRESS NOTE  [name] XLK:[id number] DOB: 1961/06/20 DOA: 02/22/2014 PCP: No PCP Per Patient  Assessment/Plan: Recurrent Cellulitis of RUE  Erythema improved, induration slightly expanded (4/17) Blood cultures NGTD No leukocytosis, afebrile,  DG elbow showed no fracture,  Soft tissue swelling Continue IV vancomycin treatment Plan to keep RUE well elevated to decrease fluid accumulation in the area. Midline access placed as second IV infiltrated.  Rule out DVT, SVT Upper and lower venous doppler negative for thrombosis Previous DVT in LUE.  Chronic R. Leg pain Per patient. Pain is left over from previous cancer treatment and a hx of DVT. Pain is described and a sharp pain worse today than yesterday Doppler negative for DVT -showed bakers cyst on right.  On pain medication.  Code Status: full Family Communication: no family at beside this morning Disposition Plan:  Discharge possible tomorrow  Consultants:  none  Procedures:  Midline placement.  Antibiotics:  Vancomycin   HPI/Subjective: Mary [name] is a 53 y.o female admitted to the hospital on 4/12 and discharchged on 4/14 for cellulitis.  She was discharged on oral doxy and cephalexin.  She came back o the ED department on 4/15 with worsening redness of the upper extremity and worsening pain and swelling.  She was afebrile and no leukocytosis.     4/17 no new complaints with regard to her arm.  Does not sleep well in the hospital.  No BM since before admission.  Objective: Filed Vitals:   02/24/14 0433  BP: 126/78  Pulse: 70  Temp: 97.6 F (36.4 C)  Resp: 18   No intake or output data in the 24 hours ending 02/24/14 1329 Filed Weights   02/22/14 1518 02/22/14 1852  Weight: 107.502 kg (237 lb) 107.502 kg (237 lb)    Exam:  Physical Exam General: WD, Obese, Appears calm and comfortable  Eyes: normal lids, irises & conjunctiva  ENT: grossly normal hearing, lips & tongue  Neck:  no LAD, masses or thyromegaly  Cardiovascular: RRR, no m/r/g. No LE edema.  Respiratory: CTA bilaterally, no w/r/r. Normal respiratory effort.  Abdomen: soft, nt, nd +bs, no masses Skin: right UE with induration and mild erythema in the tricep area extending into the antecubital area.      Data Reviewed: Basic Metabolic Panel:  Recent Labs Lab 02/19/14 1940 02/20/14 0610 02/21/14 0545 02/22/14 1717 02/23/14 0345  NA 140 140 139 144 143  K 3.6* 3.7 4.1 3.2* 4.1  CL 102 105 106 101 108  CO2 24 19 23   --  24  GLUCOSE 94 100* 90 110* 106*  BUN 16 16 13 14 16   CREATININE 1.10 1.11* 1.02 1.10 1.03  CALCIUM 9.4 8.8 8.9  --  9.0   Liver Function Tests:  Recent Labs Lab 02/20/14 0610  AST 16  ALT 12  ALKPHOS 93  BILITOT <0.2*  PROT 6.5  ALBUMIN 3.1*  CBC:  Recent Labs Lab 02/19/14 1940 02/20/14 0610 02/21/14 0545 02/22/14 1704 02/22/14 1717  WBC 8.5 5.8 4.9 8.1  --   NEUTROABS 5.8  --   --  5.6  --   HGB 12.3 11.0* 12.0 12.2 14.6  HCT 37.3 33.3* 36.6 37.1 43.0  MCV 70.1* 70.1* 70.5* 70.0*  --   PLT 213 153 172 210  --     Recent Results (from the past 240 hour(s))  CULTURE, BLOOD (ROUTINE X 2)     Status: None   Collection Time    02/20/14 12:56 AM  Result Value Ref Range Status   Specimen Description BLOOD LEFT ANTECUBITAL   Final   Special Requests BOTTLES DRAWN AEROBIC ONLY 10CC   Final   Culture  Setup Time     Final   Value: 02/20/2014 08:14     Performed at [facility]   Culture     Final   Value:        BLOOD CULTURE RECEIVED NO GROWTH TO DATE CULTURE WILL BE HELD FOR 5 DAYS BEFORE ISSUING A FINAL NEGATIVE REPORT     Performed at [facility]   Report Status PENDING   Incomplete  CULTURE, BLOOD (ROUTINE X 2)     Status: None   Collection Time    02/20/14  1:03 AM      Result Value Ref Range Status   Specimen Description BLOOD LEFT FOREARM   Final   Special Requests BOTTLES DRAWN AEROBIC ONLY [place]   Final   Culture  Setup  Time     Final   Value: 02/20/2014 08:14     Performed at [facility]   Culture     Final   Value:        BLOOD CULTURE RECEIVED NO GROWTH TO DATE CULTURE WILL BE HELD FOR 5 DAYS BEFORE ISSUING A FINAL NEGATIVE REPORT     Performed at [facility]   Report Status PENDING   Incomplete     Scheduled Meds: . enoxaparin (LOVENOX) injection  40 mg Subcutaneous Q24H  . ondansetron  4 mg Intravenous Once  . vancomycin  1,250 mg Intravenous Q12H   Continuous Infusions:   Active Problems:   Cellulitis    Time spent: [address] [place], [place] [phone]  [facility]  If 7PM-7AM, please contact night-coverage at [email or web address], password [facility] 02/24/2014, 1:29 PM  LOS: 2 days

## 2014-02-24 NOTE — Progress Notes (Signed)
Addendum  Patient seen and examined, chart and data base reviewed.  I agree with the above assessment and plan.  For full details please see Mrs. Imogene Burn PA note.  Right upper tube cellulitis, continue vancomycin.   Birdie Hopes, MD [facility] Pager: [phone] 02/24/2014, 6:55 PM

## 2014-02-25 LAB — BASIC METABOLIC PANEL
BUN: 15 mg/dL (ref 6–23)
CO2: 24 mEq/L (ref 19–32)
Calcium: 9 mg/dL (ref 8.4–10.5)
Chloride: 106 mEq/L (ref 96–112)
Creatinine, Ser: 1.06 mg/dL (ref 0.50–1.10)
GFR calc Af Amer: 69 mL/min — ABNORMAL LOW (ref >90)
GFR calc non Af Amer: 59 mL/min — ABNORMAL LOW (ref >90)
Glucose, Bld: 100 mg/dL — ABNORMAL HIGH (ref 70–99)
Potassium: 4.1 mEq/L (ref 3.7–5.3)
Sodium: 140 mEq/L (ref 137–147)

## 2014-02-25 MED ORDER — ONDANSETRON HCL 4 MG/2ML IJ SOLN
4.0000 mg | Freq: Four times a day (QID) | INTRAMUSCULAR | Status: DC | PRN
  Administered 2014-02-25: 4 mg via INTRAVENOUS

## 2014-02-25 MED ORDER — POLYETHYLENE GLYCOL 3350 17 G PO PACK
17.0000 g | PACK | Freq: Every day | ORAL | Status: DC
  Administered 2014-02-25 – 2014-02-26 (×2): 17 g via ORAL
  Filled 2014-02-25 (×3): qty 1

## 2014-02-25 MED ORDER — MAGNESIUM HYDROXIDE 400 MG/5ML PO SUSP
30.0000 mL | Freq: Once | ORAL | Status: AC
  Administered 2014-02-25: 30 mL via ORAL
  Filled 2014-02-25: qty 30

## 2014-02-25 NOTE — Progress Notes (Signed)
[facility] PROGRESS NOTE  ARICA BEVILACQUA GBT:[id number] DOB: 1961-04-03 DOA: 02/22/2014 PCP: No PCP Per Patient   HPI: Mary [name] is a 53 y.o female admitted to the hospital on 4/12 and discharchged on 4/14 for cellulitis.  She was discharged on oral doxy and cephalexin.  She came back o the ED department on 4/15 with worsening redness of the upper extremity and worsening pain and swelling.  She was afebrile and no leukocytosis.     Subjective: Continued to complain about pain and tenderness.  Assessment/Plan:  Recurrent Cellulitis of RUE  Erythema improved, induration slightly expanded (4/17) Blood cultures NGTD No leukocytosis, afebrile,  DG elbow showed no fracture,  Soft tissue swelling Continue IV vancomycin treatment Plan to keep RUE well elevated to decrease fluid accumulation in the area. Midline access placed as second IV infiltrated.  Rule out DVT, SVT Upper and lower venous doppler negative for thrombosis Previous DVT in LUE.  Chronic R. Leg pain Per patient. Pain is left over from previous cancer treatment and a hx of DVT. Pain is described and a sharp pain worse today than yesterday Doppler negative for DVT -showed bakers cyst on right.  On pain medication.  Code Status: full Family Communication: no family at beside this morning Disposition Plan:  Discharge possible tomorrow  Consultants:  none  Procedures:  Midline placement.  Antibiotics:  Vancomycin    Objective: Filed Vitals:   02/25/14 0441  BP: 130/74  Pulse: 51  Temp: 97.9 F (36.6 C)  Resp: 18    Intake/Output Summary (Last 24 hours) at 02/25/14 1010 Last data filed at 02/25/14 0043  Gross per 24 hour  Intake    500 ml  Output      0 ml  Net    500 ml   Filed Weights   02/22/14 1518 02/22/14 1852 02/25/14 0441  Weight: 107.502 kg (237 lb) 107.502 kg (237 lb) 107 kg (235 lb 14.3 oz)    Exam:  Physical Exam General: WD, Obese, Appears calm and comfortable  Eyes:  normal lids, irises & conjunctiva  ENT: grossly normal hearing, lips & tongue  Neck: no LAD, masses or thyromegaly  Cardiovascular: RRR, no m/r/g. No LE edema.  Respiratory: CTA bilaterally, no w/r/r. Normal respiratory effort.  Abdomen: soft, nt, nd +bs, no masses Skin: right UE with induration and mild erythema in the tricep area extending into the antecubital area.      Data Reviewed: Basic Metabolic Panel:  Recent Labs Lab 02/19/14 1940 02/20/14 0610 02/21/14 0545 02/22/14 1717 02/23/14 0345 02/25/14 0320  NA 140 140 139 144 143 140  K 3.6* 3.7 4.1 3.2* 4.1 4.1  CL 102 105 106 101 108 106  CO2 24 19 23   --  24 24  GLUCOSE 94 100* 90 110* 106* 100*  BUN 16 16 13 14 16 15   CREATININE 1.10 1.11* 1.02 1.10 1.03 1.06  CALCIUM 9.4 8.8 8.9  --  9.0 9.0   Liver Function Tests:  Recent Labs Lab 02/20/14 0610  AST 16  ALT 12  ALKPHOS 93  BILITOT <0.2*  PROT 6.5  ALBUMIN 3.1*  CBC:  Recent Labs Lab 02/19/14 1940 02/20/14 0610 02/21/14 0545 02/22/14 1704 02/22/14 1717  WBC 8.5 5.8 4.9 8.1  --   NEUTROABS 5.8  --   --  5.6  --   HGB 12.3 11.0* 12.0 12.2 14.6  HCT 37.3 33.3* 36.6 37.1 43.0  MCV 70.1* 70.1* 70.5* 70.0*  --   PLT 213  153 172 210  --     Recent Results (from the past 240 hour(s))  CULTURE, BLOOD (ROUTINE X 2)     Status: None   Collection Time    02/20/14 12:56 AM      Result Value Ref Range Status   Specimen Description BLOOD LEFT ANTECUBITAL   Final   Special Requests BOTTLES DRAWN AEROBIC ONLY 10CC   Final   Culture  Setup Time     Final   Value: 02/20/2014 08:14     Performed at [facility]   Culture     Final   Value:        BLOOD CULTURE RECEIVED NO GROWTH TO DATE CULTURE WILL BE HELD FOR 5 DAYS BEFORE ISSUING A FINAL NEGATIVE REPORT     Performed at [facility]   Report Status PENDING   Incomplete  CULTURE, BLOOD (ROUTINE X 2)     Status: None   Collection Time    02/20/14  1:03 AM      Result Value Ref Range  Status   Specimen Description BLOOD LEFT FOREARM   Final   Special Requests BOTTLES DRAWN AEROBIC ONLY [place]   Final   Culture  Setup Time     Final   Value: 02/20/2014 08:14     Performed at [facility]   Culture     Final   Value:        BLOOD CULTURE RECEIVED NO GROWTH TO DATE CULTURE WILL BE HELD FOR 5 DAYS BEFORE ISSUING A FINAL NEGATIVE REPORT     Performed at [facility]   Report Status PENDING   Incomplete     Scheduled Meds: . enoxaparin (LOVENOX) injection  40 mg Subcutaneous Q24H  . ondansetron  4 mg Intravenous Once  . vancomycin  1,250 mg Intravenous Q12H   Continuous Infusions:   Active Problems:   Cellulitis    Time spent: [place], [place] [phone]  [facility]  If 7PM-7AM, please contact night-coverage at [email or web address], password [facility] 02/25/2014, 10:10 AM  LOS: 3 days

## 2014-02-25 NOTE — Progress Notes (Signed)
ANTIBIOTIC CONSULT NOTE - FOLLOW UP  Pharmacy Consult for Vancomycin Indication: Cellulitis  No Known Allergies  Patient Measurements: Height: 5\' 11"  (180.3 cm) Weight: 235 lb 14.3 oz (107 kg) IBW/kg (Calculated) : 70.8 Adjusted Body Weight:   Vital Signs: Temp: 97.9 F (36.6 C) (04/18 0441) Temp src: Oral (04/18 0441) BP: 130/74 mmHg (04/18 0441) Pulse Rate: 51 (04/18 0441) Intake/Output from previous day: 04/17 0701 - 04/18 0700 In: 500 [IV Piggyback:500] Out: -  Intake/Output from this shift:    Labs:  Recent Labs  02/22/14 1704 02/22/14 1717 02/23/14 0345 02/25/14 0320  WBC 8.1  --   --   --   HGB 12.2 14.6  --   --   PLT 210  --   --   --   CREATININE  --  1.10 1.03 1.06   Estimated Creatinine Clearance: 83.6 ml/min (by C-G formula based on Cr of 1.06). No results found for this basename: VANCOTROUGH, Corlis Leak, VANCORANDOM, [place], GENTPEAK, GENTRANDOM, TOBRATROUGH, TOBRAPEAK, TOBRARND, AMIKACINPEAK, AMIKACINTROU, AMIKACIN,  in the last 72 hours   Microbiology: Recent Results (from the past 720 hour(s))  CULTURE, BLOOD (ROUTINE X 2)     Status: None   Collection Time    02/20/14 12:56 AM      Result Value Ref Range Status   Specimen Description BLOOD LEFT ANTECUBITAL   Final   Special Requests BOTTLES DRAWN AEROBIC ONLY 10CC   Final   Culture  Setup Time     Final   Value: 02/20/2014 08:14     Performed at [facility]   Culture     Final   Value:        BLOOD CULTURE RECEIVED NO GROWTH TO DATE CULTURE WILL BE HELD FOR 5 DAYS BEFORE ISSUING A FINAL NEGATIVE REPORT     Performed at [facility]   Report Status PENDING   Incomplete  CULTURE, BLOOD (ROUTINE X 2)     Status: None   Collection Time    02/20/14  1:03 AM      Result Value Ref Range Status   Specimen Description BLOOD LEFT FOREARM   Final   Special Requests BOTTLES DRAWN AEROBIC ONLY [place]   Final   Culture  Setup Time     Final   Value: 02/20/2014 08:14     Performed  at [facility]   Culture     Final   Value:        BLOOD CULTURE RECEIVED NO GROWTH TO DATE CULTURE WILL BE HELD FOR 5 DAYS BEFORE ISSUING A FINAL NEGATIVE REPORT     Performed at [facility]   Report Status PENDING   Incomplete    Anti-infectives   Start     Dose/Rate Route Frequency Ordered Stop   02/24/14 1300  vancomycin (VANCOCIN) 1,250 mg in sodium chloride 0.9 % 250 mL IVPB     1,250 mg 166.7 mL/hr over 90 Minutes Intravenous Every 12 hours 02/24/14 1216     02/22/14 1900  vancomycin (VANCOCIN) 1,250 mg in sodium chloride 0.9 % 250 mL IVPB  Status:  Discontinued     1,250 mg 166.7 mL/hr over 90 Minutes Intravenous Every 12 hours 02/22/14 1859 02/24/14 1217   02/22/14 1830  vancomycin (VANCOCIN) 1,500 mg in sodium chloride 0.9 % 500 mL IVPB  Status:  Discontinued     1,500 mg 250 mL/hr over 120 Minutes Intravenous  Once 02/22/14 1821 02/23/14 1006      Assessment: 53  year old female, recently admitted 4/12-4/14 for cellulitis, returned 4/15 with worsening redness, pain, swelling of her upper extremity. She was discharged on doxycycline and cephalexin.  AC: Lovenox 40. Upper and lower dopplers negative for DVT. (Pt does have h/o DVT in 2012)  ID: started on Vanc for recurrent cellulitis, note she never got loading dose that was ordered in the ED- SZP completed; no cultures; WBC 8.1, afebrile, patient feeling better. Got PICC due to poor vascular acces. No plan to discharge on Vanco right now. Possibly home by Monday. Could try discharge on Septra  Vanc 4/15>>  Cards: no hx, VSS, no meds  Endo: no hx; no TSH on file; serum glucs fine  GI: GERD  Neuro: no acute issues  Renal: SCr 1.06, CrCl ~65mL/min, lytes WNL  Pulm: RA  Heme/Onc: Hgb 12.2, plts 210, no bleeding noted  PTA Medication Issues: on abx, PRNs and vitamins PTA  Best Practices: Lovenox   Goal of Therapy:  Vancomycin trough level 10-15 mcg/ml  Plan:  -Vancomycin 1250 mg IV  q12h  Mary [name] S. Alford Highland, PharmD, [facility] Clinical Staff Pharmacist Pager [phone]  Mary [name] 02/25/2014,1:53 PM

## 2014-02-26 LAB — CULTURE, BLOOD (ROUTINE X 2)
Culture: NO GROWTH
Culture: NO GROWTH

## 2014-02-26 MED ORDER — MAGNESIUM CITRATE PO SOLN
1.0000 | Freq: Once | ORAL | Status: AC
  Administered 2014-02-26: 1 via ORAL
  Filled 2014-02-26: qty 296

## 2014-02-26 NOTE — Progress Notes (Signed)
[facility] PROGRESS NOTE  Mary [name] NUU:[id number] DOB: June 01, 1961 DOA: 02/22/2014 PCP: No PCP Per Patient   HPI: [name] is a 53 y.o female admitted to the hospital on 4/12 and discharchged on 4/14 for cellulitis.  She was discharged on oral doxy and cephalexin.  She came back o the ED department on 4/15 with worsening redness of the upper extremity and worsening pain and swelling.  She was afebrile and no leukocytosis.     Subjective: Feels much better, complaining about severe constipation since admission.  Assessment/Plan:  Recurrent Cellulitis of RUE  Erythema improved, induration slightly expanded (4/17) Blood cultures NGTD No leukocytosis, afebrile,  DG elbow showed no fracture,  Soft tissue swelling Continue IV vancomycin treatment Plan to keep RUE well elevated to decrease fluid accumulation in the area. Midline placed, continue IV vancomycin for now.  Rule out DVT, SVT Upper and lower venous doppler negative for thrombosis Previous DVT in LUE.  Chronic R. Leg pain Per patient. Pain is left over from previous cancer treatment and a hx of DVT. Pain is described and a sharp pain worse today than yesterday Doppler negative for DVT -showed bakers cyst on right.  On pain medication.  Constipation -Likely related to pain medications, given Lasix and milk of magnesia yesterday without results. -Give magnesium citrate today. If no results can have an enema prior to discharge.  Code Status: full Family Communication: no family at beside this morning Disposition Plan:  Remain inpatient  Consultants:  none  Procedures:  Midline placement.  Antibiotics:  Vancomycin    Objective: Filed Vitals:   02/26/14 0500  BP: 119/63  Pulse: 67  Temp: 98.1 F (36.7 C)  Resp: 18   No intake or output data in the 24 hours ending 02/26/14 1111 Filed Weights   02/22/14 1518 02/22/14 1852 02/25/14 0441  Weight: 107.502 kg (237 lb) 107.502 kg (237 lb) 107 kg  (235 lb 14.3 oz)    Exam:  Physical Exam General: WD, Obese, Appears calm and comfortable  Eyes: normal lids, irises & conjunctiva  ENT: grossly normal hearing, lips & tongue  Neck: no LAD, masses or thyromegaly  Cardiovascular: RRR, no m/r/g. No LE edema.  Respiratory: CTA bilaterally, no w/r/r. Normal respiratory effort.  Abdomen: soft, nt, nd +bs, no masses Skin: right UE with induration and mild erythema in the tricep area extending into the antecubital area.      Data Reviewed: Basic Metabolic Panel:  Recent Labs Lab 02/19/14 1940 02/20/14 0610 02/21/14 0545 02/22/14 1717 02/23/14 0345 02/25/14 0320  NA 140 140 139 144 143 140  K 3.6* 3.7 4.1 3.2* 4.1 4.1  CL 102 105 106 101 108 106  CO2 24 19 23   --  24 24  GLUCOSE 94 100* 90 110* 106* 100*  BUN 16 16 13 14 16 15   CREATININE 1.10 1.11* 1.02 1.10 1.03 1.06  CALCIUM 9.4 8.8 8.9  --  9.0 9.0   Liver Function Tests:  Recent Labs Lab 02/20/14 0610  AST 16  ALT 12  ALKPHOS 93  BILITOT <0.2*  PROT 6.5  ALBUMIN 3.1*  CBC:  Recent Labs Lab 02/19/14 1940 02/20/14 0610 02/21/14 0545 02/22/14 1704 02/22/14 1717  WBC 8.5 5.8 4.9 8.1  --   NEUTROABS 5.8  --   --  5.6  --   HGB 12.3 11.0* 12.0 12.2 14.6  HCT 37.3 33.3* 36.6 37.1 43.0  MCV 70.1* 70.1* 70.5* 70.0*  --   PLT 213 153 172  210  --     Recent Results (from the past 240 hour(s))  CULTURE, BLOOD (ROUTINE X 2)     Status: None   Collection Time    02/20/14 12:56 AM      Result Value Ref Range Status   Specimen Description BLOOD LEFT ANTECUBITAL   Final   Special Requests BOTTLES DRAWN AEROBIC ONLY 10CC   Final   Culture  Setup Time     Final   Value: 02/20/2014 08:14     Performed at [facility]   Culture     Final   Value:        BLOOD CULTURE RECEIVED NO GROWTH TO DATE CULTURE WILL BE HELD FOR 5 DAYS BEFORE ISSUING A FINAL NEGATIVE REPORT     Performed at [facility]   Report Status PENDING   Incomplete  CULTURE, BLOOD  (ROUTINE X 2)     Status: None   Collection Time    02/20/14  1:03 AM      Result Value Ref Range Status   Specimen Description BLOOD LEFT FOREARM   Final   Special Requests BOTTLES DRAWN AEROBIC ONLY [place]   Final   Culture  Setup Time     Final   Value: 02/20/2014 08:14     Performed at [facility]   Culture     Final   Value:        BLOOD CULTURE RECEIVED NO GROWTH TO DATE CULTURE WILL BE HELD FOR 5 DAYS BEFORE ISSUING A FINAL NEGATIVE REPORT     Performed at [facility]   Report Status PENDING   Incomplete     Scheduled Meds: . enoxaparin (LOVENOX) injection  40 mg Subcutaneous Q24H  . ondansetron  4 mg Intravenous Once  . polyethylene glycol  17 g Oral Daily  . vancomycin  1,250 mg Intravenous Q12H   Continuous Infusions:   Active Problems:   Cellulitis    Time spent: [place], [place] [phone]  [facility]  If 7PM-7AM, please contact night-coverage at [email or web address], password [facility] 02/26/2014, 11:11 AM  LOS: 4 days

## 2014-02-27 LAB — IRON AND TIBC
Iron: 45 ug/dL (ref 42–135)
Saturation Ratios: 14 % — ABNORMAL LOW (ref 20–55)
TIBC: 325 ug/dL (ref 250–470)
UIBC: 280 ug/dL (ref 125–400)

## 2014-02-27 LAB — BASIC METABOLIC PANEL
BUN: 12 mg/dL (ref 6–23)
CO2: 23 mEq/L (ref 19–32)
Calcium: 9.3 mg/dL (ref 8.4–10.5)
Chloride: 103 mEq/L (ref 96–112)
Creatinine, Ser: 1.09 mg/dL (ref 0.50–1.10)
GFR calc Af Amer: 66 mL/min — ABNORMAL LOW (ref >90)
GFR calc non Af Amer: 57 mL/min — ABNORMAL LOW (ref >90)
Glucose, Bld: 100 mg/dL — ABNORMAL HIGH (ref 70–99)
Potassium: 4.2 mEq/L (ref 3.7–5.3)
Sodium: 138 mEq/L (ref 137–147)

## 2014-02-27 LAB — CBC
HCT: 35.7 % — ABNORMAL LOW (ref 36.0–46.0)
Hemoglobin: 11.6 g/dL — ABNORMAL LOW (ref 12.0–15.0)
MCH: 22.8 pg — ABNORMAL LOW (ref 26.0–34.0)
MCHC: 32.5 g/dL (ref 30.0–36.0)
MCV: 70.3 fL — ABNORMAL LOW (ref 78.0–100.0)
Platelets: 173 10*3/uL (ref 150–400)
RBC: 5.08 MIL/uL (ref 3.87–5.11)
RDW: 16.9 % — ABNORMAL HIGH (ref 11.5–15.5)
WBC: 6.1 10*3/uL (ref 4.0–10.5)

## 2014-02-27 LAB — FERRITIN: Ferritin: 80 ng/mL (ref 10–291)

## 2014-02-27 MED ORDER — POLYETHYLENE GLYCOL 3350 17 G PO PACK: 17.0000 g | PACK | Freq: Every day | ORAL | Status: DC

## 2014-02-27 MED ORDER — CLINDAMYCIN HCL 300 MG PO CAPS: 300.0000 mg | ORAL_CAPSULE | Freq: Three times a day (TID) | ORAL | Status: AC

## 2014-02-27 MED ORDER — OXYCODONE-ACETAMINOPHEN 5-325 MG PO TABS: 1.0000 | ORAL_TABLET | ORAL | Status: DC | PRN

## 2014-02-27 NOTE — Care Management Note (Signed)
    Page 1 of 1   02/27/2014     5:42:16 PM CARE MANAGEMENT NOTE 02/27/2014  Patient:  Mary [name], Mary [name]   Account Number:  [id number]  Date Initiated:  02/27/2014  Documentation initiated by:  Tomi Bamberger  Subjective/Objective Assessment:   dx cellulits  admit- from home.     Action/Plan:   Anticipated DC Date:  02/27/2014   Anticipated DC Plan:  [place]  CM consult      Choice offered to / List presented to:             Status of service:  Completed, signed off Medicare Important Message given?   (If response is "NO", the following Medicare IM given date fields will be blank) Date Medicare IM given:   Date Additional Medicare IM given:    Discharge Disposition:  HOME/SELF CARE  Per UR Regulation:  Reviewed for med. necessity/level of care/duration of stay  If discussed at [place] of Stay Meetings, dates discussed:    Comments:

## 2014-02-27 NOTE — Discharge Summary (Signed)
Physician Discharge Summary  Mary [name] TIR:[id number] DOB: 11-Nov-1960 DOA: 02/22/2014  PCP: No PCP Per Patient  Admit date: 02/22/2014 Discharge date: 02/27/2014  Time spent: 40 minutes  Recommendations for Outpatient Follow-up:  1. Discharge to home 2. Follow up or Establish care with a primary care provider in one week to insure successful antibiotic treatment.  Discharge Diagnoses:  Principal Problem:   Right arm cellulitis Active Problems:   Iron deficiency anemia, unspecified   Cellulitis   history of DVT (deep venous thrombosis)   GERD (gastroesophageal reflux disease)   Discharge Condition: stable  Diet recommendation: low sodium heart healthy  Filed Weights   02/22/14 1518 02/22/14 1852 02/25/14 0441  Weight: 107.502 kg (237 lb) 107.502 kg (237 lb) 107 kg (235 lb 14.3 oz)    History of present illness:  Mary [name] is a 53 y.o. female admitted to the hospital on 4/12 and discharged on 4/14 for cellulitis on oral doxy and keflex, comes back on 4/15 for worsening redness of the upper extremity with worsening pain and swelling and low grade fever on 4/14 and 4/15 . She reports she took three tablets of doxycycline, but her itching and pain increased and her arm was red and swollen. On arrival to ED, she was found to be afebrile and no leukocytosis. She was referred to medical service for admission for the management of recurrent cellulitis.    Hospital Course:   Recurrent Cellulitis of RUE   Blood cultures NGTD  No leukocytosis, afebrile,  DG elbow showed no fracture, Soft tissue swelling  kept RUE well elevated to decrease fluid accumulation in the area.  Midline placed. Erythema and edema improvement with IV vancomycin significantly reduced on 02/27/14.   Discharge on clindamycin for 5 more days, Percocet for pain 20 pills prescribed.   Rule out DVT, SVT  Upper and lower venous doppler negative for thrombosis  Previous DVT in LUE Was on lovenox for DVT  prophylaxis .    Chronic R. Leg pain  Per patient. Pain is left over from previous cancer treatment and a hx of DVT.  Pain is described and a sharp pain worse today than yesterday  Doppler negative for DVT -showed bakers cyst on right.   Constipation  -Likely related to pain medications, given Lasix and milk of magnesia yesterday with results (1 BM 4/19-pm).  -Discharge on daily MiraLax.  Consultants:  none  Procedures:  Midline placement.  Antibiotics:  Vancomycin - IV   Discharge Exam: Filed Vitals:   02/27/14 0517  BP: 114/70  Pulse: 55  Temp: 97.9 F (36.6 C)  Resp: 18    Physical Exam  Nursing note and vitals reviewed. Constitutional: She is oriented to person, place, and time. She appears well-developed and well-nourished. No distress.  HENT:  Head: Normocephalic and atraumatic.  Eyes: Pupils are equal, round, and reactive to light.  Neck: No JVD present.  Cardiovascular: Normal rate, regular rhythm, normal heart sounds and intact distal pulses.  Exam reveals no friction rub.   No murmur heard. Respiratory: Effort normal and breath sounds normal. No stridor. No respiratory distress. She has no wheezes. She exhibits no tenderness.  GI: Soft. Bowel sounds are normal. She exhibits no distension. There is no tenderness. There is no rebound.  Musculoskeletal: Normal range of motion. She exhibits no tenderness.  Neurological: She is alert and oriented to person, place, and time.  Skin: Skin is warm, dry and intact. No rash noted. She is not diaphoretic. No erythema.  No pallor.     Psychiatric: She has a normal mood and affect. Her speech is normal and behavior is normal. Judgment and thought content normal. Cognition and memory are normal.           Discharge Orders   Future Appointments Provider Department Dept Phone   04/24/2014 8:00 AM [facility] [place] [facility] [phone]   04/24/2014 9:00 AM [facility] [phone]   Liquids only 4 hours prior to your exam. Any medications can be taken as usual. Please arrive 15 min prior to your scheduled exam time.   05/01/2014 8:30 AM Ladell Pier, MD [facility] [phone]   Future Orders Complete By Expires   Call MD for:  redness, tenderness, or signs of infection (pain, swelling, redness, odor or green/yellow discharge around incision site)  As directed    Diet - low sodium heart healthy  As directed    Increase activity slowly  As directed        Medication List    STOP taking these medications       cephALEXin 500 MG capsule  Commonly known as:  KEFLEX     doxycycline 100 MG capsule  Commonly known as:  VIBRAMYCIN     RA GUMMY VITAMINS & MINERALS PO      TAKE these medications       Biotin 5000 MCG Caps  Take 5,000 mcg by mouth every morning.     clindamycin 300 MG capsule  Commonly known as:  CLEOCIN  Take 1 capsule (300 mg total) by mouth 3 (three) times daily.     docusate sodium 100 MG capsule  Commonly known as:  COLACE  Take 100 mg by mouth 2 (two) times daily as needed for mild constipation.     hydroxypropyl methylcellulose 2.5 % ophthalmic solution  Commonly known as:  ISOPTO TEARS  Place 1 drop into both eyes daily as needed for dry eyes.     OVER THE COUNTER MEDICATION  Take 1 capsule by mouth 2 (two) times daily. Mane Choice - vitamin for hair     oxyCODONE-acetaminophen 5-325 MG per tablet  Commonly known as:  PERCOCET/ROXICET  Take 1-2 tablets by mouth every 4 (four) hours as needed for severe pain.     oxyCODONE-acetaminophen 5-325 MG per tablet  Commonly known as:  ROXICET  Take 1 tablet by mouth every 4 (four) hours as needed.     polyethylene glycol packet  Commonly known as:  MIRALAX / GLYCOLAX  Take 17 g by mouth daily.       No Known Allergies Follow-up Information   Follow up with No PCP Per Patient In 2 weeks.   Specialty:  General  Practice       The results of significant diagnostics from this hospitalization (including imaging, microbiology, ancillary and laboratory) are listed below for reference.    Significant Diagnostic Studies: Dg Elbow Complete Right  02/19/2014   CLINICAL DATA:  Swelling, pain, redness, suspect cellulitis, evaluate joint involvement  EXAM: RIGHT ELBOW - COMPLETE 3+ VIEW  COMPARISON:  None.  FINDINGS: No displaced fracture or elbow joint effusion. Joint spaces are preserved. No erosions. There is apparent mild diffuse soft tissue swelling about the medial aspect of the elbow. No radiopaque foreign body. No subcutaneous emphysema. No discrete area of osteolysis to suggest osteomyelitis.  IMPRESSION: Soft tissue swelling about the elbow without associated fracture, dislocation or radiopaque  foreign body.   Electronically Signed   By: Sandi Mariscal M.D.   On: 02/19/2014 23:24    Microbiology: Recent Results (from the past 240 hour(s))  CULTURE, BLOOD (ROUTINE X 2)     Status: None   Collection Time    02/20/14 12:56 AM      Result Value Ref Range Status   Specimen Description BLOOD LEFT ANTECUBITAL   Final   Special Requests BOTTLES DRAWN AEROBIC ONLY 10CC   Final   Culture  Setup Time     Final   Value: 02/20/2014 08:14     Performed at [facility]   Culture     Final   Value: NO GROWTH 5 DAYS     Performed at [facility]   Report Status 02/26/2014 FINAL   Final  CULTURE, BLOOD (ROUTINE X 2)     Status: None   Collection Time    02/20/14  1:03 AM      Result Value Ref Range Status   Specimen Description BLOOD LEFT FOREARM   Final   Special Requests BOTTLES DRAWN AEROBIC ONLY [place]   Final   Culture  Setup Time     Final   Value: 02/20/2014 08:14     Performed at [facility]   Culture     Final   Value: NO GROWTH 5 DAYS     Performed at [facility]   Report Status 02/26/2014 FINAL   Final     Labs: Basic Metabolic Panel:  Recent Labs Lab  02/21/14 0545 02/22/14 1717 02/23/14 0345 02/25/14 0320 02/27/14 0500  NA 139 144 143 140 138  K 4.1 3.2* 4.1 4.1 4.2  CL 106 101 108 106 103  CO2 23  --  24 24 23   GLUCOSE 90 110* 106* 100* 100*  BUN 13 14 16 15 12   CREATININE 1.02 1.10 1.03 1.06 1.09  CALCIUM 8.9  --  9.0 9.0 9.3   CBC:  Recent Labs Lab 02/21/14 0545 02/22/14 1704 02/22/14 1717 02/27/14 0500  WBC 4.9 8.1  --  6.1  NEUTROABS  --  5.6  --   --   HGB 12.0 12.2 14.6 11.6*  HCT 36.6 37.1 43.0 35.7*  MCV 70.5* 70.0*  --  70.3*  PLT 172 210  --  173     Signed:  Azzie Glatter, Student-PA    [facility] 02/27/2014, 12:12 PM    Addendum  Patient seen and examined, chart and data base reviewed.  I agree with the above assessment and plan.  For full details please see Mr. Denzil Hughes note.  The above note is reviewed an amended by me.   Birdie Hopes, MD [facility] Pager: [phone] 02/27/2014, 12:14 PM

## 2014-02-27 NOTE — Progress Notes (Signed)
NURSING PROGRESS NOTE  Mary [name] [id number] Discharge Data: 02/27/2014 3:21 PM Attending Provider: Verlee Monte, MD PCP:No PCP Per Patient     Annett Gula to be D/C'd Home per MD order.  Discussed with the patient the After Visit Summary and all questions fully answered. Clarified questions that the patient had about cellulitis and reviewed cellulitis handout with all questions fully answered. All IV's discontinued with no bleeding noted. All belongings returned to patient for patient to take home.   Last Vital Signs:  Blood pressure 114/70, pulse 55, temperature 97.9 F (36.6 C), temperature source Oral, resp. rate 18, height 5\' 11"  (1.803 m), weight 107 kg (235 lb 14.3 oz), SpO2 97.00%.  Discharge Medication List   Medication List    STOP taking these medications       cephALEXin 500 MG capsule  Commonly known as:  KEFLEX     doxycycline 100 MG capsule  Commonly known as:  VIBRAMYCIN     RA GUMMY VITAMINS & MINERALS PO      TAKE these medications       Biotin 5000 MCG Caps  Take 5,000 mcg by mouth every morning.     clindamycin 300 MG capsule  Commonly known as:  CLEOCIN  Take 1 capsule (300 mg total) by mouth 3 (three) times daily.     docusate sodium 100 MG capsule  Commonly known as:  COLACE  Take 100 mg by mouth 2 (two) times daily as needed for mild constipation.     hydroxypropyl methylcellulose 2.5 % ophthalmic solution  Commonly known as:  ISOPTO TEARS  Place 1 drop into both eyes daily as needed for dry eyes.     OVER THE COUNTER MEDICATION  Take 1 capsule by mouth 2 (two) times daily. Mane Choice - vitamin for hair     oxyCODONE-acetaminophen 5-325 MG per tablet  Commonly known as:  PERCOCET/ROXICET  Take 1-2 tablets by mouth every 4 (four) hours as needed for severe pain.     oxyCODONE-acetaminophen 5-325 MG per tablet  Commonly known as:  ROXICET  Take 1 tablet by mouth every 4 (four) hours as needed.     polyethylene glycol packet   Commonly known as:  MIRALAX / GLYCOLAX  Take 17 g by mouth daily.         Wallie Renshaw, RN

## 2014-04-14 ENCOUNTER — Telehealth: Payer: Self-pay | Admitting: Oncology

## 2014-04-14 NOTE — Telephone Encounter (Signed)
, °

## 2014-04-24 ENCOUNTER — Encounter (HOSPITAL_COMMUNITY): Payer: Self-pay

## 2014-04-24 ENCOUNTER — Ambulatory Visit (HOSPITAL_COMMUNITY)
Admission: RE | Admit: 2014-04-24 | Discharge: 2014-04-24 | Disposition: A | Discharge status: Home / Self Care | Payer: BC Managed Care – PPO | Source: Ambulatory Visit | Attending: Nurse Practitioner | Admitting: Nurse Practitioner

## 2014-04-24 ENCOUNTER — Other Ambulatory Visit (HOSPITAL_BASED_OUTPATIENT_CLINIC_OR_DEPARTMENT_OTHER): Payer: BC Managed Care – PPO

## 2014-04-24 DIAGNOSIS — D509 Iron deficiency anemia, unspecified: Secondary | ICD-10-CM

## 2014-04-24 DIAGNOSIS — C189 Malignant neoplasm of colon, unspecified: Secondary | ICD-10-CM | POA: Insufficient documentation

## 2014-04-24 DIAGNOSIS — Z9221 Personal history of antineoplastic chemotherapy: Secondary | ICD-10-CM | POA: Insufficient documentation

## 2014-04-24 LAB — COMPREHENSIVE METABOLIC PANEL (CC13)
ALT: 15 U/L (ref 0–55)
AST: 14 U/L (ref 5–34)
Albumin: 3.6 g/dL (ref 3.5–5.0)
Alkaline Phosphatase: 93 U/L (ref 40–150)
Anion Gap: 7 mEq/L (ref 3–11)
BUN: 10 mg/dL (ref 7.0–26.0)
CO2: 26 mEq/L (ref 22–29)
Calcium: 9.1 mg/dL (ref 8.4–10.4)
Chloride: 104 mEq/L (ref 98–109)
Creatinine: 1 mg/dL (ref 0.6–1.1)
Glucose: 104 mg/dl (ref 70–140)
Potassium: 3.8 mEq/L (ref 3.5–5.1)
Sodium: 137 mEq/L (ref 136–145)
Total Bilirubin: 0.7 mg/dL (ref 0.20–1.20)
Total Protein: 7.1 g/dL (ref 6.4–8.3)

## 2014-04-24 LAB — CBC WITH DIFFERENTIAL/PLATELET
BASO%: 1.1 % (ref 0.0–2.0)
Basophils Absolute: 0.1 10*3/uL (ref 0.0–0.1)
EOS%: 1.6 % (ref 0.0–7.0)
Eosinophils Absolute: 0.1 10*3/uL (ref 0.0–0.5)
HCT: 38.6 % (ref 34.8–46.6)
HGB: 11.9 g/dL (ref 11.6–15.9)
LYMPH%: 25.3 % (ref 14.0–49.7)
MCH: 22.3 pg — ABNORMAL LOW (ref 25.1–34.0)
MCHC: 30.9 g/dL — ABNORMAL LOW (ref 31.5–36.0)
MCV: 72.2 fL — ABNORMAL LOW (ref 79.5–101.0)
MONO#: 0.6 10*3/uL (ref 0.1–0.9)
MONO%: 9 % (ref 0.0–14.0)
NEUT#: 4.5 10*3/uL (ref 1.5–6.5)
NEUT%: 63 % (ref 38.4–76.8)
Platelets: 196 10*3/uL (ref 145–400)
RBC: 5.34 10*6/uL (ref 3.70–5.45)
RDW: 17.5 % — ABNORMAL HIGH (ref 11.2–14.5)
WBC: 7.2 10*3/uL (ref 3.9–10.3)
lymph#: 1.8 10*3/uL (ref 0.9–3.3)

## 2014-04-24 LAB — CEA: CEA: 0.7 ng/mL (ref 0.0–5.0)

## 2014-04-24 MED ORDER — IOHEXOL 300 MG/ML  SOLN
50.0000 mL | Freq: Once | INTRAMUSCULAR | Status: AC | PRN
  Administered 2014-04-24: 50 mL via ORAL

## 2014-04-24 MED ORDER — IOHEXOL 300 MG/ML  SOLN
100.0000 mL | Freq: Once | INTRAMUSCULAR | Status: AC | PRN
  Administered 2014-04-24: 100 mL via INTRAVENOUS

## 2014-04-25 ENCOUNTER — Telehealth: Payer: Self-pay | Admitting: *Deleted

## 2014-04-25 NOTE — Telephone Encounter (Signed)
Left Message for patient to call back regarding CT results.

## 2014-04-25 NOTE — Telephone Encounter (Signed)
Message copied by Norma Fredrickson on Tue Apr 25, 2014  4:00 PM ------      Message from: Tania Ade      Created: Tue Apr 25, 2014  3:27 PM                   ----- Message -----         From: Ladell Pier, MD         Sent: 04/24/2014   9:14 PM           To: Tania Ade, RN, Ludwig Lean, RN, #            Please call patient, CT is negative for recurrence of cancer ------

## 2014-04-26 ENCOUNTER — Telehealth: Payer: Self-pay | Admitting: *Deleted

## 2014-04-26 NOTE — Telephone Encounter (Signed)
Called and informed patient that CT is negative for recurrence of cancer. Per Dr. Benay Spice.  Patient verbalized understanding.

## 2014-04-26 NOTE — Telephone Encounter (Signed)
Message copied by Norma Fredrickson on Wed Apr 26, 2014  9:39 AM ------      Message from: Tania Ade      Created: Tue Apr 25, 2014  3:27 PM                   ----- Message -----         From: Ladell Pier, MD         Sent: 04/24/2014   9:14 PM           To: Tania Ade, RN, Ludwig Lean, RN, #            Please call patient, CT is negative for recurrence of cancer ------

## 2014-05-01 ENCOUNTER — Ambulatory Visit: Payer: BC Managed Care – PPO | Admitting: Oncology

## 2014-05-09 ENCOUNTER — Telehealth: Payer: Self-pay | Admitting: Oncology

## 2014-05-09 ENCOUNTER — Ambulatory Visit (HOSPITAL_BASED_OUTPATIENT_CLINIC_OR_DEPARTMENT_OTHER): Payer: BC Managed Care – PPO | Admitting: Oncology

## 2014-05-09 VITALS — BP 128/83 | HR 75 | Temp 97.5°F | Resp 20 | Ht 71.0 in | Wt 241.8 lb

## 2014-05-09 DIAGNOSIS — C189 Malignant neoplasm of colon, unspecified: Secondary | ICD-10-CM

## 2014-05-09 DIAGNOSIS — M79609 Pain in unspecified limb: Secondary | ICD-10-CM

## 2014-05-09 MED ORDER — OXYCODONE-ACETAMINOPHEN 5-325 MG PO TABS: 1.0000 | ORAL_TABLET | Freq: Four times a day (QID) | ORAL | Status: DC | PRN

## 2014-05-09 NOTE — Telephone Encounter (Signed)
Gave pt appt for Dr Collene Mares om 7/28 0830 and Dr French Ana today. pt aware of both appt , gave referral to HIM, gave pt appt for december and January for lab and MD
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
## 2014-05-10 ENCOUNTER — Telehealth: Payer: Self-pay | Admitting: Oncology

## 2014-05-10 NOTE — Telephone Encounter (Signed)
Faxed pt medical records to [phone]

## 2014-05-10 NOTE — Telephone Encounter (Signed)
Faxed pt medical records to [phone]

## 2014-05-29 ENCOUNTER — Encounter: Payer: BC Managed Care – PPO | Admitting: Family Medicine

## 2014-05-30 ENCOUNTER — Encounter: Payer: BC Managed Care – PPO | Admitting: Sports Medicine

## 2014-06-02 ENCOUNTER — Ambulatory Visit (INDEPENDENT_AMBULATORY_CARE_PROVIDER_SITE_OTHER): Payer: BC Managed Care – PPO | Admitting: Family Medicine

## 2014-06-02 ENCOUNTER — Encounter: Payer: Self-pay | Admitting: Family Medicine

## 2014-06-02 VITALS — BP 155/97 | HR 69 | Ht 69.0 in | Wt 205.0 lb

## 2014-06-02 DIAGNOSIS — M79672 Pain in left foot: Secondary | ICD-10-CM

## 2014-06-02 DIAGNOSIS — E669 Obesity, unspecified: Secondary | ICD-10-CM

## 2014-06-02 DIAGNOSIS — T451X5A Adverse effect of antineoplastic and immunosuppressive drugs, initial encounter: Secondary | ICD-10-CM

## 2014-06-02 DIAGNOSIS — G622 Polyneuropathy due to other toxic agents: Secondary | ICD-10-CM

## 2014-06-02 DIAGNOSIS — M79671 Pain in right foot: Secondary | ICD-10-CM

## 2014-06-02 DIAGNOSIS — M79609 Pain in unspecified limb: Secondary | ICD-10-CM

## 2014-06-02 DIAGNOSIS — G620 Drug-induced polyneuropathy: Secondary | ICD-10-CM

## 2014-06-02 DIAGNOSIS — C189 Malignant neoplasm of colon, unspecified: Secondary | ICD-10-CM

## 2014-06-02 NOTE — Progress Notes (Signed)
Patient ID: Mary [name], female   DOB: May 19, 1961, 53 y.o.   MRN: [id number]  Mary [name] - 53 y.o. female MRN [id number]  Date of birth: [birth year]    SUBJECTIVE:     Bilateral foot and ankle pain. Also has bilateral foot neuropathy secondary to chemotherapy for colon cancer. Had a corticosteroid injection in her right subtalar joint. Position thought she would benefit from custom molded orthotics that she has chronic foot pain as well as her neuropathy. ROS:     . No fever, sweats, chills. No foot erythema.  PERTINENT  PMH / PSH FH / / SH:  Past Medical, Surgical, Social, and Family History Reviewed & Updated in the EMR.  Pertinent findings include:  History of colon cancer status post chemotherapy. History of DVT. Obesity.No foot injury or surgery  OBJECTIVE: BP 155/97  Pulse 69  Ht 5\' 9"  (1.753 m)  Wt 205 lb (92.987 kg)  BMI 30.26 kg/m2  Physical Exam:  Vital signs are reviewed. Vital signs are reviewed GENERAL: Well-developed overweight female no acute distress  ANKLES: Bilaterally full range of motion eversion, inversion,  plantarflexion dorsiflexion. Normal strength all planes ankle FEET: Bilaterally severe pes planus with medial foot collapse on the right. slight loss transverse arch B.  SKIN. callous formation right great toe distally NEURO: Loss of  soft touch sensation bilateral ankle and foot. Poor proprioception right > left feet. GAIT: difficulty with foot plant on right (secondary to loss proprioception?) Slight shortening stride length right. Increased width of gait and question of beginning of right sided foot drop (very slight) VASC: feet normal cap refill, normal DP pulses 2+B=  Patient was fitted for a : standard, cushioned, semi-rigid orthotic. The orthotic was heated, placed on the orthotic stand. The patient was positioned in subtalar neutral position and 10 degrees of ankle dorsiflexion in a weight bearing stance on the heated orthotic blank After completion  of molding, a stable base was applied to the orthotic blank. The blank was ground to a stable position for weight bearing. Blank: red Base:white F4 Posting:right scaphoid pad  Face to face time spent in evaluation, measurement and manufacture of custom molded orthotic was 40 minutes.   ASSESSMENT & PLAN:  See problem based charting & AVS for pt instructions.

## 2014-06-05 ENCOUNTER — Encounter: Payer: Self-pay | Admitting: Family Medicine

## 2014-06-05 DIAGNOSIS — M79671 Pain in right foot: Secondary | ICD-10-CM | POA: Insufficient documentation

## 2014-06-05 DIAGNOSIS — E669 Obesity, unspecified: Secondary | ICD-10-CM | POA: Insufficient documentation

## 2014-06-05 DIAGNOSIS — T451X5A Adverse effect of antineoplastic and immunosuppressive drugs, initial encounter: Principal | ICD-10-CM

## 2014-06-05 DIAGNOSIS — M79672 Pain in left foot: Secondary | ICD-10-CM

## 2014-06-05 DIAGNOSIS — G620 Drug-induced polyneuropathy: Secondary | ICD-10-CM | POA: Insufficient documentation

## 2014-08-24 ENCOUNTER — Ambulatory Visit: Payer: BC Managed Care – PPO | Admitting: Family

## 2014-09-06 ENCOUNTER — Other Ambulatory Visit: Payer: Self-pay | Admitting: Nurse Practitioner

## 2014-09-14 ENCOUNTER — Other Ambulatory Visit (INDEPENDENT_AMBULATORY_CARE_PROVIDER_SITE_OTHER): Payer: BC Managed Care – PPO

## 2014-09-14 ENCOUNTER — Other Ambulatory Visit: Payer: Self-pay | Admitting: Geriatric Medicine

## 2014-09-14 ENCOUNTER — Encounter: Payer: Self-pay | Admitting: Internal Medicine

## 2014-09-14 ENCOUNTER — Ambulatory Visit (INDEPENDENT_AMBULATORY_CARE_PROVIDER_SITE_OTHER): Payer: BC Managed Care – PPO | Admitting: Internal Medicine

## 2014-09-14 DIAGNOSIS — M79672 Pain in left foot: Secondary | ICD-10-CM

## 2014-09-14 DIAGNOSIS — E669 Obesity, unspecified: Secondary | ICD-10-CM

## 2014-09-14 DIAGNOSIS — M79671 Pain in right foot: Secondary | ICD-10-CM

## 2014-09-14 DIAGNOSIS — C189 Malignant neoplasm of colon, unspecified: Secondary | ICD-10-CM

## 2014-09-14 DIAGNOSIS — I82409 Acute embolism and thrombosis of unspecified deep veins of unspecified lower extremity: Secondary | ICD-10-CM

## 2014-09-14 LAB — LIPID PANEL
Cholesterol: 187 mg/dL (ref 0–200)
HDL: 40.4 mg/dL (ref >39.00)
LDL Cholesterol: 123 mg/dL — ABNORMAL HIGH (ref 0–99)
NonHDL: 146.6
Total CHOL/HDL Ratio: 5
Triglycerides: 117 mg/dL (ref 0.0–149.0)
VLDL: 23.4 mg/dL (ref 0.0–40.0)

## 2014-09-14 LAB — HEMOGLOBIN A1C: Hgb A1c MFr Bld: 6.6 % — ABNORMAL HIGH (ref 4.6–6.5)

## 2014-09-14 MED ORDER — LIDOCAINE 5 % EX PTCH: 1.0000 | MEDICATED_PATCH | CUTANEOUS | Status: DC

## 2014-09-14 MED ORDER — DICLOFENAC SODIUM 1 % TD GEL: 2.0000 g | Freq: Four times a day (QID) | TRANSDERMAL | Status: DC

## 2014-09-14 NOTE — Progress Notes (Signed)
Pre visit review using our clinic review tool, if applicable. No additional management support is needed unless otherwise documented below in the visit note. 

## 2014-09-14 NOTE — Assessment & Plan Note (Signed)
Advised patient that her obesity is likely causing some of her foot pain and that weight loss may be a good option for being in less pain.

## 2014-09-14 NOTE — Assessment & Plan Note (Signed)
Per patient she had repeat colonoscopy about one week ago with Dr. Collene Mares that showed no evidence of recurrence. We'll request those records. Do not feel that the foot pain is related to neuropathy.

## 2014-09-14 NOTE — Progress Notes (Signed)
   Subjective:    Patient ID: Mary [name], female    DOB: [birth year], 53 y.o.   MRN: [id number]  HPI The patient is a 53 year old female who comes in today to establish care. She does have history of colon cancer in 2012 associated with dvt during cancer treatment. She is currently not taking any anticoagulation. She denies chest pains, shortness of breath. She denies abdominal pains, constipation, diarrhea, blood in stool. She did have colonoscopy last year however was noted to be poor prep. She is felt to be in recovery at this time status post chemotherapy and surgery. She had some peripheral neuropathy from chemotherapy however at this time mostly is resolved. She does still have ongoing foot pain especially in her right foot. She has been seeing orthopedics for it and they feel that the part of it is arthritis and she has been taking Percocet for it with good relief. She states she has tried gabapentin and there can the past without much relief. She is not exercising at this time and has gained weight since her cancer treatment in 2012.  Review of Systems  Constitutional: Negative for fever, activity change, appetite change, fatigue and unexpected weight change.  HENT: Negative.   Respiratory: Negative for cough, chest tightness, shortness of breath and wheezing.   Cardiovascular: Negative for chest pain, palpitations and leg swelling.  Gastrointestinal: Negative for abdominal pain, diarrhea, constipation, blood in stool and abdominal distention.  Musculoskeletal: Positive for arthralgias. Negative for myalgias, back pain and gait problem.  Skin: Negative.   Neurological: Negative for dizziness, weakness, light-headedness and headaches.  Psychiatric/Behavioral: Negative.       Objective:   Physical Exam  Constitutional: She is oriented to person, place, and time. She appears well-developed and well-nourished. No distress.  HENT:  Head: Normocephalic and atraumatic.  Eyes: EOM are  normal.  Neck: Normal range of motion.  Cardiovascular: Normal rate and regular rhythm.   Pulmonary/Chest: Effort normal and breath sounds normal. No respiratory distress. She has no wheezes. She has no rales.  Abdominal: Soft. Bowel sounds are normal. She exhibits no distension. There is no tenderness. There is no rebound.  Musculoskeletal: She exhibits tenderness.  Tenderness on the plantar surface of the foot as well as the ankle on the right side greater than left  Neurological: She is alert and oriented to person, place, and time. Coordination normal.  Skin: Skin is warm and dry.   Filed Vitals:   09/14/14 1044  BP: 138/76  Pulse: 79  Temp: 98.4 F (36.9 C)  TempSrc: Oral  Resp: 16  Height: 5\' 11"  (1.803 m)  Weight: 254 lb 12.8 oz (115.577 kg)  SpO2: 98%      Assessment & Plan:

## 2014-09-14 NOTE — Patient Instructions (Addendum)
We will have you try voltaren gel on your foot pain.you can apply it up to 3 times a day exactly where the pain is. Hopefully the foot brace will help with the pain as it may not be coming from neuropathy.  We will check your blood work today and let you know about the results. Come back in about 3-6 months for a checkup. If you're having problems or questions before then please feel free to call us.  Keep working on increasing your exercise and losing little bit of weight to take some of the pressure and stress off your feet.  Exercise to Lose Weight Exercise and a healthy diet may help you lose weight. Your doctor may suggest specific exercises. EXERCISE IDEAS AND TIPS  Choose low-cost things you enjoy doing, such as walking, bicycling, or exercising to workout videos.  Take stairs instead of the elevator.  Walk during your lunch break.  Park your car further away from work or school.  Go to a gym or an exercise class.  Start with 5 to 10 minutes of exercise each day. Build up to 30 minutes of exercise 4 to 6 days a week.  Wear shoes with good support and comfortable clothes.  Stretch before and after working out.  Work out until you breathe harder and your heart beats faster.  Drink extra water when you exercise.  Do not do so much that you hurt yourself, feel dizzy, or get very short of breath. Exercises that burn about 150 calories:  Running 1  miles in 15 minutes.  Playing volleyball for 45 to 60 minutes.  Washing and waxing a car for 45 to 60 minutes.  Playing touch football for 45 minutes.  Walking 1  miles in 35 minutes.  Pushing a stroller 1  miles in 30 minutes.  Playing basketball for 30 minutes.  Raking leaves for 30 minutes.  Bicycling 5 miles in 30 minutes.  Walking 2 miles in 30 minutes.  Dancing for 30 minutes.  Shoveling snow for 15 minutes.  Swimming laps for 20 minutes.  Walking up stairs for 15 minutes.  Bicycling 4 miles in 15  minutes.  Gardening for 30 to 45 minutes.  Jumping rope for 15 minutes.  Washing windows or floors for 45 to 60 minutes. Document Released: 11/29/2010 Document Revised: 01/19/2012 Document Reviewed: 11/29/2010 [facility] Patient Information 2015 [place], [place]. This information is not intended to replace advice given to you by your health care provider. Make sure you discuss any questions you have with your health care provider.

## 2014-09-14 NOTE — Assessment & Plan Note (Signed)
Patient not currently on anticoagulation and blood clot in the setting of active cancer treatment. Would avoid hormonal therapy and advised her to avoid smoking.

## 2014-09-14 NOTE — Assessment & Plan Note (Signed)
The patient will be picking up of her brace on Monday which was constantly made for her. Hopefully this will help with some of her discomfort. She only gets discomfort with standing for long periods of time. Will trial Voltaren gel for her pain to see if this helps. Request for Percocet denied at today's visit.

## 2014-09-19 ENCOUNTER — Telehealth: Payer: Self-pay | Admitting: Internal Medicine

## 2014-09-19 NOTE — Telephone Encounter (Signed)
She should try the Voltaren gel that I sent in for her at our visit.

## 2014-09-19 NOTE — Telephone Encounter (Signed)
Please advise, thanks.

## 2014-09-19 NOTE — Telephone Encounter (Signed)
Patient ask if she can get some pain meds until the insurance company approve her prior authorization for her medication pain patch. She stated she is in a lot of pain Please advised.

## 2014-09-20 ENCOUNTER — Telehealth: Payer: Self-pay

## 2014-09-20 NOTE — Telephone Encounter (Signed)
PA for lidocaine patch sent via cover my meds.

## 2014-09-20 NOTE — Telephone Encounter (Signed)
Pa for lidocaine approved.

## 2014-09-20 NOTE — Telephone Encounter (Signed)
Left message for patient to call me back. 

## 2014-09-26 ENCOUNTER — Ambulatory Visit (INDEPENDENT_AMBULATORY_CARE_PROVIDER_SITE_OTHER): Payer: BC Managed Care – PPO | Admitting: Pulmonary Disease

## 2014-09-26 ENCOUNTER — Telehealth: Payer: Self-pay | Admitting: Internal Medicine

## 2014-09-26 ENCOUNTER — Encounter: Payer: Self-pay | Admitting: Pulmonary Disease

## 2014-09-26 VITALS — BP 130/92 | HR 74 | Ht 71.0 in | Wt 254.6 lb

## 2014-09-26 DIAGNOSIS — G4733 Obstructive sleep apnea (adult) (pediatric): Secondary | ICD-10-CM

## 2014-09-26 NOTE — Telephone Encounter (Signed)
Do you want to prescribe percocet to this patient? Please advise, thanks.

## 2014-09-26 NOTE — Telephone Encounter (Signed)
Pt came by office requesting Rx for PERCOCET. Pt states she discussed trying patches and was told if the lidocaine patches did not work she could receive PERCOCET. Pt states that she tried patches last week and they do not work. Please review this request and contact pt and advise.

## 2014-09-26 NOTE — Assessment & Plan Note (Signed)
Given excessive daytime somnolence, narrow pharyngeal exam, witnessed apneas & loud snoring, obstructive sleep apnea is very likely & an overnight polysomnogram will be scheduled as a home study. The pathophysiology of obstructive sleep apnea , it's cardiovascular consequences & modes of treatment including CPAP were discused with the patient in detail & they evidenced understanding. Based on the results of the home study, we will undertake a C-peptide titration study at our sleep center

## 2014-09-26 NOTE — Patient Instructions (Signed)
You probably have obstructive sleep apnea  Home sleep study

## 2014-09-26 NOTE — Progress Notes (Signed)
Subjective:    Patient ID: Mary [name], female    DOB: [birth year], 53 y.o.   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 She reports excessive daytime somnolence.this is to the point where she is falling asleep at a light on one occasion. Epworth sleepiness score is 20. Husband has also noted loud snoring throughout the night with witnessed apneas. Snoring is worse on her back. She works 10-12 hours every shift and has every third week enough. Bedtime is around 9 PM, sleep latency is about an hour, she sleeps on her side with 2 pillows, she reports awakenings every hour including bathroom visits, she is out of bed by 3 AM with occasional headaches and dryness of mouth. On her days off she will stay in bed until 7 AM. She has gained 20 pounds over the last 2 years There is no history suggestive of cataplexy, sleep paralysis or parasomnias  She is a colon cancer survivor and has developed peripheral neuropathy requiring a right foot brace.   Past Medical History  Diagnosis Date  . Blood transfusion   . History of migraine headaches   . iron deficiency anemia   . Multiple hemangiomas     Liver  . Renal cysts, acquired, bilateral   . Pneumothorax 07/19/11    Bilateral  . Arm DVT (deep venous thromboembolism), acute 07/04/11    r/t PAC  . 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 GERD (gastroesophageal reflux disease)   . Incisional hernia   . Adenocarcinoma sigmoid colon 05/12/11    Stage III(pT2pN2b) descending sigmoid     Past Surgical History  Procedure Laterality Date  . Abdominal hysterectomy    . Colon surgery  05/17/11    Partial colectomy  . Portacath placement  07/20/11    Right side placed  . Portacath placement      left side & right side  . Port-a-cath removal  05/06/2012    Procedure: MINOR REMOVAL PORT-A-CATH;  Surgeon: Gwenyth Ober,  MD;  Location: [facility];  Service: General;  Laterality: Right;  . Ventral hernia repair  09/06/2012    Procedure: LAPAROSCOPIC VENTRAL HERNIA;  Surgeon: Gwenyth Ober, MD;  Location: [facility];  Service: General;  Laterality: N/A;  . Insertion of mesh  09/06/2012    Procedure: INSERTION OF MESH;  Surgeon: Gwenyth Ober, MD;  Location: [facility];  Service: General;  Laterality: N/A;  . Hernia repair  09/06/12    lap incisional hernia    No Known Allergies  History   Social History  . Marital Status: Married    Spouse Name: N/A    Number of Children: 3  . Years of Education: N/A   Occupational History  . 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 Smoking status: Never Smoker   . Smokeless tobacco: Never Used  . Alcohol Use: No  . Drug Use: No  . Sexual Activity: Yes   Other Topics Concern  . Not on file   Social History Narrative    Family History  Problem Relation Age of Onset  . Cancer Maternal Uncle     lung  . Cancer Maternal Uncle     colon  . Cancer Maternal Uncle     prostate  . Diabetes Mother   . Hypertension Mother   . Cancer Father     leukemia  Review of Systems  Constitutional: Positive for unexpected weight change. Negative for fever.  HENT: Positive for congestion. Negative for dental problem, ear pain, nosebleeds, postnasal drip, rhinorrhea, sinus pressure, sneezing, sore throat and trouble swallowing.   Eyes: Negative for redness and itching.  Respiratory: Negative for cough, chest tightness, shortness of breath and wheezing.   Cardiovascular: Negative for palpitations and leg swelling.  Gastrointestinal: Negative for nausea and vomiting.  Genitourinary: Negative for dysuria.  Musculoskeletal: Negative for joint swelling.  Skin: Negative for rash.  Neurological: Positive for headaches.  Hematological: Does not bruise/bleed easily.  Psychiatric/Behavioral: Negative for dysphoric mood. The patient is not nervous/anxious.         Objective:   Physical Exam  Gen. Pleasant, well-nourished, in no distress, normal affect ENT - no lesions, no post nasal drip Neck: No JVD, no thyromegaly, no carotid bruits Lungs: no use of accessory muscles, no dullness to percussion, clear without rales or rhonchi  Cardiovascular: Rhythm regular, heart sounds  normal, no murmurs or gallops, no peripheral edema Abdomen: soft and non-tender, no hepatosplenomegaly, BS normal. Musculoskeletal: No deformities, no cyanosis or clubbing Neuro:  alert, non focal       Assessment & Plan:

## 2014-09-27 MED ORDER — GABAPENTIN 300 MG PO CAPS: 300.0000 mg | ORAL_CAPSULE | Freq: Two times a day (BID) | ORAL | Status: DC

## 2014-09-27 NOTE — Telephone Encounter (Signed)
Left message informing patient she can go pick up a prescription for pain at her pharmacy.

## 2014-09-27 NOTE — Telephone Encounter (Signed)
We did not discuss giving her Percocet. We discussed trying other things that the lidocaine patch did not work. Can send in gabapentin for her neurological pain in her feet. She can take 1 pill twice a day for her foot pain.

## 2014-09-28 ENCOUNTER — Telehealth: Payer: Self-pay | Admitting: Internal Medicine

## 2014-09-28 NOTE — Telephone Encounter (Signed)
We would like her to try it as Percocet is not a medication that is safe to be on long-term.

## 2014-09-28 NOTE — Telephone Encounter (Signed)
Please advise, thanks.

## 2014-09-28 NOTE — Telephone Encounter (Signed)
Pt called request to be on percocet instead of gabapentin for her right foot pain. Pt stated that gabapentin is not working for her because she was on it before. Please advise.

## 2014-09-28 NOTE — Telephone Encounter (Signed)
Left message for patient to call me back. 

## 2014-10-03 NOTE — Telephone Encounter (Signed)
Patient called me back. I informed her that Percocet is not a good medication for long term pain management and you would like her to try gabapentin. She said she has taken that in the past and it did not work. She said she has taken Percocet and it has been the only medication that has helped. She is not happy with your decision and is requesting to talk to you. I told her that she may need to schedule an office visit for that and that I would send you a message informing you of our conversation. Are there any other pain management options for this patient? Please advise, thanks.

## 2014-10-13 MED ORDER — MELOXICAM 7.5 MG PO TABS: 7.5000 mg | ORAL_TABLET | Freq: Every day | ORAL | Status: DC

## 2014-10-13 NOTE — Addendum Note (Signed)
Addended by: Vertell Novak A on: 10/13/2014 04:06 PM   Modules accepted: Orders

## 2014-10-13 NOTE — Telephone Encounter (Signed)
We definitely have other options for pain management. Have sent in a prescription for Mobic which she can try. She should take 1 pill daily and give it at least 3-4 days to work. Please also find out if she ever filled the foot brace that she was supposed to get after our last visit.

## 2014-10-13 NOTE — Telephone Encounter (Signed)
Left message for patient to call me back. 

## 2014-10-26 ENCOUNTER — Telehealth: Payer: Self-pay | Admitting: Pulmonary Disease

## 2014-10-26 NOTE — Telephone Encounter (Signed)
lmtcb X1 for pt  

## 2014-10-27 NOTE — Telephone Encounter (Signed)
LMTCB for the pt and in the meantime will forward to [facility] to check status of [place] study

## 2014-10-27 NOTE — Telephone Encounter (Signed)
lmtcb Mary [name] ° °

## 2014-10-30 NOTE — Telephone Encounter (Signed)
Pt returned call and LMOAM for me to return call. I returned her call and went straight to voice mail. LMOAM for pt to return my call. Rhonda J Cobb

## 2014-10-30 NOTE — Telephone Encounter (Signed)
HST scheduled for 11/06/14 at 9:00 am at [facility]. Mary [name]

## 2014-10-30 NOTE — Telephone Encounter (Signed)
Explained to patient that our equipment was not working properly and Dr. Elsworth Soho stated to refer her to the sleep lab to have them set her up for the HST.  Pt returned my call and I explained the above and gave her the phone number to the sleep lab [phone] to contact [facility] and have this study scheduled. Pt stated that she thought that she had received a call from them. Advised patient to contact [facility] and schedule this HST before the end of the year. Pt voiced understanding and stated that she would call now. Nothing else needed at this time. Rhonda J Cobb

## 2014-11-06 ENCOUNTER — Other Ambulatory Visit (HOSPITAL_BASED_OUTPATIENT_CLINIC_OR_DEPARTMENT_OTHER): Payer: BC Managed Care – PPO

## 2014-11-06 ENCOUNTER — Ambulatory Visit (HOSPITAL_BASED_OUTPATIENT_CLINIC_OR_DEPARTMENT_OTHER): Payer: BC Managed Care – PPO | Attending: Pulmonary Disease | Admitting: *Deleted

## 2014-11-06 DIAGNOSIS — Z85038 Personal history of other malignant neoplasm of large intestine: Secondary | ICD-10-CM

## 2014-11-06 DIAGNOSIS — G4733 Obstructive sleep apnea (adult) (pediatric): Secondary | ICD-10-CM | POA: Diagnosis not present

## 2014-11-06 DIAGNOSIS — C189 Malignant neoplasm of colon, unspecified: Secondary | ICD-10-CM

## 2014-11-07 ENCOUNTER — Telehealth: Payer: Self-pay

## 2014-11-07 LAB — CEA: CEA: 1 ng/mL (ref 0.0–5.0)

## 2014-11-07 NOTE — Telephone Encounter (Signed)
Left Message for pt to call back regarding lab results 

## 2014-11-07 NOTE — Telephone Encounter (Signed)
-----   Message from Ladell Pier, MD sent at 11/07/2014  9:18 AM EST ----- Please call patient, Mary [name] is normal

## 2014-11-08 ENCOUNTER — Telehealth: Payer: Self-pay | Admitting: Pulmonary Disease

## 2014-11-08 DIAGNOSIS — G4710 Hypersomnia, unspecified: Secondary | ICD-10-CM

## 2014-11-08 NOTE — Telephone Encounter (Signed)
Home sleep study showed moderate OSA-stop breathing 14 times an hour. Suggest C Pap titration study. Okay to order if she is willing

## 2014-11-08 NOTE — Telephone Encounter (Signed)
lmtcb 12/30

## 2014-11-08 NOTE — Sleep Study (Signed)
    NAME: Mary [name] DATE OF BIRTH:  [birth year] MEDICAL RECORD NUMBER [id number]  LOCATION: [facility]  PHYSICIAN: [place] OF STUDY: 11/06/2014  SLEEP STUDY TYPE: Out of Center Sleep Test                REFERRING PHYSICIAN: Rigoberto Noel, MD  INDICATION FOR STUDY: Loud snoring and witnessed apneas  EPWORTH SLEEPINESS SCORE:   14 HEIGHT:   71 WEIGHT:   254   BMI 35.4   The study was performed using the [place] system. The channels recorded were airflow with a nasal pressure cannula, oxygen saturation using a pulse oximeter, thoracic and abdominal respiratory movements were recorded with RIP belts. Body position was also monitored   Total recording time was 419 minutes. There were a total of 12 obstructive apneas, 3 central apneas, 1 mixed apneas and 80 hypopneas for an apnea-hypopnea index of 14 per hour, The lowest desaturation was 83 %. She spent 5 minutes with a saturation less than 90%. The average heart rate was 66 bpm, the highest heart rate was 89 bpm  IMPRESSION:  Mild-to-moderate obstructive sleep apnea with predominant hypopneas causing moderate oxygen desaturation    RECOMMENDATION:  C Pap titration study Alternatively an auto C Pap can be used if she does not have cardiopulmonary comorbidities   Rigoberto Noel MD Diplomate, American Board of Sleep Medicine  ELECTRONICALLY SIGNED ON:  11/08/2014, 3:04 PM [place] PH: [phone]   FX: [phone] [place]

## 2014-11-09 ENCOUNTER — Other Ambulatory Visit: Payer: BC Managed Care – PPO

## 2014-11-13 ENCOUNTER — Telehealth: Payer: Self-pay | Admitting: *Deleted

## 2014-11-13 NOTE — Telephone Encounter (Signed)
lmtcb 11/13/14

## 2014-11-13 NOTE — Telephone Encounter (Signed)
Left message for patient to call back regarding lab results.

## 2014-11-14 ENCOUNTER — Encounter: Payer: Self-pay | Admitting: *Deleted

## 2014-11-14 ENCOUNTER — Telehealth: Payer: Self-pay | Admitting: *Deleted

## 2014-11-14 NOTE — Telephone Encounter (Signed)
Unable to reach letter mailed to patient. 11/14/2014

## 2014-11-14 NOTE — Telephone Encounter (Signed)
-----   Message from Ladell Pier, MD sent at 11/07/2014  9:18 AM EST ----- Please call patient, cea is normal

## 2014-11-14 NOTE — Telephone Encounter (Signed)
Left message for patient to call for labs on 11/13/14 and attempted to reach her again today, but her mailbox was full.

## 2014-11-15 ENCOUNTER — Encounter (HOSPITAL_COMMUNITY): Payer: Self-pay | Admitting: Emergency Medicine

## 2014-11-15 ENCOUNTER — Emergency Department (INDEPENDENT_AMBULATORY_CARE_PROVIDER_SITE_OTHER)
Admission: EM | Admit: 2014-11-15 | Discharge: 2014-11-15 | Disposition: A | Discharge status: Home / Self Care | Payer: BLUE CROSS/BLUE SHIELD | Source: Home / Self Care | Attending: Family Medicine | Admitting: Family Medicine

## 2014-11-15 DIAGNOSIS — J358 Other chronic diseases of tonsils and adenoids: Secondary | ICD-10-CM

## 2014-11-15 DIAGNOSIS — J029 Acute pharyngitis, unspecified: Secondary | ICD-10-CM

## 2014-11-15 LAB — POCT RAPID STREP A: Streptococcus, Group A Screen (Direct): NEGATIVE

## 2014-11-15 MED ORDER — PREDNISONE 10 MG PO TABS: 30.0000 mg | ORAL_TABLET | Freq: Every day | ORAL | Status: DC

## 2014-11-15 MED ORDER — IPRATROPIUM BROMIDE 0.06 % NA SOLN
2.0000 | Freq: Four times a day (QID) | NASAL | Status: DC
Start: 2014-11-15 — End: 2017-01-08

## 2014-11-15 NOTE — ED Notes (Signed)
C/o ST onset 2 weeks w/mild pain when swallowing Denies fevers, chills Alert, no signs of acute distress.

## 2014-11-15 NOTE — Discharge Instructions (Signed)
Thank you for coming in today. Take prednisone daily for 5 days Use Atrovent nasal spray as needed We will call if culture comes back positive Follow-up with [facility] ear nose and throat as needed   Call or go to the emergency room if you get worse, have trouble breathing, have chest pains, or palpitations.   Pharyngitis Pharyngitis is redness, pain, and swelling (inflammation) of your pharynx.  CAUSES  Pharyngitis is usually caused by infection. Most of the time, these infections are from viruses (viral) and are part of a cold. However, sometimes pharyngitis is caused by bacteria (bacterial). Pharyngitis can also be caused by allergies. Viral pharyngitis may be spread from person to person by coughing, sneezing, and personal items or utensils (cups, forks, spoons, toothbrushes). Bacterial pharyngitis may be spread from person to person by more intimate contact, such as kissing.  SIGNS AND SYMPTOMS  Symptoms of pharyngitis include:   Sore throat.   Tiredness (fatigue).   Low-grade fever.   Headache.  Joint pain and muscle aches.  Skin rashes.  Swollen lymph nodes.  Plaque-like film on throat or tonsils (often seen with bacterial pharyngitis). DIAGNOSIS  Your health care provider will ask you questions about your illness and your symptoms. Your medical history, along with a physical exam, is often all that is needed to diagnose pharyngitis. Sometimes, a rapid strep test is done. Other lab tests may also be done, depending on the suspected cause.  TREATMENT  Viral pharyngitis will usually get better in 3-4 days without the use of medicine. Bacterial pharyngitis is treated with medicines that kill germs (antibiotics).  HOME CARE INSTRUCTIONS   Drink enough water and fluids to keep your urine clear or pale yellow.   Only take over-the-counter or prescription medicines as directed by your health care provider:   If you are prescribed antibiotics, make sure you finish them  even if you start to feel better.   Do not take aspirin.   Get lots of rest.   Gargle with 8 oz of salt water ( tsp of salt per 1 qt of water) as often as every 1-2 hours to soothe your throat.   Throat lozenges (if you are not at risk for choking) or sprays may be used to soothe your throat. SEEK MEDICAL CARE IF:   You have large, tender lumps in your neck.  You have a rash.  You cough up green, yellow-brown, or bloody spit. SEEK IMMEDIATE MEDICAL CARE IF:   Your neck becomes stiff.  You drool or are unable to swallow liquids.  You vomit or are unable to keep medicines or liquids down.  You have severe pain that does not go away with the use of recommended medicines.  You have trouble breathing (not caused by a stuffy nose). MAKE SURE YOU:   Understand these instructions.  Will watch your condition.  Will get help right away if you are not doing well or get worse. Document Released: 10/27/2005 Document Revised: 08/17/2013 Document Reviewed: 07/04/2013 [facility] Patient Information 2015 [place], [place]. This information is not intended to replace advice given to you by your health care provider. Make sure you discuss any questions you have with your health care provider.

## 2014-11-15 NOTE — ED Provider Notes (Signed)
Mary [name] is a 54 y.o. female who presents to Urgent Care today for sore throat present for 2 days. Patient notes mild pain with swallowing no fevers chills vomiting or diarrhea. Patient has mild cough. No chest pain palpitations or shortness of breath. No treatment used yet.   Past Medical History  Diagnosis Date  . Blood transfusion   . History of migraine headaches   . iron deficiency anemia   . Multiple hemangiomas     Liver  . Renal cysts, acquired, bilateral   . Pneumothorax 07/19/11    Bilateral  . Arm DVT (deep venous thromboembolism), acute 07/04/11    r/t PAC  . 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 GERD (gastroesophageal reflux disease)   . Incisional hernia   . Adenocarcinoma sigmoid colon 05/12/11    Stage III(pT2pN2b) descending sigmoid   Past Surgical History  Procedure Laterality Date  . Abdominal hysterectomy    . Colon surgery  05/17/11    Partial colectomy  . Portacath placement  07/20/11    Right side placed  . Portacath placement      left side & right side  . Port-a-cath removal  05/06/2012    Procedure: MINOR REMOVAL PORT-A-CATH;  Surgeon: Gwenyth Ober, MD;  Location: [facility];  Service: General;  Laterality: Right;  . Ventral hernia repair  09/06/2012    Procedure: LAPAROSCOPIC VENTRAL HERNIA;  Surgeon: Gwenyth Ober, MD;  Location: [facility];  Service: General;  Laterality: N/A;  . Insertion of mesh  09/06/2012    Procedure: INSERTION OF MESH;  Surgeon: Gwenyth Ober, MD;  Location: [facility];  Service: General;  Laterality: N/A;  . Hernia repair  09/06/12    lap incisional hernia   History  Substance Use Topics  . Smoking status: Never Smoker   . Smokeless tobacco: Never Used  . Alcohol Use: No   ROS as above Medications: No current facility-administered medications for this encounter.   Current Outpatient Prescriptions  Medication Sig Dispense Refill  . Biotin 5000 MCG  CAPS Take 5,000 mcg by mouth every morning.    . diclofenac sodium (VOLTAREN) 1 % GEL Apply 2 g topically 4 (four) times daily. 100 g 3  . docusate sodium (COLACE) 100 MG capsule Take 100 mg by mouth 2 (two) times daily as needed for mild constipation.    . gabapentin (NEURONTIN) 300 MG capsule Take 1 capsule (300 mg total) by mouth 2 (two) times daily. 60 capsule 0  . hydroxypropyl methylcellulose (ISOPTO TEARS) 2.5 % ophthalmic solution Place 1 drop into both eyes daily as needed for dry eyes.    Marland Kitchen ipratropium (ATROVENT) 0.06 % nasal spray Place 2 sprays into both nostrils 4 (four) times daily. 15 mL 1  . meloxicam (MOBIC) 7.5 MG tablet Take 1 tablet (7.5 mg total) by mouth daily. 30 tablet 0  . polyethylene glycol (MIRALAX / GLYCOLAX) packet Take 17 g by mouth daily. 14 each 0  . predniSONE (DELTASONE) 10 MG tablet Take 3 tablets (30 mg total) by mouth daily. 15 tablet 0  . [DISCONTINUED] potassium chloride SA (K-DUR,KLOR-CON) 20 MEQ tablet Take 20 mEq by mouth daily.    . [DISCONTINUED] prochlorperazine (COMPAZINE) 10 MG tablet Take 10 mg by mouth every 6 (six) hours as needed. For nausea/vomiting.     No Known Allergies   Exam:  BP 121/73 mmHg  Pulse 68  Temp(Src) 98.2 F (36.8 C) (Oral)  Resp 16  SpO2 99% Gen: Well NAD HEENT: EOMI,  MMM tonsil stones bilaterally with cobblestoning present. Normal tympanic membranes bilaterally Lungs: Normal work of breathing. CTABL Heart: RRR no MRG Abd: NABS, Soft. Nondistended, Nontender Exts: Brisk capillary refill, warm and well perfused.   Results for orders placed or performed during the hospital encounter of 11/15/14 (from the past 24 hour(s))  POCT rapid strep A [facility])     Status: None   Collection Time: 11/15/14 10:14 AM  Result Value Ref Range   Streptococcus, Group A Screen (Direct) NEGATIVE NEGATIVE   No results found.  Assessment and Plan: 54 y.o. female with pharyngitis likely viral. Complicated by tonsil stones.  Treatment with prednisone and Atrovent nasal spray. Culture pending. Follow-up with ENT as needed.  Discussed warning signs or symptoms. Please see discharge instructions. Patient expresses understanding.     Gregor Hams, MD 11/15/14 1025

## 2014-11-16 ENCOUNTER — Telehealth: Payer: Self-pay | Admitting: Nurse Practitioner

## 2014-11-16 ENCOUNTER — Ambulatory Visit (HOSPITAL_BASED_OUTPATIENT_CLINIC_OR_DEPARTMENT_OTHER): Payer: BLUE CROSS/BLUE SHIELD | Admitting: Nurse Practitioner

## 2014-11-16 ENCOUNTER — Telehealth: Payer: Self-pay | Admitting: Pulmonary Disease

## 2014-11-16 ENCOUNTER — Other Ambulatory Visit: Payer: Self-pay | Admitting: Nurse Practitioner

## 2014-11-16 ENCOUNTER — Telehealth: Payer: Self-pay

## 2014-11-16 VITALS — BP 154/76 | HR 69 | Temp 98.6°F | Resp 18 | Ht 71.0 in | Wt 249.9 lb

## 2014-11-16 DIAGNOSIS — C189 Malignant neoplasm of colon, unspecified: Secondary | ICD-10-CM

## 2014-11-16 DIAGNOSIS — Z85038 Personal history of other malignant neoplasm of large intestine: Secondary | ICD-10-CM

## 2014-11-16 DIAGNOSIS — Z1231 Encounter for screening mammogram for malignant neoplasm of breast: Secondary | ICD-10-CM

## 2014-11-16 NOTE — Telephone Encounter (Signed)
Called Dr. Mar Daring Mann's office for a recent colonoscopy report that was done. Spoke with Lattie Haw at [facility] who is faxing it now. Fax received, copy to Ned Card, NP and copy to medical records to scan.

## 2014-11-16 NOTE — Telephone Encounter (Signed)
Patient returning call.

## 2014-11-16 NOTE — Telephone Encounter (Signed)
Pt confirmed labs/ov per 01/07 POF, gave pt AVS..... KJ, called [facility] and sch pt with MM...Marland KitchenMarland Kitchen
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
## 2014-11-16 NOTE — Telephone Encounter (Signed)
LMTCB

## 2014-11-16 NOTE — Telephone Encounter (Signed)
Per 11/08/14 phone note: Rigoberto Noel, MD at 11/08/2014 3:11 PM     Status: Signed       Expand All Collapse All   Home sleep study showed moderate OSA-stop breathing 14 times an hour. Suggest C Pap titration study. Okay to order if she is willing    ---  lmomtcb x1 for pt

## 2014-11-17 LAB — CULTURE, GROUP A STREP

## 2014-11-17 NOTE — Telephone Encounter (Signed)
lmtcb x2 

## 2014-11-20 NOTE — Telephone Encounter (Signed)
LMTCBx3. Shani Fitch, CMA  

## 2014-11-21 ENCOUNTER — Ambulatory Visit
Admission: RE | Admit: 2014-11-21 | Discharge: 2014-11-21 | Disposition: A | Discharge status: Home / Self Care | Payer: BLUE CROSS/BLUE SHIELD | Source: Ambulatory Visit | Attending: Nurse Practitioner | Admitting: Nurse Practitioner

## 2014-11-21 DIAGNOSIS — Z1231 Encounter for screening mammogram for malignant neoplasm of breast: Secondary | ICD-10-CM

## 2014-11-21 NOTE — Telephone Encounter (Signed)
LM x 4 Letter mailed to patient to contact our office for results Nothing further needed.

## 2014-11-22 ENCOUNTER — Other Ambulatory Visit: Payer: Self-pay | Admitting: Nurse Practitioner

## 2014-11-22 ENCOUNTER — Telehealth: Payer: Self-pay | Admitting: Pulmonary Disease

## 2014-11-22 DIAGNOSIS — R928 Other abnormal and inconclusive findings on diagnostic imaging of breast: Secondary | ICD-10-CM

## 2014-11-22 DIAGNOSIS — G4733 Obstructive sleep apnea (adult) (pediatric): Secondary | ICD-10-CM

## 2014-11-22 NOTE — Telephone Encounter (Signed)
Pt is aware of her home sleep study results per telephone note from 11/16/14. She is willing to do CPAP titration study. Order will be placed for this.

## 2014-11-22 NOTE — Telephone Encounter (Signed)
Pt is aware of results. Order has been placed for CPAP titration.

## 2014-12-07 ENCOUNTER — Other Ambulatory Visit: Payer: Self-pay | Admitting: Nurse Practitioner

## 2014-12-07 ENCOUNTER — Ambulatory Visit
Admission: RE | Admit: 2014-12-07 | Discharge: 2014-12-07 | Disposition: A | Discharge status: Home / Self Care | Payer: BLUE CROSS/BLUE SHIELD | Source: Ambulatory Visit | Attending: Nurse Practitioner | Admitting: Nurse Practitioner

## 2014-12-07 ENCOUNTER — Telehealth: Payer: Self-pay | Admitting: Pulmonary Disease

## 2014-12-07 DIAGNOSIS — R928 Other abnormal and inconclusive findings on diagnostic imaging of breast: Secondary | ICD-10-CM

## 2014-12-07 DIAGNOSIS — N6310 Unspecified lump in the right breast, unspecified quadrant: Secondary | ICD-10-CM

## 2014-12-07 NOTE — Telephone Encounter (Signed)
This is [facility] pt not [facility]. Called pt and LMTCB x1

## 2014-12-11 NOTE — Telephone Encounter (Signed)
lmtcb X1 for pt.  Mary [name] please advise if you have the cpap titration results yet.

## 2014-12-12 NOTE — Telephone Encounter (Signed)
Second time calling about status of cpap [phone]

## 2014-12-12 NOTE — Telephone Encounter (Signed)
I do not see in the chart where this test was scheduled.  I sent message to [facility] to investigate.

## 2014-12-12 NOTE — Telephone Encounter (Signed)
According to recent appts/orders, order was placed for CPAP Titration. appt was never scheduled because they were unable to get in touch with patient to schedule this.   11/22/14 LMOAM for pt to return my call to schedule/rjc    11/27/14 at 11:20 am LMOAM (mobile) to return my call to schedule/rjc 3rd and final time on 12/06/14 at 5:08 pm LMOAM (mobile) that I had attempted to contact her to arrange.  That if she wanted this study scheduled, to please call me at [phone]/rjc  Pt needs to speak with [facility] to schedule appt.   Left detailed message to advise the need to schedule appt.

## 2014-12-13 NOTE — Telephone Encounter (Signed)
ATC pt - went directly to VM. lmomtcb for pt

## 2014-12-13 NOTE — Telephone Encounter (Signed)
Called patient, LM to return our call.  Pt needs appt scheduled for sleep study per [facility] notes

## 2014-12-13 NOTE — Telephone Encounter (Signed)
Pt returned call- [phone]

## 2014-12-14 NOTE — Telephone Encounter (Signed)
lmtcb for pt.  

## 2014-12-15 NOTE — Telephone Encounter (Signed)
Pt returned call back. Informed pt she needs the CPAP titration. Pt stated if PCC's call today call her work number and if they call on Monday or Tuesday then to call her home phone.   PCC's please advise.

## 2014-12-15 NOTE — Telephone Encounter (Signed)
Attempted to return pt's call, no answer. LMOAM on phone for pt to contact me on Monday at [phone] to arrange cpap titration study. Rhonda J Cobb

## 2014-12-18 ENCOUNTER — Other Ambulatory Visit: Payer: Self-pay | Admitting: Nurse Practitioner

## 2014-12-18 DIAGNOSIS — N6310 Unspecified lump in the right breast, unspecified quadrant: Secondary | ICD-10-CM

## 2014-12-19 NOTE — Telephone Encounter (Signed)
Called pt's work number and did not have patient's ext number. Left another message on pt's cell phone number [phone] again at 10:25 am. Advised patient on this message to call my number instead of [phone]. Advised patient that if she would contact [phone] she will reach me and I will be glad to assist her in scheduling this test.   If patient returns call, please transfer her to myself or [place] to schedule CPAP Titration Study. Rhonda J Cobb

## 2014-12-21 ENCOUNTER — Ambulatory Visit
Admission: RE | Admit: 2014-12-21 | Discharge: 2014-12-21 | Disposition: A | Discharge status: Home / Self Care | Payer: BLUE CROSS/BLUE SHIELD | Source: Ambulatory Visit | Attending: Nurse Practitioner | Admitting: Nurse Practitioner

## 2014-12-21 DIAGNOSIS — N6310 Unspecified lump in the right breast, unspecified quadrant: Secondary | ICD-10-CM

## 2014-12-21 NOTE — Telephone Encounter (Signed)
Attempted to contact the patient again to schedule CPAP Titration Study. Went straight to voice mail stating that the mailbox is full. Have left multiple messages and patient has not returned any of my calls. Rhonda J Cobb

## 2014-12-21 NOTE — Telephone Encounter (Signed)
Do not see where study has been scheduled. ATC pt to see if she would like to proceed with scheduling - received msg "VM box is full and cannot accept new msgs at this time.  Please try your call again later."

## 2014-12-25 NOTE — Telephone Encounter (Signed)
Pt returned call and has been scheduled for Tues 02/27/15. Sleep packet mailed to patient. Pt advised if she is interested in an earlier appointment, once we determine if pre cert is needed, she can call the sleep center for a cancellation. Pt stated that her schedule plays a big part in her availability b/c her schedule changes from week to week. Pt advised that the sleep lab is booking into April at the present time. Sleep packet mailed to patient and she is aware of appointment date, time and location. Nothing else needed at this time. Rhonda J Cobb

## 2014-12-25 NOTE — Telephone Encounter (Signed)
Pt returned call. [phone]

## 2014-12-25 NOTE — Telephone Encounter (Signed)
LMTCB-pt to speak with PCC's.

## 2015-02-27 ENCOUNTER — Ambulatory Visit (HOSPITAL_BASED_OUTPATIENT_CLINIC_OR_DEPARTMENT_OTHER): Payer: BLUE CROSS/BLUE SHIELD | Attending: Pulmonary Disease | Admitting: Radiology

## 2015-02-27 VITALS — Ht 71.0 in | Wt 228.0 lb

## 2015-02-27 DIAGNOSIS — G4733 Obstructive sleep apnea (adult) (pediatric): Secondary | ICD-10-CM | POA: Insufficient documentation

## 2015-03-07 ENCOUNTER — Telehealth: Payer: Self-pay | Admitting: Pulmonary Disease

## 2015-03-07 DIAGNOSIS — G4730 Sleep apnea, unspecified: Secondary | ICD-10-CM | POA: Diagnosis not present

## 2015-03-07 DIAGNOSIS — G4733 Obstructive sleep apnea (adult) (pediatric): Secondary | ICD-10-CM

## 2015-03-07 NOTE — Sleep Study (Signed)
[facility]  NAME: Mary [name]  DATE OF BIRTH: 1961-02-26  MEDICAL RECORD NUMBER [id number]  LOCATION: [facility]  PHYSICIAN: Tung Pustejovsky V.  DATE OF STUDY:   SLEEP STUDY TYPE: CPAP titration study               REFERRING PHYSICIAN: Rigoberto Noel, MD  INDICATION FOR STUDY: 54 year old woman with moderate OSA. Home sleep study showed AHI of 14 per hour. At the time of this study ,they weighed 228 pounds with a height of  5 ft 11 inches and the BMI of 32, neck size of 15.5 inches. Epworth sleepiness score was 22   This CPAP titration polysomnogram was performed with a sleep technologist in attendance. EEG, EOG,EMG and respiratory parameters recorded. Sleep stages, arousals, limb movements and respiratory data was scored according to criteria laid out by the American Academy of sleep medicine.  SLEEP ARCHITECTURE: Lights out was at 2232 PM and lights on was at 513 AM. Total sleep time was 368 minutes with sleep period time of 398 minutes and sleep efficiency of 92% .Sleep latency was 3 minutes with latency to REM sleep of 87 minutes and wake after sleep onset of 30 minutes.  Sleep stages as a percentage of total sleep time was N1 8% N2- 81 % and REM sleep 11 % ( 41 minutes) . The longest period of REM sleep was around 3 AM.   AROUSAL DATA : There were 76 arousals with an arousal index of 12 events per hour. Of these 73 were spontaneous, and 3 were associated with respiratory events and 0 were associated periodic limb movements  RESPIRATORY DATA: CPAP was initiated at 5 centimeters and titrated to a final level of 7 centimeters due to respiratory events and snoring. At the final level of 7 centimeters, there were 1 obstructive apneas, 3 central apneas, 0 mixed apneas and 2 hypopneas with apnea -hypopnea index of 1.2 events per hour.  There was no relation to sleep stage or body position. Titration was optimal.  MOVEMENT/PARASOMNIA: There were 4  PLMS with a PLM index of 0.7 events per hour. The PLM arousal index was 0 events per hour.  OXYGEN DATA: The lowest desaturation was 88 % during non-REM sleep and the desaturation index was 1 per hour. The saturations stayed below 88% for 0 minutes.  CARDIAC DATA: The low heart rate was 47 beats per minute. The high heart rate recorded was an artifact. No arrhythmias were noted  Discussion-a small fullface mask was used. She tolerated CPAP very well  IMPRESSION :  1. Mild-to-moderate obstructive sleep apnea with hypopneas causing sleep fragmentation and mild oxygen desaturation. 2. This was corrected by CPAP of 7 centimeters with a small fullface mask. Titration was optimal. 3. No evidence of cardiac arrhythmias or behavioral disturbance during sleep. 4. Periodic limb movements were not noted.  RECOMMENDATION:    1. The treatment options for this degree of sleep disordered breathing includes weight loss and/or CPAP therapy. CPAP can be initiated at 7 centimeters with a small fullface mask and compliance monitored at this level. 2. Patient should be cautioned against driving when sleepy 3. They should be asked to avoid medications with sedative side effects  Rigoberto Noel  MD Diplomate, American Board of Sleep Medicine  ELECTRONICALLY SIGNED ON: 03/07/2015  [facility] PH: [phone]   FX: [phone] [place]

## 2015-03-07 NOTE — Telephone Encounter (Signed)
Prescription sent to DME for CPAP 7 cm, small fullface mask, Please arrange follow-up office visit with me in 6 weeks

## 2015-03-07 NOTE — Assessment & Plan Note (Signed)
HST - mild AHI 14/h 03/2015 CPAP 7

## 2015-03-08 NOTE — Telephone Encounter (Signed)
lmtcb

## 2015-03-12 NOTE — Telephone Encounter (Signed)
lmtcb

## 2015-03-13 NOTE — Telephone Encounter (Signed)
Patient notified.  Nothing further needed. 

## 2015-03-14 ENCOUNTER — Emergency Department (INDEPENDENT_AMBULATORY_CARE_PROVIDER_SITE_OTHER)
Admission: EM | Admit: 2015-03-14 | Discharge: 2015-03-14 | Disposition: A | Discharge status: Home / Self Care | Payer: BLUE CROSS/BLUE SHIELD | Source: Home / Self Care | Attending: Family Medicine | Admitting: Family Medicine

## 2015-03-14 ENCOUNTER — Encounter (HOSPITAL_COMMUNITY): Payer: Self-pay | Admitting: Emergency Medicine

## 2015-03-14 DIAGNOSIS — L03119 Cellulitis of unspecified part of limb: Secondary | ICD-10-CM

## 2015-03-14 MED ORDER — MINOCYCLINE HCL 100 MG PO CAPS: 100.0000 mg | ORAL_CAPSULE | Freq: Two times a day (BID) | ORAL | Status: DC

## 2015-03-14 MED ORDER — HYDROXYZINE HCL 25 MG PO TABS: 25.0000 mg | ORAL_TABLET | Freq: Three times a day (TID) | ORAL | Status: DC | PRN

## 2015-03-14 NOTE — ED Notes (Signed)
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

## 2015-03-14 NOTE — Discharge Instructions (Signed)
Wound check in two days Warm compress 4 x day Cellulitis Cellulitis is an infection of the skin and the tissue beneath it. The infected area is usually red and tender. Cellulitis occurs most often in the arms and lower legs.  CAUSES  Cellulitis is caused by bacteria that enter the skin through cracks or cuts in the skin. The most common types of bacteria that cause cellulitis are staphylococci and streptococci. SIGNS AND SYMPTOMS   Redness and warmth.  Swelling.  Tenderness or pain.  Fever. DIAGNOSIS  Your health care provider can usually determine what is wrong based on a physical exam. Blood tests may also be done. TREATMENT  Treatment usually involves taking an antibiotic medicine. HOME CARE INSTRUCTIONS   Take your antibiotic medicine as directed by your health care provider. Finish the antibiotic even if you start to feel better.  Keep the infected arm or leg elevated to reduce swelling.  Apply a warm cloth to the affected area up to 4 times per day to relieve pain.  Take medicines only as directed by your health care provider.  Keep all follow-up visits as directed by your health care provider. SEEK MEDICAL CARE IF:   You notice red streaks coming from the infected area.  Your red area gets larger or turns dark in color.  Your bone or joint underneath the infected area becomes painful after the skin has healed.  Your infection returns in the same area or another area.  You notice a swollen bump in the infected area.  You develop new symptoms.  You have a fever. SEEK IMMEDIATE MEDICAL CARE IF:   You feel very sleepy.  You develop vomiting or diarrhea.  You have a general ill feeling (malaise) with muscle aches and pains. MAKE SURE YOU:   Understand these instructions.  Will watch your condition.  Will get help right away if you are not doing well or get worse. Document Released: 08/06/2005 Document Revised: 03/13/2014 Document Reviewed:  01/12/2012 [facility] Patient Information 2015 [place], [place]. This information is not intended to replace advice given to you by your health care provider. Make sure you discuss any questions you have with your health care provider.

## 2015-03-14 NOTE — ED Provider Notes (Signed)
CSN: [id number]     Arrival date & time 03/14/15  1852 History   First MD Initiated Contact with Patient 03/14/15 2023     Chief Complaint  Patient presents with  . Insect Bite   (Consider location/radiation/quality/duration/timing/severity/associated sxs/prior Treatment) HPI Comments: Patient noticed an area of redness at right posterior lower leg two days ago This area has been warm to the touch, itchy and sore. No fever or chills. No known injury or insect stings/bites. No Hx of DM. States she has had similar issues in the past that have required treatment with antibiotics.  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   The history is provided by the patient.    Past Medical History  Diagnosis Date  . Blood transfusion   . History of migraine headaches   . iron deficiency anemia   . Multiple hemangiomas     Liver  . Renal cysts, acquired, bilateral   . Pneumothorax 07/19/11    Bilateral  . Arm DVT (deep venous thromboembolism), acute 07/04/11    r/t PAC  . 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 GERD (gastroesophageal reflux disease)   . Incisional hernia   . Adenocarcinoma sigmoid colon 05/12/11    Stage III(pT2pN2b) descending sigmoid   Past Surgical History  Procedure Laterality Date  . Abdominal hysterectomy    . Colon surgery  05/17/11    Partial colectomy  . Portacath placement  07/20/11    Right side placed  . Portacath placement      left side & right side  . Port-a-cath removal  05/06/2012    Procedure: MINOR REMOVAL PORT-A-CATH;  Surgeon: Gwenyth Ober, MD;  Location: [place];  Service: General;  Laterality: Right;  . Ventral hernia repair  09/06/2012    Procedure: LAPAROSCOPIC VENTRAL HERNIA;  Surgeon: Gwenyth Ober, MD;  Location: [place];  Service: General;  Laterality: N/A;  . Insertion of mesh  09/06/2012    Procedure: INSERTION OF MESH;  Surgeon: Gwenyth Ober, MD;  Location: [place];  Service: General;  Laterality: N/A;  . Hernia repair  09/06/12    lap incisional hernia   Family History  Problem Relation Age of Onset  . Cancer Maternal Uncle     lung  . Cancer Maternal Uncle     colon  . Cancer Maternal Uncle     prostate  . Diabetes Mother   . Hypertension Mother   . Cancer Father     leukemia   History  Substance Use Topics  . Smoking status: Never Smoker   . Smokeless tobacco: Never Used  . Alcohol Use: No   OB History    No data available     Review of Systems  All other systems reviewed and are negative.   Allergies  Review of patient's allergies indicates no known allergies.  Home Medications   Prior to Admission medications   Medication Sig Start Date End Date Taking? Authorizing Provider  Biotin 5000 MCG CAPS Take 5,000 mcg by mouth every morning.    Historical Provider, MD  diclofenac sodium (VOLTAREN) 1 % GEL Apply 2 g topically 4 (four) times daily. Patient not taking: Reported on 11/16/2014 09/14/14   Olga Millers, MD  docusate sodium (COLACE) 100 MG capsule Take 100 mg by mouth 2 (two) times daily as needed for mild constipation.    Historical Provider, MD  gabapentin (NEURONTIN) 300 MG  capsule Take 1 capsule (300 mg total) by mouth 2 (two) times daily. Patient not taking: Reported on 11/16/2014 09/27/14   Olga Millers, MD  hydroxypropyl methylcellulose (ISOPTO TEARS) 2.5 % ophthalmic solution Place 1 drop into both eyes daily as needed for dry eyes.    Historical Provider, MD  hydrOXYzine (ATARAX/VISTARIL) 25 MG tablet Take 1 tablet (25 mg total) by mouth 3 (three) times daily as needed for itching. 03/14/15   Audelia Hives Antrell Tipler, PA  ipratropium (ATROVENT) 0.06 % nasal spray Place 2 sprays into both nostrils 4 (four) times daily. 11/15/14   Gregor Hams, MD  meloxicam (MOBIC) 7.5 MG tablet Take 1 tablet (7.5 mg total) by mouth daily. Patient not taking: Reported on 11/16/2014 10/13/14   Olga Millers, MD  minocycline  (MINOCIN) 100 MG capsule Take 1 capsule (100 mg total) by mouth 2 (two) times daily. 03/14/15   Audelia Hives Iyan Flett, PA  polyethylene glycol (MIRALAX / GLYCOLAX) packet Take 17 g by mouth daily. Patient not taking: Reported on 11/16/2014 02/27/14   Verlee Monte, MD  predniSONE (DELTASONE) 10 MG tablet Take 3 tablets (30 mg total) by mouth daily. 11/15/14   Gregor Hams, MD   BP 154/95 mmHg  Pulse 80  Temp(Src) 98.3 F (36.8 C) (Oral)  Resp 20  SpO2 99% Physical Exam  Constitutional: She is oriented to person, place, and time. She appears well-developed and well-nourished. No distress.  HENT:  Head: Normocephalic and atraumatic.  Eyes: Conjunctivae are normal.  Cardiovascular: Normal rate.   Pulmonary/Chest: Effort normal.  Musculoskeletal: Normal range of motion.  Neurological: She is alert and oriented to person, place, and time.  Skin: Skin is warm and dry.     Two small areas of redness, tenderness without induration or fluctuance at right lower posterior leg. No indication of abscess. Each area is roughly 3x3cm   Psychiatric: She has a normal mood and affect. Her behavior is normal.  Nursing note and vitals reviewed.   ED Course  Procedures (including critical care time) Labs Review Labs Reviewed - No data to display  Imaging Review No results found.   MDM   1. Cellulitis of lower leg   warm compresses Elevation Minocycline Wound recheck in 2 days     Lutricia Feil, [place] 03/14/15 [phone]

## 2015-03-20 ENCOUNTER — Telehealth: Payer: Self-pay | Admitting: Pulmonary Disease

## 2015-03-20 NOTE — Telephone Encounter (Signed)
Called and spoke to [facility] rep and was advised they have tried to contact pt but have been unsuccessful in reaching her and was advised to have pt call [facility] to get her CPAP set up. Called and spoke to pt and informed her to call [facility] at [phone] at [phone] or [phone]. Pt verbalized understanding and denied any further questions or concerns at this time.

## 2015-04-12 ENCOUNTER — Other Ambulatory Visit: Payer: Self-pay | Admitting: Pulmonary Disease

## 2015-04-12 DIAGNOSIS — G4733 Obstructive sleep apnea (adult) (pediatric): Secondary | ICD-10-CM

## 2015-05-09 ENCOUNTER — Ambulatory Visit: Payer: BLUE CROSS/BLUE SHIELD | Admitting: Pulmonary Disease

## 2015-05-11 ENCOUNTER — Emergency Department (INDEPENDENT_AMBULATORY_CARE_PROVIDER_SITE_OTHER)
Admission: EM | Admit: 2015-05-11 | Discharge: 2015-05-11 | Disposition: A | Discharge status: Home / Self Care | Payer: BLUE CROSS/BLUE SHIELD | Source: Home / Self Care

## 2015-05-11 ENCOUNTER — Other Ambulatory Visit (HOSPITAL_COMMUNITY)
Admission: RE | Admit: 2015-05-11 | Discharge: 2015-05-11 | Disposition: A | Discharge status: Home / Self Care | Payer: BLUE CROSS/BLUE SHIELD | Source: Ambulatory Visit | Attending: Family Medicine | Admitting: Family Medicine

## 2015-05-11 ENCOUNTER — Encounter (HOSPITAL_COMMUNITY): Payer: Self-pay | Admitting: Emergency Medicine

## 2015-05-11 ENCOUNTER — Telehealth: Payer: Self-pay | Admitting: Pulmonary Disease

## 2015-05-11 DIAGNOSIS — A499 Bacterial infection, unspecified: Secondary | ICD-10-CM

## 2015-05-11 DIAGNOSIS — B373 Candidiasis of vulva and vagina: Secondary | ICD-10-CM

## 2015-05-11 DIAGNOSIS — B3731 Acute candidiasis of vulva and vagina: Secondary | ICD-10-CM

## 2015-05-11 DIAGNOSIS — Z113 Encounter for screening for infections with a predominantly sexual mode of transmission: Secondary | ICD-10-CM | POA: Insufficient documentation

## 2015-05-11 DIAGNOSIS — N760 Acute vaginitis: Secondary | ICD-10-CM

## 2015-05-11 DIAGNOSIS — B9689 Other specified bacterial agents as the cause of diseases classified elsewhere: Secondary | ICD-10-CM

## 2015-05-11 LAB — POCT URINALYSIS DIP (DEVICE)
Bilirubin Urine: NEGATIVE
Glucose, UA: NEGATIVE mg/dL
Hgb urine dipstick: NEGATIVE
Ketones, ur: NEGATIVE mg/dL
Leukocytes, UA: NEGATIVE
Nitrite: NEGATIVE
Protein, ur: NEGATIVE mg/dL
Specific Gravity, Urine: 1.025 (ref 1.005–1.030)
Urobilinogen, UA: 0.2 mg/dL (ref 0.0–1.0)
pH: 6.5 (ref 5.0–8.0)

## 2015-05-11 MED ORDER — FLUCONAZOLE 200 MG PO TABS: 200.0000 mg | ORAL_TABLET | Freq: Once | ORAL | Status: DC

## 2015-05-11 MED ORDER — METRONIDAZOLE 500 MG PO TABS: 500.0000 mg | ORAL_TABLET | Freq: Two times a day (BID) | ORAL | Status: DC

## 2015-05-11 NOTE — ED Notes (Signed)
C/o vag d/c onset 3 weeks associated w/foul odor  Denies fevers, chills, abd pain, urinary sx  Also c/o right foot pain/swelling onset today... Hx of neuropathy  Denies inj/trauma... Pain increases after being on feet for a long time Ambulated well to exam room w/NAD No acute distress.

## 2015-05-11 NOTE — Telephone Encounter (Signed)
Spoke with pt. She had our office confused with another office for her medications. Nothing further needed.

## 2015-05-11 NOTE — ED Provider Notes (Signed)
CSN: [id number]     Arrival date & time 05/11/15  1335 History   None    Chief Complaint  Patient presents with  . Vaginal Discharge  . Foot Pain   (Consider location/radiation/quality/duration/timing/severity/associated sxs/prior Treatment)  HPI   Patient is a 54 year old female presenting tonight with complaints of a yellow vaginal discharge and odor that began approximately 3 weeks ago. Patient states she treated with over-the-counter AZO and Monistat cream with little to no relief. Patient states she had a partial hysterectomy several years ago secondary to cancer diagnosis and has resulting neuropathy and extremities intermittently and is complaining of intermittent swelling of right lower extremity that is currently "okay".  Past Medical History  Diagnosis Date  . Blood transfusion   . History of migraine headaches   . iron deficiency anemia   . Multiple hemangiomas     Liver  . Renal cysts, acquired, bilateral   . Pneumothorax 07/19/11    Bilateral  . Arm DVT (deep venous thromboembolism), acute 07/04/11    r/t PAC  . 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 GERD (gastroesophageal reflux disease)   . Incisional hernia   . Adenocarcinoma sigmoid colon 05/12/11    Stage III(pT2pN2b) descending sigmoid   Past Surgical History  Procedure Laterality Date  . Abdominal hysterectomy    . Colon surgery  05/17/11    Partial colectomy  . Portacath placement  07/20/11    Right side placed  . Portacath placement      left side & right side  . Port-a-cath removal  05/06/2012    Procedure: MINOR REMOVAL PORT-A-CATH;  Surgeon: Gwenyth Ober, MD;  Location: [place];  Service: General;  Laterality: Right;  . Ventral hernia repair  09/06/2012    Procedure: LAPAROSCOPIC VENTRAL HERNIA;  Surgeon: Gwenyth Ober, MD;  Location[place];  Service: General;  Laterality: N/A;  . Insertion of mesh  09/06/2012    Procedure:  INSERTION OF MESH;  Surgeon: Gwenyth Ober, MD;  Location: [place];  Service: General;  Laterality: N/A;  . Hernia repair  09/06/12    lap incisional hernia   Family History  Problem Relation Age of Onset  . Cancer Maternal Uncle     lung  . Cancer Maternal Uncle     colon  . Cancer Maternal Uncle     prostate  . Diabetes Mother   . Hypertension Mother   . Cancer Father     leukemia   History  Substance Use Topics  . Smoking status: Never Smoker   . Smokeless tobacco: Never Used  . Alcohol Use: No   OB History    No data available     Review of Systems  Constitutional: Negative.  Negative for fever and fatigue.  HENT: Negative.   Eyes: Negative.   Respiratory: Negative.   Cardiovascular: Negative.   Gastrointestinal: Negative.  Negative for nausea, vomiting, abdominal pain and diarrhea.  Endocrine: Negative.   Genitourinary: Positive for vaginal discharge. Negative for dysuria, urgency, frequency, flank pain, vaginal bleeding, genital sores, vaginal pain and menstrual problem.  Musculoskeletal: Negative.   Skin: Negative.   Allergic/Immunologic: Negative.   Neurological: Positive for numbness. Negative for weakness.       Intermittent numbness and tingling of right foot.   Hematological: Negative.   Psychiatric/Behavioral: Negative.     Allergies  Review of patient's allergies indicates no known allergies.  Home Medications   Prior  to Admission medications   Medication Sig Start Date End Date Taking? Authorizing Provider  Biotin 5000 MCG CAPS Take 5,000 mcg by mouth every morning.    Historical Provider, MD  diclofenac sodium (VOLTAREN) 1 % GEL Apply 2 g topically 4 (four) times daily. Patient not taking: Reported on 11/16/2014 09/14/14   Olga Millers, MD  docusate sodium (COLACE) 100 MG capsule Take 100 mg by mouth 2 (two) times daily as needed for mild constipation.    Historical Provider, MD  fluconazole (DIFLUCAN) 200 MG tablet Take 1 tablet (200 mg total)  by mouth once. 05/11/15   Nehemiah Settle, NP  gabapentin (NEURONTIN) 300 MG capsule Take 1 capsule (300 mg total) by mouth 2 (two) times daily. Patient not taking: Reported on 11/16/2014 09/27/14   Olga Millers, MD  hydroxypropyl methylcellulose (ISOPTO TEARS) 2.5 % ophthalmic solution Place 1 drop into both eyes daily as needed for dry eyes.    Historical Provider, MD  hydrOXYzine (ATARAX/VISTARIL) 25 MG tablet Take 1 tablet (25 mg total) by mouth 3 (three) times daily as needed for itching. 03/14/15   Audelia Hives Presson, PA  ipratropium (ATROVENT) 0.06 % nasal spray Place 2 sprays into both nostrils 4 (four) times daily. 11/15/14   Gregor Hams, MD  meloxicam (MOBIC) 7.5 MG tablet Take 1 tablet (7.5 mg total) by mouth daily. Patient not taking: Reported on 11/16/2014 10/13/14   Olga Millers, MD  metroNIDAZOLE (FLAGYL) 500 MG tablet Take 1 tablet (500 mg total) by mouth 2 (two) times daily. 05/11/15   Nehemiah Settle, NP  minocycline (MINOCIN) 100 MG capsule Take 1 capsule (100 mg total) by mouth 2 (two) times daily. 03/14/15   Audelia Hives Presson, PA  polyethylene glycol (MIRALAX / GLYCOLAX) packet Take 17 g by mouth daily. Patient not taking: Reported on 11/16/2014 02/27/14   Verlee Monte, MD  predniSONE (DELTASONE) 10 MG tablet Take 3 tablets (30 mg total) by mouth daily. 11/15/14   Gregor Hams, MD   BP 164/101 mmHg  Pulse 68  Temp(Src) 97.8 F (36.6 C) (Oral)  Resp 16  SpO2 99%   Physical Exam  Constitutional: She appears well-developed and well-nourished. No distress.  Cardiovascular: Normal rate, regular rhythm, normal heart sounds and intact distal pulses.  Exam reveals no gallop and no friction rub.   No murmur heard. Pulmonary/Chest: Effort normal and breath sounds normal. No respiratory distress. She has no wheezes. She has no rales. She exhibits no tenderness.  Genitourinary: Vaginal discharge found.  Skin: Skin is warm and dry. She is not diaphoretic.  Nursing note and  vitals reviewed.  The white to gray vaginal discharge present in vaginal vault. No cervix present negative for inguinal tenderness.  ED Course  Procedures (including critical care time) Labs Review Labs Reviewed  POCT URINALYSIS DIP (DEVICE)  CERVICOVAGINAL ANCILLARY ONLY   Results for orders placed or performed during the hospital encounter of 05/11/15  POCT urinalysis dip (device)  Result Value Ref Range   Glucose, UA NEGATIVE NEGATIVE mg/dL   Bilirubin Urine NEGATIVE NEGATIVE   Ketones, ur NEGATIVE NEGATIVE mg/dL   Specific Gravity, Urine 1.025 1.005 - 1.030   Hgb urine dipstick NEGATIVE NEGATIVE   pH 6.5 5.0 - 8.0   Protein, ur NEGATIVE NEGATIVE mg/dL   Urobilinogen, UA 0.2 0.0 - 1.0 mg/dL   Nitrite NEGATIVE NEGATIVE   Leukocytes, UA NEGATIVE NEGATIVE   GC/Clamydia, Affirm pending. Imaging Review No results found.  MDM   1. Bacterial vaginosis   2. Monilial vaginitis    Meds ordered this encounter  Medications  . fluconazole (DIFLUCAN) 200 MG tablet    Sig: Take 1 tablet (200 mg total) by mouth once.    Dispense:  1 tablet    Refill:  0  . metroNIDAZOLE (FLAGYL) 500 MG tablet    Sig: Take 1 tablet (500 mg total) by mouth 2 (two) times daily.    Dispense:  14 tablet    Refill:  0   The patient is to follow-up with her primary or provider regarding intermittent swelling of right lower extremity.  Likely bacterial vaginosis in addition to vaginal candida. Discussed prevention and good vaginal hygiene measures. The patient verbalizes understanding and agrees to plan of care.     Nehemiah Settle, NP 05/11/15 2139

## 2015-05-11 NOTE — Discharge Instructions (Signed)
FOLLOW UP WITH YOUR PRIMARY CARE PROVIDER REGARDING INTERMITTENT SWELLING OF RIGHT LOWER EXTREMITY.    You may use over the counter monistat to help with itching and burning symptoms as well.    Monilial Vaginitis Vaginitis in a soreness, swelling and redness (inflammation) of the vagina and vulva. Monilial vaginitis is not a sexually transmitted infection. CAUSES  Yeast vaginitis is caused by yeast (candida) that is normally found in your vagina. With a yeast infection, the candida has overgrown in number to a point that upsets the chemical balance. SYMPTOMS   White, thick vaginal discharge.  Swelling, itching, redness and irritation of the vagina and possibly the lips of the vagina (vulva).  Burning or painful urination.  Painful intercourse. DIAGNOSIS  Things that may contribute to monilial vaginitis are:  Postmenopausal and virginal states.  Pregnancy.  Infections.  Being tired, sick or stressed, especially if you had monilial vaginitis in the past.  Diabetes. Good control will help lower the chance.  Birth control pills.  Tight fitting garments.  Using bubble bath, feminine sprays, douches or deodorant tampons.  Taking certain medications that kill germs (antibiotics).  Sporadic recurrence can occur if you become ill. TREATMENT  Your caregiver will give you medication.  There are several kinds of anti monilial vaginal creams and suppositories specific for monilial vaginitis. For recurrent yeast infections, use a suppository or cream in the vagina 2 times a week, or as directed.  Anti-monilial or steroid cream for the itching or irritation of the vulva may also be used. Get your caregiver's permission.  Painting the vagina with methylene blue solution may help if the monilial cream does not work.  Eating yogurt may help prevent monilial vaginitis. HOME CARE INSTRUCTIONS   Finish all medication as prescribed.  Do not have sex until treatment is completed or  after your caregiver tells you it is okay.  Take warm sitz baths.  Do not douche.  Do not use tampons, especially scented ones.  Wear cotton underwear.  Avoid tight pants and panty hose.  Tell your sexual partner that you have a yeast infection. They should go to their caregiver if they have symptoms such as mild rash or itching.  Your sexual partner should be treated as well if your infection is difficult to eliminate.  Practice safer sex. Use condoms.  Some vaginal medications cause latex condoms to fail. Vaginal medications that harm condoms are:  Cleocin cream.  Butoconazole (Femstat).  Terconazole (Terazol) vaginal suppository.  Miconazole (Monistat) (may be purchased over the counter). SEEK MEDICAL CARE IF:   You have a temperature by mouth above 102 F (38.9 C).  The infection is getting worse after 2 days of treatment.  The infection is not getting better after 3 days of treatment.  You develop blisters in or around your vagina.  You develop vaginal bleeding, and it is not your menstrual period.  You have pain when you urinate.  You develop intestinal problems.  You have pain with sexual intercourse. Document Released: 08/06/2005 Document Revised: 01/19/2012 Document Reviewed: 04/20/2009 [facility] Patient Information 2015 [place], [place]. This information is not intended to replace advice given to you by your health care provider. Make sure you discuss any questions you have with your health care provider.

## 2015-05-15 LAB — CERVICOVAGINAL ANCILLARY ONLY
Chlamydia: NEGATIVE
Neisseria Gonorrhea: NEGATIVE

## 2015-05-15 NOTE — ED Notes (Signed)
Final report of STD screening negative for GC and chlamydia; still waiting for wet prep report

## 2015-05-16 LAB — CERVICOVAGINAL ANCILLARY ONLY: Wet Prep (BD Affirm): POSITIVE — AB

## 2015-05-17 NOTE — ED Notes (Signed)
Final report of wet prep positive for gardenerella. Provided w Rx at time of UCC to visit. All other reports negative. No further action required

## 2015-05-22 ENCOUNTER — Other Ambulatory Visit (HOSPITAL_BASED_OUTPATIENT_CLINIC_OR_DEPARTMENT_OTHER): Payer: BLUE CROSS/BLUE SHIELD

## 2015-05-22 ENCOUNTER — Ambulatory Visit (HOSPITAL_BASED_OUTPATIENT_CLINIC_OR_DEPARTMENT_OTHER): Payer: BLUE CROSS/BLUE SHIELD | Admitting: Oncology

## 2015-05-22 ENCOUNTER — Telehealth: Payer: Self-pay | Admitting: Oncology

## 2015-05-22 VITALS — BP 143/88 | HR 72 | Temp 97.9°F | Resp 20 | Ht 71.0 in | Wt 225.9 lb

## 2015-05-22 DIAGNOSIS — Z85038 Personal history of other malignant neoplasm of large intestine: Secondary | ICD-10-CM | POA: Diagnosis not present

## 2015-05-22 DIAGNOSIS — M79671 Pain in right foot: Secondary | ICD-10-CM

## 2015-05-22 DIAGNOSIS — C189 Malignant neoplasm of colon, unspecified: Secondary | ICD-10-CM

## 2015-05-22 NOTE — Telephone Encounter (Signed)
Pt confirmed labs/ov per 07/12 POF, gave pt AVS and Calendar... KJ °
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
## 2015-05-23 ENCOUNTER — Telehealth: Payer: Self-pay | Admitting: *Deleted

## 2015-05-23 LAB — CEA: CEA: 1.3 ng/mL (ref 0.0–5.0)

## 2015-05-23 NOTE — Telephone Encounter (Signed)
Called and informed patient of normal cea.  Per Dr. Sherrill.  Patient verbalized understanding.  

## 2015-05-23 NOTE — Telephone Encounter (Signed)
-----   Message from Ladell Pier, MD sent at 05/23/2015  3:58 PM EDT ----- Please call patient, cea is normal

## 2015-05-25 ENCOUNTER — Telehealth: Payer: Self-pay | Admitting: Pulmonary Disease

## 2015-05-25 NOTE — Telephone Encounter (Signed)
Last OV 09/26/2014 Poor usage CPAP 7cm works well At least 4-6 hours every night expected

## 2015-05-25 NOTE — Telephone Encounter (Signed)
lmtcb

## 2015-05-28 NOTE — Telephone Encounter (Signed)
lmtcb

## 2015-05-29 NOTE — Telephone Encounter (Signed)
lmtcb

## 2015-05-31 NOTE — Telephone Encounter (Signed)
Attempted to contact patient, left message on voicemail.  Sent unable to reach letter.closing encounter

## 2015-06-04 ENCOUNTER — Telehealth: Payer: Self-pay | Admitting: Pulmonary Disease

## 2015-06-04 NOTE — Telephone Encounter (Signed)
Mary [name], CMA at 05/25/2015 12:15 PM     Status: Signed       Expand All Collapse All   Last OV 09/26/2014 Poor usage CPAP 7cm works well At least 4-6 hours every night expected        lmomtcb x1

## 2015-06-06 NOTE — Telephone Encounter (Signed)
lmtcb x2 for pt. 

## 2015-06-07 ENCOUNTER — Telehealth: Payer: Self-pay | Admitting: Pulmonary Disease

## 2015-06-07 ENCOUNTER — Ambulatory Visit: Payer: BLUE CROSS/BLUE SHIELD | Admitting: Pulmonary Disease

## 2015-06-07 NOTE — Telephone Encounter (Signed)
Patient says that she drinks a lot of water during the day, so she gets up a lot in the middle of the night to go to the bathroom.  She says that she tries to wear the CPAP 4 hours every night, she says that she thinks it is leaking and needs to have it checked.  She is going to take machine to [facility] to get them to look at it to check for leaks.  She says that the CPAP is helping a lot, snoring has gotten much better.  Patient says that she will try to use machine more than 4 hours.  RA - do you want me to order another download in 4 weeks?

## 2015-06-07 NOTE — Telephone Encounter (Addendum)
lmtcb x3. Per our protocol, this message will be closed. If/when pt calls back a new message will need to be created.

## 2015-06-08 NOTE — Telephone Encounter (Signed)
lmtcb

## 2015-06-08 NOTE — Telephone Encounter (Signed)
Do not need to contact patient, patient has already been informed. Will obtain download in 4 weeks. Nothing further needed.

## 2015-06-08 NOTE — Telephone Encounter (Signed)
Yes

## 2015-06-08 NOTE — Telephone Encounter (Signed)
Pt returned call [phone]

## 2015-06-08 NOTE — Telephone Encounter (Signed)
lmtcb for pt.   Pt is in [place]. After speaking with pt to inform her, will need to send message to Sharyn Lull to get download off [employer] in 4 weeks.

## 2015-07-04 ENCOUNTER — Telehealth: Payer: Self-pay | Admitting: Pulmonary Disease

## 2015-07-04 NOTE — Telephone Encounter (Signed)
Patient says she has been having problems with her right foot, neuropathy from Cancer.  When she went to Oncologist, Oncologist says that she needs referral for Neurologist.  She said that Oncologist could not refer her because he only handles her Cancer.  She says that Dr. Elsworth Soho is her PCP and she wants to get referral from Dr. Elsworth Soho to get appointment as soon as possible.  Patient can barely walk because of pain and swelling in foot.   CPAP issue: Patient says that they received a report that patient was only using 63% of the time and is required to wear over 70% of time.  This issue has been discussed with patient.  Patient is doing better on the CPAP and we will obtain download in a few weeks to check on patient's progress.  Patient is currently at 63% >=4 hours; wears 29 days out of 30.    Dr. Elsworth Soho, please advise on Referral request. Sent message to [facility] regarding CPAP issue

## 2015-07-04 NOTE — Telephone Encounter (Signed)
Defer to PCP

## 2015-07-05 ENCOUNTER — Telehealth: Payer: Self-pay | Admitting: Internal Medicine

## 2015-07-05 NOTE — Telephone Encounter (Signed)
Patient stated that her Cancer Dr told her that she need to be referral for her foot, soon as possible, please advise

## 2015-07-05 NOTE — Telephone Encounter (Signed)
Try her    [phone] cell first if not call w [phone] pt calling back

## 2015-07-05 NOTE — Telephone Encounter (Signed)
Per Melissa:  We called this patient and advised her she MUST use her machine for 10 more days with NO BREAKS in order to meet compliance for insurance to cover unit and supplies.   She can wait until then to get supplies through her insurance or she could private pay for them now.   Per Dr. Elsworth Soho:  Defer to PCP for Neurology referral --------- lmtcb.

## 2015-07-05 NOTE — Telephone Encounter (Signed)
Called and spoke to pt. Informed her of the recs per RA. Pt saw Dr. Doug Sou in the past, phone number given to pt for PC at [place] the CPAP rec per Melissa. Pt verbalized understanding and denied any further questions or concerns at this time.

## 2015-07-05 NOTE — Telephone Encounter (Signed)
Pt returned call  [phone]

## 2015-07-05 NOTE — Telephone Encounter (Signed)
Received message from [facility] saying that she is checking on this issue and will let me know, awaiting response from [facility] Per Dr. Elsworth Soho, defer Neurology referral request to PCP Left message for patient to call back.

## 2015-07-05 NOTE — Telephone Encounter (Signed)
lmomtcb x1 for pt 

## 2015-07-06 NOTE — Telephone Encounter (Signed)
Patient is returning your call, if no one answer do not leave a message  Just keep trying to call and ask for her.

## 2015-07-06 NOTE — Telephone Encounter (Signed)
Left message for patient to call me back. 

## 2015-07-06 NOTE — Telephone Encounter (Signed)
Pt called again. You can reach her at [phone]

## 2015-07-06 NOTE — Telephone Encounter (Signed)
Left message

## 2015-07-09 NOTE — Telephone Encounter (Signed)
Has not been seen here in almost 1 year and needs visit for evaluation of her foot problem.

## 2015-07-09 NOTE — Telephone Encounter (Signed)
Spoke with patient and she will schedule an appointment.

## 2015-07-17 ENCOUNTER — Ambulatory Visit (INDEPENDENT_AMBULATORY_CARE_PROVIDER_SITE_OTHER): Payer: BLUE CROSS/BLUE SHIELD | Admitting: Internal Medicine

## 2015-07-17 ENCOUNTER — Encounter: Payer: Self-pay | Admitting: Internal Medicine

## 2015-07-17 VITALS — BP 130/82 | HR 77 | Temp 97.7°F | Resp 14 | Ht 71.0 in | Wt 226.1 lb

## 2015-07-17 DIAGNOSIS — E669 Obesity, unspecified: Secondary | ICD-10-CM | POA: Diagnosis not present

## 2015-07-17 DIAGNOSIS — M79671 Pain in right foot: Secondary | ICD-10-CM | POA: Diagnosis not present

## 2015-07-17 DIAGNOSIS — M79672 Pain in left foot: Secondary | ICD-10-CM | POA: Diagnosis not present

## 2015-07-17 DIAGNOSIS — R7301 Impaired fasting glucose: Secondary | ICD-10-CM

## 2015-07-17 NOTE — Assessment & Plan Note (Signed)
Weight down 20-30 pounds since last visit. Will check HgA1c since she is here but suspect it will be improved from prior.

## 2015-07-17 NOTE — Progress Notes (Signed)
   Subjective:    Patient ID: Mary [name], female    DOB: 06-20-1961, 54 y.o.   MRN: [id number]  HPI The patient is a 54 YO female coming in for right foot pain. She has seen orthopedics in the past and previously has tried gabapentin and lyrica (without good relief). She then had been on percocet for her feet. This was discontinued last year. She was supposed to get a brace at our last visit and did but it did not help. Would like to go see a foot specialist. Her cancer doctor does not think that the neuropathy has affected the foot this much. Worse first thing in the morning walking on it. Worse also after standing on it for extended periods (stands for 10-12 hours at work daily). Has been working on losing weight and has lost about 30 pounds since last year. No other new complaints.   Review of Systems  Constitutional: Negative for fever, activity change, appetite change, fatigue and unexpected weight change.  HENT: Negative.   Respiratory: Negative for cough, chest tightness, shortness of breath and wheezing.   Cardiovascular: Negative for chest pain, palpitations and leg swelling.  Gastrointestinal: Negative for abdominal pain, diarrhea, constipation, blood in stool and abdominal distention.  Musculoskeletal: Positive for arthralgias. Negative for myalgias, back pain and gait problem.  Skin: Negative.   Neurological: Negative for dizziness, weakness, light-headedness and headaches.  Psychiatric/Behavioral: Negative.       Objective:   Physical Exam  Constitutional: She is oriented to person, place, and time. She appears well-developed and well-nourished. No distress.  HENT:  Head: Normocephalic and atraumatic.  Eyes: EOM are normal.  Neck: Normal range of motion.  Cardiovascular: Normal rate and regular rhythm.   Pulmonary/Chest: Effort normal and breath sounds normal. No respiratory distress. She has no wheezes. She has no rales.  Abdominal: Soft. Bowel sounds are normal. She  exhibits no distension. There is no tenderness. There is no rebound.  Musculoskeletal: She exhibits tenderness.  Tenderness on the plantar surface of the foot as well as the ankle on the right side greater than left  Neurological: She is alert and oriented to person, place, and time. Coordination normal.  Skin: Skin is warm and dry.   Filed Vitals:   07/17/15 0804  BP: 130/82  Pulse: 77  Temp: 97.7 F (36.5 C)  TempSrc: Oral  Resp: 14  Height: 5\' 11"  (1.803 m)  Weight: 226 lb 1.9 oz (102.567 kg)  SpO2: 98%      Assessment & Plan:

## 2015-07-17 NOTE — Patient Instructions (Signed)
We will check the blood work to make sure your sugars have come down since last year.   We will get you in with the neurologist as well as the sports medicine doctor, Dr. Charlann Boxer here.   Come back in about 6-12 months for a check up.   Plantar Fasciitis (Heel Spur Syndrome) with Rehab The plantar fascia is a fibrous, ligament-like, soft-tissue structure that spans the bottom of the foot. Plantar fasciitis is a condition that causes pain in the foot due to inflammation of the tissue. SYMPTOMS   Pain and tenderness on the underneath side of the foot.  Pain that worsens with standing or walking. CAUSES  Plantar fasciitis is caused by irritation and injury to the plantar fascia on the underneath side of the foot. Common mechanisms of injury include:  Direct trauma to bottom of the foot.  Damage to a small nerve that runs under the foot where the main fascia attaches to the heel bone.  Stress placed on the plantar fascia due to bone spurs. RISK INCREASES WITH:   Activities that place stress on the plantar fascia (running, jumping, pivoting, or cutting).  Poor strength and flexibility.  Improperly fitted shoes.  Tight calf muscles.  Flat feet.  Failure to warm-up properly before activity.  Obesity. PREVENTION  Warm up and stretch properly before activity.  Allow for adequate recovery between workouts.  Maintain physical fitness:  Strength, flexibility, and endurance.  Cardiovascular fitness.  Maintain a health body weight.  Avoid stress on the plantar fascia.  Wear properly fitted shoes, including arch supports for individuals who have flat feet. PROGNOSIS  If treated properly, then the symptoms of plantar fasciitis usually resolve without surgery. However, occasionally surgery is necessary. RELATED COMPLICATIONS   Recurrent symptoms that may result in a chronic condition.  Problems of the lower back that are caused by compensating for the injury, such as  limping.  Pain or weakness of the foot during push-off following surgery.  Chronic inflammation, scarring, and partial or complete fascia tear, occurring more often from repeated injections. TREATMENT  Treatment initially involves the use of ice and medication to help reduce pain and inflammation. The use of strengthening and stretching exercises may help reduce pain with activity, especially stretches of the Achilles tendon. These exercises may be performed at home or with a therapist. Your caregiver may recommend that you use heel cups of arch supports to help reduce stress on the plantar fascia. Occasionally, corticosteroid injections are given to reduce inflammation. If symptoms persist for greater than 6 months despite non-surgical (conservative), then surgery may be recommended.  MEDICATION   If pain medication is necessary, then nonsteroidal anti-inflammatory medications, such as aspirin and ibuprofen, or other minor pain relievers, such as acetaminophen, are often recommended.  Do not take pain medication within 7 days before surgery.  Prescription pain relievers may be given if deemed necessary by your caregiver. Use only as directed and only as much as you need.  Corticosteroid injections may be given by your caregiver. These injections should be reserved for the most serious cases, because they may only be given a certain number of times. HEAT AND COLD  Cold treatment (icing) relieves pain and reduces inflammation. Cold treatment should be applied for 10 to 15 minutes every 2 to 3 hours for inflammation and pain and immediately after any activity that aggravates your symptoms. Use ice packs or massage the area with a piece of ice (ice massage).  Heat treatment may be used prior  to performing the stretching and strengthening activities prescribed by your caregiver, physical therapist, or athletic trainer. Use a heat pack or soak the injury in warm water. SEEK IMMEDIATE MEDICAL CARE  IF:  Treatment seems to offer no benefit, or the condition worsens.  Any medications produce adverse side effects. EXERCISES RANGE OF MOTION (ROM) AND STRETCHING EXERCISES - Plantar Fasciitis (Heel Spur Syndrome) These exercises may help you when beginning to rehabilitate your injury. Your symptoms may resolve with or without further involvement from your physician, physical therapist or athletic trainer. While completing these exercises, remember:   Restoring tissue flexibility helps normal motion to return to the joints. This allows healthier, less painful movement and activity.  An effective stretch should be held for at least 30 seconds.  A stretch should never be painful. You should only feel a gentle lengthening or release in the stretched tissue. RANGE OF MOTION - Toe Extension, Flexion  Sit with your right / left leg crossed over your opposite knee.  Grasp your toes and gently pull them back toward the top of your foot. You should feel a stretch on the bottom of your toes and/or foot.  Hold this stretch for __________ seconds.  Now, gently pull your toes toward the bottom of your foot. You should feel a stretch on the top of your toes and or foot.  Hold this stretch for __________ seconds. Repeat __________ times. Complete this stretch __________ times per day.  RANGE OF MOTION - Ankle Dorsiflexion, Active Assisted  Remove shoes and sit on a chair that is preferably not on a carpeted surface.  Place right / left foot under knee. Extend your opposite leg for support.  Keeping your heel down, slide your right / left foot back toward the chair until you feel a stretch at your ankle or calf. If you do not feel a stretch, slide your bottom forward to the edge of the chair, while still keeping your heel down.  Hold this stretch for __________ seconds. Repeat __________ times. Complete this stretch __________ times per day.  STRETCH - Gastroc, Standing  Place hands on  wall.  Extend right / left leg, keeping the front knee somewhat bent.  Slightly point your toes inward on your back foot.  Keeping your right / left heel on the floor and your knee straight, shift your weight toward the wall, not allowing your back to arch.  You should feel a gentle stretch in the right / left calf. Hold this position for __________ seconds. Repeat __________ times. Complete this stretch __________ times per day. STRETCH - Soleus, Standing  Place hands on wall.  Extend right / left leg, keeping the other knee somewhat bent.  Slightly point your toes inward on your back foot.  Keep your right / left heel on the floor, bend your back knee, and slightly shift your weight over the back leg so that you feel a gentle stretch deep in your back calf.  Hold this position for __________ seconds. Repeat __________ times. Complete this stretch __________ times per day. STRETCH - Gastrocsoleus, Standing  Note: This exercise can place a lot of stress on your foot and ankle. Please complete this exercise only if specifically instructed by your caregiver.   Place the ball of your right / left foot on a step, keeping your other foot firmly on the same step.  Hold on to the wall or a rail for balance.  Slowly lift your other foot, allowing your body weight to press  your heel down over the edge of the step.  You should feel a stretch in your right / left calf.  Hold this position for __________ seconds.  Repeat this exercise with a slight bend in your right / left knee. Repeat __________ times. Complete this stretch __________ times per day.  STRENGTHENING EXERCISES - Plantar Fasciitis (Heel Spur Syndrome)  These exercises may help you when beginning to rehabilitate your injury. They may resolve your symptoms with or without further involvement from your physician, physical therapist or athletic trainer. While completing these exercises, remember:   Muscles can gain both the  endurance and the strength needed for everyday activities through controlled exercises.  Complete these exercises as instructed by your physician, physical therapist or athletic trainer. Progress the resistance and repetitions only as guided. STRENGTH - Towel Curls  Sit in a chair positioned on a non-carpeted surface.  Place your foot on a towel, keeping your heel on the floor.  Pull the towel toward your heel by only curling your toes. Keep your heel on the floor.  If instructed by your physician, physical therapist or athletic trainer, add ____________________ at the end of the towel. Repeat __________ times. Complete this exercise __________ times per day. STRENGTH - Ankle Inversion  Secure one end of a rubber exercise band/tubing to a fixed object (table, pole). Loop the other end around your foot just before your toes.  Place your fists between your knees. This will focus your strengthening at your ankle.  Slowly, pull your big toe up and in, making sure the band/tubing is positioned to resist the entire motion.  Hold this position for __________ seconds.  Have your muscles resist the band/tubing as it slowly pulls your foot back to the starting position. Repeat __________ times. Complete this exercises __________ times per day.  Document Released: 10/27/2005 Document Revised: 01/19/2012 Document Reviewed: 02/08/2009 [facility] Patient Information 2015 [place], [place]. This information is not intended to replace advice given to you by your health care provider. Make sure you discuss any questions you have with your health care provider.

## 2015-07-17 NOTE — Progress Notes (Signed)
Pre visit review using our clinic review tool, if applicable. No additional management support is needed unless otherwise documented below in the visit note. 

## 2015-07-17 NOTE — Assessment & Plan Note (Signed)
Referral to neurology and to sports medicine. I suspect plantar fasciitis in the right foot. Talked to her about stretching. Has previously had injection, tried insole and foot brace without luck. Worse with prolonged standing. Has tried meloxicam in the past and still using as needed now.

## 2015-08-09 ENCOUNTER — Ambulatory Visit: Payer: BLUE CROSS/BLUE SHIELD | Admitting: Family Medicine

## 2015-08-12 ENCOUNTER — Other Ambulatory Visit: Payer: Self-pay | Admitting: Family Medicine

## 2015-08-15 ENCOUNTER — Other Ambulatory Visit (INDEPENDENT_AMBULATORY_CARE_PROVIDER_SITE_OTHER): Payer: BLUE CROSS/BLUE SHIELD

## 2015-08-15 ENCOUNTER — Ambulatory Visit (INDEPENDENT_AMBULATORY_CARE_PROVIDER_SITE_OTHER): Payer: BLUE CROSS/BLUE SHIELD | Admitting: Family Medicine

## 2015-08-15 ENCOUNTER — Encounter: Payer: Self-pay | Admitting: Family Medicine

## 2015-08-15 ENCOUNTER — Ambulatory Visit (INDEPENDENT_AMBULATORY_CARE_PROVIDER_SITE_OTHER): Payer: BLUE CROSS/BLUE SHIELD | Admitting: Neurology

## 2015-08-15 ENCOUNTER — Encounter: Payer: Self-pay | Admitting: Neurology

## 2015-08-15 VITALS — BP 140/90 | HR 68 | Ht 71.0 in | Wt 222.1 lb

## 2015-08-15 VITALS — BP 134/84 | HR 76 | Ht 71.0 in | Wt 223.0 lb

## 2015-08-15 DIAGNOSIS — M79671 Pain in right foot: Secondary | ICD-10-CM | POA: Diagnosis not present

## 2015-08-15 DIAGNOSIS — G620 Drug-induced polyneuropathy: Secondary | ICD-10-CM

## 2015-08-15 DIAGNOSIS — M722 Plantar fascial fibromatosis: Secondary | ICD-10-CM | POA: Diagnosis not present

## 2015-08-15 DIAGNOSIS — T451X5A Adverse effect of antineoplastic and immunosuppressive drugs, initial encounter: Secondary | ICD-10-CM | POA: Diagnosis not present

## 2015-08-15 DIAGNOSIS — S92001A Unspecified fracture of right calcaneus, initial encounter for closed fracture: Secondary | ICD-10-CM | POA: Diagnosis not present

## 2015-08-15 MED ORDER — VITAMIN D (ERGOCALCIFEROL) 1.25 MG (50000 UNIT) PO CAPS: 50000.0000 [IU] | ORAL_CAPSULE | ORAL | Status: DC

## 2015-08-15 MED ORDER — NORTRIPTYLINE HCL 10 MG PO CAPS: ORAL_CAPSULE | ORAL | Status: DC

## 2015-08-15 NOTE — Assessment & Plan Note (Signed)
Patient does have what seems to be a calcaneal stress fracture. We discussed icing regimen, home exercises, as well as patient will be put in a Cam Walker. Patient will do high-dose vitamin D supplementation with her having a history of colon cancer. I'm hoping that this helped with some of the healing process. Discussed with patient that this will likely take 3-6 weeks to heal completely. Patient come back in 3 weeks for further evaluation.

## 2015-08-15 NOTE — Patient Instructions (Addendum)
1.  Start nortriptyline 10mg  at bedtime for two weeks, then increase to 2 tablets. 2.  EMG of the right > left leg 3.  Return to clinic in 4 months

## 2015-08-15 NOTE — Progress Notes (Signed)
[facility] Clinic Note - Initial Visit   Date: 08/15/2015  Mary [name] MRN: [id number] DOB: 1961/04/11   Dear Dr. Doug Sou:  Thank you for your kind referral of Mary [name] for consultation of right foot pain. Although her history is well known to you, please allow [place] to reiterate it for the purpose of our medical record. The patient was accompanied to the clinic by self.    History of Present Illness: Mary [name] is a 54 y.o. right-handed African American female with stage III adenocarcinoma of the sigmoid colon s/p partial colectomy (2012) and chemotherapy and GERD presenting for evaluation of right foot pain.    She was diagnosed with colon cancer in 2012 and underwent chemotherapy with oxaliplatin and 5-FU, but about one year following her chemotherapy, she developed hand and feet numbness/tingling.  Due to neuropathy oxaliplatin was discontinued and she had resolution of symptoms in the hands, but she has persistent paresthesias of the soles of feet, worse on the right.  Pain is described as numbness/tingling and intermittent hot/cold sensation.  It is there constant and has not noticed any exacerbating or alleviating factors.   Starting around May 2016, she developed a different pain at the heel of her right foot, described as achy and sharp which is worse with weight bearing.  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 There is associated swelling sometimes.  She has tried insoles and foot brace and has previously been on gabapentin, Cymbalta, and Lyrica which did not provide any benefit.  She denies any back pain.    She has been evaluated orthopeadics and was told she had pes planus and plantar fascitis.  She has received steroid injection which helps for a few weeks, but then the pain returned.    Out-side paper records, electronic medical record, and images have  been reviewed where available and summarized as:  Lab Results  Component Value Date   HGBA1C 6.6* 09/14/2014   CT cervical spine 01/18/2012:  Normal CT head wo contrast 01/18/2012:  Normal   Past Medical History  Diagnosis Date  . Blood transfusion   . History of migraine headaches   . iron deficiency anemia   . Multiple hemangiomas     Liver  . Renal cysts, acquired, bilateral   . Pneumothorax 07/19/11    Bilateral  . Arm DVT (deep venous thromboembolism), acute ([place]) 07/04/11    r/t PAC  . Neuropathy due to drug ([place]) 2012    Early--assoc. w/prolonged cold sensitivity--Oxaliplatin related  . GERD (gastroesophageal reflux disease)   . Incisional hernia   . Adenocarcinoma sigmoid colon 05/12/11    Stage III(pT2pN2b) descending sigmoid    Past Surgical History  Procedure Laterality Date  . Abdominal hysterectomy    . Colon surgery  05/17/11    Partial colectomy  . Portacath placement  07/20/11    Right side placed  . Portacath placement      left side & right side  . Port-a-cath removal  05/06/2012    Procedure: MINOR REMOVAL PORT-A-CATH;  Surgeon: Gwenyth Ober, MD;  Location: [facility];  Service: General;  Laterality: Right;  . Ventral hernia repair  09/06/2012    Procedure: LAPAROSCOPIC VENTRAL HERNIA;  Surgeon: Gwenyth Ober, MD;  Location: [facility];  Service: General;  Laterality: N/A;  . Insertion of mesh  09/06/2012  Procedure: INSERTION OF MESH;  Surgeon: Gwenyth Ober, MD;  Location: [facility];  Service: General;  Laterality: N/A;  . Hernia repair  09/06/12    lap incisional hernia     Medications:  Outpatient Encounter Prescriptions as of 08/15/2015  Medication Sig Note  . docusate sodium (COLACE) 100 MG capsule Take 100 mg by mouth 2 (two) times daily as needed for mild constipation.   . hydroxypropyl methylcellulose (ISOPTO TEARS) 2.5 % ophthalmic solution Place 1 drop into both eyes daily as needed for dry eyes.   . hydrOXYzine (ATARAX/VISTARIL) 25 MG  tablet Take 1 tablet (25 mg total) by mouth 3 (three) times daily as needed for itching.   Marland Kitchen ipratropium (ATROVENT) 0.06 % nasal spray Place 2 sprays into both nostrils 4 (four) times daily.   . polyethylene glycol (MIRALAX / GLYCOLAX) packet Take 17 g by mouth daily. (Patient taking differently: Take 17 g by mouth daily as needed. ) 05/09/2014: As needed  . topiramate (TOPAMAX) 100 MG tablet Take 200 mg by mouth at bedtime as needed. 05/22/2015: Received from: External Pharmacy Received Sig:   . nortriptyline (PAMELOR) 10 MG capsule Start 1 tablet at bedtime for two week, then increase to 2 tablets.    No facility-administered encounter medications on file as of 08/15/2015.     Allergies: No Known Allergies  Family History: Family History  Problem Relation Age of Onset  . Cancer Maternal Uncle     lung  . Cancer Maternal Uncle     colon  . Cancer Maternal Uncle     prostate  . Diabetes Mother   . Hypertension Mother   . Cancer Father     leukemia  . Cancer Brother     Deceased  . Other Brother     MVA  . Healthy Sister   . Healthy Daughter     x 3    Social History: Social History  Substance Use Topics  . Smoking status: Never Smoker   . Smokeless tobacco: Never Used  . Alcohol Use: No   Social History   Social History Narrative   Lives with husband in a 1 story home.  Has 3 daughters and 7 grandkids and one on the way.     Works as a Merchandiser, retail.    Education: high school.    Review of Systems:  CONSTITUTIONAL: No fevers, chills, night sweats, or weight loss.   EYES: No visual changes or eye pain ENT: No hearing changes.  No history of nose bleeds.   RESPIRATORY: No cough, wheezing and shortness of breath.   CARDIOVASCULAR: Negative for chest pain, and palpitations.   GI: Negative for abdominal discomfort, blood in stools or black stools.  No recent change in bowel habits.   GU:  No history of incontinence.   MUSCLOSKELETAL: +history of joint pain or  swelling.  No myalgias.   SKIN: Negative for lesions, rash, and itching.   HEMATOLOGY/ONCOLOGY: Negative for prolonged bleeding, bruising easily, and swollen nodes.  No history of cancer.   ENDOCRINE: Negative for cold or heat intolerance, polydipsia or goiter.   PSYCH:  +depression or anxiety symptoms.   NEURO: As Above.   Vital Signs:  BP 140/90 mmHg  Pulse 68  Ht 5\' 11"  (1.803 m)  Wt 222 lb 2 oz (100.755 kg)  BMI 30.99 kg/m2  SpO2 99%   General Medical Exam:   General:  Well appearing, comfortable.   Eyes/ENT: see cranial nerve examination.   Neck: No  masses appreciated.  Full range of motion without tenderness.  No carotid bruits. Respiratory:  Clear to auscultation, good air entry bilaterally.   Cardiac:  Regular rate and rhythm, no murmur.   Extremities:  Bilateral pes planus.  No deformities, edema, or skin discoloration.  Skin:  No rashes or lesions.  Neurological Exam: MENTAL STATUS including orientation to time, place, person, recent and remote memory, attention span and concentration, language, and fund of knowledge is normal.  Speech is not dysarthric.  CRANIAL NERVES: II:  No visual field defects.  Unremarkable fundi.   III-IV-VI: Pupils equal round and reactive to light. Bilateral proptosis.  Normal conjugate, extra-ocular eye movements in all directions of gaze.  No nystagmus.    V:  Normal facial sensation.    VII:  Normal facial symmetry and movements.  No pathologic facial reflexes.  VIII:  Normal hearing and vestibular function.   IX-X:  Normal palatal movement.   XI:  Normal shoulder shrug and head rotation.   XII:  Normal tongue strength and range of motion, no deviation or fasciculation.  MOTOR:  No atrophy, fasciculations or abnormal movements.  No pronator drift.  Tone is normal.    Right Upper Extremity:    Left Upper Extremity:    Deltoid  5/5   Deltoid  5/5   Biceps  5/5   Biceps  5/5   Triceps  5/5   Triceps  5/5   Wrist extensors  5/5   Wrist  extensors  5/5   Wrist flexors  5/5   Wrist flexors  5/5   Finger extensors  5/5   Finger extensors  5/5   Finger flexors  5/5   Finger flexors  5/5   Dorsal interossei  5/5   Dorsal interossei  5/5   Abductor pollicis  5/5   Abductor pollicis  5/5   Tone (Ashworth scale)  0  Tone (Ashworth scale)  0   Right Lower Extremity:    Left Lower Extremity:    Hip flexors  5/5   Hip flexors  5/5   Hip extensors  5/5   Hip extensors  5/5   Knee flexors  5/5   Knee flexors  5/5   Knee extensors  5/5   Knee extensors  5/5   Dorsiflexors  5/5   Dorsiflexors  5/5   Plantarflexors  5/5   Plantarflexors  5/5   Toe extensors  5/5   Toe extensors  5/5   Toe flexors  5/5   Toe flexors  5/5   Tone (Ashworth scale)  0  Tone (Ashworth scale)  0   MSRs:  Right                                                                 Left brachioradialis 2+  brachioradialis 2+  biceps 2+  biceps 2+  triceps 2+  triceps 2+  patellar 2+  patellar 2+  ankle jerk 0  ankle jerk 0  Hoffman no  Hoffman no  plantar response down  plantar response down   SENSORY:  Normal and symmetric perception of light touch, pinprick, vibration, and proprioception. Point tenderness over right heel.   COORDINATION/GAIT: Normal finger-to- nose-finger and heel-to-shin.  Intact rapid alternating movements bilaterally.Gait is antalgic due  to right foot pain.   IMPRESSION: Mrs. Carolan is a 54 year-old female with history of colon cancer s/p chemotherapy and resection (2012) who has persistent paresthesias of the feet and worsening new right heel pain over the past year.  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  She has tried a number of medications including gabapentin, Lyrica, and Cymbalta without relief.  I will offer nortriptyline to see if this helps.    Her right heel  pain, characterized as achy and sharp and has previously responded to steroid injection seems more suggestive of tendonitis or plantar fascitis.   I am happy to perform NCS/EMG on the legs, to be sure S1 radiculopathy is not missed, but since she has no radicular pain, this seems unlikely.  I suspect there will be some degree of neuropathy, but my in opinion, it would still not explain her achy heel pain.   PLAN/RECOMMENDATIONS:  1.  Start nortriptyline 10mg  at bedtime for two weeks, then increase to 2 tablets 2.  EMG of the right > left leg 3.  She is frustrated at the lack of pain control and may benefit from pain management 4.  Return to clinic in 4 months.   The duration of this appointment visit was 45 minutes of face-to-face time with the patient.  Greater than 50% of this time was spent in counseling, explanation of diagnosis, planning of further management, and coordination of care.   Thank you for allowing me to participate in patient's care.  If I can answer any additional questions, I would be pleased to do so.    Sincerely,    Donika K. Posey Pronto, DO

## 2015-08-15 NOTE — Progress Notes (Signed)
[name] [address], [place] [address] Phone: [phone] Subjective:    I'm seeing this patient by the request  of:  Hoyt Koch, MD   CC: right foot pain.   [id number] Mary [name] is a 54 y.o. female coming in with complaint of right foot pain. Patient is had this pain for multiple weeks if not months. Does not remember any injury. States that it is more on the heel. Seems to be on the plantar aspect. Worse with activity as well as with standing. States that that she does get swelling of the ankle as well. States that it seems to be worsening. Denies any nighttime awakening. Denies any radiation towards the toes but does have peripheral neuropathy secondary to her other comorbidities. Patient has tried changing shoes without any improvement.  Past Medical History  Diagnosis Date  . Blood transfusion   . History of migraine headaches   . iron deficiency anemia   . Multiple hemangiomas     Liver  . Renal cysts, acquired, bilateral   . Pneumothorax 07/19/11    Bilateral  . Arm DVT (deep venous thromboembolism), acute ([facility]) 07/04/11    r/t PAC  . Neuropathy due to drug ([facility] [place]) 2012    Early--assoc. w/prolonged cold sensitivity--Oxaliplatin related  . GERD (gastroesophageal reflux disease)   . Incisional hernia   . Adenocarcinoma sigmoid colon 05/12/11    Stage III(pT2pN2b) descending sigmoid   Past Surgical History  Procedure Laterality Date  . Abdominal hysterectomy    . Colon surgery  05/17/11    Partial colectomy  . Portacath placement  07/20/11    Right side placed  . Portacath placement      left side & right side  . Port-a-cath removal  05/06/2012    Procedure: MINOR REMOVAL PORT-A-CATH;  Surgeon: Gwenyth Ober, MD;  Location: [facility];  Service: General;  Laterality: Right;  . Ventral hernia repair  09/06/2012    Procedure: LAPAROSCOPIC VENTRAL HERNIA;  Surgeon: Gwenyth Ober, MD;  Location: [facility];   Service: General;  Laterality: N/A;  . Insertion of mesh  09/06/2012    Procedure: INSERTION OF MESH;  Surgeon: Gwenyth Ober, MD;  Location: [facility];  Service: General;  Laterality: N/A;  . Hernia repair  09/06/12    lap incisional hernia   Social History  Substance Use Topics  . Smoking status: Never Smoker   . Smokeless tobacco: Never Used  . Alcohol Use: No   No Known Allergies Family History  Problem Relation Age of Onset  . Cancer Maternal Uncle     lung  . Cancer Maternal Uncle     colon  . Cancer Maternal Uncle     prostate  . Diabetes Mother   . Hypertension Mother   . Cancer Father     leukemia  . Cancer Brother     Deceased  . Other Brother     MVA  . Healthy Sister   . Healthy Daughter     x 3        Past medical history, social, surgical and family history all reviewed in electronic medical record.   Review of Systems: No headache, visual changes, nausea, vomiting, diarrhea, constipation, dizziness, abdominal pain, skin rash, fevers, chills, night sweats, weight loss, swollen lymph nodes, body aches, joint swelling, muscle aches, chest pain, shortness of breath, mood changes.   Objective Blood pressure 134/84, pulse 76, height 5\' 11"  (1.803  m), weight 223 lb (101.152 kg), SpO2 97 %.  General: No apparent distress alert and oriented x3 mood and affect normal, dressed appropriately.  HEENT: Pupils equal, extraocular movements intact  Respiratory: Patient's speak in full sentences and does not appear short of breath  Cardiovascular: No lower extremity edema, non tender, no erythema  Skin: Warm dry intact with no signs of infection or rash on extremities or on axial skeleton.  Abdomen: Soft nontender  Neuro: Cranial nerves II through XII are intact, neurovascularly intact in all extremities with 2+ DTRs and 2+ pulses.  Lymph: No lymphadenopathy of posterior or anterior cervical chain or axillae bilaterally.  Gait normal with good balance and coordination.    MSK:  Non tender with full range of motion and good stability and symmetric strength and tone of shoulders, elbows, wrist, hip, knee and ankles bilaterally.  Foot exam shows: Shows the patient is tender to palpation over the plantar aspect of the right heel. Patient also has significant pes planus bilaterally with overpronation of the hindfoot. Return of the transverse arch bilaterally right greater than left as well. Negative pain over the navicular bone. Full range of motion of the ankle with full strength. Mild neuropathy of both feet at the toes.  Limited musculoskeletal ultrasound was performed and interpreted by Hulan Saas, M  Limited ultrasound shows the patient does have a cortical defect on the calcaneus on the plantar aspect. Hypoechoic changes. No callus formation noted. Impression: Non-healing calcaneal stress fracture.   Impression and Recommendations:     This case required medical decision making of moderate complexity.

## 2015-08-15 NOTE — Patient Instructions (Signed)
Good to see you.  Stress fracture of the right heel Wear boot daily for next 2-3 weeks.  Ice 20 minutes at night Avoid being barefoot Vitamin D 50000 IU weekly See me again in 3 weeks.

## 2015-08-15 NOTE — Progress Notes (Signed)
Pre visit review using our clinic review tool, if applicable. No additional management support is needed unless otherwise documented below in the visit note. 

## 2015-08-16 ENCOUNTER — Telehealth: Payer: Self-pay | Admitting: Internal Medicine

## 2015-08-16 ENCOUNTER — Telehealth: Payer: Self-pay

## 2015-08-16 NOTE — Telephone Encounter (Signed)
Pt called in said that the boot that she received yesterday is hurting her foot and would like to talk to someone about it   Best number [phone]

## 2015-08-16 NOTE — Telephone Encounter (Signed)
Spoke with patient about switching into a walking shoe instead of a boot. She said that she can't come to the office in the next few days to look at a shoe, will call back early next week if still having pain and wants to look at post-op shoe.

## 2015-08-21 ENCOUNTER — Ambulatory Visit: Payer: BLUE CROSS/BLUE SHIELD | Admitting: Neurology

## 2015-09-03 ENCOUNTER — Ambulatory Visit (INDEPENDENT_AMBULATORY_CARE_PROVIDER_SITE_OTHER): Payer: BLUE CROSS/BLUE SHIELD | Admitting: Family Medicine

## 2015-09-03 ENCOUNTER — Encounter: Payer: Self-pay | Admitting: Family Medicine

## 2015-09-03 ENCOUNTER — Other Ambulatory Visit (INDEPENDENT_AMBULATORY_CARE_PROVIDER_SITE_OTHER): Payer: BLUE CROSS/BLUE SHIELD

## 2015-09-03 VITALS — BP 118/82 | HR 69 | Ht 71.0 in | Wt 223.0 lb

## 2015-09-03 DIAGNOSIS — M79671 Pain in right foot: Secondary | ICD-10-CM

## 2015-09-03 DIAGNOSIS — S92001A Unspecified fracture of right calcaneus, initial encounter for closed fracture: Secondary | ICD-10-CM

## 2015-09-03 NOTE — Assessment & Plan Note (Signed)
Patient overall can start to transition back to a shoe. Discussed that this likely take another 6 weeks to pain-free. Patient will start also vitamin D supplementation daily in addition to the once weekly. I do not think the patient is absorbing well. We discussed continuing the icing. If any worsening pain to call back to the postop boot. Patient will follow-up with me again in 6 weeks for further evaluation. Spent  25 minutes with patient face-to-face and had greater than 50% of counseling including as described above in assessment and plan.

## 2015-09-03 NOTE — Progress Notes (Signed)
[name] [address] [place], [place] [address] Phone: [phone] Subjective:    I'm seeing this patient by the request  of:  Hoyt Koch, MD   CC: right foot pain follow-up   [id number] Mary [name] is a 54 y.o. female coming in with complaint of right foot pain. Patient was found to have a stress reaction. Patient was put in a postop boot. Patient has noticed approximately a 70% improvement. States that she has not had any significant new symptoms. Denies any numbness and denies any swelling. Patient is taking the high-dose vitamin D supplementation.  Past Medical History  Diagnosis Date  . Blood transfusion   . History of migraine headaches   . iron deficiency anemia   . Multiple hemangiomas     Liver  . Renal cysts, acquired, bilateral   . Pneumothorax 07/19/11    Bilateral  . Arm DVT (deep venous thromboembolism), acute ([facility]) 07/04/11    r/t PAC  . Neuropathy due to drug ([facility]) 2012    Early--assoc. w/prolonged cold sensitivity--Oxaliplatin related  . GERD (gastroesophageal reflux disease)   . Incisional hernia   . Adenocarcinoma sigmoid colon 05/12/11    Stage III(pT2pN2b) descending sigmoid   Past Surgical History  Procedure Laterality Date  . Abdominal hysterectomy    . Colon surgery  05/17/11    Partial colectomy  . Portacath placement  07/20/11    Right side placed  . Portacath placement      left side & right side  . Port-a-cath removal  05/06/2012    Procedure: MINOR REMOVAL PORT-A-CATH;  Surgeon: Gwenyth Ober, MD;  Location: [facility];  Service: General;  Laterality: Right;  . Ventral hernia repair  09/06/2012    Procedure: LAPAROSCOPIC VENTRAL HERNIA;  Surgeon: Gwenyth Ober, MD;  Location: [facility];  Service: General;  Laterality: N/A;  . Insertion of mesh  09/06/2012    Procedure: INSERTION OF MESH;  Surgeon: Gwenyth Ober, MD;  Location: [facility];  Service: General;  Laterality: N/A;  . Hernia repair   09/06/12    lap incisional hernia   Social History  Substance Use Topics  . Smoking status: Never Smoker   . Smokeless tobacco: Never Used  . Alcohol Use: No   No Known Allergies Family History  Problem Relation Age of Onset  . Cancer Maternal Uncle     lung  . Cancer Maternal Uncle     colon  . Cancer Maternal Uncle     prostate  . Diabetes Mother   . Hypertension Mother   . Cancer Father     leukemia  . Cancer Brother     Deceased  . Other Brother     MVA  . Healthy Sister   . Healthy Daughter     x 3        Past medical history, social, surgical and family history all reviewed in electronic medical record.   Review of Systems: No headache, visual changes, nausea, vomiting, diarrhea, constipation, dizziness, abdominal pain, skin rash, fevers, chills, night sweats, weight loss, swollen lymph nodes, body aches, joint swelling, muscle aches, chest pain, shortness of breath, mood changes.   Objective Blood pressure 118/82, pulse 69, height 5\' 11"  (1.803 m), weight 223 lb (101.152 kg), SpO2 99 %.  General: No apparent distress alert and oriented x3 mood and affect normal, dressed appropriately.  HEENT: Pupils equal, extraocular movements intact  Respiratory: Patient's speak in full sentences  and does not appear short of breath  Cardiovascular: No lower extremity edema, non tender, no erythema  Skin: Warm dry intact with no signs of infection or rash on extremities or on axial skeleton.  Abdomen: Soft nontender  Neuro: Cranial nerves II through XII are intact, neurovascularly intact in all extremities with 2+ DTRs and 2+ pulses.  Lymph: No lymphadenopathy of posterior or anterior cervical chain or axillae bilaterally.  Gait normal with good balance and coordination.  MSK:  Non tender with full range of motion and good stability and symmetric strength and tone of shoulders, elbows, wrist, hip, knee and ankles bilaterally.  Foot exam shows: Right heel non-tender today  which is an improvement. Patient also has significant pes planus bilaterally with overpronation of the hindfoot. Breakdown of the transverse arch bilaterally right greater than left as well. Negative pain over the navicular bone. Full range of motion of the ankle with full strength. Mild neuropathy of both feet at the toes.  Limited musculoskeletal ultrasound was performed and interpreted by Hulan Saas, M  Limited ultrasound shows the patient does have a cortical defect on the calcaneus on the plantar aspect Minimal callus formation noted,  Impression: Minimal healing calcaneal stress fracture   Impression and Recommendations:     This case required medical decision making of moderate complexity.

## 2015-09-03 NOTE — Patient Instructions (Addendum)
Good to see you Ice is your friend still  Wear shoes with a lot of paddig and rigid sole. Consider adding a dr shoals gellin pad to your shoe.  continue the vitamin D. Add 2000 IU daily in addition to the once weekly.  New balance greater than 700 would be great See me again in 6 weeks or clal me sooner if ywe need the other medicine.

## 2015-09-03 NOTE — Progress Notes (Signed)
Pre visit review using our clinic review tool, if applicable. No additional management support is needed unless otherwise documented below in the visit note. 

## 2015-09-04 ENCOUNTER — Ambulatory Visit (INDEPENDENT_AMBULATORY_CARE_PROVIDER_SITE_OTHER): Payer: BLUE CROSS/BLUE SHIELD | Admitting: Neurology

## 2015-09-04 DIAGNOSIS — G620 Drug-induced polyneuropathy: Secondary | ICD-10-CM | POA: Diagnosis not present

## 2015-09-04 DIAGNOSIS — T451X5A Adverse effect of antineoplastic and immunosuppressive drugs, initial encounter: Secondary | ICD-10-CM | POA: Diagnosis not present

## 2015-09-04 DIAGNOSIS — M79671 Pain in right foot: Secondary | ICD-10-CM

## 2015-09-04 DIAGNOSIS — M722 Plantar fascial fibromatosis: Secondary | ICD-10-CM

## 2015-09-04 NOTE — Procedures (Signed)
[facility]  [place], [place]  [place], [place] [address] Tel: [phone] Fax:  [phone] Test Date:  09/04/2015  Patient: Mary [name] DOB: 01-Jan-1961 Physician: Narda Amber, DO  Sex: Female Height: 5\' 11"  Ref Phys: Charlann Boxer, M.D.  ID#: [id number]   Technician: Jerilynn Mages. Dean   Patient Complaints: This is a 54 year old female with history of prior chemotherapy presenting for evaluation of bilateral feet paresthesias and right heel pain.  NCV & EMG Findings: Extensive electrodiagnostic testing of the right lower extremity and additional studies of the left shows: 1. Bilateral sural and superficial peroneal sensory responses are absent. 2. Bilateral peroneal and tibial motor responses are within normal limits. 3. Bilateral tibial H reflex studies are within normal limits. 4. Sparse chronic motor axon loss changes are isolated to the anterior tibialis and medial gastrocnemius muscles bilaterally, without accompanied active denervation.  Impression: The electrophysiologic findings are most consistent with a distal and symmetric sensorimotor polyneuropathy affecting the lower extremities.   There is no evidence of a superimposed lumbosacral radiculopathy.   ___________________________ Narda Amber, DO    Nerve Conduction Studies Anti Sensory Summary Table   Site NR Peak (ms) Norm Peak (ms) P-T Amp (V) Norm P-T Amp  Left Sup Peroneal Anti Sensory (Ant Lat Mall)  34.3C  12 cm NR  <4.6  >4  Right Sup Peroneal Anti Sensory (Ant Lat Mall)  34.3C  12 cm NR  <4.6  >4  Left Sural Anti Sensory (Lat Mall)  34.3C  Calf NR  <4.6  >4  Right Sural Anti Sensory (Lat Mall)  34.3C  Calf NR  <4.6  >4   Motor Summary Table   Site NR Onset (ms) Norm Onset (ms) O-P Amp (mV) Norm O-P Amp Site1 Site2 Delta-0 (ms) Dist (cm) Vel (m/s) Norm Vel (m/s)  Left Peroneal Motor (Ext Dig Brev)  34.3C  Ankle    4.5 <6.0 3.1 >2.5 B Fib Ankle 8.5 36.0 42 >40  B Fib    13.0  2.6   Poplt B Fib 2.0 10.0 50 >40  Poplt    15.0  2.6         Right Peroneal Motor (Ext Dig Brev)  34.3C  Ankle    4.8 <6.0 4.6 >2.5 B Fib Ankle 8.3 36.5 44 >40  B Fib    13.1  4.5  Poplt B Fib 2.3 10.0 43 >40  Poplt    15.4  4.6         Left Tibial Motor (Abd Hall Brev)  34.3C  Ankle    5.2 <6.0 7.0 >4 Knee Ankle 8.6 42.0 49 >40  Knee    13.8  5.7         Right Tibial Motor (Abd Hall Brev)  34.3C  Ankle    4.8 <6.0 4.8 >4 Knee Ankle 11.5 48.0 42 >40  Knee    16.3  3.3          H Reflex Studies   NR H-Lat (ms) Lat Norm (ms) L-R H-Lat (ms)  Left Tibial (Gastroc)  34.3C     34.97 <35 0.00  Right Tibial (Gastroc)  34.3C     34.97 <35 0.00   EMG   Side Muscle Ins Act Fibs Psw Fasc Number Recrt Dur Dur. Amp Amp. Poly Poly. Comment  Right AntTibialis Nml Nml Nml Nml 1- Mod-R Few 1+ Few 1+ Nml Nml N/A  Right Gastroc Nml Nml Nml Nml 1- Mod-R Few 1+ Few 1+ Nml Nml  N/A  Right RectFemoris Nml Nml Nml Nml Nml Nml Nml Nml Nml Nml Nml Nml N/A  Right GluteusMed Nml Nml Nml Nml Nml Nml Nml Nml Nml Nml Nml Nml N/A  Right BicepsFemS Nml Nml Nml Nml Nml Nml Nml Nml Nml Nml Nml Nml N/A  Left AntTibialis Nml Nml Nml Nml 1- Mod-R Few 1+ Few 1+ Nml Nml N/A  Left Gastroc Nml Nml Nml Nml 1- Mod-R Few 1+ Few 1+ Nml Nml N/A  Left RectFemoris Nml Nml Nml Nml Nml Nml Nml Nml Nml Nml Nml Nml N/A      Waveforms:

## 2015-10-16 ENCOUNTER — Ambulatory Visit: Payer: BLUE CROSS/BLUE SHIELD | Admitting: Family Medicine

## 2015-10-18 ENCOUNTER — Ambulatory Visit (INDEPENDENT_AMBULATORY_CARE_PROVIDER_SITE_OTHER): Payer: BLUE CROSS/BLUE SHIELD | Admitting: Family Medicine

## 2015-10-18 ENCOUNTER — Encounter: Payer: Self-pay | Admitting: Family Medicine

## 2015-10-18 VITALS — BP 134/86 | HR 86 | Ht 71.0 in | Wt 236.0 lb

## 2015-10-18 DIAGNOSIS — S92001A Unspecified fracture of right calcaneus, initial encounter for closed fracture: Secondary | ICD-10-CM | POA: Diagnosis not present

## 2015-10-18 MED ORDER — MELOXICAM 15 MG PO TABS: 15.0000 mg | ORAL_TABLET | Freq: Every day | ORAL | Status: DC

## 2015-10-18 NOTE — Progress Notes (Signed)
[name] [place], [place] [address] Phone: [phone] Subjective:    I'm seeing this patient by the request  of:  Hoyt Koch, MD   CC: right foot pain follow-up   Mary [name] is a 54 y.o. female coming in with complaint of right foot pain. Patient is been doing well with conservative therapy. Has transition back into regular shoes. Wearing tennis shoes most of the time. Having very minimal discomfort. Nothing that is stopping her from activities. If she walks barefoot she does notice some discomfort. Denies any nighttime awakenings, any swelling, continues with the vitamin D supplementation.  Past Medical History  Diagnosis Date  . Blood transfusion   . History of migraine headaches   . iron deficiency anemia   . Multiple hemangiomas     Liver  . Renal cysts, acquired, bilateral   . Pneumothorax 07/19/11    Bilateral  . Arm DVT (deep venous thromboembolism), acute ([place]) 07/04/11    r/t PAC  . Neuropathy due to drug ([place]) 2012    Early--assoc. w/prolonged cold sensitivity--Oxaliplatin related  . GERD (gastroesophageal reflux disease)   . Incisional hernia   . Adenocarcinoma sigmoid colon 05/12/11    Stage III(pT2pN2b) descending sigmoid   Past Surgical History  Procedure Laterality Date  . Abdominal hysterectomy    . Colon surgery  05/17/11    Partial colectomy  . Portacath placement  07/20/11    Right side placed  . Portacath placement      left side & right side  . Port-a-cath removal  05/06/2012    Procedure: MINOR REMOVAL PORT-A-CATH;  Surgeon: Gwenyth Ober, MD;  Location: [facility];  Service: General;  Laterality: Right;  . Ventral hernia repair  09/06/2012    Procedure: LAPAROSCOPIC VENTRAL HERNIA;  Surgeon: Gwenyth Ober, MD;  Location: [facility];  Service: General;  Laterality: N/A;  . Insertion of mesh  09/06/2012    Procedure: INSERTION OF MESH;  Surgeon: Gwenyth Ober, MD;   Location: [facility];  Service: General;  Laterality: N/A;  . Hernia repair  09/06/12    lap incisional hernia   Social History  Substance Use Topics  . Smoking status: Never Smoker   . Smokeless tobacco: Never Used  . Alcohol Use: No   No Known Allergies Family History  Problem Relation Age of Onset  . Cancer Maternal Uncle     lung  . Cancer Maternal Uncle     colon  . Cancer Maternal Uncle     prostate  . Diabetes Mother   . Hypertension Mother   . Cancer Father     leukemia  . Cancer Brother     Deceased  . Other Brother     MVA  . Healthy Sister   . Healthy Daughter     x 3        Past medical history, social, surgical and family history all reviewed in electronic medical record.   Review of Systems: No headache, visual changes, nausea, vomiting, diarrhea, constipation, dizziness, abdominal pain, skin rash, fevers, chills, night sweats, weight loss, swollen lymph nodes, body aches, joint swelling, muscle aches, chest pain, shortness of breath, mood changes.   Objective Blood pressure 134/86, pulse 86, height 5\' 11"  (1.803 m), weight 236 lb (107.049 kg), SpO2 96 %.  General: No apparent distress alert and oriented x3 mood and affect normal, dressed appropriately.  HEENT: Pupils equal, extraocular movements intact  Respiratory: Patient's speak in full sentences and does not appear short of breath  Cardiovascular: No lower extremity edema, non tender, no erythema  Skin: Warm dry intact with no signs of infection or rash on extremities or on axial skeleton.  Abdomen: Soft nontender  Neuro: Cranial nerves II through XII are intact, neurovascularly intact in all extremities with 2+ DTRs and 2+ pulses.  Lymph: No lymphadenopathy of posterior or anterior cervical chain or axillae bilaterally.  Gait normal with good balance and coordination.  MSK:  Non tender with full range of motion and good stability and symmetric strength and tone of shoulders, elbows, wrist, hip, knee  and ankles bilaterally.  Foot exam shows: Right heel non-tender today which is an improvement. Patient also has significant pes planus bilaterally with overpronation of the hindfoot. Breakdown of the transverse arch bilaterally right greater than left as well. Negative pain over the navicular bone. Full range of motion of the ankle with full strength. Patient's neuropathy is improved     Impression and Recommendations:     This case required medical decision making of moderate complexity.

## 2015-10-18 NOTE — Progress Notes (Signed)
Pre visit review using our clinic review tool, if applicable. No additional management support is needed unless otherwise documented below in the visit note. 

## 2015-10-18 NOTE — Assessment & Plan Note (Signed)
Seems to be healing very well. Secondary to patient's colon cancer this likely slow down patient's healing. Patient is doing very well. We discussed the importance of the vitamin D supplementation with patient having poor absorption secondary to the chemotherapy. Patient should be good on her last follow-up at this time. Patient will continue vitamin D supplementation and see me again on an as-needed basis. If any worsening symptoms we can consider injection but I am hoping that this will not be necessary.

## 2015-10-18 NOTE — Patient Instructions (Signed)
Great to see you Continue the good shoes.  Ice is your friend Meloxicam daily as needed and sent in prescription OK to increase activity If worsening symptoms come back and see me and we can try an injection.  Happy holidays!

## 2015-10-19 ENCOUNTER — Telehealth: Payer: Self-pay | Admitting: Family Medicine

## 2015-10-19 NOTE — Telephone Encounter (Signed)
Needs to know what kind of insole Dr. Tamala Julian wants her to get.

## 2015-10-22 NOTE — Telephone Encounter (Signed)
Heel Donut. Can get almost any [facility] or online.

## 2015-10-22 NOTE — Telephone Encounter (Signed)
Discussed with pt

## 2015-11-22 ENCOUNTER — Ambulatory Visit (INDEPENDENT_AMBULATORY_CARE_PROVIDER_SITE_OTHER)
Admission: RE | Admit: 2015-11-22 | Discharge: 2015-11-22 | Disposition: A | Discharge status: Home / Self Care | Payer: BLUE CROSS/BLUE SHIELD | Source: Ambulatory Visit | Attending: Family Medicine | Admitting: Family Medicine

## 2015-11-22 ENCOUNTER — Ambulatory Visit (INDEPENDENT_AMBULATORY_CARE_PROVIDER_SITE_OTHER): Payer: BLUE CROSS/BLUE SHIELD | Admitting: Family Medicine

## 2015-11-22 ENCOUNTER — Ambulatory Visit (HOSPITAL_BASED_OUTPATIENT_CLINIC_OR_DEPARTMENT_OTHER): Payer: BLUE CROSS/BLUE SHIELD | Admitting: Nurse Practitioner

## 2015-11-22 ENCOUNTER — Other Ambulatory Visit (HOSPITAL_BASED_OUTPATIENT_CLINIC_OR_DEPARTMENT_OTHER): Payer: BLUE CROSS/BLUE SHIELD

## 2015-11-22 ENCOUNTER — Encounter: Payer: Self-pay | Admitting: Family Medicine

## 2015-11-22 ENCOUNTER — Telehealth: Payer: Self-pay | Admitting: Oncology

## 2015-11-22 ENCOUNTER — Other Ambulatory Visit (INDEPENDENT_AMBULATORY_CARE_PROVIDER_SITE_OTHER): Payer: BLUE CROSS/BLUE SHIELD

## 2015-11-22 ENCOUNTER — Encounter: Payer: Self-pay | Admitting: Nurse Practitioner

## 2015-11-22 VITALS — BP 138/83 | HR 88 | Temp 98.3°F | Resp 18 | Ht 71.0 in | Wt 240.4 lb

## 2015-11-22 VITALS — BP 134/88 | HR 78 | Ht 71.0 in | Wt 243.0 lb

## 2015-11-22 DIAGNOSIS — M722 Plantar fascial fibromatosis: Secondary | ICD-10-CM | POA: Insufficient documentation

## 2015-11-22 DIAGNOSIS — C186 Malignant neoplasm of descending colon: Secondary | ICD-10-CM

## 2015-11-22 DIAGNOSIS — Z85038 Personal history of other malignant neoplasm of large intestine: Secondary | ICD-10-CM

## 2015-11-22 DIAGNOSIS — C189 Malignant neoplasm of colon, unspecified: Secondary | ICD-10-CM

## 2015-11-22 DIAGNOSIS — M79671 Pain in right foot: Secondary | ICD-10-CM

## 2015-11-22 NOTE — Telephone Encounter (Signed)
Gave patient avs report and appointments for July.  °
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
## 2015-11-22 NOTE — Patient Instructions (Signed)
Good to see you Ice is still your friend and will be needed in 6 hours Lets continue the vitamins Lets get an xray downstairs Avoid being barefoot See me again in 3 weeks to make sure we are doing better Happy New Year!
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
## 2015-11-22 NOTE — Progress Notes (Signed)
Pre visit review using our clinic review tool, if applicable. No additional management support is needed unless otherwise documented below in the visit note. 

## 2015-11-22 NOTE — Assessment & Plan Note (Signed)
Patient given injection today and tolerated the procedure well. We discussed icing regimen and home exercises. We discussed which activities to do an which was potentially avoid. Patient is to remain active. Patient will do the exercises. We will get x-ray to rule out any other bony other modalities that can be contributing with patient having long-standing issues with these feet. Patient will continue with the once weekly vitamin D supplementation. Patient will come back and see me again in 3 weeks to make sure that she is responding appropriate.

## 2015-11-23 LAB — CEA (PARALLEL TESTING): CEA: 1 ng/mL (ref 0.0–5.0)

## 2015-11-23 LAB — CEA: CEA: 1.9 ng/mL (ref 0.0–4.7)

## 2015-11-26 ENCOUNTER — Telehealth: Payer: Self-pay | Admitting: *Deleted

## 2015-11-26 NOTE — Telephone Encounter (Signed)
-----   Message from Ladell Pier, MD sent at 11/24/2015  9:41 AM EST ----- Please call patient, cea is normal

## 2015-11-26 NOTE — Telephone Encounter (Signed)
Per Dr. Sherrill; notified pt that cea is normal.  Pt verbalized understanding. 

## 2015-11-27 ENCOUNTER — Ambulatory Visit: Payer: BLUE CROSS/BLUE SHIELD | Admitting: Family Medicine

## 2015-11-30 ENCOUNTER — Telehealth: Payer: Self-pay | Admitting: *Deleted

## 2015-11-30 MED ORDER — LIDOCAINE 5 % EX PTCH: 1.0000 | MEDICATED_PATCH | CUTANEOUS | Status: DC

## 2015-11-30 NOTE — Telephone Encounter (Signed)
Sent in

## 2015-11-30 NOTE — Telephone Encounter (Signed)
Pt left msg on triage requesting refill on the lidoderm patch for her foot pain...Mary [name]

## 2015-11-30 NOTE — Telephone Encounter (Signed)
Notified pt rx has been sent.../lmb 

## 2016-01-14 ENCOUNTER — Encounter: Payer: Self-pay | Admitting: Internal Medicine

## 2016-01-14 ENCOUNTER — Other Ambulatory Visit (INDEPENDENT_AMBULATORY_CARE_PROVIDER_SITE_OTHER): Payer: BLUE CROSS/BLUE SHIELD

## 2016-01-14 ENCOUNTER — Ambulatory Visit (INDEPENDENT_AMBULATORY_CARE_PROVIDER_SITE_OTHER): Payer: BLUE CROSS/BLUE SHIELD | Admitting: Internal Medicine

## 2016-01-14 ENCOUNTER — Ambulatory Visit: Payer: BLUE CROSS/BLUE SHIELD | Admitting: Internal Medicine

## 2016-01-14 VITALS — BP 120/80 | HR 67 | Temp 98.3°F | Ht 71.0 in | Wt 242.1 lb

## 2016-01-14 DIAGNOSIS — Z0000 Encounter for general adult medical examination without abnormal findings: Secondary | ICD-10-CM

## 2016-01-14 DIAGNOSIS — H918X2 Other specified hearing loss, left ear: Secondary | ICD-10-CM

## 2016-01-14 DIAGNOSIS — Z23 Encounter for immunization: Secondary | ICD-10-CM | POA: Diagnosis not present

## 2016-01-14 DIAGNOSIS — R7301 Impaired fasting glucose: Secondary | ICD-10-CM | POA: Insufficient documentation

## 2016-01-14 DIAGNOSIS — E669 Obesity, unspecified: Secondary | ICD-10-CM | POA: Diagnosis not present

## 2016-01-14 DIAGNOSIS — H6120 Impacted cerumen, unspecified ear: Secondary | ICD-10-CM | POA: Insufficient documentation

## 2016-01-14 DIAGNOSIS — H6122 Impacted cerumen, left ear: Secondary | ICD-10-CM

## 2016-01-14 LAB — COMPREHENSIVE METABOLIC PANEL
ALT: 17 U/L (ref 0–35)
AST: 14 U/L (ref 0–37)
Albumin: 4.1 g/dL (ref 3.5–5.2)
Alkaline Phosphatase: 83 U/L (ref 39–117)
BUN: 14 mg/dL (ref 6–23)
CO2: 30 mEq/L (ref 19–32)
Calcium: 9.5 mg/dL (ref 8.4–10.5)
Chloride: 105 mEq/L (ref 96–112)
Creatinine, Ser: 1.12 mg/dL (ref 0.40–1.20)
GFR: 65.07 mL/min (ref >60.00)
Glucose, Bld: 106 mg/dL — ABNORMAL HIGH (ref 70–99)
Potassium: 4 mEq/L (ref 3.5–5.1)
Sodium: 140 mEq/L (ref 135–145)
Total Bilirubin: 0.5 mg/dL (ref 0.2–1.2)
Total Protein: 7.3 g/dL (ref 6.0–8.3)

## 2016-01-14 LAB — LIPID PANEL
Cholesterol: 191 mg/dL (ref 0–200)
HDL: 52.3 mg/dL (ref >39.00)
LDL Cholesterol: 123 mg/dL — ABNORMAL HIGH (ref 0–99)
NonHDL: 138.57
Total CHOL/HDL Ratio: 4
Triglycerides: 80 mg/dL (ref 0.0–149.0)
VLDL: 16 mg/dL (ref 0.0–40.0)

## 2016-01-14 LAB — HEMOGLOBIN A1C: Hgb A1c MFr Bld: 6.7 % — ABNORMAL HIGH (ref 4.6–6.5)

## 2016-01-14 LAB — HEPATITIS C ANTIBODY: HCV Ab: NEGATIVE

## 2016-01-14 NOTE — Assessment & Plan Note (Signed)
She did not get her labs done after last visit so checking again today. HgA1c and lipid panel.

## 2016-01-14 NOTE — Progress Notes (Signed)
   Subjective:    Patient ID: Mary [name], female    DOB: [birth year], 55 y.o.   MRN: [id number]  HPI The patient is a 55 YO female coming in for ears blocked. She started having some blockage and decreased hearing 1-2 months ago. She has needed them cleared in the past several times. Also having some mild allergies now but the hearing changed before the allergies started. She has not been exercising lately and had a pulled muscle (went to urgent care and given flexeril). Still seeing sports medicine about her foot pain.   Review of Systems  Constitutional: Negative for fever, activity change, appetite change, fatigue and unexpected weight change.  HENT: Positive for congestion, hearing loss and postnasal drip. Negative for dental problem, drooling, ear discharge, ear pain, rhinorrhea, sinus pressure, sore throat, trouble swallowing and voice change.   Eyes: Negative.   Respiratory: Negative for cough, chest tightness, shortness of breath and wheezing.   Cardiovascular: Negative for chest pain, palpitations and leg swelling.  Gastrointestinal: Negative for abdominal pain, diarrhea, constipation, blood in stool and abdominal distention.  Musculoskeletal: Positive for arthralgias. Negative for myalgias, back pain and gait problem.  Skin: Negative.   Neurological: Negative for dizziness, weakness, light-headedness and headaches.  Psychiatric/Behavioral: Negative.       Objective:   Physical Exam  Constitutional: She is oriented to person, place, and time. She appears well-developed and well-nourished. No distress.  HENT:  Head: Normocephalic and atraumatic.  Right ear TM normal no wax, left ear with some wax obstructing the canal. Oropharynx with mild erythema and clear drainage.   Eyes: EOM are normal.  Neck: Normal range of motion.  Cardiovascular: Normal rate and regular rhythm.   Pulmonary/Chest: Effort normal and breath sounds normal. No respiratory distress. She has no wheezes. She  has no rales.  Abdominal: Soft. Bowel sounds are normal. She exhibits no distension. There is no tenderness. There is no rebound.  Musculoskeletal: She exhibits no edema.  Neurological: She is alert and oriented to person, place, and time. Coordination normal.  Skin: Skin is warm and dry.   Filed Vitals:   01/14/16 0800  BP: 120/80  Pulse: 67  Temp: 98.3 F (36.8 C)  TempSrc: Oral  Height: 5\' 11"  (1.803 m)  Weight: 242 lb 2 oz (109.827 kg)  SpO2: 97%      Assessment & Plan:  Flu shot given at visit.

## 2016-01-14 NOTE — Patient Instructions (Signed)
We have cleaned out your ear and will check the blood work today. If this does not help the hearing it might be worthwhile to get a hearing test.  We have given you the flu shot today.  Work on getting back to exercise now that the weather is getting better.   Exercising to Stay Healthy Exercising regularly is important. It has many health benefits, such as:  Improving your overall fitness, flexibility, and endurance.  Increasing your bone density.  Helping with weight control.  Decreasing your body fat.  Increasing your muscle strength.  Reducing stress and tension.  Improving your overall health. In order to become healthy and stay healthy, it is recommended that you do moderate-intensity and vigorous-intensity exercise. You can tell that you are exercising at a moderate intensity if you have a higher heart rate and faster breathing, but you are still able to hold a conversation. You can tell that you are exercising at a vigorous intensity if you are breathing much harder and faster and cannot hold a conversation while exercising. HOW OFTEN SHOULD I EXERCISE? Choose an activity that you enjoy and set realistic goals. Your health care provider can help you to make an activity plan that works for you. Exercise regularly as directed by your health care provider. This may include:   Doing resistance training twice each week, such as:  Push-ups.  Sit-ups.  Lifting weights.  Using resistance bands.  Doing a given intensity of exercise for a given amount of time. Choose from these options:  150 minutes of moderate-intensity exercise every week.  75 minutes of vigorous-intensity exercise every week.  A mix of moderate-intensity and vigorous-intensity exercise every week. Children, pregnant women, people who are out of shape, people who are overweight, and older adults may need to consult a health care provider for individual recommendations. If you have any sort of medical  condition, be sure to consult your health care provider before starting a new exercise program.  WHAT ARE SOME EXERCISE IDEAS? Some moderate-intensity exercise ideas include:   Walking at a rate of 1 mile in 15 minutes.  Biking.  Hiking.  Golfing.  Dancing. Some vigorous-intensity exercise ideas include:   Walking at a rate of at least 4.5 miles per hour.  Jogging or running at a rate of 5 miles per hour.  Biking at a rate of at least 10 miles per hour.  Lap swimming.  Roller-skating or in-line skating.  Cross-country skiing.  Vigorous competitive sports, such as football, basketball, and soccer.  Jumping rope.  Aerobic dancing. WHAT ARE SOME EVERYDAY ACTIVITIES THAT CAN HELP ME TO GET EXERCISE?  Yard work, such as:  Psychologist, educational.  Raking and bagging leaves.  Washing and waxing your car.  Pushing a stroller.  Shoveling snow.  Gardening.  Washing windows or floors. HOW CAN I BE MORE ACTIVE IN MY DAY-TO-DAY ACTIVITIES?  Use the stairs instead of the elevator.  Take a walk during your lunch break.  If you drive, park your car farther away from work or school.  If you take public transportation, get off one stop early and walk the rest of the way.  Make all of your phone calls while standing up and walking around.  Get up, stretch, and walk around every 30 minutes throughout the day. WHAT GUIDELINES SHOULD I FOLLOW WHILE EXERCISING?  Do not exercise so much that you hurt yourself, feel dizzy, or get very short of breath.  Consult your health care provider before  starting a new exercise program.  Wear comfortable clothes and shoes with good support.  Drink plenty of water while you exercise to prevent dehydration or heat stroke. Body water is lost during exercise and must be replaced.  Work out until you breathe faster and your heart beats faster.   This information is not intended to replace advice given to you by your health care  provider. Make sure you discuss any questions you have with your health care provider.   Document Released: 11/29/2010 Document Revised: 11/17/2014 Document Reviewed: 03/30/2014 Elsevier Interactive Patient Education [employer].

## 2016-01-14 NOTE — Progress Notes (Signed)
Pre visit review using our clinic review tool, if applicable. No additional management support is needed unless otherwise documented below in the visit note. 

## 2016-01-14 NOTE — Addendum Note (Signed)
Addended by: Karle Barr on: 01/14/2016 08:56 AM   Modules accepted: Orders

## 2016-01-14 NOTE — Assessment & Plan Note (Signed)
Weight is up about 20 pounds since last visit and she is not exercising at all right now.

## 2016-01-14 NOTE — Assessment & Plan Note (Signed)
Left ear irrigated and informed that if hearing does not improve she may have loss and needs hearing test.

## 2016-02-18 ENCOUNTER — Other Ambulatory Visit: Payer: Self-pay

## 2016-02-18 DIAGNOSIS — Z1231 Encounter for screening mammogram for malignant neoplasm of breast: Secondary | ICD-10-CM

## 2016-03-24 ENCOUNTER — Ambulatory Visit: Payer: BLUE CROSS/BLUE SHIELD

## 2016-03-27 ENCOUNTER — Encounter (HOSPITAL_COMMUNITY): Payer: Self-pay | Admitting: Emergency Medicine

## 2016-03-27 ENCOUNTER — Ambulatory Visit (HOSPITAL_COMMUNITY)
Admission: EM | Admit: 2016-03-27 | Discharge: 2016-03-27 | Disposition: A | Discharge status: Home / Self Care | Payer: BLUE CROSS/BLUE SHIELD | Attending: Emergency Medicine | Admitting: Emergency Medicine

## 2016-03-27 ENCOUNTER — Ambulatory Visit (INDEPENDENT_AMBULATORY_CARE_PROVIDER_SITE_OTHER): Payer: BLUE CROSS/BLUE SHIELD

## 2016-03-27 DIAGNOSIS — L03115 Cellulitis of right lower limb: Secondary | ICD-10-CM | POA: Diagnosis not present

## 2016-03-27 MED ORDER — CEPHALEXIN 500 MG PO CAPS: 500.0000 mg | ORAL_CAPSULE | Freq: Four times a day (QID) | ORAL | Status: DC

## 2016-03-27 MED ORDER — HYDROXYZINE HCL 25 MG PO TABS: 25.0000 mg | ORAL_TABLET | Freq: Four times a day (QID) | ORAL | Status: DC | PRN

## 2016-03-27 NOTE — ED Notes (Signed)
Patient questioned medication for itching.  Spoke to dr honig.  Dr Bridgett Larsson sending script to pharmacy for the itching

## 2016-03-27 NOTE — ED Notes (Signed)
Right lower leg swelling, pain, and redness.  Issues started yesterday with this leg.  Initially thought there was possibly a bug bite due to the itching

## 2016-03-27 NOTE — ED Provider Notes (Addendum)
CSN: [id number]     Arrival date & time 03/27/16  1635 History   First MD Initiated Contact with Patient 03/27/16 1709     Chief Complaint  Patient presents with  . Cellulitis   (Consider location/radiation/quality/duration/timing/severity/associated sxs/prior Treatment) HPI  She is a 55 year old woman here for evaluation of right leg cellulitis. She states she developed redness, swelling, and itching to the right medial ankle yesterday. It has gotten worse today. No fevers. No nausea or vomiting. No known bug bites. It is painful to walk. She states her ankle feels tight when she moves it.  Past Medical History  Diagnosis Date  . Blood transfusion   . History of migraine headaches   . iron deficiency anemia   . Multiple hemangiomas     Liver  . Renal cysts, acquired, bilateral   . Pneumothorax 07/19/11    Bilateral  . Arm DVT (deep venous thromboembolism), acute ([place]) 07/04/11    r/t PAC  . 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 w/prolonged cold sensitivity--Oxaliplatin related  . GERD (gastroesophageal reflux disease)   . Incisional hernia   . Adenocarcinoma sigmoid colon 05/12/11    Stage III(pT2pN2b) descending sigmoid  . Cancer of descending colon ([place]) 05/12/2011   Past Surgical History  Procedure Laterality Date  . Abdominal hysterectomy    . Colon surgery  05/17/11    Partial colectomy  . Portacath placement  07/20/11    Right side placed  . Portacath placement      left side & right side  . Port-a-cath removal  05/06/2012    Procedure: MINOR REMOVAL PORT-A-CATH;  Surgeon: Gwenyth Ober, MD;  Location: [facility];  Service: General;  Laterality: Right;  . Ventral hernia repair  09/06/2012    Procedure: LAPAROSCOPIC VENTRAL HERNIA;  Surgeon: Gwenyth Ober, MD;  Location: [facility];  Service: General;  Laterality: N/A;  . Insertion of mesh  09/06/2012    Procedure: INSERTION OF MESH;  Surgeon: Gwenyth Ober, MD;  Location: [facility];  Service: General;   Laterality: N/A;  . Hernia repair  09/06/12    lap incisional hernia   Family History  Problem Relation Age of Onset  . Cancer Maternal Uncle     lung  . Cancer Maternal Uncle     colon  . Cancer Maternal Uncle     prostate  . Diabetes Mother   . Hypertension Mother   . Cancer Father     leukemia  . Cancer Brother     Deceased  . Other Brother     MVA  . Healthy Sister   . Healthy Daughter     x 3   Social History  Substance Use Topics  . Smoking status: Never Smoker   . Smokeless tobacco: Never Used  . Alcohol Use: No   OB History    No data available     Review of Systems As in history of present illness Allergies  Review of patient's allergies indicates no known allergies.  Home Medications   Prior to Admission medications   Medication Sig Start Date End Date Taking? Authorizing Provider  cephALEXin (KEFLEX) 500 MG capsule Take 1 capsule (500 mg total) by mouth 4 (four) times daily. 03/27/16   Melony Overly, MD  cyclobenzaprine (FLEXERIL) 10 MG tablet  11/10/15   Historical Provider, MD  docusate sodium (COLACE) 100 MG capsule Take 100 mg by mouth 2 (two) times daily as needed for mild constipation.  Historical Provider, MD  hydroxypropyl methylcellulose (ISOPTO TEARS) 2.5 % ophthalmic solution Place 1 drop into both eyes daily as needed for dry eyes.    Historical Provider, MD  ipratropium (ATROVENT) 0.06 % nasal spray Place 2 sprays into both nostrils 4 (four) times daily. 11/15/14   Gregor Hams, MD  lidocaine (LIDODERM) 5 % Place 1 patch onto the skin daily. Remove & Discard patch within 12 hours or as directed by MD 11/30/15   Hoyt Koch, MD  polyethylene glycol [facility] Floria Raveling) packet Take 17 g by mouth daily. Patient taking differently: Take 17 g by mouth daily as needed.  02/27/14   Verlee Monte, MD  topiramate (TOPAMAX) 100 MG tablet Take 200 mg by mouth at bedtime as needed. 03/20/15   Historical Provider, MD  Vitamin D, Ergocalciferol,  (DRISDOL) 50000 UNITS CAPS capsule Take 1 capsule (50,000 Units total) by mouth every 7 (seven) days. 08/15/15   Lyndal Pulley, DO   Meds Ordered and Administered this Visit  Medications - No data to display  BP 137/96 mmHg  Pulse 86  Temp(Src) 98.7 F (37.1 C) (Oral)  Resp 16  SpO2 99% No data found.   Physical Exam  Constitutional: She is oriented to person, place, and time. She appears well-developed and well-nourished. No distress.  Cardiovascular: Normal rate.   Pulmonary/Chest: Effort normal.  Musculoskeletal:       Feet:  Right ankle: She does have good range of motion. I do not appreciate any joint effusion. She does have medial soft tissue swelling with erythema.  Neurological: She is alert and oriented to person, place, and time.  Skin:  Erythema and swelling to right medial ankle.    ED Course  Procedures (including critical care time)  Labs Review Labs Reviewed - No data to display  Imaging Review Dg Ankle Complete Right  03/27/2016  ADDENDUM REPORT: 03/27/2016 17:58 ADDENDUM: After further review with the ordering physician, there is a sclerotic focus along the inner margin of the distal right fibula which is suspected to be chronic and most likely a benign bone island. Patient does, however, have a history of colon cancer so a right ankle MRI is recommended to ensure benignity. MRI would also be helpful to exclude the possibility of a radiographically occult osteomyelitis given the recent history of cellulitis and ankle pain. These findings and recommendations were discussed with Dr. Maryruth Eve on 03/27/2016 at 5:56 pm. Electronically Signed   By: Franki Cabot M.D.   On: 03/27/2016 17:58  03/27/2016  CLINICAL DATA:  Cellulitis of the right ankle, right ankle pain. Decreased range of motion. EXAM: RIGHT ANKLE - COMPLETE 3+ VIEW COMPARISON:  None. FINDINGS: Osseous alignment is normal. Bone mineralization is normal. No fracture line or displaced fracture fragment  seen. No erosions, demineralization or other signs of developing osteomyelitis. At least mild soft tissue swelling/ edema overlying the distal tibia and along the medial aspect of the right ankle. No soft tissue air identified. Incidental note made of an enthesophyte along the plantar margin of the posterior calcaneus. IMPRESSION: 1. Soft tissue swelling/edema. 2. No osseous abnormality seen. Electronically Signed: By: Franki Cabot M.D. On: 03/27/2016 17:42     MDM   1. Cellulitis of right ankle    We'll treat cellulitis with Keflex. Return precautions reviewed. Hydroxyzine as needed for itching.  I had an extensive discussion with the patient regarding her x-ray results.  Discussed that the lesion on x-ray is most likely benign, but with  her history of colon cancer an MRI would be beneficial to confirm a benign lesion. It also definitively rule out osteomyelitis with her cellulitis and ankle pain. There is no emergent need for this to be done today.  I have asked her to call her primary care doctor tomorrow to see about setting up an outpatient MRI.    Melony Overly, MD 03/27/16 [id number]  Melony Overly, MD 03/27/16 [id number]

## 2016-03-27 NOTE — Discharge Instructions (Signed)
You have an infection of the skin of the right ankle. Take Keflex 4 times a day for 1 week. Keep your leg elevated as much as you can for the next day or 2. You should see improvement in 2 days. If the redness is spreading, you develop fevers, or this is not getting better, please come back.  When we did x-ray, there is a small lesion on the inside of the fibula.  This is most likely a benign lesion, however with your history of colon cancer, it would be a good idea to get an MRI to make sure.  Please call your primary care doctor tomorrow to set up an appointment to discuss MRI.

## 2016-04-03 ENCOUNTER — Encounter: Payer: Self-pay | Admitting: Internal Medicine

## 2016-04-03 ENCOUNTER — Ambulatory Visit (INDEPENDENT_AMBULATORY_CARE_PROVIDER_SITE_OTHER): Payer: BLUE CROSS/BLUE SHIELD | Admitting: Internal Medicine

## 2016-04-03 VITALS — BP 140/100 | HR 71 | Temp 98.1°F | Resp 12 | Ht 71.0 in | Wt 245.0 lb

## 2016-04-03 DIAGNOSIS — R938 Abnormal findings on diagnostic imaging of other specified body structures: Secondary | ICD-10-CM

## 2016-04-03 DIAGNOSIS — C189 Malignant neoplasm of colon, unspecified: Secondary | ICD-10-CM

## 2016-04-03 DIAGNOSIS — L03115 Cellulitis of right lower limb: Secondary | ICD-10-CM | POA: Diagnosis not present

## 2016-04-03 DIAGNOSIS — R9389 Abnormal findings on diagnostic imaging of other specified body structures: Secondary | ICD-10-CM

## 2016-04-03 MED ORDER — SULFAMETHOXAZOLE-TRIMETHOPRIM 800-160 MG PO TABS: 1.0000 | ORAL_TABLET | Freq: Two times a day (BID) | ORAL | Status: DC

## 2016-04-03 MED ORDER — TOPIRAMATE 100 MG PO TABS: 200.0000 mg | ORAL_TABLET | Freq: Every evening | ORAL | Status: DC | PRN

## 2016-04-03 NOTE — Patient Instructions (Signed)
We have sent in the topamax and bactrim. You can start taking the bactrim twice a day with food.   We have ordered the MRI of the ankle/leg to follow up on the x-ray. It is very unlikely that this is anything problematic.

## 2016-04-03 NOTE — Progress Notes (Signed)
Pre visit review using our clinic review tool, if applicable. No additional management support is needed unless otherwise documented below in the visit note. 

## 2016-04-04 ENCOUNTER — Telehealth: Payer: Self-pay | Admitting: Geriatric Medicine

## 2016-04-04 DIAGNOSIS — R9389 Abnormal findings on diagnostic imaging of other specified body structures: Secondary | ICD-10-CM | POA: Insufficient documentation

## 2016-04-04 DIAGNOSIS — L0390 Cellulitis, unspecified: Secondary | ICD-10-CM | POA: Insufficient documentation

## 2016-04-04 NOTE — Telephone Encounter (Signed)
Done

## 2016-04-04 NOTE — Telephone Encounter (Signed)
Spoke to [facility]. The MRI orders need to be changed to with and without contrast. Please advise, thanks.

## 2016-04-04 NOTE — Telephone Encounter (Signed)
Cathy from [facility] called. The order for the MRI needs to be changed. I left a message for Tye [place] to call me back so I could find out what order needs to be changed to.

## 2016-04-04 NOTE — Progress Notes (Signed)
   Subjective:    Patient ID: Mary [name], female    DOB: 02/04/1961, 55 y.o.   MRN: [id number]  HPI The patient is a 55 YO female coming in for abnormal imaging of her foot at urgent care. They recommended MRI of that. She is also still taking the antibiotic that they have given her for some cellulitis. It is healing okay but moving slightly up from where it was. No fevers or chills. The pain is decreasing overall. She is working and this causes the leg to swell some. No weight change.   Review of Systems  Constitutional: Negative for fever, activity change, appetite change, fatigue and unexpected weight change.  Respiratory: Negative for cough, chest tightness, shortness of breath and wheezing.   Cardiovascular: Negative for chest pain, palpitations and leg swelling.  Gastrointestinal: Negative for abdominal pain, diarrhea, constipation, blood in stool and abdominal distention.  Musculoskeletal: Positive for arthralgias. Negative for myalgias, back pain and gait problem.  Skin: Positive for rash.  Neurological: Negative for dizziness, weakness, light-headedness and headaches.      Objective:   Physical Exam  Constitutional: She is oriented to person, place, and time. She appears well-developed and well-nourished. No distress.  HENT:  Head: Normocephalic and atraumatic.  Eyes: EOM are normal.  Neck: Normal range of motion.  Cardiovascular: Normal rate and regular rhythm.   Pulmonary/Chest: Effort normal and breath sounds normal. No respiratory distress. She has no wheezes. She has no rales.  Abdominal: Soft. Bowel sounds are normal. She exhibits no distension. There is no tenderness. There is no rebound.  Musculoskeletal: She exhibits no edema.  Mild rash on the right leg around the mid shin. Some mild swelling in the ankle but not pitting.   Neurological: She is alert and oriented to person, place, and time. Coordination normal.  Skin: Skin is warm and dry.   Filed Vitals:   04/03/16 1602  BP: 140/100  Pulse: 71  Temp: 98.1 F (36.7 C)  TempSrc: Oral  Resp: 12  Height: 5\' 11"  (1.803 m)  Weight: 245 lb (111.131 kg)  SpO2: 99%      Assessment & Plan:

## 2016-04-04 NOTE — Assessment & Plan Note (Signed)
Considering that her pill bottle is still about half full (and should be gone based on sig and when it was prescribed) will switch to bactrim which she will be able to take as instructed instead of ceflex (she was not able to do QID). Appears to be healing.

## 2016-04-04 NOTE — Assessment & Plan Note (Signed)
Manually reviewed the x-ray in the room with the patient and her 2 daughters. Clinically I feel it is highly unlikely to be significant but MRI ordered.

## 2016-04-04 NOTE — Addendum Note (Signed)
Addended by: Pricilla Holm A on: 04/04/2016 12:00 PM   Modules accepted: Orders

## 2016-04-14 ENCOUNTER — Ambulatory Visit
Admission: RE | Admit: 2016-04-14 | Discharge: 2016-04-14 | Disposition: A | Discharge status: Home / Self Care | Payer: BLUE CROSS/BLUE SHIELD | Source: Ambulatory Visit | Attending: Internal Medicine | Admitting: Internal Medicine

## 2016-04-14 DIAGNOSIS — R9389 Abnormal findings on diagnostic imaging of other specified body structures: Secondary | ICD-10-CM

## 2016-04-14 MED ORDER — GADOBENATE DIMEGLUMINE 529 MG/ML IV SOLN
20.0000 mL | Freq: Once | INTRAVENOUS | Status: AC | PRN
  Administered 2016-04-14: 20 mL via INTRAVENOUS

## 2016-05-11 ENCOUNTER — Telehealth: Payer: Self-pay | Admitting: Oncology

## 2016-05-11 NOTE — Telephone Encounter (Signed)
PAL - moved 7/13 appointments to 8/1. Confirmed with patient.

## 2016-05-15 ENCOUNTER — Telehealth: Payer: Self-pay | Admitting: Oncology

## 2016-05-15 NOTE — Telephone Encounter (Signed)
pt cld to state her schedule @ work would permit her from the [id number] req to move to 8/21-gave pt appt time & date

## 2016-05-22 ENCOUNTER — Other Ambulatory Visit: Payer: BLUE CROSS/BLUE SHIELD

## 2016-05-22 ENCOUNTER — Ambulatory Visit
Admission: RE | Admit: 2016-05-22 | Discharge: 2016-05-22 | Disposition: A | Discharge status: Home / Self Care | Payer: BLUE CROSS/BLUE SHIELD | Source: Ambulatory Visit

## 2016-05-22 ENCOUNTER — Ambulatory Visit: Payer: BLUE CROSS/BLUE SHIELD | Admitting: Oncology

## 2016-05-22 DIAGNOSIS — Z1231 Encounter for screening mammogram for malignant neoplasm of breast: Secondary | ICD-10-CM

## 2016-05-25 ENCOUNTER — Emergency Department (HOSPITAL_BASED_OUTPATIENT_CLINIC_OR_DEPARTMENT_OTHER)
Admit: 2016-05-25 | Discharge: 2016-05-25 | Disposition: A | Discharge status: Home / Self Care | Payer: BLUE CROSS/BLUE SHIELD

## 2016-05-25 ENCOUNTER — Emergency Department (HOSPITAL_COMMUNITY)
Admission: EM | Admit: 2016-05-25 | Discharge: 2016-05-25 | Disposition: A | Discharge status: Home / Self Care | Payer: BLUE CROSS/BLUE SHIELD | Attending: Physician Assistant | Admitting: Physician Assistant

## 2016-05-25 ENCOUNTER — Emergency Department (HOSPITAL_COMMUNITY): Payer: BLUE CROSS/BLUE SHIELD

## 2016-05-25 ENCOUNTER — Encounter (HOSPITAL_COMMUNITY): Payer: Self-pay

## 2016-05-25 DIAGNOSIS — M7989 Other specified soft tissue disorders: Secondary | ICD-10-CM

## 2016-05-25 DIAGNOSIS — Z79899 Other long term (current) drug therapy: Secondary | ICD-10-CM | POA: Diagnosis not present

## 2016-05-25 DIAGNOSIS — Z853 Personal history of malignant neoplasm of breast: Secondary | ICD-10-CM | POA: Diagnosis not present

## 2016-05-25 DIAGNOSIS — R0602 Shortness of breath: Secondary | ICD-10-CM | POA: Insufficient documentation

## 2016-05-25 DIAGNOSIS — R05 Cough: Secondary | ICD-10-CM | POA: Diagnosis not present

## 2016-05-25 DIAGNOSIS — R0981 Nasal congestion: Secondary | ICD-10-CM | POA: Diagnosis present

## 2016-05-25 DIAGNOSIS — Z86718 Personal history of other venous thrombosis and embolism: Secondary | ICD-10-CM | POA: Insufficient documentation

## 2016-05-25 DIAGNOSIS — J029 Acute pharyngitis, unspecified: Secondary | ICD-10-CM | POA: Diagnosis not present

## 2016-05-25 DIAGNOSIS — R059 Cough, unspecified: Secondary | ICD-10-CM

## 2016-05-25 DIAGNOSIS — Z85038 Personal history of other malignant neoplasm of large intestine: Secondary | ICD-10-CM | POA: Diagnosis not present

## 2016-05-25 LAB — RAPID STREP SCREEN (MED CTR MEBANE ONLY): Streptococcus, Group A Screen (Direct): NEGATIVE

## 2016-05-25 MED ORDER — GUAIFENESIN 100 MG/5ML PO SOLN
5.0000 mL | Freq: Once | ORAL | Status: AC
  Administered 2016-05-25: 100 mg via ORAL
  Filled 2016-05-25: qty 5

## 2016-05-25 MED ORDER — GUAIFENESIN-CODEINE 100-10 MG/5ML PO SOLN: 5.0000 mL | Freq: Four times a day (QID) | ORAL | Status: DC | PRN

## 2016-05-25 MED ORDER — BENZONATATE 100 MG PO CAPS: 100.0000 mg | ORAL_CAPSULE | Freq: Three times a day (TID) | ORAL | Status: DC | PRN

## 2016-05-25 MED ORDER — IBUPROFEN 800 MG PO TABS: 800.0000 mg | ORAL_TABLET | Freq: Three times a day (TID) | ORAL | Status: DC

## 2016-05-25 MED ORDER — IBUPROFEN 800 MG PO TABS
800.0000 mg | ORAL_TABLET | Freq: Once | ORAL | Status: AC
Start: 2016-05-25 — End: 2016-05-25
  Administered 2016-05-25: 800 mg via ORAL
  Filled 2016-05-25: qty 1

## 2016-05-25 NOTE — Progress Notes (Signed)
*  PRELIMINARY RESULTS* Vascular Ultrasound Right upper extremity venous duplex has been completed.  Preliminary findings: No evidence of DVT or superficial thrombosis.   Landry Mellow, RDMS, RVT  05/25/2016, 6:32 PM

## 2016-05-25 NOTE — ED Notes (Addendum)
Pt states right arm pain has been ongoing x 2 weeks. Pt states that today she head a "pop" that has exacerbated her pain. Pt has full ROM.  Pt states that she began with a sore throat and then developed a cough a few days ago that has been keeping her up at night.

## 2016-05-25 NOTE — ED Notes (Signed)
Pt ambulatory and independent at discharge.  Verbalized understanding of all discharge instructions.

## 2016-05-25 NOTE — ED Notes (Signed)
Ortho contacted for ace wrap placement

## 2016-05-25 NOTE — ED Notes (Signed)
Pt c/o sore throat, nasal congestion, and cough x 3-4 days and R elbow pain starting today.  Pain score 7/10.  Pt has not been taking anything for URI symptoms.  Pt reports arm pain began after "feeling a pop while reaching up for something in the closet."  Full ROM noted.  Swelling noted to medial R elbow.  Pt reports Hx of DVT, but unsure which arm.

## 2016-05-25 NOTE — ED Provider Notes (Signed)
CSN: [id number]     Arrival date & time 05/25/16  1721 History   First MD Initiated Contact with Patient 05/25/16 1739     Chief Complaint  Patient presents with  . URI  . Arm Pain     (Consider location/radiation/quality/duration/timing/severity/associated sxs/prior Treatment) HPI  Patient is a 55 year old female with past medical history something for DVT in her upper extremity during treatment of breast cancer. Patient presenting today with 2 issues. Her primary issue is that she's had a cough and congestion for the last 3-4 days. Accompanied by sore throat. She reports tactile fevers at home. No nausea vomiting or diarrhea.  Additionally patient notes some swelling to her right medial arm. She reports that she was reaching for something above her head and felt a pop and now has swelling there. She is concern for DVT. Patient has history of right upper extremity DVT.  Past Medical History  Diagnosis Date  . Blood transfusion   . History of migraine headaches   . iron deficiency anemia   . Multiple hemangiomas     Liver  . Renal cysts, acquired, bilateral   . Pneumothorax 07/19/11    Bilateral  . Arm DVT (deep venous thromboembolism), acute ([place]) 07/04/11    r/t PAC  . 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 w/prolonged cold sensitivity--Oxaliplatin related  . GERD (gastroesophageal reflux disease)   . Incisional hernia   . Adenocarcinoma sigmoid colon 05/12/11    Stage III(pT2pN2b) descending sigmoid  . Cancer of descending colon ([place]) 05/12/2011   Past Surgical History  Procedure Laterality Date  . Abdominal hysterectomy    . Colon surgery  05/17/11    Partial colectomy  . Portacath placement  07/20/11    Right side placed  . Portacath placement      left side & right side  . Port-a-cath removal  05/06/2012    Procedure: MINOR REMOVAL PORT-A-CATH;  Surgeon: Gwenyth Ober, MD;  Location: [place];  Service: General;  Laterality: Right;  . Ventral  hernia repair  09/06/2012    Procedure: LAPAROSCOPIC VENTRAL HERNIA;  Surgeon: Gwenyth Ober, MD;  Location: [place];  Service: General;  Laterality: N/A;  . Insertion of mesh  09/06/2012    Procedure: INSERTION OF MESH;  Surgeon: Gwenyth Ober, MD;  Location: [place];  Service: General;  Laterality: N/A;  . Hernia repair  09/06/12    lap incisional hernia   Family History  Problem Relation Age of Onset  . Cancer Maternal Uncle     lung  . Cancer Maternal Uncle     colon  . Cancer Maternal Uncle     prostate  . Diabetes Mother   . Hypertension Mother   . Cancer Father     leukemia  . Cancer Brother     Deceased  . Other Brother     MVA  . Healthy Sister   . Healthy Daughter     x 3   Social History  Substance Use Topics  . Smoking status: Never Smoker   . Smokeless tobacco: Never Used  . Alcohol Use: No   OB History    No data available     Review of Systems  Constitutional: Negative for activity change.  HENT: Positive for congestion and sore throat. Negative for ear pain.   Respiratory: Positive for cough and shortness of breath.   Cardiovascular: Negative for chest pain.  Gastrointestinal: Negative for abdominal pain.  Genitourinary:  Negative for dysuria.  Neurological: Negative for dizziness.  Psychiatric/Behavioral: Negative for agitation.  All other systems reviewed and are negative.     Allergies  Review of patient's allergies indicates no known allergies.  Home Medications   Prior to Admission medications   Medication Sig Start Date End Date Taking? Authorizing Provider  benzonatate (TESSALON PERLES) 100 MG capsule Take 1 capsule (100 mg total) by mouth 3 (three) times daily as needed for cough. 05/25/16   Uri Turnbough Lyn Lenzie Montesano, MD  cephALEXin (KEFLEX) 500 MG capsule Take 1 capsule (500 mg total) by mouth 4 (four) times daily. 03/27/16   Melony Overly, MD  cyclobenzaprine (FLEXERIL) 10 MG tablet Reported on 04/03/2016 11/10/15   Historical Provider, MD   docusate sodium (COLACE) 100 MG capsule Take 100 mg by mouth 2 (two) times daily as needed for mild constipation.    Historical Provider, MD  guaiFENesin-codeine 100-10 MG/5ML syrup Take 5 mLs by mouth every 6 (six) hours as needed for cough. 05/25/16   Pranshu Lyster Lyn Demetry Bendickson, MD  hydroxypropyl methylcellulose (ISOPTO TEARS) 2.5 % ophthalmic solution Place 1 drop into both eyes daily as needed for dry eyes.    Historical Provider, MD  hydrOXYzine (ATARAX/VISTARIL) 25 MG tablet Take 1 tablet (25 mg total) by mouth every 6 (six) hours as needed for itching. 03/27/16   Melony Overly, MD  ibuprofen (ADVIL,MOTRIN) 800 MG tablet Take 1 tablet (800 mg total) by mouth 3 (three) times daily. 05/25/16   Jule Schlabach Lyn Parvin Stetzer, MD  ipratropium (ATROVENT) 0.06 % nasal spray Place 2 sprays into both nostrils 4 (four) times daily. Patient not taking: Reported on 04/03/2016 11/15/14   Gregor Hams, MD  lidocaine (LIDODERM) 5 % Place 1 patch onto the skin daily. Remove & Discard patch within 12 hours or as directed by MD 11/30/15   Hoyt Koch, MD  polyethylene glycol [facility] / Floria Raveling) packet Take 17 g by mouth daily. Patient not taking: Reported on 04/03/2016 02/27/14   Verlee Monte, MD  sulfamethoxazole-trimethoprim (BACTRIM DS,SEPTRA DS) 800-160 MG tablet Take 1 tablet by mouth 2 (two) times daily. 04/03/16   Hoyt Koch, MD  topiramate (TOPAMAX) 100 MG tablet Take 2 tablets (200 mg total) by mouth at bedtime as needed. Reported on 04/03/2016 04/03/16   Hoyt Koch, MD  Vitamin D, Ergocalciferol, (DRISDOL) 50000 UNITS CAPS capsule Take 1 capsule (50,000 Units total) by mouth every 7 (seven) days. 08/15/15   Lyndal Pulley, DO   BP 135/91 mmHg  Pulse 75  Temp(Src) 99.4 F (37.4 C) (Oral)  Resp 18  Ht 5\' 11"  (1.803 m)  Wt 230 lb (104.327 kg)  BMI 32.09 kg/m2  SpO2 100% Physical Exam  Constitutional: She is oriented to person, place, and time. She appears well-developed and well-nourished.   HENT:  Head: Normocephalic and atraumatic.  Erythema to bilateral nares.  Eyes: Conjunctivae are normal. Right eye exhibits no discharge.  Neck: Neck supple.  Cardiovascular: Normal rate, regular rhythm and normal heart sounds.   No murmur heard. Pulmonary/Chest: Effort normal and breath sounds normal. She has no wheezes. She has no rales.  Abdominal: Soft. She exhibits no distension. There is no tenderness.  Musculoskeletal: Normal range of motion. She exhibits no edema.  Patient reports swelling to medial arm. Tenderness over medial elbow.  Neurological: She is oriented to person, place, and time. No cranial nerve deficit.  Skin: Skin is warm and dry. No rash noted. She is not diaphoretic.  Psychiatric: She has  a normal mood and affect. Her behavior is normal.  Nursing note and vitals reviewed.   ED Course  Procedures (including critical care time) Labs Review Labs Reviewed  RAPID STREP SCREEN (NOT AT [facility])    Imaging Review Dg Chest 2 View  05/25/2016  CLINICAL DATA:  Productive cough for 2 days with chest pain. EXAM: CHEST  2 VIEW COMPARISON:  02/04/2012 chest radiograph FINDINGS: The cardiomediastinal silhouette is unremarkable. There is no evidence of focal airspace disease, pulmonary edema, suspicious pulmonary nodule/mass, pleural effusion, or pneumothorax. No acute bony abnormalities are identified. IMPRESSION: No active cardiopulmonary disease. Electronically Signed   By: Margarette Canada M.D.   On: 05/25/2016 18:46   I have personally reviewed and evaluated these images and lab results as part of my medical decision-making.   EKG Interpretation None      MDM   Final diagnoses:  Cough    Patient is a 55 year old well-appearing female presenting with cold-like symptoms. Patient had a couple days of stuffy nose or runny nose cough congestion. Llungs are clear. Patient also complains of upper extremity issue. Doubt DVT given the presentation of a pop and worsening with  movement. However given her history we'll do an ultrasound.   We'll do chest x-ray and give symptomatic treatment.  UE [place] negative. CXR shows no pna. Will give cough syrup, follow up prn PCP.     Rolfe Hartsell Julio Alm, MD 05/25/16 2041

## 2016-05-25 NOTE — Discharge Instructions (Signed)

## 2016-05-28 LAB — CULTURE, GROUP A STREP (THRC)

## 2016-06-10 ENCOUNTER — Ambulatory Visit: Payer: BLUE CROSS/BLUE SHIELD | Admitting: Oncology

## 2016-06-10 ENCOUNTER — Other Ambulatory Visit: Payer: BLUE CROSS/BLUE SHIELD

## 2016-06-30 ENCOUNTER — Ambulatory Visit (HOSPITAL_BASED_OUTPATIENT_CLINIC_OR_DEPARTMENT_OTHER): Payer: BLUE CROSS/BLUE SHIELD | Admitting: Oncology

## 2016-06-30 ENCOUNTER — Other Ambulatory Visit (HOSPITAL_BASED_OUTPATIENT_CLINIC_OR_DEPARTMENT_OTHER): Payer: BLUE CROSS/BLUE SHIELD

## 2016-06-30 ENCOUNTER — Other Ambulatory Visit: Payer: Self-pay | Admitting: *Deleted

## 2016-06-30 ENCOUNTER — Telehealth: Payer: Self-pay | Admitting: Oncology

## 2016-06-30 VITALS — BP 136/76 | HR 75 | Temp 98.3°F | Resp 18 | Ht 71.0 in | Wt 239.3 lb

## 2016-06-30 DIAGNOSIS — Z85038 Personal history of other malignant neoplasm of large intestine: Secondary | ICD-10-CM

## 2016-06-30 DIAGNOSIS — C189 Malignant neoplasm of colon, unspecified: Secondary | ICD-10-CM

## 2016-06-30 DIAGNOSIS — C186 Malignant neoplasm of descending colon: Secondary | ICD-10-CM

## 2016-06-30 LAB — CEA (IN HOUSE-CHCC): CEA (CHCC-In House): 1.05 ng/mL (ref 0.00–5.00)

## 2016-06-30 MED ORDER — TOPIRAMATE 100 MG PO TABS: 200.0000 mg | ORAL_TABLET | Freq: Every evening | ORAL | 1 refills | Status: DC | PRN

## 2016-06-30 NOTE — Telephone Encounter (Signed)
Gave paient avs report and appointments for August 2018.
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
## 2016-07-01 LAB — CEA: CEA: 1.8 ng/mL (ref 0.0–4.7)

## 2016-07-02 ENCOUNTER — Telehealth: Payer: Self-pay | Admitting: *Deleted

## 2016-07-02 NOTE — Telephone Encounter (Signed)
Left message on voicemail informing pt of normal lab. 

## 2016-07-02 NOTE — Telephone Encounter (Signed)
-----   Message from Ladell Pier, MD sent at 07/01/2016  5:15 PM EDT ----- Please call patient , cea is normal, f/u as scheduled

## 2016-07-22 ENCOUNTER — Encounter: Payer: BLUE CROSS/BLUE SHIELD | Admitting: Internal Medicine

## 2016-08-05 ENCOUNTER — Telehealth: Payer: Self-pay

## 2016-08-05 NOTE — Telephone Encounter (Signed)
Patient called and wants a Cortizone shot in foot this week. She needs something after lunch on day. Please advise if you would like me to double book. Thank you.

## 2016-08-07 NOTE — Telephone Encounter (Signed)
Called back to inform her to make and app at Dr.Smiths first available. She will call back on her break to make app.

## 2016-08-07 NOTE — Telephone Encounter (Signed)
  I would see her again but would need to be first available.

## 2016-08-18 ENCOUNTER — Encounter: Payer: BLUE CROSS/BLUE SHIELD | Admitting: Internal Medicine

## 2016-09-12 ENCOUNTER — Ambulatory Visit: Payer: BLUE CROSS/BLUE SHIELD | Admitting: Family Medicine

## 2016-10-06 ENCOUNTER — Encounter: Payer: BLUE CROSS/BLUE SHIELD | Admitting: Internal Medicine

## 2017-01-08 ENCOUNTER — Ambulatory Visit (INDEPENDENT_AMBULATORY_CARE_PROVIDER_SITE_OTHER): Payer: 59

## 2017-01-08 ENCOUNTER — Ambulatory Visit (INDEPENDENT_AMBULATORY_CARE_PROVIDER_SITE_OTHER): Payer: 59 | Admitting: Family Medicine

## 2017-01-08 ENCOUNTER — Ambulatory Visit: Payer: Self-pay

## 2017-01-08 VITALS — BP 132/80 | HR 70 | Temp 98.8°F | Resp 16 | Ht 71.0 in | Wt 235.0 lb

## 2017-01-08 DIAGNOSIS — M546 Pain in thoracic spine: Secondary | ICD-10-CM

## 2017-01-08 DIAGNOSIS — R319 Hematuria, unspecified: Secondary | ICD-10-CM | POA: Diagnosis not present

## 2017-01-08 DIAGNOSIS — M62838 Other muscle spasm: Secondary | ICD-10-CM

## 2017-01-08 LAB — POC MICROSCOPIC URINALYSIS (UMFC): Mucus: ABSENT

## 2017-01-08 LAB — GLUCOSE, POCT (MANUAL RESULT ENTRY): POC Glucose: 109 mg/dl — AB (ref 70–99)

## 2017-01-08 LAB — POCT URINALYSIS DIP (MANUAL ENTRY)
Bilirubin, UA: NEGATIVE
Glucose, UA: NEGATIVE
Ketones, POC UA: NEGATIVE
Nitrite, UA: NEGATIVE
Protein Ur, POC: NEGATIVE
Spec Grav, UA: >=1.03
Urobilinogen, UA: 0.2
pH, UA: 5

## 2017-01-08 MED ORDER — IBUPROFEN 600 MG PO TABS: 600.0000 mg | ORAL_TABLET | Freq: Three times a day (TID) | ORAL | 0 refills | Status: DC | PRN

## 2017-01-08 MED ORDER — CYCLOBENZAPRINE HCL 10 MG PO TABS: 10.0000 mg | ORAL_TABLET | Freq: Three times a day (TID) | ORAL | 0 refills | Status: DC | PRN

## 2017-01-08 MED ORDER — CEPHALEXIN 500 MG PO CAPS: 500.0000 mg | ORAL_CAPSULE | Freq: Two times a day (BID) | ORAL | 0 refills | Status: DC

## 2017-01-08 NOTE — Patient Instructions (Addendum)
  Your urine test MAY have shown a urinary tract infection.  We are sending this for culture.    Take the Keflex twice a day for 7 days.  This is an antibiotic.  You have a bad muscle spasm in your middle back.  This is causing the pain you are feeling.  Take the Ibuprofen 600 mg every 8 hours or so for pain relief and anti-inflammatory effects.  Take the Flexeril as a muscle relaxer at night. This may make you drowsy and if it does do not take it during the day.  Heat and massage are also great to help relieve the pain.  This can last for the next 7-10 days. If you're still having issues in the next 2 weeks come back and see [place].  If you start having worsening pain despite the treatment don't wait and come back immediately.    IF you received an x-ray today, you will receive an invoice from [facility]. Please contact [facility] at [phone] with questions or concerns regarding your invoice.   IF you received labwork today, you will receive an invoice from [place]. Please contact [facility] at [phone] with questions or concerns regarding your invoice.   Our billing staff will not be able to assist you with questions regarding bills from these companies.  You will be contacted with the lab results as soon as they are available. The fastest way to get your results is to activate your My Chart account. Instructions are located on the last page of this paperwork. If you have not heard from [place] regarding the results in 2 weeks, please contact this office.

## 2017-01-08 NOTE — Progress Notes (Signed)
Mary [name] is a 56 y.o. female who presents to Primary Care at [facility] today for back pain:  1.  Back pain:  Present for past 1.5 weeks but worse in past week.  Describes sharp stabbing pain in her right thoracic region. She is not someone who usually gets back pain. She did have some lidocaine patches at home from what sounds like plantar fasciitis from several years ago. It sounds like these are also expired. She tried these on her back but they did not help. She has been using heating pad at night which provides her some relief but then pain returns next morning. She is not taking anything over-the-counter as far as analgesics or anything prescription analgesics. She is concerned she may have a kidney infection or kidney stone.  She denies any dysuria. She does have polyuria. Also nocturia "every hour on the hour." This is new since she started a weight loss program about 4 weeks ago. She is drinking much water and walking daily. She did not go for walk today because of the rain but she did go for a walk yesterday. This did not aggravate her back pain. Pain is worse with prolonged sitting or with trying to bend over.  No fevers or chills. No injury to her back. No hematuria. No radiation to her lower back. No radiation to lower legs. No paresthesias, numbness, weakness bilateral lower extremities.  No constipation.  No abd pain.  No N/V.  No recent travel.  No LE edema.  No chest pain/palpitations/dyspnea.   PMH:  She has history of colon CA.  She has been in remission from this for 4 years.   ROS as above.    PMH reviewed. Patient is a nonsmoker.   Past Medical History:  Diagnosis Date  . Adenocarcinoma sigmoid colon 05/12/11   Stage III(pT2pN2b) descending sigmoid  . Arm DVT (deep venous thromboembolism), acute ([place]) 07/04/11   r/t PAC  . Blood transfusion   . Cancer of descending colon ([place]) 05/12/2011  . GERD (gastroesophageal reflux disease)   . History of migraine headaches   .  Incisional hernia   . iron deficiency anemia   . Multiple hemangiomas    Liver  . Neuropathy due to drug ([place]) 2012   Early--assoc. w/prolonged cold sensitivity--Oxaliplatin related  . Pneumothorax 07/19/11   Bilateral  . Renal cysts, acquired, bilateral    Past Surgical History:  Procedure Laterality Date  . ABDOMINAL HYSTERECTOMY    . COLON SURGERY  05/17/11   Partial colectomy  . HERNIA REPAIR  09/06/12   lap incisional hernia  . INSERTION OF MESH  09/06/2012   Procedure: INSERTION OF MESH;  Surgeon: Gwenyth Ober, MD;  Location: [place];  Service: General;  Laterality: N/A;  . PORT-A-CATH REMOVAL  05/06/2012   Procedure: MINOR REMOVAL PORT-A-CATH;  Surgeon: Gwenyth Ober, MD;  Location: [place];  Service: General;  Laterality: Right;  . PORTACATH PLACEMENT  07/20/11   Right side placed  . PORTACATH PLACEMENT     left side & right side  . VENTRAL HERNIA REPAIR  09/06/2012   Procedure: LAPAROSCOPIC VENTRAL HERNIA;  Surgeon: Gwenyth Ober, MD;  Location: [place];  Service: General;  Laterality: N/A;    Medications reviewed. Current Outpatient Prescriptions  Medication Sig Dispense Refill  . topiramate (TOPAMAX) 100 MG tablet Take 2 tablets (200 mg total) by mouth at bedtime as needed. Reported on 04/03/2016 90 tablet 1  . Vitamin D, Ergocalciferol, (  DRISDOL) 50000 UNITS CAPS capsule Take 1 capsule (50,000 Units total) by mouth every 7 (seven) days. 8 capsule 0  . hydroxypropyl methylcellulose (ISOPTO TEARS) 2.5 % ophthalmic solution Place 1 drop into both eyes daily as needed for dry eyes.    . polyethylene glycol (MIRALAX / GLYCOLAX) packet Take 17 g by mouth daily. (Patient not taking: Reported on 04/03/2016) 14 each 0   No current facility-administered medications for this visit.      Physical Exam:  BP 132/80   Pulse 70   Temp 98.8 F (37.1 C) (Oral)   Resp 16   Ht 5\' 11"  (1.803 m)   Wt 235 lb (106.6 kg)   SpO2 100%   BMI 32.78 kg/m  Gen:  Alert,  cooperative patient who appears stated age in no acute distress.  Vital signs reviewed. HEENT: EOMI,  MMM.  Some mild BL exopholthalmus Pulm:  Clear to auscultation bilaterally with good air movement.  No wheezes or rales noted.   Cardiac:  Regular rate and rhythm without murmur auscultated.  Good S1/S2. Back:  Normal skin, Spine with normal alignment and no deformity.  No tenderness to vertebral process palpation.  Paraspinous muscles are tender Right thoracic region and with spasm.   Range of motion is full at neck and decreased somewhat forward flexion lumbar sacral regions due to reproduction of pain in mid-back.  Straight leg raise is minimally positive on the Right and negative on the Left. Neuro:  Sensation and motor function 5/5 bilateral lower extremities.  Patellar and Achilles  DTR's +2 patellar BL. Skin:  No rash noted.    Results for orders placed or performed in visit on 01/08/17  POCT glucose (manual entry)  Result Value Ref Range   POC Glucose 109 (A) 70 - 99 mg/dl  POCT urinalysis dipstick  Result Value Ref Range   Color, UA yellow yellow   Clarity, UA clear clear   Glucose, UA negative negative   Bilirubin, UA negative negative   Ketones, POC UA negative negative   Spec Grav, UA >=1.030    Blood, UA trace-intact (A) negative   pH, UA 5.0    Protein Ur, POC negative negative   Urobilinogen, UA 0.2    Nitrite, UA Negative Negative   Leukocytes, UA Trace (A) Negative    Assessment and Plan:  1.  Back pain, likely thoracic muscle strain:  - radiographs obtained due to history of cancer.  These were negative for any bony lesions or involvement.   - u/a with trace blood and leukocytosis .  With urinary frequency.  - will also treat as UTI.  Return in 2 weeks for repeat U/A to ensure hematuria has resolved and back pain has improved.  - will also generally treat as muscle strain to back with NSAIDs and muscle relaxers  2.  Hematuria; - with trace leukocytes - see  above.  Tx'ing as UTI.  Return 2 weeks for repeat U/A to ensure hematuria has cleared.

## 2017-01-21 ENCOUNTER — Ambulatory Visit (INDEPENDENT_AMBULATORY_CARE_PROVIDER_SITE_OTHER): Payer: 59 | Admitting: Family Medicine

## 2017-01-21 VITALS — BP 140/92 | HR 81 | Temp 98.6°F | Resp 18 | Ht 71.0 in | Wt 238.0 lb

## 2017-01-21 DIAGNOSIS — M549 Dorsalgia, unspecified: Secondary | ICD-10-CM

## 2017-01-21 DIAGNOSIS — M5489 Other dorsalgia: Secondary | ICD-10-CM

## 2017-01-21 LAB — POCT URINALYSIS DIP (MANUAL ENTRY)
Bilirubin, UA: NEGATIVE
Blood, UA: NEGATIVE
Glucose, UA: NEGATIVE
Ketones, POC UA: NEGATIVE
Leukocytes, UA: NEGATIVE
Nitrite, UA: NEGATIVE
Protein Ur, POC: NEGATIVE
Spec Grav, UA: 1.02
Urobilinogen, UA: 0.2
pH, UA: 6

## 2017-01-21 LAB — POC MICROSCOPIC URINALYSIS (UMFC): Mucus: ABSENT

## 2017-01-21 MED ORDER — LIDOCAINE 5 % EX PTCH: 1.0000 | MEDICATED_PATCH | CUTANEOUS | 0 refills | Status: DC

## 2017-01-21 MED ORDER — TRAMADOL HCL 50 MG PO TABS: 50.0000 mg | ORAL_TABLET | Freq: Three times a day (TID) | ORAL | 0 refills | Status: DC | PRN

## 2017-01-21 NOTE — Progress Notes (Signed)
Subjective:    Mary [name] is a 56 y.o. female who presents to PCP today for FU U/A:  1.  Back pain and hematuria:  Patient seen here about 2 weeks ago with back pain, mid-thoracic.  Also had UTI symptoms  Had U/A positive for hematuria and diagnosed with UTI and mid-thoracic strain.   Since being seen, she has been taking muscle relaxers and prescription strength ibuprofen.  No real relief of her back pain.  No radiation to buttocks.  No LE weakness, paresthesias, numbness.  Sometimes radiates to level of lumbar back, but otherwise remains Left sided back pain.  States she "lives on a heating pad."  No neck pain or headaches.    No further UTI symptoms.  No gross hematuria.    ROS as above per HPI.  Pertinently, no chest pain, palpitations, SOB, Fever, Chills, Abd pain, N/V/D.    The following portions of the patient's history were reviewed and updated as appropriate: allergies, current medications, past medical history, family and social history, and problem list. Patient is a nonsmoker.    PMH reviewed.  Past Medical History:  Diagnosis Date  . Adenocarcinoma sigmoid colon 05/12/11   Stage III(pT2pN2b) descending sigmoid  . Arm DVT (deep venous thromboembolism), acute ([place]) 07/04/11   r/t PAC  . Blood transfusion   . Cancer of descending colon ([place]) 05/12/2011  . GERD (gastroesophageal reflux disease)   . History of migraine headaches   . Incisional hernia   . iron deficiency anemia   . Multiple hemangiomas    Liver  . 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 w/prolonged cold sensitivity--Oxaliplatin related  . Pneumothorax 07/19/11   Bilateral  . Renal cysts, acquired, bilateral    Past Surgical History:  Procedure Laterality Date  . ABDOMINAL HYSTERECTOMY    . COLON SURGERY  05/17/11   Partial colectomy  . HERNIA REPAIR  09/06/12   lap incisional hernia  . INSERTION OF MESH  09/06/2012   Procedure: INSERTION OF MESH;  Surgeon: Gwenyth Ober, MD;  Location: [facility];   Service: General;  Laterality: N/A;  . PORT-A-CATH REMOVAL  05/06/2012   Procedure: MINOR REMOVAL PORT-A-CATH;  Surgeon: Gwenyth Ober, MD;  Location: [facility];  Service: General;  Laterality: Right;  . PORTACATH PLACEMENT  07/20/11   Right side placed  . PORTACATH PLACEMENT     left side & right side  . VENTRAL HERNIA REPAIR  09/06/2012   Procedure: LAPAROSCOPIC VENTRAL HERNIA;  Surgeon: Gwenyth Ober, MD;  Location: [facility];  Service: General;  Laterality: N/A;    Medications reviewed. Current Outpatient Prescriptions  Medication Sig Dispense Refill  . cyclobenzaprine (FLEXERIL) 10 MG tablet Take 1 tablet (10 mg total) by mouth 3 (three) times daily as needed for muscle spasms. 30 tablet 0  . hydroxypropyl methylcellulose (ISOPTO TEARS) 2.5 % ophthalmic solution Place 1 drop into both eyes daily as needed for dry eyes.    Marland Kitchen ibuprofen (ADVIL,MOTRIN) 600 MG tablet Take 1 tablet (600 mg total) by mouth every 8 (eight) hours as needed. 30 tablet 0  . polyethylene glycol (MIRALAX / GLYCOLAX) packet Take 17 g by mouth daily. 14 each 0  . topiramate (TOPAMAX) 100 MG tablet Take 2 tablets (200 mg total) by mouth at bedtime as needed. Reported on 04/03/2016 90 tablet 1  . Vitamin D, Ergocalciferol, (DRISDOL) 50000 UNITS CAPS capsule Take 1 capsule (50,000 Units total) by mouth every 7 (seven) days. 8 capsule  0   No current facility-administered medications for this visit.      Objective:   Physical Exam BP (!) 140/92 (BP Location: Right Arm, Patient Position: Sitting, Cuff Size: Large)   Pulse 81   Temp 98.6 F (37 C) (Oral)   Resp 18   Ht 5\' 11"  (1.803 m)   Wt 238 lb (108 kg)   SpO2 98%   BMI 33.19 kg/m  Gen:  Alert, cooperative patient who appears stated age in no acute distress.  Vital signs reviewed. HEENT: EOMI,  MMM Cardiac:  Regular rate and rhythm  Pulm:  Clear to auscultation bilaterally with good air movement.  No wheezes or rales noted.   Abd:   Soft/nondistended/nontender.  Back:  Normal skin, Spine with normal alignment and no deformity.  No tenderness to vertebral process palpation.  Paraspinous muscles are tender Right sided mid thoracic region.  She has an over the counter, portable TENs unit in place.   Range of motion is full at neck and lumbar sacral regions.   Neuro:  Sensation and motor function 5/5 bilateral lower extremities.  .     No results found for this or any previous visit (from the past 72 hour(s)).  Imp/Plan: 1.  Back pain: - Persists.   - No injuries/no red flags.  Has been going on for a little over a month without much relief.  - Treating with Tramadol and Lidoderm.  Discussed about PT, which I believe would be helpful.  - Patient would like further referral to specialist to ensure nothing else is going on.  - Plan referral today to Orthopedist for further evaluation and recommendations.    2. Hematuria: - resolved.   - repeat UA completely negative.   - Do not think contributing to #1 above.

## 2017-01-21 NOTE — Patient Instructions (Addendum)
   It was good to see you again today.  Your urine test looked completely normal, this is great news.   Take the Tramadol 1 pill for pain relief.  You can take 2 at night to help with relief.    Use the Lidoderm patch every 12 hours as needed for pain relief.  We are referring you to an Orthopedist for your back pain.  Their office will contact you to set up an appointment  IF you received an x-ray today, you will receive an invoice from [facility]. Please contact [facility] at [phone] with questions or concerns regarding your invoice.   IF you received labwork today, you will receive an invoice from [facility]. Please contact [facility] at [phone] with questions or concerns regarding your invoice.   Our billing staff will not be able to assist you with questions regarding bills from these companies.  You will be contacted with the lab results as soon as they are available. The fastest way to get your results is to activate your My Chart account. Instructions are located on the last page of this paperwork. If you have not heard from [place] regarding the results in 2 weeks, please contact this office.

## 2017-01-26 ENCOUNTER — Telehealth: Payer: Self-pay | Admitting: Family Medicine

## 2017-01-26 DIAGNOSIS — M546 Pain in thoracic spine: Secondary | ICD-10-CM

## 2017-01-26 NOTE — Telephone Encounter (Signed)
Pt calling to inquire about referrals to PT and Ortho. Both are mentioned in last OV with Dr Mingo Amber but they have not yet been placed. Pt would like to continue with both as soon as possible. If possible, please call pt when they have been placed.

## 2017-01-28 NOTE — Telephone Encounter (Signed)
Please place refer if appropriate

## 2017-01-29 ENCOUNTER — Telehealth: Payer: Self-pay | Admitting: Physical Therapy

## 2017-01-29 NOTE — Telephone Encounter (Signed)
01/29/17 due to deductible patient does not want to schedule PT

## 2017-02-18 ENCOUNTER — Ambulatory Visit (INDEPENDENT_AMBULATORY_CARE_PROVIDER_SITE_OTHER): Payer: 59 | Admitting: Orthopedic Surgery

## 2017-02-18 ENCOUNTER — Encounter (INDEPENDENT_AMBULATORY_CARE_PROVIDER_SITE_OTHER): Payer: Self-pay | Admitting: Orthopedic Surgery

## 2017-02-18 ENCOUNTER — Ambulatory Visit (INDEPENDENT_AMBULATORY_CARE_PROVIDER_SITE_OTHER): Payer: 59

## 2017-02-18 DIAGNOSIS — M545 Low back pain: Secondary | ICD-10-CM | POA: Diagnosis not present

## 2017-02-18 NOTE — Progress Notes (Signed)
Office Visit Note   Patient: Mary [name]           Date of Birth: [birth year]           MRN: [id number] Visit Date: 02/18/2017 Requested by: Alveda Reasons, MD [address] [place], [place] [address] PCP: Hoyt Koch, MD  Subjective: Chief Complaint  Patient presents with  . Middle Back - Pain    HPI: [name] is a 56 year old patient with back pain of 2 months duration.  Radiates around to the right hip but not into the groin.  Symptoms are worse when standing and going from long time of sitting to standing.  She describes pressure that causes shortness of breath but she really localizes her symptoms to the lower central right-hand portion of her posterior chest wall around the level of the lower thoracic vertebral bodies.  2 views thoracic spine performed unremarkable.  She's been using some and possible pain patches and heating pad every night along with ibuprofen.  Tramadol has not helped.  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  She is okay with coughing and sneezing but reports a constant gnawing nagging type of pain.  She does have a history of colon cancer treated 3-4 years ago.  She reports pain essentially "all the time"              ROS: All systems reviewed are negative as they relate to the chief complaint within the history of present illness.  Patient denies  fevers or chills.   Assessment & Plan: Visit Diagnoses:  1. Acute right-sided low back pain, with sciatica presence unspecified     Plan: Impression is lower right-sided central thoracic rib cage type pain.  There is no significant radicular component present.  Location of her pain patches is really on the posterior lower thoracic rib cage.  This is concerning for metastatic disease based on her pain pattern.  Lumbar spine radiographs normal.  She needs MRI of her thoracic spine with and without contrast along with CT scan of her chest to evaluate this right-sided  posterior chest wall pain.  Been going on for 2 months occurring at night with no real exertional pattern.  I will see her back after those studies  Follow-Up Instructions: No Follow-up on file.   Orders:  Orders Placed This Encounter  Procedures  . XR Lumbar Spine 2-3 Views  . MR THORACIC SPINE W WO CONTRAST  . CT CHEST W CONTRAST   No orders of the defined types were placed in this encounter.     Procedures: No procedures performed   Clinical Data: No additional findings.  Objective: Vital Signs: There were no vitals taken for this visit.  Physical Exam:   Constitutional: Patient appears well-developed HEENT:  Head: Normocephalic Eyes:EOM are normal Neck: Normal range of motion Cardiovascular: Normal rate Pulmonary/chest: Effort normal Neurologic: Patient is alert Skin: Skin is warm Psychiatric: Patient has normal mood and affect    Ortho Exam: Orthopedic exam demonstrates normal gait alignment mild pain with forward bending and extension.  She has 5 out of 5 ankle dorsi flexion plantar flexion quite hamstring strength along with hip flexion strength.  No paresthesias L1 S1 bilaterally.  No groin pain with internal/external rotation of the leg.  No trochanteric tenderness is noted.  She does have a little bit of tenderness to palpation along the inferior chest wall on the right-hand side around T10-T12.  No skin lesions or rashes in this area.  Specialty Comments:  No specialty comments available.  Imaging: Xr Lumbar Spine 2-3 Views  Result Date: 02/18/2017 AP lateral lumbar spine reviewed.  Mild distal facet arthritis is present in the lower lumbar spine.  1-2 mm spondylolisthesis at L4-5.  No compression deformities or bony structural abnormalities noted in the lumbar spine.  Visualized hips normal.  Sacroiliac joints also normal.    PMFS History: Patient Active Problem List   Diagnosis Date Noted  . Cellulitis 04/04/2016  . Abnormal x-ray 04/04/2016  .  Hearing loss due to cerumen impaction 01/14/2016  . Impaired fasting blood sugar 01/14/2016  . Plantar fasciitis, right 11/22/2015  . Right calcaneal fracture 08/15/2015  . OSA (obstructive sleep apnea) 09/26/2014  . Peripheral neuropathy due to chemotherapy ([place]) 06/05/2014  . Obesity 06/05/2014  . history of DVT (deep venous thrombosis) 02/20/2014  . GERD (gastroesophageal reflux disease) 02/20/2014  . Hernia 09/22/2012  . Iron deficiency anemia, unspecified 08/30/2011  . Colon cancer ([place]) 08/29/2011  . Cancer of descending colon ([place]) 05/12/2011   Past Medical History:  Diagnosis Date  . Adenocarcinoma sigmoid colon 05/12/11   Stage III(pT2pN2b) descending sigmoid  . Arm DVT (deep venous thromboembolism), acute ([place]) 07/04/11   r/t PAC  . Blood transfusion   . Cancer of descending colon ([place]) 05/12/2011  . GERD (gastroesophageal reflux disease)   . History of migraine headaches   . Incisional hernia   . iron deficiency anemia   . Multiple hemangiomas    Liver  . 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 w/prolonged cold sensitivity--Oxaliplatin related  . Pneumothorax 07/19/11   Bilateral  . Renal cysts, acquired, bilateral     Family History  Problem Relation Age of Onset  . Diabetes Mother   . Hypertension Mother   . Cancer Father     leukemia  . Cancer Maternal Uncle     lung  . Cancer Maternal Uncle     colon  . Cancer Maternal Uncle     prostate  . Cancer Brother     Deceased  . Other Brother     MVA  . Healthy Sister   . Healthy Daughter     x 3    Past Surgical History:  Procedure Laterality Date  . ABDOMINAL HYSTERECTOMY    . COLON SURGERY  05/17/11   Partial colectomy  . HERNIA REPAIR  09/06/12   lap incisional hernia  . INSERTION OF MESH  09/06/2012   Procedure: INSERTION OF MESH;  Surgeon: Gwenyth Ober, MD;  Location: [facility];  Service: General;  Laterality: N/A;  . PORT-A-CATH REMOVAL  05/06/2012   Procedure: MINOR REMOVAL PORT-A-CATH;   Surgeon: Gwenyth Ober, MD;  Location: [facility];  Service: General;  Laterality: Right;  . PORTACATH PLACEMENT  07/20/11   Right side placed  . PORTACATH PLACEMENT     left side & right side  . VENTRAL HERNIA REPAIR  09/06/2012   Procedure: LAPAROSCOPIC VENTRAL HERNIA;  Surgeon: Gwenyth Ober, MD;  Location: [facility];  Service: General;  Laterality: N/A;   Social History   Occupational History  . 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 Smoking status: Never Smoker  . Smokeless tobacco: Never Used  . Alcohol use No  . Drug use: No  . Sexual activity: Yes

## 2017-02-19 ENCOUNTER — Other Ambulatory Visit (INDEPENDENT_AMBULATORY_CARE_PROVIDER_SITE_OTHER): Payer: Self-pay | Admitting: Orthopedic Surgery

## 2017-02-19 DIAGNOSIS — M546 Pain in thoracic spine: Secondary | ICD-10-CM

## 2017-02-25 ENCOUNTER — Other Ambulatory Visit (INDEPENDENT_AMBULATORY_CARE_PROVIDER_SITE_OTHER): Payer: Self-pay | Admitting: Orthopedic Surgery

## 2017-02-25 DIAGNOSIS — R0789 Other chest pain: Secondary | ICD-10-CM

## 2017-02-25 NOTE — Progress Notes (Signed)
[name] [place] [place], [place] [address] Phone: [phone] Subjective:    I'm seeing this patient by the request  of:  Hoyt Koch, MD   CC: right foot pain follow-up   [id number]  Mary [name] is a 56 y.o. female coming in with complaint of right foot pain. Patient is been doing well with conservative therapy. Hx of PF Injected a long time a go.  Worsening symptoms at this time. Asian states the ensuing pain in the heel again. Patient isn't affecting her daily activities. Makes it difficult for her to do regular activities. Patient denies any numbness or tingling. Only hurts with the first steps then seems to get somewhat better.   Past Medical History:  Diagnosis Date  . Adenocarcinoma sigmoid colon 05/12/11   Stage III(pT2pN2b) descending sigmoid  . Arm DVT (deep venous thromboembolism), acute ([place]) 07/04/11   r/t PAC  . Blood transfusion   . Cancer of descending colon ([place]) 05/12/2011  . GERD (gastroesophageal reflux disease)   . History of migraine headaches   . Incisional hernia   . iron deficiency anemia   . Multiple hemangiomas    Liver  . 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 w/prolonged cold sensitivity--Oxaliplatin related  . Pneumothorax 07/19/11   Bilateral  . Renal cysts, acquired, bilateral    Past Surgical History:  Procedure Laterality Date  . ABDOMINAL HYSTERECTOMY    . COLON SURGERY  05/17/11   Partial colectomy  . HERNIA REPAIR  09/06/12   lap incisional hernia  . INSERTION OF MESH  09/06/2012   Procedure: INSERTION OF MESH;  Surgeon: Gwenyth Ober, MD;  Location: [facility];  Service: General;  Laterality: N/A;  . PORT-A-CATH REMOVAL  05/06/2012   Procedure: MINOR REMOVAL PORT-A-CATH;  Surgeon: Gwenyth Ober, MD;  Location: [facility];  Service: General;  Laterality: Right;  . PORTACATH PLACEMENT  07/20/11   Right side placed  . PORTACATH PLACEMENT     left side & right side  .  VENTRAL HERNIA REPAIR  09/06/2012   Procedure: LAPAROSCOPIC VENTRAL HERNIA;  Surgeon: Gwenyth Ober, MD;  Location: [facility];  Service: General;  Laterality: N/A;   Social History  Substance Use Topics  . Smoking status: Never Smoker  . Smokeless tobacco: Never Used  . Alcohol use No   No Known Allergies Family History  Problem Relation Age of Onset  . Diabetes Mother   . Hypertension Mother   . Cancer Father     leukemia  . Cancer Maternal Uncle     lung  . Cancer Maternal Uncle     colon  . Cancer Maternal Uncle     prostate  . Cancer Brother     Deceased  . Other Brother     MVA  . Healthy Sister   . Healthy Daughter     x 3        Past medical history, social, surgical and family history all reviewed in electronic medical record.   Review of Systems: No headache, visual changes, nausea, vomiting, diarrhea, constipation, dizziness, abdominal pain, skin rash, fevers, chills, night sweats, weight loss, swollen lymph nodes, body aches, joint swelling, muscle aches, chest pain, shortness of breath, mood changes.    Objective  Blood pressure 122/82, pulse 71, resp. rate 16, weight 234 lb 2 oz (106.2 kg), SpO2 99 %.  Systems examined below as of 02/26/17 General: NAD A&O x3 mood,  affect normal  HEENT: Pupils equal, extraocular movements intact no nystagmus Respiratory: not short of breath at rest or with speaking Cardiovascular: No lower extremity edema, non tender Skin: Warm dry intact with no signs of infection or rash on extremities or on axial skeleton. Abdomen: Soft nontender, no masses Neuro: Cranial nerves  intact, neurovascularly intact in all extremities with 2+ DTRs and 2+ pulses. Lymph: No lymphadenopathy appreciated today  Gait normal with good balance and coordination.  MSK: Non tender with full range of motion and good stability and symmetric strength and tone of shoulders, elbows, wrist,  knee hips and ankles bilaterally.   Foot exam shows: Pain on the  heel. Significant pes planus bilaterally with overpronation of the hindfoot. Painful over the medial calcaneal region   Procedure: Real-time Ultrasound Guided Injection of right plantar fascial Device: GE Logiq Q7 Ultrasound guided injection is preferred based studies that show increased duration, increased effect, greater accuracy, decreased procedural pain, increased response rate, and decreased cost with ultrasound guided versus blind injection.  Verbal informed consent obtained.  Time-out conducted.  Noted no overlying erythema, induration, or other signs of local infection.  Skin prepped in a sterile fashion.  Local anesthesia: Topical Ethyl chloride.  With sterile technique and under real time ultrasound guidance:  With a 21-gauge 2 inch needle patient was injected with a total of 0.5 mL of 0.5% Marcaine and 0.5 mL of Kenalog 40 mg/dL. Completed without difficulty  Pain immediately resolved suggesting accurate placement of the medication.  Advised to call if fevers/chills, erythema, induration, drainage, or persistent bleeding.  Images permanently stored and available for review in the ultrasound unit.  Impression: Technically successful ultrasound guided injection.   Impression and Recommendations:     This case required medical decision making of moderate complexity.

## 2017-02-26 ENCOUNTER — Telehealth: Payer: Self-pay | Admitting: Internal Medicine

## 2017-02-26 ENCOUNTER — Ambulatory Visit: Payer: Self-pay

## 2017-02-26 ENCOUNTER — Ambulatory Visit (INDEPENDENT_AMBULATORY_CARE_PROVIDER_SITE_OTHER): Payer: 59 | Admitting: Family Medicine

## 2017-02-26 ENCOUNTER — Encounter: Payer: Self-pay | Admitting: Family Medicine

## 2017-02-26 VITALS — BP 122/82 | HR 71 | Resp 16 | Wt 234.1 lb

## 2017-02-26 DIAGNOSIS — M722 Plantar fascial fibromatosis: Secondary | ICD-10-CM | POA: Diagnosis not present

## 2017-02-26 DIAGNOSIS — M79672 Pain in left foot: Secondary | ICD-10-CM

## 2017-02-26 MED ORDER — IBUPROFEN 800 MG PO TABS: 800.0000 mg | ORAL_TABLET | Freq: Three times a day (TID) | ORAL | 3 refills | Status: DC | PRN

## 2017-02-26 NOTE — Telephone Encounter (Signed)
Left message for patient to call back  

## 2017-02-26 NOTE — Telephone Encounter (Signed)
Pt called wanting to know if she can get the shoe insert, she will pay out of pocket for it.

## 2017-02-26 NOTE — Progress Notes (Signed)
Pre-visit discussion using our clinic review tool. No additional management support is needed unless otherwise documented below in the visit note.  

## 2017-02-26 NOTE — Assessment & Plan Note (Signed)
Patient was having worsening symptoms. Did have an injection greater than a year ago. Did do very well. Encourage her to wear the over-the-counter orthotics and we'll consider custom orthotics. We discussed icing regimen, we discussed avoiding being barefoot. Discussed weight loss. Given home exercises again. Follow-up again in 4-6 weeks

## 2017-02-26 NOTE — Patient Instructions (Signed)
Good to see you  Mary [name] is your friend.  [employer] "total support" online would be great  Ask insurance about orthotics the code would be L3030 and see how much the cover.  Avoid being barefoot.  Exercises 3 times a week.  See me again in 6 weeks.

## 2017-03-02 NOTE — Telephone Encounter (Signed)
Spoke with patient. Going to place order for her orthotics and call her back when they arrive for her to come into the office to be fitted.

## 2017-03-06 ENCOUNTER — Ambulatory Visit
Admission: RE | Admit: 2017-03-06 | Discharge: 2017-03-06 | Disposition: A | Discharge status: Home / Self Care | Payer: 59 | Source: Ambulatory Visit | Attending: Orthopedic Surgery | Admitting: Orthopedic Surgery

## 2017-03-06 DIAGNOSIS — M546 Pain in thoracic spine: Secondary | ICD-10-CM

## 2017-03-06 DIAGNOSIS — R0789 Other chest pain: Secondary | ICD-10-CM

## 2017-03-06 DIAGNOSIS — M5124 Other intervertebral disc displacement, thoracic region: Secondary | ICD-10-CM | POA: Diagnosis not present

## 2017-03-06 DIAGNOSIS — R079 Chest pain, unspecified: Secondary | ICD-10-CM | POA: Diagnosis not present

## 2017-03-06 MED ORDER — IOPAMIDOL (ISOVUE-300) INJECTION 61%
100.0000 mL | Freq: Once | INTRAVENOUS | Status: AC | PRN
  Administered 2017-03-06: 100 mL via INTRAVENOUS

## 2017-03-06 MED ORDER — GADOBENATE DIMEGLUMINE 529 MG/ML IV SOLN
20.0000 mL | Freq: Once | INTRAVENOUS | Status: AC | PRN
  Administered 2017-03-06: 20 mL via INTRAVENOUS

## 2017-03-12 ENCOUNTER — Ambulatory Visit (INDEPENDENT_AMBULATORY_CARE_PROVIDER_SITE_OTHER): Payer: 59 | Admitting: Family Medicine

## 2017-03-12 ENCOUNTER — Encounter: Payer: Self-pay | Admitting: Family Medicine

## 2017-03-12 DIAGNOSIS — M722 Plantar fascial fibromatosis: Secondary | ICD-10-CM

## 2017-03-12 NOTE — Assessment & Plan Note (Signed)
Patient is doing relatively well and we'll be hopefully doing better in the custom orthotics. Will slowly increase wear over the course the next several weeks. We discussed that adjustments may be necessary. Patient will try to make these changes and come back and see me again in 4 weeks

## 2017-03-12 NOTE — Progress Notes (Signed)
Procedure Note   Patient was fitted for a : Comfort, standard, cushioned, semi-rigid orthotic. The orthotic was heated and afterward the patient patient seated position and molded The patient was positioned in subtalar neutral position and 10 degrees of ankle dorsiflexion in a weight bearing stance. After completion of molding, patient did have orthotic management The blank was ground to a stable position for weight bearing. Size: women's 10 Base: Carbon fiber Additional Posting and Padding: Left and Right orthotic: Medial posting: 300/110, 270/90 & Lateral posting: 250/35 The patient ambulated these, and they were very comfortable.

## 2017-03-17 ENCOUNTER — Telehealth (INDEPENDENT_AMBULATORY_CARE_PROVIDER_SITE_OTHER): Payer: Self-pay | Admitting: Orthopedic Surgery

## 2017-03-17 ENCOUNTER — Telehealth: Payer: Self-pay | Admitting: Internal Medicine

## 2017-03-17 DIAGNOSIS — M546 Pain in thoracic spine: Secondary | ICD-10-CM

## 2017-03-17 NOTE — Telephone Encounter (Signed)
s/w patient. She is agreeable to this and referral was made.

## 2017-03-17 NOTE — Telephone Encounter (Signed)
Chest CT normal  MRI of back shows arthritis.  Likely fine with one area of abnormal bone growth but looks benign.   Ask Dr. [place]ou Sa, who got the MRI if he would like to do anything different but I think we can watch.

## 2017-03-17 NOTE — Telephone Encounter (Signed)
Please advise. We sent patient for Ct chest and MRI tspine and patient wants to know how to proceed? Patient reports PCP gave her results of scans.

## 2017-03-17 NOTE — Telephone Encounter (Signed)
PT WANTS TO KNOW WHERE TO GO FROM THIS POINT, SHE RECEIVED HER MRI RESULTS FROM HER REGULAR DR. AND THEY ADVISED THAT SHE FIND OUR WHAT DR. DEAN WANTS HER TO DO NOW.   [phone] AFTER 4:30 AT: [phone]

## 2017-03-17 NOTE — Telephone Encounter (Signed)
Spoke with patient in regards to results  

## 2017-03-17 NOTE — Addendum Note (Signed)
Addended byLaurann Montana on: 03/17/2017 04:37 PM   Modules accepted: Orders

## 2017-03-17 NOTE — Telephone Encounter (Signed)
Rf to fn for consideration of injections thx

## 2017-03-17 NOTE — Telephone Encounter (Signed)
Pt would like results from 4/19 , MRI and CT

## 2017-03-31 ENCOUNTER — Encounter (INDEPENDENT_AMBULATORY_CARE_PROVIDER_SITE_OTHER): Payer: Self-pay | Admitting: Physical Medicine and Rehabilitation

## 2017-03-31 ENCOUNTER — Ambulatory Visit (INDEPENDENT_AMBULATORY_CARE_PROVIDER_SITE_OTHER): Payer: 59 | Admitting: Physical Medicine and Rehabilitation

## 2017-03-31 VITALS — BP 150/102 | HR 81

## 2017-03-31 DIAGNOSIS — M47814 Spondylosis without myelopathy or radiculopathy, thoracic region: Secondary | ICD-10-CM | POA: Diagnosis not present

## 2017-03-31 DIAGNOSIS — M609 Myositis, unspecified: Secondary | ICD-10-CM

## 2017-03-31 DIAGNOSIS — M546 Pain in thoracic spine: Secondary | ICD-10-CM | POA: Diagnosis not present

## 2017-03-31 DIAGNOSIS — G8929 Other chronic pain: Secondary | ICD-10-CM

## 2017-03-31 NOTE — Progress Notes (Deleted)
Thoracic pain around level of shoulder blade and just below. Pain is right sided. Pain is constant. Pain increases with strenuous activity at work. Takes ibuprofen for the pain which helps only a little.

## 2017-04-02 ENCOUNTER — Encounter (INDEPENDENT_AMBULATORY_CARE_PROVIDER_SITE_OTHER): Payer: Self-pay | Admitting: Physical Medicine and Rehabilitation

## 2017-04-02 NOTE — Progress Notes (Signed)
Mary [name] - 56 y.o. female MRN [id number]  Date of birth: 1961-01-22  Office Visit Note: Visit Date: 03/31/2017 PCP: Hoyt Koch, MD Referred by: Hoyt Koch, *  Subjective: Chief Complaint  Patient presents with  . Middle Back - Pain   HPI: Mary [name] is a 56 year old female complaining of several months of worsening right sided shoulder blade pain at the posterior inferior angle and into the thoracic spine. She gets some pain with inspiration and deep breathing. 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 She recently saw Dr. Marlou Sa at the beginning of April with complaints of thoracic pain but also low back and hip pain. He felt like with her history of colon cancer and the fact that he was having some pain with breathing and thoracic pain he did order an MRI of the thoracic spine. This is reviewed below. Her pain complaints are basically at the level of the shoulder blade and just below mainly on the right side. She reports pain is fairly constant. It does increase with strenuous activity at work and with rotation and with breathing. She does use ibuprofen for the pain which helps only a little. She has had tramadol with no relief and muscle relaxer with no relief. She has not had any physical therapy of her thoracic spine. She did have a chest CT performed as well as the thoracic MRI. The imaging has not shown any worrisome problems and no metastatic disease. She reports some low back pain as well but no focal weakness. She's had no fevers chills or night sweats. She's had no unintended weight loss. She's had no paresthesias or numbness.    Review of Systems  Constitutional: Negative for chills, fever, malaise/fatigue and weight loss.  HENT: Negative for hearing loss and sinus pain.   Eyes: Negative for blurred vision, double vision and photophobia.  Respiratory: Negative for cough and shortness of  breath.   Cardiovascular: Negative for chest pain, palpitations and leg swelling.  Gastrointestinal: Negative for abdominal pain, nausea and vomiting.  Genitourinary: Negative for flank pain.  Musculoskeletal: Positive for back pain. Negative for myalgias.  Skin: Negative for itching and rash.  Neurological: Negative for tremors, focal weakness and weakness.  Endo/Heme/Allergies: Negative.   Psychiatric/Behavioral: Negative for depression.  All other systems reviewed and are negative.  Otherwise per HPI.  Assessment & Plan: Visit Diagnoses:  1. Chronic right-sided thoracic back pain   2. Spondylosis of thoracic region without myelopathy or radiculopathy   3. Myofascitis     Plan: Findings:  Several month history of progressively worsening and severe at times right thoracic and scapular pain at the inferior angle. I feel like most of this is myofascial pain she does have active trigger points in this area. However interestingly she does have significant findings in the thoracic spine which is fairly rare. She has at least 2 small disc herniations at look worse on the lateral imaging than the axial imaging. There is no focal stenosis or contacting the cord. The T9-10 right paracentral protrusion seems to me the one that could be painful just looking at where it extends into the canal little bit and it is right-sided. I feel like this would be a little bit lower than her pain level but is fairly close. Alternatively she does have some mild facet joint arthropathy really throughout the thoracic spine which could cause generalized thoracic pain  but I don't think it would specifically give her this right sided pain is just below the scapula. I think the best approach is to look at physical therapy for a short course with possible dry needling particularly in the area of the rhomboid musculature and inferior angle of the scapula and may be the infraspinatus. She would also benefit from manual treatment  as well. Depending on the relief she gets with that we could possibly look at T9-10 intralaminar epidural steroid injection versus diagnostic facet joint block. We discussed this at length with her and also with her history of cancer or other musculoskeletal complaints in the increased spondylosis of the thoracic spine which is somewhat unusual. I don't see any evidence of DISH syndrome. I spent more than 25 minutes speaking face-to-face with the patient with 50% of the time in counseling.    Meds & Orders: No orders of the defined types were placed in this encounter.  No orders of the defined types were placed in this encounter.  Handwritten prescription to [facility] physical therapy was given to the patient for treatment of her thoracic pain including manual treatment and dry needling.  Follow-up: No Follow-up on file.   Procedures: No procedures performed  No notes on file   Clinical History: MRI THORACIC WITHOUT AND WITH CONTRAST 03/06/2017   TECHNIQUE: Multiplanar and multiecho pulse sequences of the thoracic spine were obtained without and with intravenous contrast.  CONTRAST: 41mL MULTIHANCE GADOBENATE DIMEGLUMINE 529 MG/ML IV SOLN  COMPARISON: Chest CT today reported separately. Chest CT 04/24/2014.  FINDINGS: Limited sagittal imaging of the cervical spine today is negative except for straightening of cervical lordosis, possible mild retrolisthesis at C5-C6, and multilevel cervical disc and endplate degeneration.  Thoracic segmentation normal.  Alignment: Stable thoracic vertebral height and alignment since 2015. Mildly exaggerated thoracic kyphosis.  Vertebrae: Round 7-8 mm enhancing and STIR hyperintense area in the lower T10 vertebral body abutting the endplate (series 23, image 7). This is not correlated with any bone destruction on the 2015 chest CT, or the chest CT today, but is faintly visible on both CTs and appears stable. Furthermore this has mildly  speckled and mostly isointense intrinsic T1 signal (series 17, image 6).  There is mild degenerative appearing inferior endplate marrow edema and enhancement at T6 which appears related to a small Schmorl node.  Otherwise visible bone marrow signal is within normal limits. No suspicious or destructive osseous lesion.  Cord: Spinal cord signal is within normal limits at all visualized levels. Conus medullaris is mostly visible at T12-L1 and appears normal. No abnormal intradural enhancement.  Paraspinal and other soft tissues: Negative visualized thoracic viscera. Heterogeneous T2 signal in the visible liver appears stable to hypodense areas on the 2015 CT which were characterized as benign on a 2012 abdomen MRI. Otherwise negative upper abdominal viscera. Negative visualized posterior paraspinal soft tissues.  Disc levels:  T1-T2: Mild facet hypertrophy.  T2-T3: Minimal disc bulge.  T3-T4: Disc space loss with mildly lobulated broad-based posterior disc bulge or protrusion. Knee narrowing of the ventral CSF space but no spinal stenosis. Mild facet hypertrophy. No foraminal stenosis.  T4-T5: Disc space loss with circumferential disc bulge. Broad-based posterior component. Mild facet and ligament flavum hypertrophy. Narrowed ventral CSF space but no spinal stenosis (series 19, image 15). Borderline to mild right T4 foraminal stenosis.  T5-T6: Disc space loss with circumferential disc bulge. Mildly lobulated broad-based posterior component. Mild facet and ligament flavum hypertrophy. No spinal stenosis. Borderline to mild bilateral  T5 foraminal stenosis.  T6-T7: Disc space loss with mild circumferential disc bulge. Mild broad-based posterior component. Mild facet and ligament flavum hypertrophy greater on the right. No stenosis.  T7-T8: Mild circumferential disc bulge. Mild facet hypertrophy. Borderline to mild left greater than right T7 foraminal  stenosis.  T8-T9: Minimal disc bulge. Mild facet hypertrophy. Mild bilateral T8 foraminal stenosis.  T9-T10: Disc space loss and circumferential disc bulge with broad-based right paracentral component. Mild facet and ligament flavum hypertrophy. No significant spinal stenosis (series 19, image 34). Mild right T9 foraminal stenosis.  T10-T11: Negative disc. Moderate facet hypertrophy. No stenosis.  T11-T12: Negative.  T12-L1: Negative.  IMPRESSION: 1. No metastatic disease identified in the thoracic spine. An 8 mm round enhancing area in the lower T10 vertebral body is most compatible with a benign osseous hemangioma. If pain symptoms persist and remain unexplained a 3-6 month repeat thoracic MRI could confirm stability. 2. Widespread thoracic disc degeneration and disc bulging. Superimposed widespread thoracic posterior element hypertrophy. There is no no significant thoracic spinal stenosis, but there is intermittent mild degenerative neural foraminal stenosis at the: Right T4, bilateral T5, right T7, bilateral T8, and right T9 nerve levels. 3. Partially visible cervical spine disc and endplate degeneration. 4. Partially visible chronic benign hepatic lesions. 5. Chest CT today reported separately.  She reports that she has never smoked. She has never used smokeless tobacco. No results for input(s): HGBA1C, LABURIC in the last 8760 hours.  Objective:  VS:  HT:    WT:   BMI:     BP:(!) 150/102  HR:81bpm  TEMP: ( )  RESP:  Physical Exam  Constitutional: She is oriented to person, place, and time. She appears well-developed and well-nourished. No distress.  HENT:  Head: Normocephalic and atraumatic.  Nose: Nose normal.  Mouth/Throat: Oropharynx is clear and moist.  Eyes: Conjunctivae and EOM are normal. Pupils are equal, round, and reactive to light.  Neck: Normal range of motion. Neck supple.  Cardiovascular: Normal rate, regular rhythm and intact distal pulses.    Pulmonary/Chest: Effort normal. No respiratory distress.  Abdominal: She exhibits no distension. There is no guarding.  Musculoskeletal:  Patient ambulates without aid she is slow to rise from a seated position. She does have tenderness to palpation along the rhomboid musculature and just inferior area of the right scapula. She has some pain over the vertebral bodies of the thoracic spine. She has good upper extremity strength bilaterally without any deficits. She has good lower extremity strength without deficits. She has no pain with hip rotation. She has no clonus bilaterally and she has a negative Hoffmann's bilaterally. She has no scapular winging.  Neurological: She is alert and oriented to person, place, and time. She exhibits normal muscle tone. Coordination normal.  Skin: Skin is warm and dry. No rash noted. No erythema.  Psychiatric: She has a normal mood and affect. Her behavior is normal.  Nursing note and vitals reviewed.   Ortho Exam Imaging: No results found.  Past Medical/Family/Surgical/Social History: Medications & Allergies reviewed per EMR Patient Active Problem List   Diagnosis Date Noted  . Cellulitis 04/04/2016  . Abnormal x-ray 04/04/2016  . Hearing loss due to cerumen impaction 01/14/2016  . Impaired fasting blood sugar 01/14/2016  . Plantar fasciitis, right 11/22/2015  . Right calcaneal fracture 08/15/2015  . OSA (obstructive sleep apnea) 09/26/2014  . Peripheral neuropathy due to chemotherapy ([place]) 06/05/2014  . Obesity 06/05/2014  . history of DVT (deep venous thrombosis) 02/20/2014  .  GERD (gastroesophageal reflux disease) 02/20/2014  . Hernia 09/22/2012  . Iron deficiency anemia, unspecified 08/30/2011  . Colon cancer ([place]) 08/29/2011  . Cancer of descending colon ([place]) 05/12/2011   Past Medical History:  Diagnosis Date  . Adenocarcinoma sigmoid colon 05/12/11   Stage III(pT2pN2b) descending sigmoid  . Arm DVT (deep venous thromboembolism), acute  ([place]) 07/04/11   r/t PAC  . Blood transfusion   . Cancer of descending colon ([place]) 05/12/2011  . GERD (gastroesophageal reflux disease)   . History of migraine headaches   . Incisional hernia   . iron deficiency anemia   . Multiple hemangiomas    Liver  . Neuropathy due to drug ([place]) 2012   Early--assoc. w/prolonged cold sensitivity--Oxaliplatin related  . Pneumothorax 07/19/11   Bilateral  . Renal cysts, acquired, bilateral    Family History  Problem Relation Age of Onset  . Diabetes Mother   . Hypertension Mother   . Cancer Father        leukemia  . Cancer Maternal Uncle        lung  . Cancer Maternal Uncle        colon  . Cancer Maternal Uncle        prostate  . Cancer Brother        Deceased  . Other Brother        MVA  . Healthy Sister   . Healthy Daughter        x 3   Past Surgical History:  Procedure Laterality Date  . ABDOMINAL HYSTERECTOMY    . COLON SURGERY  05/17/11   Partial colectomy  . HERNIA REPAIR  09/06/12   lap incisional hernia  . INSERTION OF MESH  09/06/2012   Procedure: INSERTION OF MESH;  Surgeon: Gwenyth Ober, MD;  Location: [place];  Service: General;  Laterality: N/A;  . PORT-A-CATH REMOVAL  05/06/2012   Procedure: MINOR REMOVAL PORT-A-CATH;  Surgeon: Gwenyth Ober, MD;  Location: [place];  Service: General;  Laterality: Right;  . PORTACATH PLACEMENT  07/20/11   Right side placed  . PORTACATH PLACEMENT     left side & right side  . VENTRAL HERNIA REPAIR  09/06/2012   Procedure: LAPAROSCOPIC VENTRAL HERNIA;  Surgeon: Gwenyth Ober, MD;  Location: [place];  Service: General;  Laterality: N/A;   Social History   Occupational History  . 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 Smoking status: Never Smoker  . Smokeless tobacco: Never Used  . Alcohol use No  . Drug use: No  . Sexual activity: Yes

## 2017-04-09 ENCOUNTER — Encounter: Payer: Self-pay | Admitting: Family Medicine

## 2017-04-09 ENCOUNTER — Ambulatory Visit (INDEPENDENT_AMBULATORY_CARE_PROVIDER_SITE_OTHER): Payer: 59 | Admitting: Family Medicine

## 2017-04-09 DIAGNOSIS — M722 Plantar fascial fibromatosis: Secondary | ICD-10-CM

## 2017-04-09 DIAGNOSIS — M546 Pain in thoracic spine: Secondary | ICD-10-CM | POA: Diagnosis not present

## 2017-04-09 NOTE — Assessment & Plan Note (Signed)
Improved after the injections. Patient 70% better. Continue conservative therapy. Follow-up again as needed

## 2017-04-09 NOTE — Patient Instructions (Addendum)
Good to see you  Alvera Singh is your friend.  Do not lace the eye of the shoe near your toes. Try to do the exercises Avoid being barefoot.   See me again in 8 weeks if not completely gone.

## 2017-04-09 NOTE — Progress Notes (Signed)
[name] [address] [place], [place] [address] Phone: [phone] Subjective:    I'm seeing this patient by the request  of:    CC: Foot pain follow-up  MMN:[id number]  Mary [name] is a 56 y.o. female coming in with complaint of plantar fasciitis. Patient and see me previously and was having worsening symptoms. Given an injection back on 02/26/2017. Patient was also fitted for custom orthotics. Patient was to continue conservative therapy and home exercises. Patient states Feeling approximately 70% better. Not having as much severe pain. [name]. Patient states that the custom orthotics have been helpful but it does take some time to use to it. Still has discomfort when she doesn't wear the right shoes.     Past Medical History:  Diagnosis Date  . Adenocarcinoma sigmoid colon 05/12/11   Stage III(pT2pN2b) descending sigmoid  . Arm DVT (deep venous thromboembolism), acute ([place]) 07/04/11   r/t PAC  . Blood transfusion   . Cancer of descending colon ([place]) 05/12/2011  . GERD (gastroesophageal reflux disease)   . History of migraine headaches   . Incisional hernia   . iron deficiency anemia   . Multiple hemangiomas    Liver  . Neuropathy due to drug ([place]) 2012   Early--assoc. w/prolonged cold sensitivity--Oxaliplatin related  . Pneumothorax 07/19/11   Bilateral  . Renal cysts, acquired, bilateral    Past Surgical History:  Procedure Laterality Date  . ABDOMINAL HYSTERECTOMY    . COLON SURGERY  05/17/11   Partial colectomy  . HERNIA REPAIR  09/06/12   lap incisional hernia  . INSERTION OF MESH  09/06/2012   Procedure: INSERTION OF MESH;  Surgeon: Gwenyth Ober, MD;  Location: [place];  Service: General;  Laterality: N/A;  . PORT-A-CATH REMOVAL  05/06/2012   Procedure: MINOR REMOVAL PORT-A-CATH;  Surgeon: Gwenyth Ober, MD;  Location: [place];  Service: General;  Laterality: Right;  . PORTACATH PLACEMENT  07/20/11   Right side placed  . PORTACATH PLACEMENT     left side & right side  . VENTRAL HERNIA REPAIR  09/06/2012   Procedure: LAPAROSCOPIC VENTRAL HERNIA;  Surgeon: Gwenyth Ober, MD;  Location: [place];  Service: General;  Laterality: N/A;   Social History   Social History  . Marital status: Married    Spouse name: N/A  . Number of children: 3  . Years of education: N/A   Occupational History  . 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 Smoking status: Never Smoker  . Smokeless tobacco: Never Used  . Alcohol use No  . Drug use: No  . Sexual activity: Yes   Other Topics Concern  . None   Social History Narrative   Lives with husband in a 1 story home.  Has 3 daughters and 7 grandkids and one on the way.     Works as a Merchandiser, retail.    Education: high school.   No Known Allergies Family History  Problem Relation Age of Onset  . Diabetes Mother   . Hypertension Mother   . Cancer Father        leukemia  . Cancer Maternal Uncle        lung  . Cancer Maternal Uncle        colon  . Cancer Maternal Uncle        prostate  . Cancer Brother        Deceased  .  Other Brother        MVA  . Healthy Sister   . Healthy Daughter        x 3    Past medical history, social, surgical and family history all reviewed in electronic medical record.  No pertanent information unless stated regarding to the chief complaint.   Review of Systems:Review of systems updated and as accurate as of 04/09/17  No headache, visual changes, nausea, vomiting, diarrhea, constipation, dizziness, abdominal pain, skin rash, fevers, chills, night sweats, weight loss, swollen lymph nodes, body aches, joint swelling, muscle aches, chest pain, shortness of breath, mood changes.   Objective  Blood pressure 134/88, pulse 62, height 5\' 11"  (1.803 m), weight 245 lb (111.1 kg). Systems examined below as of 04/09/17   General: No apparent distress alert and oriented x3 mood and affect normal, dressed  appropriately.  HEENT: Pupils equal, extraocular movements intact  Respiratory: Patient's speak in full sentences and does not appear short of breath  Cardiovascular: No lower extremity edema, non tender, no erythema  Skin: Warm dry intact with no signs of infection or rash on extremities or on axial skeleton.  Abdomen: Soft nontender  Neuro: Cranial nerves II through XII are intact, neurovascularly intact in all extremities with 2+ DTRs and 2+ pulses.  Lymph: No lymphadenopathy of posterior or anterior cervical chain or axillae bilaterally.  Gait normal with good balance and coordination.  MSK:  Non tender with full range of motion and good stability and symmetric strength and tone of shoulders, elbows, wrist, hip, knee and bilaterally.  Foot exam shows the patient December 10 of the longitudinal arch as well as the transverse arch. Patient does have a rigid midfoot secondary to arthritis. No tenderness over the medial calcaneal region or over the plantar fascia.     Impression and Recommendations:     This case required medical decision making of moderate complexity.      Note: This dictation was prepared with Dragon dictation along with smaller phrase technology. Any transcriptional errors that result from this process are unintentional.

## 2017-05-04 ENCOUNTER — Ambulatory Visit (INDEPENDENT_AMBULATORY_CARE_PROVIDER_SITE_OTHER): Payer: 59 | Admitting: Nurse Practitioner

## 2017-05-04 ENCOUNTER — Encounter: Payer: Self-pay | Admitting: Nurse Practitioner

## 2017-05-04 ENCOUNTER — Ambulatory Visit: Payer: 59 | Admitting: Internal Medicine

## 2017-05-04 VITALS — BP 138/82 | HR 77 | Temp 97.9°F | Ht 71.0 in | Wt 247.0 lb

## 2017-05-04 DIAGNOSIS — M25471 Effusion, right ankle: Secondary | ICD-10-CM | POA: Diagnosis not present

## 2017-05-04 DIAGNOSIS — R51 Headache: Secondary | ICD-10-CM | POA: Diagnosis not present

## 2017-05-04 DIAGNOSIS — G8929 Other chronic pain: Secondary | ICD-10-CM

## 2017-05-04 DIAGNOSIS — C189 Malignant neoplasm of colon, unspecified: Secondary | ICD-10-CM | POA: Diagnosis not present

## 2017-05-04 DIAGNOSIS — M25472 Effusion, left ankle: Secondary | ICD-10-CM

## 2017-05-04 DIAGNOSIS — R519 Headache, unspecified: Secondary | ICD-10-CM | POA: Insufficient documentation

## 2017-05-04 MED ORDER — MEDICAL COMPRESSION STOCKINGS MISC: 1.0000 [IU] | Freq: Every day | 0 refills | Status: DC

## 2017-05-04 MED ORDER — FUROSEMIDE 20 MG PO TABS
10.0000 mg | ORAL_TABLET | Freq: Every day | ORAL | 0 refills | Status: DC | PRN
Start: 2017-05-04 — End: 2019-01-09

## 2017-05-04 MED ORDER — FUROSEMIDE 20 MG PO TABS: 10.0000 mg | ORAL_TABLET | Freq: Every day | ORAL | 0 refills | Status: DC | PRN

## 2017-05-04 MED ORDER — TOPIRAMATE 100 MG PO TABS: 100.0000 mg | ORAL_TABLET | Freq: Every evening | ORAL | 1 refills | Status: DC | PRN

## 2017-05-04 NOTE — Progress Notes (Signed)
Subjective:  Patient ID: Mary [name], female    DOB: [birth year]  Age: 56 y.o. MRN: [id number]  CC: Edema (feet swelling--mostly at night--going on for 4 mo/request refill for topamax/ handicap placard consult?)  HPI  Edema: Chronic per patient. Mild improvement with elevation. Will like a prescription for compression stocking and diuretic. Does not maintain low salt diet.  Handicap form was complete by oncology when undergoing chemotherapy. Now in remission. Has OV with oncology once a year.  Outpatient Medications Prior to Visit  Medication Sig Dispense Refill  . hydroxypropyl methylcellulose (ISOPTO TEARS) 2.5 % ophthalmic solution Place 1 drop into both eyes daily as needed for dry eyes.    Marland Kitchen ibuprofen (ADVIL,MOTRIN) 800 MG tablet Take 1 tablet (800 mg total) by mouth every 8 (eight) hours as needed. 90 tablet 3  . polyethylene glycol (MIRALAX / GLYCOLAX) packet Take 17 g by mouth daily. 14 each 0  . topiramate (TOPAMAX) 100 MG tablet Take 2 tablets (200 mg total) by mouth at bedtime as needed. Reported on 04/03/2016 90 tablet 1   No facility-administered medications prior to visit.     ROS See HPI  Objective:  BP 138/82   Pulse 77   Temp 97.9 F (36.6 C)   Ht 5\' 11"  (1.803 m)   Wt 247 lb (112 kg)   SpO2 99%   BMI 34.45 kg/m   BP Readings from Last 3 Encounters:  05/04/17 138/82  04/09/17 134/88  03/31/17 (!) 150/102    Wt Readings from Last 3 Encounters:  05/04/17 247 lb (112 kg)  04/09/17 245 lb (111.1 kg)  02/26/17 234 lb 2 oz (106.2 kg)    Physical Exam  Constitutional: She is oriented to person, place, and time. No distress.  Cardiovascular: Normal rate and regular rhythm.   Pulmonary/Chest: Effort normal.  Musculoskeletal: Normal range of motion. She exhibits edema. She exhibits no tenderness.  Bilateral ankle edema (non pitting)  Neurological: She is alert and oriented to person, place, and time.  Skin: Skin is warm and dry.  Vitals  reviewed.   Lab Results  Component Value Date   WBC 7.2 04/24/2014   HGB 11.9 04/24/2014   HCT 38.6 04/24/2014   PLT 196 04/24/2014   GLUCOSE 106 (H) 01/14/2016   CHOL 191 01/14/2016   TRIG 80.0 01/14/2016   HDL 52.30 01/14/2016   LDLCALC 123 (H) 01/14/2016   ALT 17 01/14/2016   AST 14 01/14/2016   NA 140 01/14/2016   K 4.0 01/14/2016   CL 105 01/14/2016   CREATININE 1.12 01/14/2016   BUN 14 01/14/2016   CO2 30 01/14/2016   INR 1.15 02/20/2014   HGBA1C 6.7 (H) 01/14/2016    Ct Chest W Contrast  Result Date: 03/06/2017 CLINICAL DATA:  RT SIDE MID BACK PAIN X 2.5 MO SX PORTOCATH, PARTIAL HYSTERECTOMY CA COLON SX CHEMO X 80YRS NO HX HTN, DM PRIORS PACS^ EXAM: CT CHEST WITH CONTRAST TECHNIQUE: Multidetector CT imaging of the chest was performed during intravenous contrast administration. CONTRAST:  138mL ISOVUE-300 IOPAMIDOL (ISOVUE-300) INJECTION 61% COMPARISON:  04/24/2014 FINDINGS: Cardiovascular: No significant vascular findings. Normal heart size. No pericardial effusion. Mediastinum/Nodes: No enlarged mediastinal, hilar, or axillary lymph nodes. Thyroid gland, trachea, and esophagus demonstrate no significant findings. Lungs/Pleura: Lungs are clear. No pleural effusion or pneumothorax. Upper Abdomen: Stable liver lesions previously characterized as hemangiomas and cysts. No acute findings. Musculoskeletal: Anterior vertebral endplate spurring at multiple levels in the mid thoracic spine. No fracture or worrisome bone  lesion. IMPRESSION: 1. No acute findings. Electronically Signed   By: Lucrezia Europe M.D.   On: 03/06/2017 12:37   Mr Thoracic Spine W Wo Contrast  Result Date: 03/06/2017 CLINICAL DATA:  56 year old female with thoracic back pain. Mid and low back pain radiating anteriorly to the right for 10 weeks. Chest wall pain. No known injury. Personal history of treated colon cancer. EXAM: MRI THORACIC WITHOUT AND WITH CONTRAST TECHNIQUE: Multiplanar and multiecho pulse sequences of  the thoracic spine were obtained without and with intravenous contrast. CONTRAST:  66mL MULTIHANCE GADOBENATE DIMEGLUMINE 529 MG/ML IV SOLN COMPARISON:  Chest CT today reported separately. Chest CT 04/24/2014. FINDINGS: Limited sagittal imaging of the cervical spine today is negative except for straightening of cervical lordosis, possible mild retrolisthesis at C5-C6, and multilevel cervical disc and endplate degeneration. Thoracic segmentation normal. Alignment: Stable thoracic vertebral height and alignment since 2015. Mildly exaggerated thoracic kyphosis. Vertebrae: Round 7-8 mm enhancing and STIR hyperintense area in the lower T10 vertebral body abutting the endplate (series 23, image 7). This is not correlated with any bone destruction on the 2015 chest CT, or the chest CT today, but is faintly visible on both CTs and appears stable. Furthermore this has mildly speckled and mostly isointense intrinsic T1 signal (series 17, image 6). There is mild degenerative appearing inferior endplate marrow edema and enhancement at T6 which appears related to a small Schmorl node. Otherwise visible bone marrow signal is within normal limits. No suspicious or destructive osseous lesion. Cord: Spinal cord signal is within normal limits at all visualized levels. Conus medullaris is mostly visible at T12-L1 and appears normal. No abnormal intradural enhancement. Paraspinal and other soft tissues: Negative visualized thoracic viscera. Heterogeneous T2 signal in the visible liver appears stable to hypodense areas on the 2015 CT which were characterized as benign on a 2012 abdomen MRI. Otherwise negative upper abdominal viscera. Negative visualized posterior paraspinal soft tissues. Disc levels: T1-T2: Mild facet hypertrophy. T2-T3: Minimal disc bulge. T3-T4: Disc space loss with mildly lobulated broad-based posterior disc bulge or protrusion. Knee narrowing of the ventral CSF space but no spinal stenosis. Mild facet hypertrophy.  No foraminal stenosis. T4-T5: Disc space loss with circumferential disc bulge. Broad-based posterior component. Mild facet and ligament flavum hypertrophy. Narrowed ventral CSF space but no spinal stenosis (series 19, image 15). Borderline to mild right T4 foraminal stenosis. T5-T6: Disc space loss with circumferential disc bulge. Mildly lobulated broad-based posterior component. Mild facet and ligament flavum hypertrophy. No spinal stenosis. Borderline to mild bilateral T5 foraminal stenosis. T6-T7: Disc space loss with mild circumferential disc bulge. Mild broad-based posterior component. Mild facet and ligament flavum hypertrophy greater on the right. No stenosis. T7-T8: Mild circumferential disc bulge. Mild facet hypertrophy. Borderline to mild left greater than right T7 foraminal stenosis. T8-T9: Minimal disc bulge. Mild facet hypertrophy. Mild bilateral T8 foraminal stenosis. T9-T10: Disc space loss and circumferential disc bulge with broad-based right paracentral component. Mild facet and ligament flavum hypertrophy. No significant spinal stenosis (series 19, image 34). Mild right T9 foraminal stenosis. T10-T11: Negative disc.  Moderate facet hypertrophy.  No stenosis. T11-T12: Negative. T12-L1:  Negative. IMPRESSION: 1. No metastatic disease identified in the thoracic spine. An 8 mm round enhancing area in the lower T10 vertebral body is most compatible with a benign osseous hemangioma. If pain symptoms persist and remain unexplained a 3-6 month repeat thoracic MRI could confirm stability. 2. Widespread thoracic disc degeneration and disc bulging. Superimposed widespread thoracic posterior element hypertrophy. There is no  no significant thoracic spinal stenosis, but there is intermittent mild degenerative neural foraminal stenosis at the: Right T4, bilateral T5, right T7, bilateral T8, and right T9 nerve levels. 3. Partially visible cervical spine disc and endplate degeneration. 4. Partially visible chronic  benign hepatic lesions. 5. Chest CT today reported separately. Electronically Signed   By: Genevie Ann M.D.   On: 03/06/2017 10:51    Assessment & Plan:   Sundra was seen today for edema.  Diagnoses and all orders for this visit:  Ankle edema, bilateral -     Discontinue: furosemide (LASIX) 20 MG tablet; Take 0.5 tablets (10 mg total) by mouth daily as needed. -     Elastic Bandages & Supports (MEDICAL COMPRESSION STOCKINGS) MISC; 1 Units by Does not apply route daily. Knee high, moderate compression -     furosemide (LASIX) 20 MG tablet; Take 0.5 tablets (10 mg total) by mouth daily as needed.  Malignant neoplasm of colon, unspecified part of colon ([place]) -     topiramate (TOPAMAX) 100 MG tablet; Take 1 tablet (100 mg total) by mouth at bedtime as needed. Reported on 04/03/2016  Chronic nonintractable headache, unspecified headache type -     Discontinue: topiramate (TOPAMAX) 100 MG tablet; Take 1 tablet (100 mg total) by mouth at bedtime as needed. Reported on 04/03/2016   I am having Ms. Rajagopalan start on Medical Compression Stockings. I am also having her maintain her hydroxypropyl methylcellulose / hypromellose, polyethylene glycol, ibuprofen, topiramate, and furosemide.  Meds ordered this encounter  Medications  . DISCONTD: furosemide (LASIX) 20 MG tablet    Sig: Take 0.5 tablets (10 mg total) by mouth daily as needed.    Dispense:  7 tablet    Refill:  0    Order Specific Question:   Supervising Provider    Answer:   Cassandria Anger [[id number]]  . Elastic Bandages & Supports (MEDICAL COMPRESSION STOCKINGS) MISC    Sig: 1 Units by Does not apply route daily. Knee high, moderate compression    Dispense:  2 each    Refill:  0    Order Specific Question:   Supervising Provider    Answer:   Cassandria Anger [[id number]]  . DISCONTD: topiramate (TOPAMAX) 100 MG tablet    Sig: Take 1 tablet (100 mg total) by mouth at bedtime as needed. Reported on 04/03/2016    Dispense:  90 tablet     Refill:  1    Order Specific Question:   Supervising Provider    Answer:   Cassandria Anger [[id number]]  . topiramate (TOPAMAX) 100 MG tablet    Sig: Take 1 tablet (100 mg total) by mouth at bedtime as needed. Reported on 04/03/2016    Dispense:  90 tablet    Refill:  1  . furosemide (LASIX) 20 MG tablet    Sig: Take 0.5 tablets (10 mg total) by mouth daily as needed.    Dispense:  7 tablet    Refill:  0    Follow-up: Return in about 3 months (around 08/04/2017) for edema with Dr. Sharlet Salina.  Wilfred Lacy, NP

## 2017-05-04 NOTE — Patient Instructions (Addendum)
Wear compression during the day and off at night. Maintain low salt diet.  Contact oncology about renewing handicap placard.  Edema Edema is when you have too much fluid in your body or under your skin. Edema may make your legs, feet, and ankles swell up. Swelling is also common in looser tissues, like around your eyes. This is a common condition. It gets more common as you get older. There are many possible causes of edema. Eating too much salt (sodium) and being on your feet or sitting for a long time can cause edema in your legs, feet, and ankles. Hot weather may make edema worse. Edema is usually painless. Your skin may look swollen or shiny. Follow these instructions at home:  Keep the swollen body part raised (elevated) above the level of your heart when you are sitting or lying down.  Do not sit still or stand for a long time.  Do not wear tight clothes. Do not wear garters on your upper legs.  Exercise your legs. This can help the swelling go down.  Wear elastic bandages or support stockings as told by your doctor.  Eat a low-salt (low-sodium) diet to reduce fluid as told by your doctor.  Depending on the cause of your swelling, you may need to limit how much fluid you drink (fluid restriction).  Take over-the-counter and prescription medicines only as told by your doctor. Contact a doctor if:  Treatment is not working.  You have heart, liver, or kidney disease and have symptoms of edema.  You have sudden and unexplained weight gain. Get help right away if:  You have shortness of breath or chest pain.  You cannot breathe when you lie down.  You have pain, redness, or warmth in the swollen areas.  You have heart, liver, or kidney disease and get edema all of a sudden.  You have a fever and your symptoms get worse all of a sudden. Summary  Edema is when you have too much fluid in your body or under your skin.  Edema may make your legs, feet, and ankles swell up.  Swelling is also common in looser tissues, like around your eyes.  Raise (elevate) the swollen body part above the level of your heart when you are sitting or lying down.  Follow your doctor's instructions about diet and how much fluid you can drink (fluid restriction). This information is not intended to replace advice given to you by your health care provider. Make sure you discuss any questions you have with your health care provider. Document Released: 04/14/2008 Document Revised: 11/14/2016 Document Reviewed: 11/14/2016 Elsevier Interactive Patient Education  2017 [employer].

## 2017-05-30 ENCOUNTER — Encounter (HOSPITAL_COMMUNITY): Payer: Self-pay

## 2017-05-30 ENCOUNTER — Emergency Department (HOSPITAL_COMMUNITY): Payer: 59

## 2017-05-30 ENCOUNTER — Other Ambulatory Visit: Payer: Self-pay

## 2017-05-30 ENCOUNTER — Observation Stay (HOSPITAL_COMMUNITY)
Admission: EM | Admit: 2017-05-30 | Discharge: 2017-05-31 | Disposition: A | Discharge status: Home / Self Care | Payer: 59 | Attending: Internal Medicine | Admitting: Internal Medicine

## 2017-05-30 DIAGNOSIS — R079 Chest pain, unspecified: Secondary | ICD-10-CM

## 2017-05-30 DIAGNOSIS — R0789 Other chest pain: Principal | ICD-10-CM | POA: Insufficient documentation

## 2017-05-30 DIAGNOSIS — I200 Unstable angina: Secondary | ICD-10-CM | POA: Diagnosis not present

## 2017-05-30 DIAGNOSIS — I10 Essential (primary) hypertension: Secondary | ICD-10-CM | POA: Diagnosis not present

## 2017-05-30 DIAGNOSIS — I82409 Acute embolism and thrombosis of unspecified deep veins of unspecified lower extremity: Secondary | ICD-10-CM | POA: Diagnosis present

## 2017-05-30 DIAGNOSIS — K219 Gastro-esophageal reflux disease without esophagitis: Secondary | ICD-10-CM | POA: Diagnosis present

## 2017-05-30 DIAGNOSIS — G4733 Obstructive sleep apnea (adult) (pediatric): Secondary | ICD-10-CM | POA: Diagnosis present

## 2017-05-30 DIAGNOSIS — E669 Obesity, unspecified: Secondary | ICD-10-CM | POA: Diagnosis not present

## 2017-05-30 DIAGNOSIS — R072 Precordial pain: Secondary | ICD-10-CM | POA: Diagnosis not present

## 2017-05-30 DIAGNOSIS — C186 Malignant neoplasm of descending colon: Secondary | ICD-10-CM | POA: Diagnosis not present

## 2017-05-30 DIAGNOSIS — Z86718 Personal history of other venous thrombosis and embolism: Secondary | ICD-10-CM | POA: Diagnosis not present

## 2017-05-30 DIAGNOSIS — Z85038 Personal history of other malignant neoplasm of large intestine: Secondary | ICD-10-CM | POA: Insufficient documentation

## 2017-05-30 DIAGNOSIS — C189 Malignant neoplasm of colon, unspecified: Secondary | ICD-10-CM | POA: Diagnosis present

## 2017-05-30 LAB — TROPONIN I
Troponin I: <0.03 ng/mL (ref <0.03)
Troponin I: <0.03 ng/mL (ref <0.03)

## 2017-05-30 LAB — BASIC METABOLIC PANEL
Anion gap: 9 (ref 5–15)
BUN: 17 mg/dL (ref 6–20)
CO2: 24 mmol/L (ref 22–32)
Calcium: 9.1 mg/dL (ref 8.9–10.3)
Chloride: 106 mmol/L (ref 101–111)
Creatinine, Ser: 1.13 mg/dL — ABNORMAL HIGH (ref 0.44–1.00)
GFR calc Af Amer: >60 mL/min (ref >60)
GFR calc non Af Amer: 54 mL/min — ABNORMAL LOW (ref >60)
Glucose, Bld: 109 mg/dL — ABNORMAL HIGH (ref 65–99)
Potassium: 3.7 mmol/L (ref 3.5–5.1)
Sodium: 139 mmol/L (ref 135–145)

## 2017-05-30 LAB — CBC
HCT: 36.2 % (ref 36.0–46.0)
Hemoglobin: 12.1 g/dL (ref 12.0–15.0)
MCH: 22.6 pg — ABNORMAL LOW (ref 26.0–34.0)
MCHC: 33.4 g/dL (ref 30.0–36.0)
MCV: 67.5 fL — ABNORMAL LOW (ref 78.0–100.0)
Platelets: 168 10*3/uL (ref 150–400)
RBC: 5.36 MIL/uL — ABNORMAL HIGH (ref 3.87–5.11)
RDW: 16.3 % — ABNORMAL HIGH (ref 11.5–15.5)
WBC: 6 10*3/uL (ref 4.0–10.5)

## 2017-05-30 LAB — D-DIMER, QUANTITATIVE: D-Dimer, Quant: 0.32 ug/mL-FEU (ref 0.00–0.50)

## 2017-05-30 LAB — POCT I-STAT TROPONIN I: Troponin i, poc: 0 ng/mL (ref 0.00–0.08)

## 2017-05-30 LAB — CREATININE, SERUM
Creatinine, Ser: 1.06 mg/dL — ABNORMAL HIGH (ref 0.44–1.00)
GFR calc Af Amer: >60 mL/min (ref >60)
GFR calc non Af Amer: 58 mL/min — ABNORMAL LOW (ref >60)

## 2017-05-30 MED ORDER — ONDANSETRON HCL 4 MG PO TABS: 4.0000 mg | ORAL_TABLET | Freq: Four times a day (QID) | ORAL | Status: DC | PRN

## 2017-05-30 MED ORDER — ASPIRIN EC 81 MG PO TBEC
81.0000 mg | DELAYED_RELEASE_TABLET | Freq: Every day | ORAL | Status: DC
  Administered 2017-05-31: 81 mg via ORAL
  Filled 2017-05-30: qty 1

## 2017-05-30 MED ORDER — HYDRALAZINE HCL 20 MG/ML IJ SOLN
10.0000 mg | Freq: Three times a day (TID) | INTRAMUSCULAR | Status: DC | PRN
  Filled 2017-05-30: qty 0.5

## 2017-05-30 MED ORDER — ACETAMINOPHEN 325 MG PO TABS
650.0000 mg | ORAL_TABLET | Freq: Four times a day (QID) | ORAL | Status: DC | PRN
  Administered 2017-05-30 – 2017-05-31 (×2): 650 mg via ORAL
  Filled 2017-05-30: qty 2

## 2017-05-30 MED ORDER — AMLODIPINE BESYLATE 5 MG PO TABS
5.0000 mg | ORAL_TABLET | Freq: Every day | ORAL | Status: DC
  Administered 2017-05-30 – 2017-05-31 (×2): 5 mg via ORAL
  Filled 2017-05-30 (×2): qty 1

## 2017-05-30 MED ORDER — TOPIRAMATE 100 MG PO TABS
100.0000 mg | ORAL_TABLET | Freq: Every day | ORAL | Status: DC
  Administered 2017-05-30: 100 mg via ORAL
  Filled 2017-05-30: qty 1

## 2017-05-30 MED ORDER — ASPIRIN EC 81 MG PO TBEC
162.0000 mg | DELAYED_RELEASE_TABLET | Freq: Once | ORAL | Status: DC
  Filled 2017-05-30: qty 2

## 2017-05-30 MED ORDER — NITROGLYCERIN 0.4 MG SL SUBL
0.4000 mg | SUBLINGUAL_TABLET | Freq: Once | SUBLINGUAL | Status: DC
  Filled 2017-05-30: qty 1

## 2017-05-30 MED ORDER — ONDANSETRON HCL 4 MG/2ML IJ SOLN: 4.0000 mg | Freq: Four times a day (QID) | INTRAMUSCULAR | Status: DC | PRN

## 2017-05-30 MED ORDER — NITROGLYCERIN 0.4 MG SL SUBL
0.4000 mg | SUBLINGUAL_TABLET | SUBLINGUAL | Status: DC | PRN
  Administered 2017-05-30 (×3): 0.4 mg via SUBLINGUAL
  Filled 2017-05-30 (×2): qty 1

## 2017-05-30 MED ORDER — ASPIRIN 81 MG PO CHEW
162.0000 mg | CHEWABLE_TABLET | Freq: Once | ORAL | Status: AC
  Administered 2017-05-30: 162 mg via ORAL
  Filled 2017-05-30: qty 2

## 2017-05-30 MED ORDER — ENOXAPARIN SODIUM 60 MG/0.6ML ~~LOC~~ SOLN
0.5000 mg/kg | SUBCUTANEOUS | Status: DC
  Administered 2017-05-30: 50 mg via SUBCUTANEOUS
  Filled 2017-05-30: qty 0.6

## 2017-05-30 MED ORDER — LEVALBUTEROL HCL 0.63 MG/3ML IN NEBU: 0.6300 mg | INHALATION_SOLUTION | Freq: Four times a day (QID) | RESPIRATORY_TRACT | Status: DC | PRN

## 2017-05-30 MED ORDER — POLYETHYLENE GLYCOL 3350 17 G PO PACK
17.0000 g | PACK | Freq: Every day | ORAL | Status: DC
  Administered 2017-05-30 – 2017-05-31 (×2): 17 g via ORAL
  Filled 2017-05-30 (×2): qty 1

## 2017-05-30 MED ORDER — PANTOPRAZOLE SODIUM 40 MG PO TBEC
40.0000 mg | DELAYED_RELEASE_TABLET | Freq: Every day | ORAL | Status: DC
  Administered 2017-05-30 – 2017-05-31 (×2): 40 mg via ORAL
  Filled 2017-05-30 (×2): qty 1

## 2017-05-30 MED ORDER — SODIUM CHLORIDE 0.9 % IV SOLN
INTRAVENOUS | Status: DC
  Administered 2017-05-30 – 2017-05-31 (×2): via INTRAVENOUS

## 2017-05-30 MED ORDER — ACETAMINOPHEN 650 MG RE SUPP: 650.0000 mg | Freq: Four times a day (QID) | RECTAL | Status: DC | PRN

## 2017-05-30 NOTE — Progress Notes (Signed)
Pt to get a [facility] tomorrow     Mertie Moores, MD  05/30/2017 4:22 PM    [address],  [address] [place], [place]  [id number] Pager [phone] Phone: [phone]; Fax: [phone]

## 2017-05-30 NOTE — ED Provider Notes (Signed)
[facility] DEPT Provider Note   CSN: [id number] Arrival date & time: 05/30/17  1217     History   Chief Complaint Chief Complaint  Patient presents with  . Chest Pain    HPI Mary [name] is a 56 y.o. female with a past medical history significant for Stage III adenocarcinoma of the colon, HTN, migraines, GERD who presents today complaining of chest pain. Patient reports substernal chest pain that started this morning. Chest pain was preceded by a left sided headache for which she took topomax. Patient also endorses some dizziness with her headache. 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 Patient took her blood pressure which was elevated 159/110, with a repeat of 171/117. Patient reports that pain was may 9/10. She tried to go to her car to get some rest, when she returned her pain worsened as she was working with patients and the more active she was the more she felt. She was give to baby aspirin by her coworker and she came to the ED. Patient denies any shortness of breath, diaphoresis, abdominal pain, nausea, vomiting, radiation to her arm shoulder or jaw, numbness or tingling. Headache has resolved and chest pain has improved to 4/10 since admission to the ED.  HPI  Past Medical History:  Diagnosis Date  . Adenocarcinoma sigmoid colon 05/12/11   Stage III(pT2pN2b) descending sigmoid  . Arm DVT (deep venous thromboembolism), acute ([place]) 07/04/11   r/t PAC  . Blood transfusion   . Cancer of descending colon ([place]) 05/12/2011  . GERD (gastroesophageal reflux disease)   . History of migraine headaches   . Incisional hernia   . iron deficiency anemia   . Multiple hemangiomas    Liver  . 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 w/prolonged cold sensitivity--Oxaliplatin related  . Pneumothorax 07/19/11   Bilateral  . Renal cysts, acquired, bilateral     Patient Active Problem List   Diagnosis Date Noted  . Chest pain 05/30/2017  . Chronic nonintractable headache 05/04/2017  . Ankle edema, bilateral 05/04/2017  . Cellulitis 04/04/2016  . Abnormal x-ray 04/04/2016  . Hearing loss due to cerumen impaction 01/14/2016  . Impaired fasting blood sugar 01/14/2016  . Plantar fasciitis, right 11/22/2015  . Right calcaneal fracture 08/15/2015  . OSA (obstructive sleep apnea) 09/26/2014  . Peripheral neuropathy due to chemotherapy ([place]) 06/05/2014  . Obesity 06/05/2014  . history of DVT (deep venous thrombosis) 02/20/2014  . GERD (gastroesophageal reflux disease) 02/20/2014  . Hernia 09/22/2012  . Iron deficiency anemia, unspecified 08/30/2011  . Colon cancer ([place]) 08/29/2011  . Cancer of descending colon ([place]) 05/12/2011    Past Surgical History:  Procedure Laterality Date  . ABDOMINAL HYSTERECTOMY    . COLON SURGERY  05/17/11   Partial colectomy  . HERNIA REPAIR  09/06/12   lap incisional hernia  . INSERTION OF MESH  09/06/2012   Procedure: INSERTION OF MESH;  Surgeon: Gwenyth Ober, MD;  Location: [facility];  Service: General;  Laterality: N/A;  . PORT-A-CATH REMOVAL  05/06/2012   Procedure: MINOR REMOVAL PORT-A-CATH;  Surgeon: Gwenyth Ober, MD;  Location: [facility];  Service: General;  Laterality: Right;  . PORTACATH PLACEMENT  07/20/11   Right side placed  . PORTACATH PLACEMENT     left side & right side  . VENTRAL HERNIA REPAIR  09/06/2012   Procedure: LAPAROSCOPIC VENTRAL HERNIA;  Surgeon: Kathryne Eriksson  Hulen Skains, MD;  Location: [facility];  Service: General;  Laterality: N/A;    OB History    No data available       Home Medications    Prior to Admission medications   Medication Sig Start Date End Date Taking? Authorizing Provider  Elastic Bandages & Supports (MEDICAL COMPRESSION STOCKINGS) MISC 1 Units by Does not apply route daily. Knee high, moderate compression 05/04/17  Yes Nche, Charlene Brooke, NP  furosemide (LASIX) 20 MG tablet Take 0.5 tablets (10  mg total) by mouth daily as needed. 05/04/17  Yes Nche, Charlene Brooke, NP  hydroxypropyl methylcellulose (ISOPTO TEARS) 2.5 % ophthalmic solution Place 1 drop into both eyes daily as needed for dry eyes.   Yes [provider]  ibuprofen (ADVIL,MOTRIN) 800 MG tablet Take 1 tablet (800 mg total) by mouth every 8 (eight) hours as needed. 02/26/17  Yes Lyndal Pulley, DO  topiramate (TOPAMAX) 100 MG tablet Take 1 tablet (100 mg total) by mouth at bedtime as needed. Reported on 04/03/2016 05/04/17  Yes Nche, Charlene Brooke, NP  polyethylene glycol (MIRALAX / GLYCOLAX) packet Take 17 g by mouth daily. Patient not taking: Reported on 05/30/2017 02/27/14   Verlee Monte, MD    Family History Family History  Problem Relation Age of Onset  . Diabetes Mother   . Hypertension Mother   . Cancer Father        leukemia  . Cancer Maternal Uncle        lung  . Cancer Maternal Uncle        colon  . Cancer Maternal Uncle        prostate  . Cancer Brother        Deceased  . Other Brother        MVA  . Healthy Sister   . Healthy Daughter        x 3    Social History Social History  Substance Use Topics  . Smoking status: Never Smoker  . Smokeless tobacco: Never Used  . Alcohol use No     Allergies   Patient has no known allergies.   Review of Systems Review of Systems  Constitutional: Negative.   HENT: Negative.   Eyes: Negative.   Respiratory: Negative.   Cardiovascular: Positive for chest pain.  Gastrointestinal: Negative.   Endocrine: Negative.   Genitourinary: Negative.   Musculoskeletal: Negative.   Skin: Negative.   Allergic/Immunologic: Negative.   Neurological: Negative.   Hematological: Negative.   Psychiatric/Behavioral: Negative.      Physical Exam Updated Vital Signs BP (!) 164/79   Pulse 69   Temp 98.7 F (37.1 C) (Oral)   Resp 14   Ht 5\' 11"  (1.803 m)   Wt 104.3 kg (230 lb)   SpO2 100%   BMI 32.08 kg/m   Physical Exam General: NAD, pleasant,  able to participate in exam Cardiac: RRR, normal heart sounds, no murmurs. 2+ radial and PT pulses bilaterally, substernal chest pain non reproducible on palpation Respiratory: CTAB, normal effort, No wheezes, rales or rhonchi Abdomen: soft, nontender, nondistended, no hepatic or splenomegaly, +BS Extremities: no edema or cyanosis. WWP. Skin: warm and dry, no rashes noted Neuro: alert and oriented x4, no focal deficits Psych: Normal affect and mood  ED Treatments / Results  Labs (all labs ordered are listed, but only abnormal results are displayed) Labs Reviewed  BASIC METABOLIC PANEL - Abnormal; Notable for the following:       Result Value   Glucose,  Bld 109 (*)    Creatinine, Ser 1.13 (*)    GFR calc non Af Amer 54 (*)    All other components within normal limits  CBC - Abnormal; Notable for the following:    RBC 5.36 (*)    MCV 67.5 (*)    MCH 22.6 (*)    RDW 16.3 (*)    All other components within normal limits  D-DIMER, QUANTITATIVE (NOT AT [facility])  HIV ANTIBODY (ROUTINE TESTING)  CREATININE, SERUM  TROPONIN I  TROPONIN I  TROPONIN I  I-STAT TROPONIN, ED  POCT I-STAT TROPONIN I    EKG  EKG Interpretation  Date/Time:  Saturday May 30 2017 12:25:36 EDT Ventricular Rate:  75 PR Interval:    QRS Duration: 85 QT Interval:  394 QTC Calculation: 441 R Axis:   51 Text Interpretation:  Sinus rhythm Low voltage, precordial leads Nonspecific TW changes, lead III new from prior, no other significant changes Confirmed by Gareth Morgan [phone]) on 05/30/2017 1:01:13 PM       Radiology Dg Chest 2 View  Result Date: 05/30/2017 CLINICAL DATA:  Acute presentation with central chest pain. Dizziness and hypertension. EXAM: CHEST  2 VIEW COMPARISON:  05/25/2016 FINDINGS: Artifact overlies the chest. Heart size is normal. Mediastinal shadows are normal. No aortic calcifications seen. The pulmonary vascularity is normal. The lungs are clear. No effusions. Ordinary thoracic  osteophytes from. No significant bone finding. IMPRESSION: Normal chest Electronically Signed   By: Nelson Chimes M.D.   On: 05/30/2017 13:05    Procedures Procedures (including critical care time)  Medications Ordered in ED Medications  nitroGLYCERIN (NITROSTAT) SL tablet 0.4 mg (0.4 mg Sublingual Not Given 05/30/17 1440)  enoxaparin (LOVENOX) injection 40 mg (not administered)  0.9 %  sodium chloride infusion ( Intravenous New Bag/Given 05/30/17 1616)  acetaminophen (TYLENOL) tablet 650 mg (not administered)    Or  acetaminophen (TYLENOL) suppository 650 mg (not administered)  ondansetron (ZOFRAN) tablet 4 mg (not administered)    Or  ondansetron (ZOFRAN) injection 4 mg (not administered)  aspirin EC tablet 81 mg (0 mg Oral Hold 05/30/17 1610)  levalbuterol (XOPENEX) nebulizer solution 0.63 mg (not administered)  pantoprazole (PROTONIX) EC tablet 40 mg (not administered)  topiramate (TOPAMAX) tablet 100 mg (not administered)  aspirin chewable tablet 162 mg (162 mg Oral Given 05/30/17 1438)     Initial Impression / Assessment and Plan / ED Course  Patient is 56 yo female who presents with substernal chest pain since this morning worse with exertion.  Patient denies an other associated symptoms like radiation to arm, neck, or jaw, no dyspnea or diaphoresis. EKG shows non specific T waves inversion in lead III. Trop <0.03. Patient vitals are stable no major risk factor but HEART score is 4. Given presentation, symptoms, EKG changes and worsening substernal chest pain on exertion, chest pain is highly suspicious for cardiac etiology. Patient would benefit from further cardiac workup.    I have reviewed the triage vital signs and the nursing notes.  Pertinent labs & imaging results that were available during my care of the patient were reviewed by me and considered in my medical decision making (see chart for details).     Final Clinical Impressions(s) / ED Diagnoses   Final diagnoses:    Chest pain, unspecified type    New Prescriptions New Prescriptions   No medications on file     Marjie Skiff, MD 05/30/17 1287    Gareth Morgan, MD 06/01/17 [phone]

## 2017-05-30 NOTE — H&P (Signed)
[facility] History and Physical  Mary [name] YCX:[id number] DOB: [birth year] DOA: 05/30/2017  Referring physician: ER  PCP: Hoyt Koch, MD   Chief Complaint: Chest pain  HPI:   56 year old female with a history of hypertension, gastroesophageal reflux disease, migraine headaches, remote history of colon cancer 5 years ago and right upper extremity DVT, who presents to the ER with multiple complaints. Patient works in the dialysis unit, woke up at 4 AM this morning, with a migraine headache for which she takes Topamax. She went to work and started feeling lightheaded and also started having chest pain related to lifting gallons of dialysate fluid containers. Her BP was 180  at around 10 AM this morning. Patient states that she took 800 mg of ibuprofen, due to persistent migraine headache. She started having chest pain at the same time as her migraine headache, and noticed that her chest pain got relieved after she rested some. She drove herself to the ER. She also had some diaphoresis, no fever no cough no   shortness of breath. ED course BP (!) 164/79   Pulse 69   Temp 98.7 F (37.1 C) (Oral)   Resp 14   Chest x-ray negative, EKG shows non specific T waves inversion in lead III. Trop <0.03. Patient vitals are stable no major risk factor but HEART score is 4. Given presentation, symptoms, EKG changes and worsening substernal chest pain on exertion, chest pain is highly suspicious for cardiac etiology. Patient admitted for chest pain rule out    Review of Systems: negative for the following  Constitutional: Denies fever, chills, diaphoresis, appetite change and fatigue.  HEENT: Denies photophobia, eye pain, redness, hearing loss, ear pain, congestion, sore throat, rhinorrhea, sneezing, mouth sores, trouble swallowing, neck pain, neck stiffness and tinnitus.  Respiratory: Denies SOB, DOE, cough, chest tightness, and wheezing.  Cardiovascular: Positive for chest pain,    Gastrointestinal: Denies nausea, vomiting, abdominal pain, diarrhea, constipation, blood in stool and abdominal distention.  Genitourinary: Denies dysuria, urgency, frequency, hematuria, flank pain and difficulty urinating.  Musculoskeletal: Denies myalgias, back pain, joint swelling, arthralgias and gait problem.  Skin: Denies pallor, rash and wound.  Neurological: Denies dizziness, seizures, syncope, weakness, light-headedness, numbness and headaches.  Hematological: Denies adenopathy. Easy bruising, personal or family bleeding history  Psychiatric/Behavioral: Denies suicidal ideation, mood changes, confusion, nervousness, sleep disturbance and agitation       Past Medical History:  Diagnosis Date  . Adenocarcinoma sigmoid colon 05/12/11   Stage III(pT2pN2b) descending sigmoid  . Arm DVT (deep venous thromboembolism), acute ([place]) 07/04/11   r/t PAC  . Blood transfusion   . Cancer of descending colon ([place]) 05/12/2011  . GERD (gastroesophageal reflux disease)   . History of migraine headaches   . Incisional hernia   . iron deficiency anemia   . Multiple hemangiomas    Liver  . 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 w/prolonged cold sensitivity--Oxaliplatin related  . Pneumothorax 07/19/11   Bilateral  . Renal cysts, acquired, bilateral      Past Surgical History:  Procedure Laterality Date  . ABDOMINAL HYSTERECTOMY    . COLON SURGERY  05/17/11   Partial colectomy  . HERNIA REPAIR  09/06/12   lap incisional hernia  . INSERTION OF MESH  09/06/2012   Procedure: INSERTION OF MESH;  Surgeon: Gwenyth Ober, MD;  Location: [facility];  Service: General;  Laterality: N/A;  . PORT-A-CATH REMOVAL  05/06/2012   Procedure: MINOR REMOVAL PORT-A-CATH;  Surgeon: Jeneen Rinks  Michael Boston, MD;  Location: [facility];  Service: General;  Laterality: Right;  . PORTACATH PLACEMENT  07/20/11   Right side placed  . PORTACATH PLACEMENT     left side & right side  . VENTRAL HERNIA REPAIR   09/06/2012   Procedure: LAPAROSCOPIC VENTRAL HERNIA;  Surgeon: Gwenyth Ober, MD;  Location: [facility];  Service: General;  Laterality: N/A;      Social History:  reports that she has never smoked. She has never used smokeless tobacco. She reports that she does not drink alcohol or use drugs.    No Known Allergies  Family History  Problem Relation Age of Onset  . Diabetes Mother   . Hypertension Mother   . Cancer Father        leukemia  . Cancer Maternal Uncle        lung  . Cancer Maternal Uncle        colon  . Cancer Maternal Uncle        prostate  . Cancer Brother        Deceased  . Other Brother        MVA  . Healthy Sister   . Healthy Daughter        x 3        Prior to Admission medications   Medication Sig Start Date End Date Taking? Authorizing Provider  Elastic Bandages & Supports (MEDICAL COMPRESSION STOCKINGS) MISC 1 Units by Does not apply route daily. Knee high, moderate compression 05/04/17  Yes Nche, Charlene Brooke, NP  furosemide (LASIX) 20 MG tablet Take 0.5 tablets (10 mg total) by mouth daily as needed. 05/04/17  Yes Nche, Charlene Brooke, NP  hydroxypropyl methylcellulose (ISOPTO TEARS) 2.5 % ophthalmic solution Place 1 drop into both eyes daily as needed for dry eyes.   Yes [provider]  ibuprofen (ADVIL,MOTRIN) 800 MG tablet Take 1 tablet (800 mg total) by mouth every 8 (eight) hours as needed. 02/26/17  Yes Lyndal Pulley, DO  topiramate (TOPAMAX) 100 MG tablet Take 1 tablet (100 mg total) by mouth at bedtime as needed. Reported on 04/03/2016 05/04/17  Yes Nche, Charlene Brooke, NP  polyethylene glycol (MIRALAX / GLYCOLAX) packet Take 17 g by mouth daily. Patient not taking: Reported on 05/30/2017 02/27/14   Verlee Monte, MD     Physical Exam: Vitals:   05/30/17 1233 05/30/17 1309 05/30/17 1400 05/30/17 1442  BP: (!) 133/96 132/78  (!) 142/77  Pulse: 74 75 78 68  Resp: 18 14 11 14   Temp: 98.7 F (37.1 C)     TempSrc: Oral     SpO2: 100% 97%  98% 98%  Weight:      Height:            Vitals:   05/30/17 1233 05/30/17 1309 05/30/17 1400 05/30/17 1442  BP: (!) 133/96 132/78  (!) 142/77  Pulse: 74 75 78 68  Resp: 18 14 11 14   Temp: 98.7 F (37.1 C)     TempSrc: Oral     SpO2: 100% 97% 98% 98%  Weight:      Height:       Constitutional: NAD, calm, comfortable Eyes: PERRL, lids and conjunctivae normal ENMT: Mucous membranes are moist. Posterior pharynx clear of any exudate or lesions.Normal dentition.  Neck: normal, supple, no masses, no thyromegaly Respiratory: clear to auscultation bilaterally, no wheezing, no crackles. Normal respiratory effort. No accessory muscle use.  Cardiovascular: Regular rate and rhythm, no murmurs /  rubs / gallops. No extremity edema. 2+ pedal pulses. No carotid bruits.  Abdomen: no tenderness, no masses palpated. No hepatosplenomegaly. Bowel sounds positive.  Musculoskeletal: no clubbing / cyanosis. No joint deformity upper and lower extremities. Good ROM, no contractures. Normal muscle tone.  Skin: no rashes, lesions, ulcers. No induration Neurologic: CN 2-12 grossly intact. Sensation intact, DTR normal. Strength 5/5 in all 4.  Psychiatric: Normal judgment and insight. Alert and oriented x 3. Normal mood.     Labs on Admission: I have personally reviewed following labs and imaging studies  CBC:  Recent Labs Lab 05/30/17 1304  WBC 6.0  HGB 12.1  HCT 36.2  MCV 67.5*  PLT 585    Basic Metabolic Panel:  Recent Labs Lab 05/30/17 1304  NA 139  K 3.7  CL 106  CO2 24  GLUCOSE 109*  BUN 17  CREATININE 1.13*  CALCIUM 9.1    GFR: Estimated Creatinine Clearance: 74.8 mL/min (A) (by C-G formula based on SCr of 1.13 mg/dL (H)).  Liver Function Tests: No results for input(s): AST, ALT, ALKPHOS, BILITOT, PROT, ALBUMIN in the last 168 hours. No results for input(s): LIPASE, AMYLASE in the last 168 hours. No results for input(s): AMMONIA in the last 168 hours.  Coagulation  Profile: No results for input(s): INR, PROTIME in the last 168 hours. No results for input(s): DDIMER in the last 72 hours.  Cardiac Enzymes: No results for input(s): CKTOTAL, CKMB, CKMBINDEX, TROPONINI in the last 168 hours.  BNP (last 3 results) No results for input(s): PROBNP in the last 8760 hours.  HbA1C: No results for input(s): HGBA1C in the last 72 hours. Lab Results  Component Value Date   HGBA1C 6.7 (H) 01/14/2016   HGBA1C 6.6 (H) 09/14/2014     CBG: No results for input(s): GLUCAP in the last 168 hours.  Lipid Profile: No results for input(s): CHOL, HDL, LDLCALC, TRIG, CHOLHDL, LDLDIRECT in the last 72 hours.  Thyroid Function Tests: No results for input(s): TSH, T4TOTAL, FREET4, T3FREE, THYROIDAB in the last 72 hours.  Anemia Panel: No results for input(s): VITAMINB12, FOLATE, FERRITIN, TIBC, IRON, RETICCTPCT in the last 72 hours.  Urine analysis:    Component Value Date/Time   LABSPEC 1.025 05/11/2015 1518   PHURINE 6.5 05/11/2015 1518   GLUCOSEU NEGATIVE 05/11/2015 1518   HGBUR NEGATIVE 05/11/2015 1518   BILIRUBINUR negative 01/21/2017 1701   KETONESUR negative 01/21/2017 1701   KETONESUR NEGATIVE 05/11/2015 1518   PROTEINUR negative 01/21/2017 1701   PROTEINUR NEGATIVE 05/11/2015 1518   UROBILINOGEN 0.2 01/21/2017 1701   UROBILINOGEN 0.2 05/11/2015 1518   NITRITE Negative 01/21/2017 1701   NITRITE NEGATIVE 05/11/2015 1518   LEUKOCYTESUR Negative 01/21/2017 1701    Sepsis Labs: @LABRCNTIP (procalcitonin:4,lacticidven:4) )No results found for this or any previous visit (from the past 240 hour(s)).       Radiological Exams on Admission: Dg Chest 2 View  Result Date: 05/30/2017 CLINICAL DATA:  Acute presentation with central chest pain. Dizziness and hypertension. EXAM: CHEST  2 VIEW COMPARISON:  05/25/2016 FINDINGS: Artifact overlies the chest. Heart size is normal. Mediastinal shadows are normal. No aortic calcifications seen. The pulmonary  vascularity is normal. The lungs are clear. No effusions. Ordinary thoracic osteophytes from. No significant bone finding. IMPRESSION: Normal chest Electronically Signed   By: Nelson Chimes M.D.   On: 05/30/2017 13:05   Dg Chest 2 View  Result Date: 05/30/2017 CLINICAL DATA:  Acute presentation with central chest pain. Dizziness and hypertension. EXAM: CHEST  2  VIEW COMPARISON:  05/25/2016 FINDINGS: Artifact overlies the chest. Heart size is normal. Mediastinal shadows are normal. No aortic calcifications seen. The pulmonary vascularity is normal. The lungs are clear. No effusions. Ordinary thoracic osteophytes from. No significant bone finding. IMPRESSION: Normal chest Electronically Signed   By: Nelson Chimes M.D.   On: 05/30/2017 13:05      EKG: Independently reviewed. Slight T-wave inversion in the anterolateral lead  Assessment/Plan Chest pain, atypical, Patient will be admitted to telemetry Heart score 4 Cycle cardiac enzymes, negative 1 Echo to rule out wall motion abnormalities Lexi scan Myoview in a.m. Discussed with Dr. Cathie Olden, if [facility] positive  consult cardiology Patient started on aspirin    Colon cancer (HCC)-in remission Followed by Dr.Sherril    history of DVT (deep venous thrombosis)    GERD (gastroesophageal reflux disease)-continue PPI     Obesity- Body mass index is 32.08 kg/m.   Hypertensive urgency Blood pressure elevated, continue Norvasc Prn hydralazie as needed   DVT prophylaxis: *Lovenox     Code Status Orders Full code         consults called: cardiology  Family Communication: Admission, patients condition and plan of care including tests being ordered have been discussed with the patient  who indicates understanding and agree with the plan and Code Status  Admission status: Observation   Disposition plan: Further plan will depend as patient's clinical course evolves and further radiologic and laboratory data become available. Likely  home when stable     Reyne Dumas MD [facility] Pager [phone]  If 7PM-7AM, please contact night-coverage [email or web address] Password [facility]  05/30/2017, 4:14 PM

## 2017-05-30 NOTE — Progress Notes (Signed)
Pt c/o chest pain increasing from 4 to 5. BP 141/70. HR NSR in the 70s. Dr. Myna Hidalgo notified and awaiting orders. Will continue to monitor pt.

## 2017-05-30 NOTE — ED Triage Notes (Signed)
Pt woke up this morning with headache.  Pt went to work.  Pt states at work she started feeling diaphoretic and chest pain

## 2017-05-31 ENCOUNTER — Observation Stay (HOSPITAL_BASED_OUTPATIENT_CLINIC_OR_DEPARTMENT_OTHER)
Admit: 2017-05-31 | Discharge: 2017-05-31 | Disposition: A | Discharge status: Home / Self Care | Payer: 59 | Attending: Internal Medicine | Admitting: Internal Medicine

## 2017-05-31 ENCOUNTER — Observation Stay (HOSPITAL_BASED_OUTPATIENT_CLINIC_OR_DEPARTMENT_OTHER): Payer: 59

## 2017-05-31 DIAGNOSIS — R079 Chest pain, unspecified: Secondary | ICD-10-CM

## 2017-05-31 DIAGNOSIS — I340 Nonrheumatic mitral (valve) insufficiency: Secondary | ICD-10-CM

## 2017-05-31 LAB — NM MYOCAR MULTI W/SPECT W/WALL MOTION / EF
Estimated workload: 1 METS
Exercise duration (min): 5 min
MPHR: 165 {beats}/min
Peak HR: 90 {beats}/min
Percent HR: 54 %
Rest HR: 52 {beats}/min

## 2017-05-31 LAB — RETICULOCYTES
RBC.: 5.46 MIL/uL — ABNORMAL HIGH (ref 3.87–5.11)
Retic Count, Absolute: 38.2 10*3/uL (ref 19.0–186.0)
Retic Ct Pct: 0.7 % (ref 0.4–3.1)

## 2017-05-31 LAB — IRON AND TIBC
Iron: 72 ug/dL (ref 28–170)
Saturation Ratios: 22 % (ref 10.4–31.8)
TIBC: 328 ug/dL (ref 250–450)
UIBC: 256 ug/dL

## 2017-05-31 LAB — VITAMIN B12: Vitamin B-12: 198 pg/mL (ref 180–914)

## 2017-05-31 LAB — TROPONIN I: Troponin I: <0.03 ng/mL (ref <0.03)

## 2017-05-31 LAB — FOLATE: Folate: 10.5 ng/mL (ref >5.9)

## 2017-05-31 LAB — ECHOCARDIOGRAM COMPLETE
Height: 71 in
Weight: 3680 oz

## 2017-05-31 LAB — FERRITIN: Ferritin: 62 ng/mL (ref 11–307)

## 2017-05-31 MED ORDER — TECHNETIUM TC 99M TETROFOSMIN IV KIT
30.0000 | PACK | Freq: Once | INTRAVENOUS | Status: AC | PRN
  Administered 2017-05-31: 30 via INTRAVENOUS

## 2017-05-31 MED ORDER — REGADENOSON 0.4 MG/5ML IV SOLN
INTRAVENOUS | Status: AC
  Filled 2017-05-31: qty 5

## 2017-05-31 MED ORDER — TECHNETIUM TC 99M TETROFOSMIN IV KIT
10.0000 | PACK | Freq: Once | INTRAVENOUS | Status: AC | PRN
  Administered 2017-05-31: 10 via INTRAVENOUS

## 2017-05-31 MED ORDER — REGADENOSON 0.4 MG/5ML IV SOLN
0.4000 mg | Freq: Once | INTRAVENOUS | Status: AC
  Administered 2017-05-31: 0.4 mg via INTRAVENOUS

## 2017-05-31 NOTE — Progress Notes (Signed)
*  PRELIMINARY RESULTS* Echocardiogram 2D Echocardiogram has been performed.  Leavy Cella 05/31/2017, 1:09 PM

## 2017-05-31 NOTE — Progress Notes (Signed)
   Mary [name] presented for a [employer] today.  No immediate complications.  Stress imaging is pending at this time.  Reino Bellis, NP 05/31/2017, 10:33 AM

## 2017-05-31 NOTE — Discharge Summary (Signed)
Physician Discharge Summary  Mary [name] YBO:[id number] DOB: 1961-08-23 DOA: 05/30/2017  PCP: Hoyt Koch, MD  Admit date: 05/30/2017 Discharge date: 05/31/2017  Admitted From: Home  Disposition:  Home  Recommendations for Outpatient Follow-up:  1. Follow up with PCP in 1-2 weeks 2.   Home Health:None  Equipment/Devices:None  Discharge Condition:Stable CODE STATUS:Full Diet recommendation: Heart Healthy  Brief/Interim Summary:  56 year old female with a history of hypertension, gastroesophageal reflux disease, migraine headaches, remote history of colon cancer 5 years ago and right upper extremity DVT, who presents to the ER with chest pain related to lifting gallons of dialysate fluid containers  Discharge Diagnoses:  Active Problems:   Colon cancer (HCC)   history of DVT (deep venous thrombosis)   GERD (gastroesophageal reflux disease)   Obesity   OSA (obstructive sleep apnea)   Chest pain  Atypical Chest pain: Cardiac biomarkers negative x3, stress test low risk. Echo no wall motion abnormality, EF 65% with grade 1 diastolic dysfunction.  Hx of Colon cancer: Follow up with Dr, Ammie Dalton  Hx of DVT GERD Obesity Essential HTN: No changes made to his her egimen   Discharge Instructions  Discharge Instructions    Diet - low sodium heart healthy    Complete by:  As directed    Increase activity slowly    Complete by:  As directed      Allergies as of 05/31/2017   No Known Allergies     Medication List    TAKE these medications   furosemide 20 MG tablet Commonly known as:  LASIX Take 0.5 tablets (10 mg total) by mouth daily as needed.   hydroxypropyl methylcellulose / hypromellose 2.5 % ophthalmic solution Commonly known as:  ISOPTO TEARS / GONIOVISC Place 1 drop into both eyes daily as needed for dry eyes.   ibuprofen 800 MG tablet Commonly known as:  ADVIL,MOTRIN Take 1 tablet (800 mg total) by mouth every 8 (eight) hours as needed.    Medical Compression Stockings Misc 1 Units by Does not apply route daily. Knee high, moderate compression   polyethylene glycol packet Commonly known as:  MIRALAX / GLYCOLAX Take 17 g by mouth daily.   topiramate 100 MG tablet Commonly known as:  TOPAMAX Take 1 tablet (100 mg total) by mouth at bedtime as needed. Reported on 04/03/2016       No Known Allergies  Consultations:  None   Procedures/Studies: Dg Chest 2 View  Result Date: 05/30/2017 CLINICAL DATA:  Acute presentation with central chest pain. Dizziness and hypertension. EXAM: CHEST  2 VIEW COMPARISON:  05/25/2016 FINDINGS: Artifact overlies the chest. Heart size is normal. Mediastinal shadows are normal. No aortic calcifications seen. The pulmonary vascularity is normal. The lungs are clear. No effusions. Ordinary thoracic osteophytes from. No significant bone finding. IMPRESSION: Normal chest Electronically Signed   By: Nelson Chimes M.D.   On: 05/30/2017 13:05   Nm Myocar Multi W/spect W/wall Motion / Ef  Result Date: 05/31/2017  There was no ST segment deviation noted during stress.  No T wave inversion was noted during stress.  There is a small defect of mild severity present in the apical anterior and apex location. The defect is non-reversible and consistent with breast attenuation artifact. No ischemia noted.  This is a low risk study.  The left ventricular ejection fraction is hyperdynamic (>65%).  Nuclear stress EF: 67%.     ECHO  Myioview   Subjective: No complains  Discharge Exam: Vitals:   05/31/17 0451  05/31/17 1152  BP: 136/90 135/88  Pulse: (!) 54   Resp: 16   Temp: 97.8 F (36.6 C)    Vitals:   05/30/17 2017 05/30/17 2222 05/31/17 0451 05/31/17 1152  BP: (!) 141/70 121/71 136/90 135/88  Pulse: 68 77 (!) 54   Resp: 18  16   Temp: 98.2 F (36.8 C)  97.8 F (36.6 C)   TempSrc: Oral  Oral   SpO2: 99%  99%   Weight:      Height:        General: Pt is alert, awake, not in acute  distress Cardiovascular: RRR, S1/S2 +, no rubs, no gallops Respiratory: CTA bilaterally, no wheezing, no rhonchi Abdominal: Soft, NT, ND, bowel sounds + Extremities: no edema, no cyanosis    The results of significant diagnostics from this hospitalization (including imaging, microbiology, ancillary and laboratory) are listed below for reference.     Microbiology: No results found for this or any previous visit (from the past 240 hour(s)).   Labs: BNP (last 3 results) No results for input(s): BNP in the last 8760 hours. Basic Metabolic Panel:  Recent Labs Lab 05/30/17 1304 05/30/17 1609  NA 139  --   K 3.7  --   CL 106  --   CO2 24  --   GLUCOSE 109*  --   BUN 17  --   CREATININE 1.13* 1.06*  CALCIUM 9.1  --    Liver Function Tests: No results for input(s): AST, ALT, ALKPHOS, BILITOT, PROT, ALBUMIN in the last 168 hours. No results for input(s): LIPASE, AMYLASE in the last 168 hours. No results for input(s): AMMONIA in the last 168 hours. CBC:  Recent Labs Lab 05/30/17 1304  WBC 6.0  HGB 12.1  HCT 36.2  MCV 67.5*  PLT 168   Cardiac Enzymes:  Recent Labs Lab 05/30/17 1609 05/30/17 2142 05/31/17 0452  TROPONINI <0.03 <0.03 <0.03   BNP: Invalid input(s): POCBNP CBG: No results for input(s): GLUCAP in the last 168 hours. D-Dimer  Recent Labs  05/30/17 1609  DDIMER 0.32   Hgb A1c No results for input(s): HGBA1C in the last 72 hours. Lipid Profile No results for input(s): CHOL, HDL, LDLCALC, TRIG, CHOLHDL, LDLDIRECT in the last 72 hours. Thyroid function studies No results for input(s): TSH, T4TOTAL, T3FREE, THYROIDAB in the last 72 hours.  Invalid input(s): FREET3 Anemia work up No results for input(s): VITAMINB12, FOLATE, FERRITIN, TIBC, IRON, RETICCTPCT in the last 72 hours. Urinalysis    Component Value Date/Time   LABSPEC 1.025 05/11/2015 1518   PHURINE 6.5 05/11/2015 1518   GLUCOSEU NEGATIVE 05/11/2015 1518   HGBUR NEGATIVE 05/11/2015  1518   BILIRUBINUR negative 01/21/2017 1701   KETONESUR negative 01/21/2017 1701   KETONESUR NEGATIVE 05/11/2015 1518   PROTEINUR negative 01/21/2017 1701   PROTEINUR NEGATIVE 05/11/2015 1518   UROBILINOGEN 0.2 01/21/2017 1701   UROBILINOGEN 0.2 05/11/2015 1518   NITRITE Negative 01/21/2017 1701   NITRITE NEGATIVE 05/11/2015 1518   LEUKOCYTESUR Negative 01/21/2017 1701   Sepsis Labs Invalid input(s): PROCALCITONIN,  WBC,  LACTICIDVEN Microbiology No results found for this or any previous visit (from the past 240 hour(s)).   Time coordinating discharge: Over 30 minutes  SIGNED:   Charlynne Cousins, MD  [facility] 05/31/2017, 2:25 PM Pager   If 7PM-7AM, please contact night-coverage [email or web address] Password [email or web address]

## 2017-06-10 LAB — HIV ANTIBODY (ROUTINE TESTING W REFLEX): HIV Screen 4th Generation wRfx: NONREACTIVE

## 2017-06-29 ENCOUNTER — Ambulatory Visit: Payer: BLUE CROSS/BLUE SHIELD | Admitting: Nurse Practitioner

## 2017-06-29 ENCOUNTER — Other Ambulatory Visit: Payer: BLUE CROSS/BLUE SHIELD

## 2017-07-03 ENCOUNTER — Other Ambulatory Visit: Payer: Self-pay | Admitting: *Deleted

## 2017-07-03 DIAGNOSIS — C186 Malignant neoplasm of descending colon: Secondary | ICD-10-CM

## 2017-07-06 ENCOUNTER — Telehealth: Payer: Self-pay | Admitting: Nurse Practitioner

## 2017-07-06 ENCOUNTER — Ambulatory Visit (HOSPITAL_BASED_OUTPATIENT_CLINIC_OR_DEPARTMENT_OTHER): Payer: 59 | Admitting: Nurse Practitioner

## 2017-07-06 ENCOUNTER — Other Ambulatory Visit (HOSPITAL_BASED_OUTPATIENT_CLINIC_OR_DEPARTMENT_OTHER): Payer: 59

## 2017-07-06 VITALS — BP 140/87 | HR 70 | Temp 98.5°F | Resp 18 | Wt 248.2 lb

## 2017-07-06 DIAGNOSIS — C186 Malignant neoplasm of descending colon: Secondary | ICD-10-CM

## 2017-07-06 DIAGNOSIS — Z85038 Personal history of other malignant neoplasm of large intestine: Secondary | ICD-10-CM

## 2017-07-06 LAB — CEA (IN HOUSE-CHCC): CEA (CHCC-In House): 1.37 ng/mL (ref 0.00–5.00)

## 2017-07-06 NOTE — Telephone Encounter (Signed)
Scheduled appt per 8/27 los - Gave patient AVS and calender per los. Lab and f/u in one year.
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
## 2017-07-07 ENCOUNTER — Telehealth: Payer: Self-pay | Admitting: *Deleted

## 2017-07-07 LAB — CEA: CEA: 2.3 ng/mL (ref 0.0–4.7)

## 2017-07-07 NOTE — Telephone Encounter (Signed)
Telephone call to patient, left message for return call to advise lab results as directed below.

## 2017-07-07 NOTE — Telephone Encounter (Signed)
-----   Message from Owens Shark, NP sent at 07/07/2017  8:32 AM EDT ----- Please let her know CEA is normal.

## 2017-07-07 NOTE — Telephone Encounter (Signed)
Return call received.  Provided nurse information as indicated below.  "Okay, that's all I needed."

## 2017-12-02 ENCOUNTER — Other Ambulatory Visit: Payer: Self-pay | Admitting: Nurse Practitioner

## 2017-12-02 DIAGNOSIS — M25472 Effusion, left ankle: Principal | ICD-10-CM

## 2017-12-02 DIAGNOSIS — M25471 Effusion, right ankle: Secondary | ICD-10-CM

## 2018-03-05 ENCOUNTER — Other Ambulatory Visit (INDEPENDENT_AMBULATORY_CARE_PROVIDER_SITE_OTHER): Payer: 59

## 2018-03-05 ENCOUNTER — Ambulatory Visit: Payer: 59 | Admitting: Internal Medicine

## 2018-03-05 ENCOUNTER — Encounter: Payer: Self-pay | Admitting: Internal Medicine

## 2018-03-05 VITALS — BP 152/88 | HR 85 | Temp 98.0°F | Ht 71.0 in | Wt 240.0 lb

## 2018-03-05 DIAGNOSIS — Z0000 Encounter for general adult medical examination without abnormal findings: Secondary | ICD-10-CM

## 2018-03-05 DIAGNOSIS — N907 Vulvar cyst: Secondary | ICD-10-CM | POA: Insufficient documentation

## 2018-03-05 LAB — LIPID PANEL
Cholesterol: 180 mg/dL (ref 0–200)
HDL: 51.8 mg/dL
LDL Cholesterol: 108 mg/dL — ABNORMAL HIGH (ref 0–99)
NonHDL: 127.87
Total CHOL/HDL Ratio: 3
Triglycerides: 99 mg/dL (ref 0.0–149.0)
VLDL: 19.8 mg/dL (ref 0.0–40.0)

## 2018-03-05 LAB — T4, FREE: Free T4: 0.74 ng/dL (ref 0.60–1.60)

## 2018-03-05 LAB — TSH: TSH: 1.04 u[IU]/mL (ref 0.35–4.50)

## 2018-03-05 LAB — COMPREHENSIVE METABOLIC PANEL
ALT: 14 U/L (ref 0–35)
AST: 12 U/L (ref 0–37)
Albumin: 4.1 g/dL (ref 3.5–5.2)
Alkaline Phosphatase: 100 U/L (ref 39–117)
BUN: 23 mg/dL (ref 6–23)
CO2: 30 mEq/L (ref 19–32)
Calcium: 9.5 mg/dL (ref 8.4–10.5)
Chloride: 106 mEq/L (ref 96–112)
Creatinine, Ser: 1.27 mg/dL — ABNORMAL HIGH (ref 0.40–1.20)
GFR: 55.84 mL/min — ABNORMAL LOW (ref >60.00)
Glucose, Bld: 92 mg/dL (ref 70–99)
Potassium: 4 mEq/L (ref 3.5–5.1)
Sodium: 142 mEq/L (ref 135–145)
Total Bilirubin: 0.3 mg/dL (ref 0.2–1.2)
Total Protein: 7.5 g/dL (ref 6.0–8.3)

## 2018-03-05 LAB — CBC
HCT: 37.7 % (ref 36.0–46.0)
Hemoglobin: 11.8 g/dL — ABNORMAL LOW (ref 12.0–15.0)
MCHC: 31.4 g/dL (ref 30.0–36.0)
MCV: 70.1 fl — ABNORMAL LOW (ref 78.0–100.0)
Platelets: 204 K/uL (ref 150.0–400.0)
RBC: 5.37 Mil/uL — ABNORMAL HIGH (ref 3.87–5.11)
RDW: 17.5 % — ABNORMAL HIGH (ref 11.5–15.5)
WBC: 7.2 K/uL (ref 4.0–10.5)

## 2018-03-05 MED ORDER — SULFAMETHOXAZOLE-TRIMETHOPRIM 800-160 MG PO TABS: 1.0000 | ORAL_TABLET | Freq: Two times a day (BID) | ORAL | 0 refills | Status: DC

## 2018-03-05 NOTE — Progress Notes (Signed)
   Subjective:    Patient ID: Mary [name], female    DOB: March 06, 1961, 57 y.o.   MRN: [id number]  HPI The patient is a 57 YO female coming in for a cyst on the labia. She noticed about 1 month ago. There are 2 and they are mostly non-tender. Some tender to touch. Denies growth of the area. She denies discharge. Denies fevers or chills. Sometimes they are itching. She would like to get removed.   Review of Systems  Constitutional: Negative.   Respiratory: Negative for cough, chest tightness and shortness of breath.   Cardiovascular: Negative for chest pain, palpitations and leg swelling.  Gastrointestinal: Negative for abdominal distention, abdominal pain, constipation, diarrhea, nausea and vomiting.  Genitourinary:       Cyst on labia  Musculoskeletal: Negative.   Skin: Negative.   Neurological: Negative.       Objective:   Physical Exam  Constitutional: She is oriented to person, place, and time. She appears well-developed and well-nourished.  HENT:  Head: Normocephalic and atraumatic.  Eyes:  Eyes appear bulging  Neck: Normal range of motion.  Cardiovascular: Normal rate and regular rhythm.  Pulmonary/Chest: Effort normal and breath sounds normal. No respiratory distress. She has no wheezes. She has no rales.  Abdominal: Soft.  Genitourinary: Vagina normal.  Genitourinary Comments: Cyst on the right labia with firmness, some tender to palpation, no fluctuance  Musculoskeletal: She exhibits no edema.  Neurological: She is alert and oriented to person, place, and time. Coordination normal.  Skin: Skin is warm and dry.   Vitals:   03/05/18 1353  BP: (!) 152/88  Pulse: 85  Temp: 98 F (36.7 C)  TempSrc: Oral  SpO2: 99%  Weight: 240 lb (108.9 kg)  Height: 5\' 11"  (1.803 m)      Assessment & Plan:

## 2018-03-05 NOTE — Patient Instructions (Signed)
We have sent in bactrim to take 1 pill twice a day for 1 week. We will get you in with a gyn doctor to take out the cyst permanently.   We are checking the labs today.

## 2018-03-05 NOTE — Assessment & Plan Note (Addendum)
Rx for bactrim to calm down today and referral to gyn for removal of cyst as well as routine care which she has not had for some time.

## 2018-03-19 ENCOUNTER — Encounter: Payer: Self-pay | Admitting: Gynecology

## 2018-03-19 ENCOUNTER — Ambulatory Visit: Payer: 59 | Admitting: Gynecology

## 2018-03-19 VITALS — BP 124/80 | Ht 69.0 in | Wt 242.0 lb

## 2018-03-19 DIAGNOSIS — Z01411 Encounter for gynecological examination (general) (routine) with abnormal findings: Secondary | ICD-10-CM

## 2018-03-19 DIAGNOSIS — N951 Menopausal and female climacteric states: Secondary | ICD-10-CM

## 2018-03-19 DIAGNOSIS — N907 Vulvar cyst: Secondary | ICD-10-CM

## 2018-03-19 NOTE — Addendum Note (Signed)
Addended by: Nelva Nay on: 03/19/2018 10:41 AM   Modules accepted: Orders

## 2018-03-19 NOTE — Progress Notes (Signed)
    SHAKIRAH KIRKEY 1961/06/14 [id number]        57 y.o.  [id number] new patient who has not had a gynecologic exam in a number of years presents complaining of labial cysts.  She is noticed 2 areas that have been there for approximately 6 months to a year.  They are not painful or bothersome other than she notices them and would like to have them removed.  No drainage, discharge, odor or irritation.  History of "partial" hysterectomy for bleeding in her mid 57s.  No history of abnormal Pap smears previously.  History of colon cancer x5 years doing well at this time.  Past medical history,surgical history, problem list, medications, allergies, family history and social history were all reviewed and documented as reviewed in the EPIC chart.  ROS:  Performed with pertinent positives and negatives included in the history, assessment and plan.   Additional significant findings : None   Exam: Caryn Bee assistant Vitals:   03/19/18 0950  BP: 124/80  Weight: 242 lb (109.8 kg)  Height: 5\' 9"  (1.753 m)   Body mass index is 35.74 kg/m.  General appearance:  Normal affect, orientation and appearance. Skin: Grossly normal HEENT: Without gross lesions.  No cervical or supraclavicular adenopathy. Thyroid normal.  Lungs:  Clear without wheezing, rales or rhonchi Cardiac: RR, without RMG Abdominal:  Soft, nontender, without masses, guarding, rebound, organomegaly or hernia Breasts:  Examined lying and sitting without masses, retractions, discharge or axillary adenopathy. Pelvic:  Ext, BUS, Vagina: With mild atrophic changes.  2 classic appearing sebaceous cysts as outlined.  Pap smear of vaginal cuff done  Adnexa: Without masses or tenderness    Anus and perineum: Normal   Rectovaginal: Normal sphincter tone without palpated masses or tenderness.   Physical Exam  Genitourinary:         Assessment/Plan:  57 y.o. [id number] female for annual gynecologic exam.   1. 2 classic appearing sebaceous  cysts of the vulva.  They are very classic in appearance without concern for significant pathology.  I reviewed the options with the patient to include observation, incision and drainage, excision.  Benefits of incision and drainage with less invasive procedure but risk of recurrence versus excision with more extensive procedure but no recurrence risk discussed.  The patient would like to have these I&Ded and will schedule an appointment to do so. 2. Perimenopause.  Patient without history of hot flushes, sweats or vaginal dryness.  We discussed the perimenopause and she doubts that she has gone through menopause yet.  Recommend baseline FSH to see where we stand and she will have that drawn today. 3. Mammography 05/2016.  Strongly recommended patient schedule a mammogram.  Most common cancer in women reviewed.  Patient agrees to do so.  Breast exam normal today. 4. Pap smear a number of years ago.  Pap smear of vaginal cuff done today.  No history of abnormal Pap smears.  Options to stop screening per current screening guidelines based on hysterectomy history reviewed.  I would feel more comfortable with cytology for now as I do not have access to her other results. 5. History of colon cancer.  Actively being followed.  Last colonoscopy 2015. 6. Health maintenance.  Patient reports having routine lab work done elsewhere.  Follow-up for vulvar surgery otherwise follow-up in 1 year for annual exam.   Anastasio Auerbach MD, 10:08 AM 03/19/2018

## 2018-03-19 NOTE — Patient Instructions (Signed)
Follow up for vulvar cyst surgery

## 2018-03-20 LAB — FOLLICLE STIMULATING HORMONE: FSH: 38.4 m[IU]/mL

## 2018-03-22 LAB — PAP IG W/ RFLX HPV ASCU

## 2018-04-16 ENCOUNTER — Ambulatory Visit: Payer: 59 | Admitting: Gynecology

## 2018-04-26 ENCOUNTER — Ambulatory Visit: Payer: 59 | Admitting: Gynecology

## 2018-04-26 ENCOUNTER — Encounter: Payer: Self-pay | Admitting: Gynecology

## 2018-04-26 VITALS — BP 130/82

## 2018-04-26 DIAGNOSIS — N9089 Other specified noninflammatory disorders of vulva and perineum: Secondary | ICD-10-CM

## 2018-04-26 DIAGNOSIS — D280 Benign neoplasm of vulva: Secondary | ICD-10-CM | POA: Diagnosis not present

## 2018-04-26 NOTE — Progress Notes (Signed)
    Mary [name] 11-09-1961 [id number]        57 y.o.  [id number] presents having been seen for her annual exam with 2 vulvar lesions suspicious for sebaceous cysts noted.  This has bothered the patient and she would like to proceed with I&D of these 2 areas.  Past medical history,surgical history, problem list, medications, allergies, family history and social history were all reviewed and documented in the EPIC chart.  Directed ROS with pertinent positives and negatives documented in the history of present illness/assessment and plan.  Exam: Caryn Bee assistant Vitals:   04/26/18 1609  BP: 130/82   General appearance:  Normal External BUS vagina with 2 well-circumscribed mobile subcutaneous nodules consistent with sebaceous cysts as diagrammed.  Physical Exam  Genitourinary:        Procedure: The skin overlying and surrounding the 2 subcutaneous nodules was cleansed with Betadine and infiltrated with 1% lidocaine.  A stab incision into both areas was made with a scalpel and fleshy red papillary tissue extruded.  No evidence of sebaceous cyst but more of a fleshy tumor.  Both areas were excised to the level of the surrounding skin and sent to pathology separately.  Monsel solution hemostasis applied afterwards.  Postoperative instructions given to the patient.  Assessment/Plan:  57 y.o. Mary [name] with 2 fleshy small tumors of the vulva suspicious for hydradinomas.  Both areas were sent to pathology.  I reviewed the possibilities with the patient and she will follow-up for the pathology results.   Anastasio Auerbach MD, 4:34 PM 04/26/2018

## 2018-04-26 NOTE — Patient Instructions (Signed)
Office will call you with the pathology results

## 2018-04-28 LAB — TISSUE PATH REPORT

## 2018-04-28 LAB — PATHOLOGY

## 2018-05-03 ENCOUNTER — Telehealth: Payer: Self-pay | Admitting: *Deleted

## 2018-05-03 NOTE — Telephone Encounter (Signed)
Pt called asking for renewal of Handicap decal.  She states that she needs b/c she is a cancer pt & has trouble walking distances & SOB.  Please call her at [phone].  Message routed to Dr Sherrill/Pod RN

## 2018-07-06 ENCOUNTER — Inpatient Hospital Stay: Payer: 59 | Attending: Oncology | Admitting: Oncology

## 2018-07-06 ENCOUNTER — Inpatient Hospital Stay: Payer: 59

## 2018-07-20 ENCOUNTER — Telehealth: Payer: Self-pay | Admitting: Family Medicine

## 2018-07-20 NOTE — Telephone Encounter (Signed)
Copied from [place] [id number]. Topic: Appointment Scheduling - Prior Auth Required for Appointment >> Jul 20, 2018  8:52 AM Ahmed Prima L wrote: Patient wanted to know if Dr Tamala Julian could work her in for her feet. She said he treats her for her neuropathy and she is in a lot of pain. She was wondering if she could be worked in on 9/26 since that is her day off. Please advise.

## 2018-07-21 NOTE — Telephone Encounter (Signed)
Spoke to pt, scheduled her for 9/26.

## 2018-07-21 NOTE — Telephone Encounter (Signed)
lmovm for pt to return call.  

## 2018-08-04 ENCOUNTER — Other Ambulatory Visit: Payer: Self-pay | Admitting: Family Medicine

## 2018-08-04 NOTE — Telephone Encounter (Signed)
Refill denied. Pt needs an appt. 

## 2018-08-04 NOTE — Progress Notes (Signed)
Mary Mary [name] [address] [place], [place] [address] Phone: [phone] Subjective:    I [name] am serving as a Education administrator for Dr. Hulan Saas.   CC: Bilateral foot pain  [id number]  Mary [name] is a 57 y.o. female coming in with complaint of foot pain. States that her feet are doing ok today. Patient does have pes planus.  Does also have plantar fascia.  Last injection was a year and a half ago.  Started having worsening symptoms.  Does have custom orthotics.  Patient has been doing relatively well but has noticed also increasing discomfort recently.  The severity pain is 4 out of 10.      Past Medical History:  Diagnosis Date  . Adenocarcinoma sigmoid colon 05/12/11   Stage III(pT2pN2b) descending sigmoid  . Arm DVT (deep venous thromboembolism), acute ([facility]) 07/04/11   r/t PAC  . Blood transfusion   . Cancer of descending colon ([facility]) 05/12/2011  . GERD (gastroesophageal reflux disease)   . History of migraine headaches   . Incisional hernia   . iron deficiency anemia   . Multiple hemangiomas    Liver  . 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 w/prolonged cold sensitivity--Oxaliplatin related  . Pneumothorax 07/19/11   Bilateral  . Renal cysts, acquired, bilateral    Past Surgical History:  Procedure Laterality Date  . ABDOMINAL HYSTERECTOMY  mid 20s   bleeding  . COLON SURGERY  05/17/11   Partial colectomy  . HERNIA REPAIR  09/06/12   lap incisional hernia  . INSERTION OF MESH  09/06/2012   Procedure: INSERTION OF MESH;  Surgeon: Gwenyth Ober, MD;  Location: [facility];  Service: General;  Laterality: N/A;  . PORT-A-CATH REMOVAL  05/06/2012   Procedure: MINOR REMOVAL PORT-A-CATH;  Surgeon: Gwenyth Ober, MD;  Location: [facility];  Service: General;  Laterality: Right;  . PORTACATH PLACEMENT  07/20/11   Right side placed  . PORTACATH PLACEMENT     left side & right side  . VENTRAL HERNIA REPAIR  09/06/2012   Procedure: LAPAROSCOPIC VENTRAL HERNIA;  Surgeon: Gwenyth Ober, MD;  Location: [facility];  Service: General;  Laterality: N/A;   Social History   Socioeconomic History  . Marital status: Married    Spouse name: Not on file  . Number of children: 3  . Years of education: Not on file  . Highest education level: Not on file  Occupational History  . Occupation: Radio broadcast assistant  Social Needs  . Financial resource strain: Not on file  . Food insecurity:    Worry: Not on file    Inability: Not on file  . Transportation needs:    Medical: Not on file    Non-medical: Not on file  Tobacco Use  . Smoking status: Never Smoker  . Smokeless tobacco: Never Used  Substance and Sexual Activity  . Alcohol use: No    Alcohol/week: 0.0 standard drinks  . Drug use: No  . Sexual activity: Yes    Comment: 1st intercourse 57 yo-Fewer than 5 partners  Lifestyle  . Physical activity:    Days per week: Not on file    Minutes per session: Not on file  . Stress: Not on file  Relationships  . Social connections:    Talks on phone: Not on file    Gets together: Not on file    Attends religious service: Not on file    Active member  of club or organization: Not on file    Attends meetings of clubs or organizations: Not on file    Relationship status: Not on file  Other Topics Concern  . Not on file  Social History Narrative   Lives with husband in a 1 story home.  Has 3 daughters and 7 grandkids and one on the way.     Works as a Merchandiser, retail.    Education: high school.   No Known Allergies Family History  Problem Relation Age of Onset  . Diabetes Mother   . Hypertension Mother   . Cancer Father        leukemia  . Cancer Maternal Uncle        lung  . Cancer Maternal Uncle        colon  . Cancer Maternal Uncle        prostate  . Cancer Brother        PROSTATE  . Other Brother        MVA     Current Outpatient Medications (Cardiovascular):  .  furosemide (LASIX) 20 MG tablet, Take 0.5  tablets (10 mg total) by mouth daily as needed.   Current Outpatient Medications (Analgesics):  .  ibuprofen (ADVIL,MOTRIN) 800 MG tablet, Take 1 tablet (800 mg total) by mouth every 8 (eight) hours as needed.   Current Outpatient Medications (Other):  Hospital doctor Bandages & Supports (MEDICAL COMPRESSION STOCKINGS) MISC, 1 Units by Does not apply route daily. Knee high, moderate compression .  hydroxypropyl methylcellulose (ISOPTO TEARS) 2.5 % ophthalmic solution, Place 1 drop into both eyes daily as needed for dry eyes. .  polyethylene glycol (MIRALAX / GLYCOLAX) packet, Take 17 g by mouth daily. Marland Kitchen  topiramate (TOPAMAX) 100 MG tablet, Take 1 tablet (100 mg total) by mouth at bedtime as needed. Reported on 04/03/2016 .  nortriptyline (PAMELOR) 10 MG capsule, Take 1 capsule (10 mg total) by mouth at bedtime. .  Vitamin D, Ergocalciferol, (DRISDOL) 50000 units CAPS capsule, Take 1 capsule (50,000 Units total) by mouth every 7 (seven) days.    Past medical history, social, surgical and family history all reviewed in electronic medical record.  No pertanent information unless stated regarding to the chief complaint.   Review of Systems:  No headache, visual changes, nausea, vomiting, diarrhea, constipation, dizziness, abdominal pain, skin rash, fevers, chills, night sweats, weight loss, swollen lymph nodes, body aches, joint swelling, muscle aches, chest pain, shortness of breath, mood changes.   Objective  Blood pressure 132/82, pulse 80, height 5\' 9"  (1.753 m), weight 250 lb (113.4 kg), SpO2 98 %.    General: No apparent distress alert and oriented x3 mood and affect normal, dressed appropriately.  HEENT: Pupils equal, extraocular movements intact  Respiratory: Patient's speak in full sentences and does not appear short of breath  Cardiovascular: No lower extremity edema, non tender, no erythema  Skin: Warm dry intact with no signs of infection or rash on extremities or on axial skeleton.    Abdomen: Soft nontender  Neuro: Cranial nerves II through XII are intact, neurovascularly intact in all extremities with 2+ DTRs and 2+ pulses.  Lymph: No lymphadenopathy of posterior or anterior cervical chain or axillae bilaterally.  Gait mild antalgic MSK:  Non tender with full range of motion and good stability and symmetric strength and tone of shoulders, elbows, wrist, hip, knee and ankles bilaterally.  Foot exam bilaterally shows severe pes planus.  Patient does have overpronation of the hindfoot.  Mild splaying between the first and second toes mild tenderness at the origin of the plantar fascia   Impression and Recommendations:     This case required medical decision making of moderate complexity. The above documentation has been reviewed and is accurate and complete Lyndal Pulley, DO       Note: This dictation was prepared with Dragon dictation along with smaller phrase technology. Any transcriptional errors that result from this process are unintentional.

## 2018-08-05 ENCOUNTER — Encounter: Payer: Self-pay | Admitting: Family Medicine

## 2018-08-05 ENCOUNTER — Ambulatory Visit: Payer: 59 | Admitting: Family Medicine

## 2018-08-05 VITALS — BP 132/82 | HR 80 | Ht 69.0 in | Wt 250.0 lb

## 2018-08-05 DIAGNOSIS — T451X5A Adverse effect of antineoplastic and immunosuppressive drugs, initial encounter: Secondary | ICD-10-CM | POA: Diagnosis not present

## 2018-08-05 DIAGNOSIS — G620 Drug-induced polyneuropathy: Secondary | ICD-10-CM | POA: Diagnosis not present

## 2018-08-05 DIAGNOSIS — M722 Plantar fascial fibromatosis: Secondary | ICD-10-CM

## 2018-08-05 MED ORDER — IBUPROFEN 800 MG PO TABS: 800.0000 mg | ORAL_TABLET | Freq: Three times a day (TID) | ORAL | 3 refills | Status: DC | PRN

## 2018-08-05 MED ORDER — VITAMIN D (ERGOCALCIFEROL) 1.25 MG (50000 UNIT) PO CAPS: 50000.0000 [IU] | ORAL_CAPSULE | ORAL | 0 refills | Status: DC

## 2018-08-05 MED ORDER — NORTRIPTYLINE HCL 10 MG PO CAPS
10.0000 mg | ORAL_CAPSULE | Freq: Every day | ORAL | 1 refills | Status: DC
Start: 2018-08-05 — End: 2019-01-09

## 2018-08-05 NOTE — Patient Instructions (Signed)
good to see you  Good shoes always Spenco orthotics "total support" online would be great  Ibuprofen as needed Nortriptyline at night low dose  Exercises 3 times a week.   ONce weekly vitamin D If not gone see me again in 4-6weeks and we will inject

## 2018-08-05 NOTE — Assessment & Plan Note (Addendum)
Bilateral, no injection given.  Discussed over-the-counter orthotics, icing regimen, home exercises, discussed worsening symptoms.  Patient started on nortriptyline for more of the peripheral neuropathy.  Discussed icing regimen and home exercises.  Patient will follow-up again in 4 weeks worsening symptoms consider injection  Spent  25 minutes with patient face-to-face and had greater than 50% of counseling including as described above in assessment and plan.

## 2018-08-27 IMAGING — CR DG CHEST 2V
2 series · 2 of 2 positions shown · non-contrast
Comparison: 05/25/2016

CLINICAL DATA: Acute presentation with central chest pain.
Dizziness and hypertension.

EXAM:
CHEST  2 VIEW

[w chest pa]
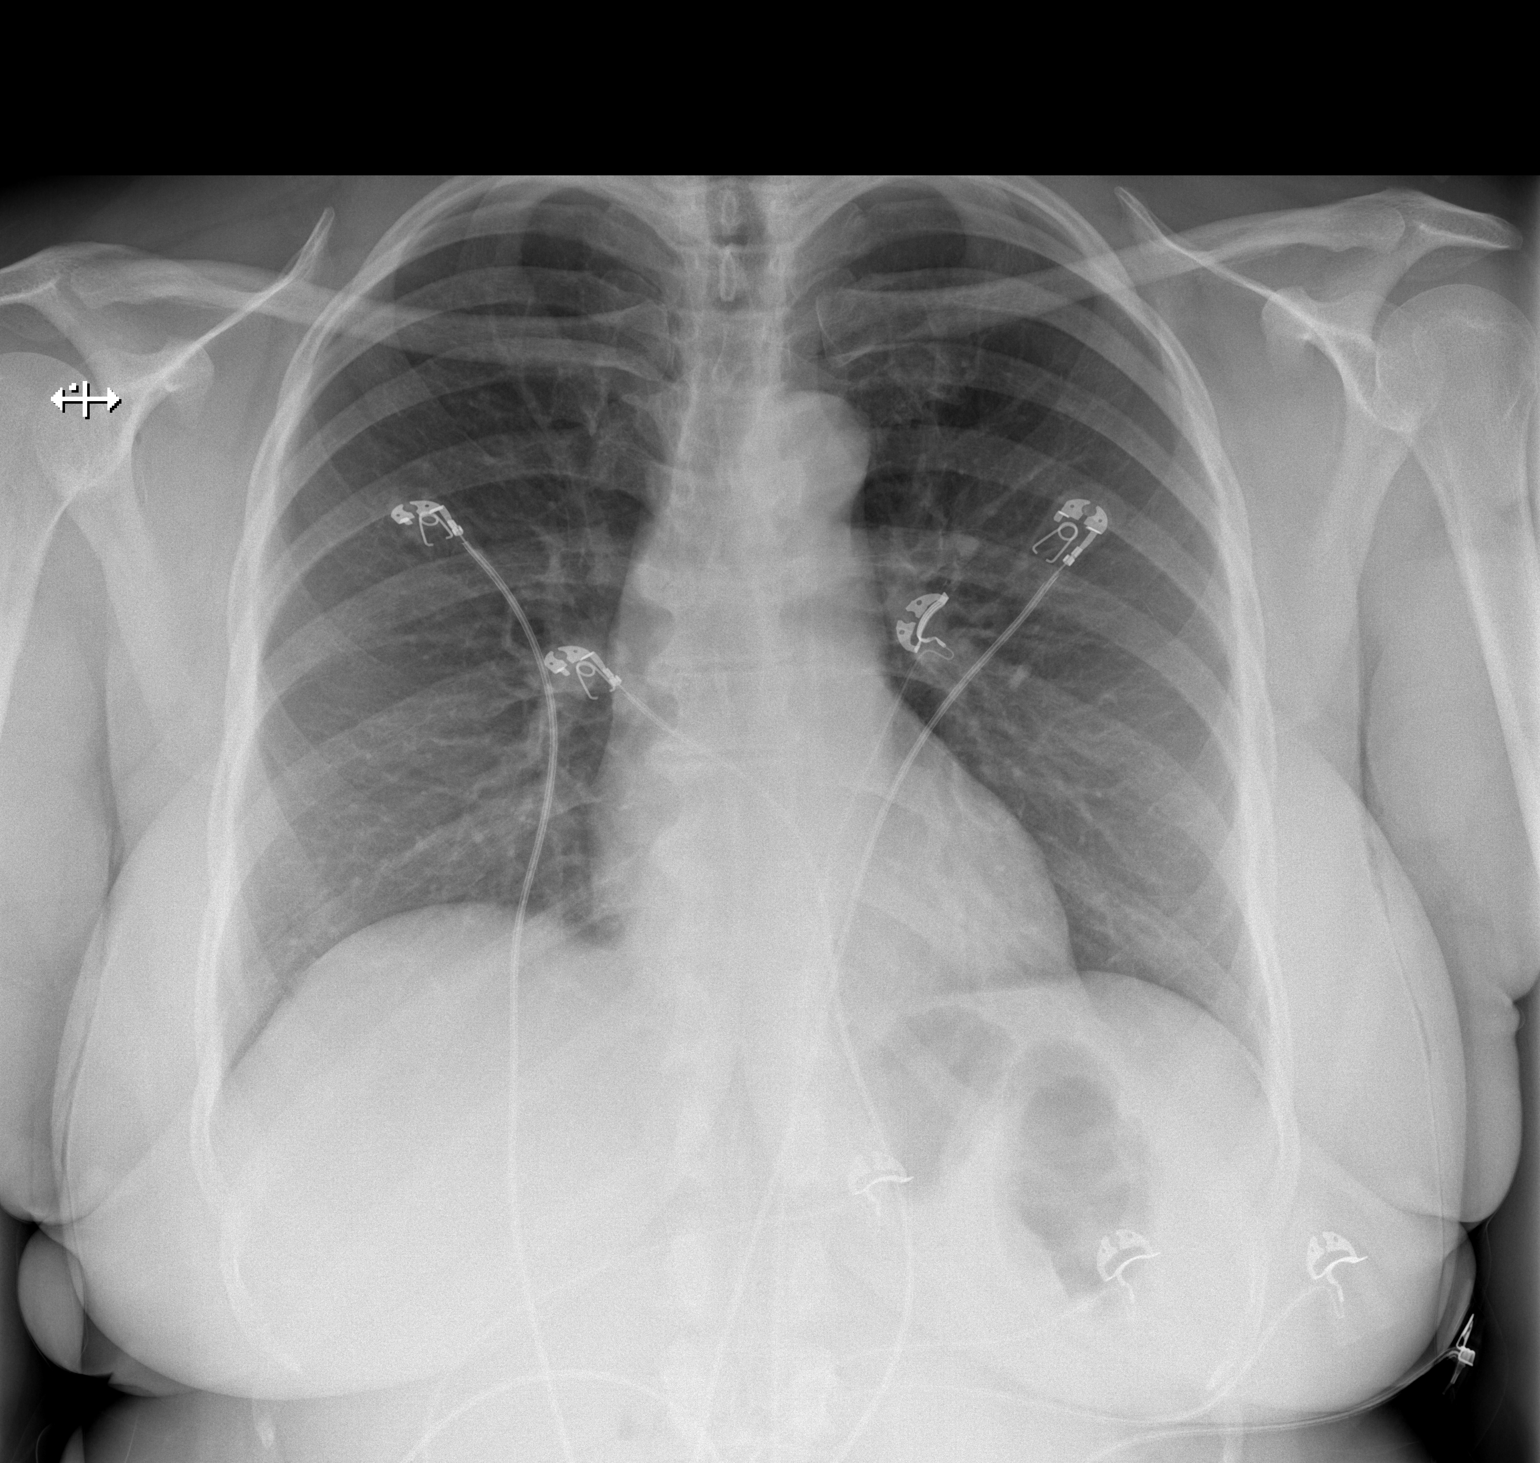

[w chest lat]
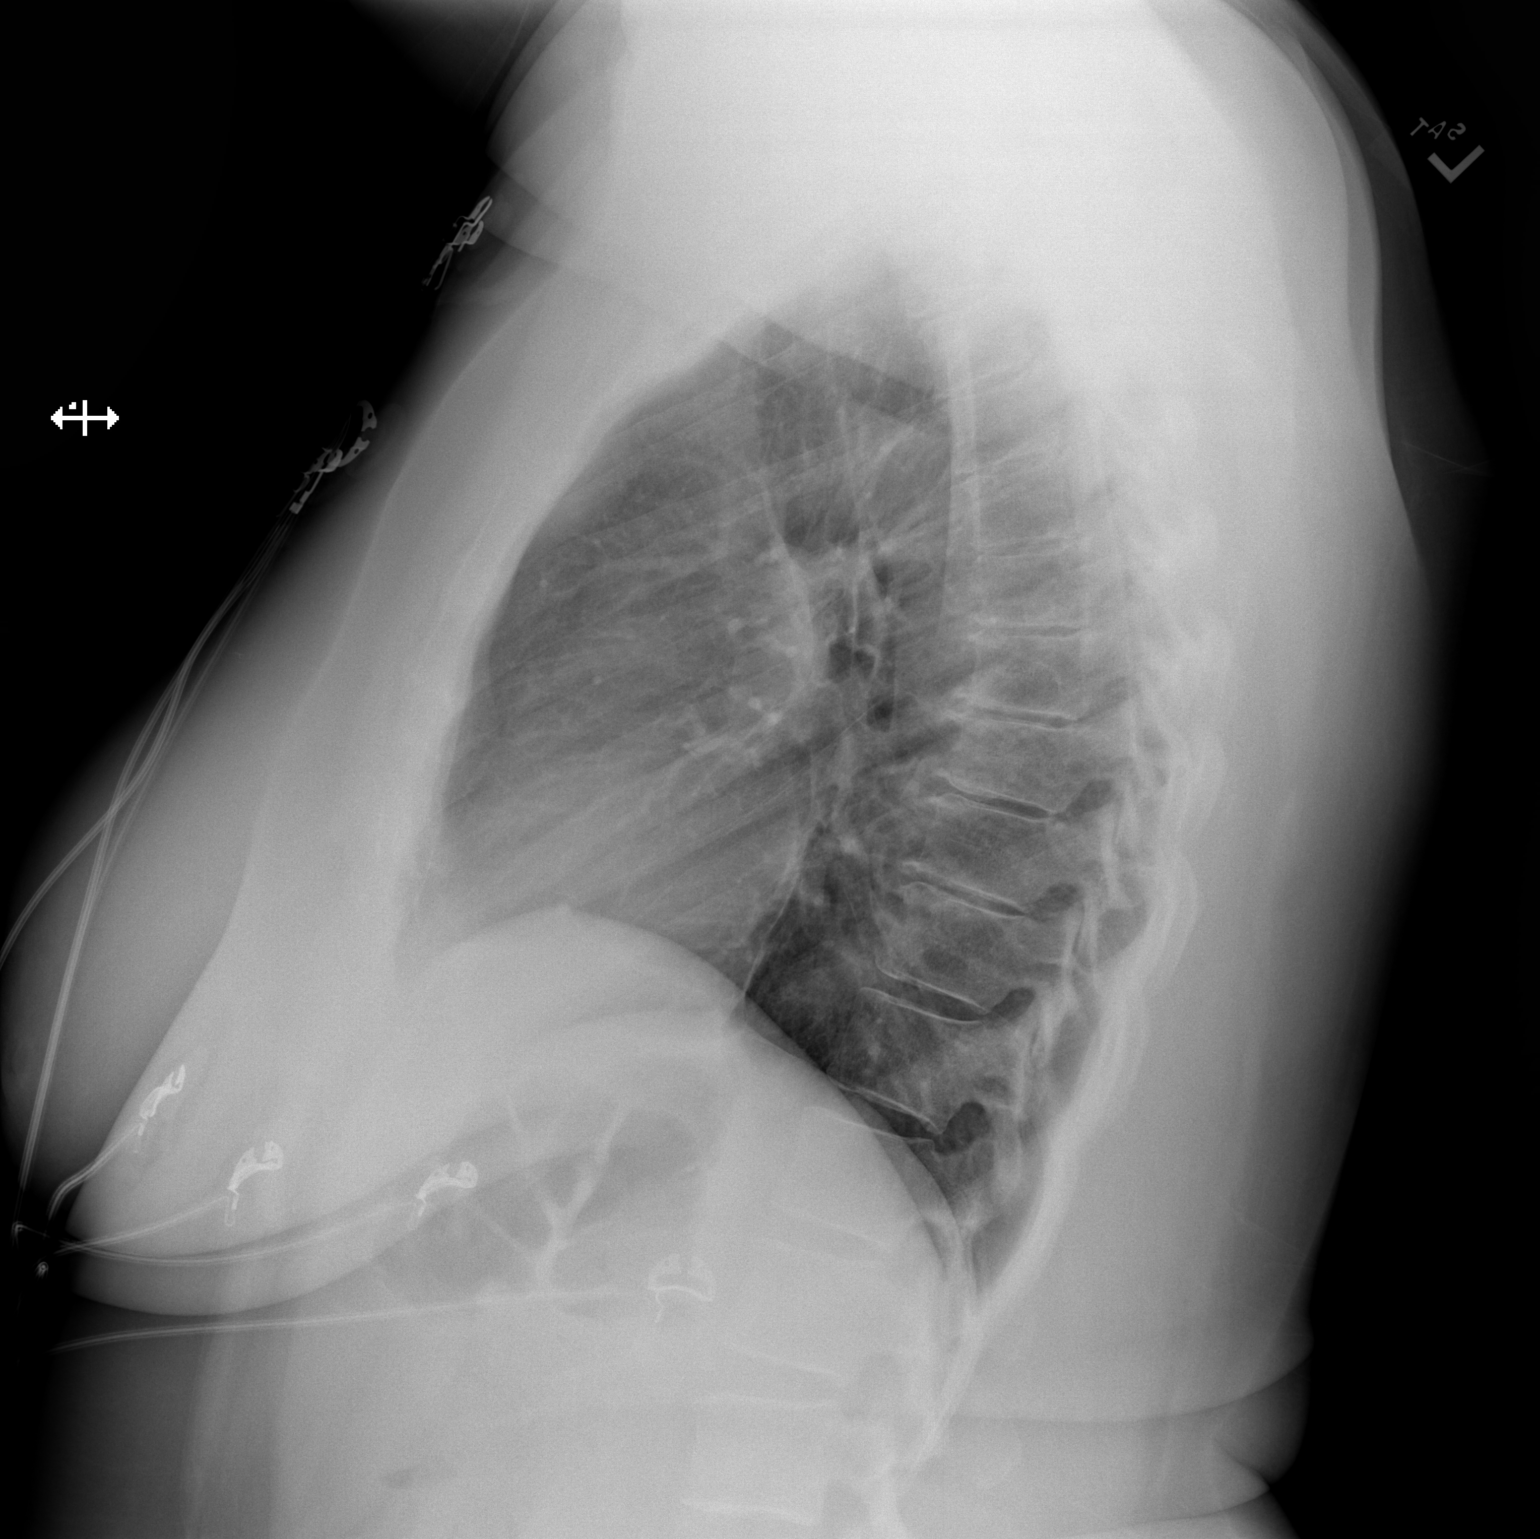

[2 of 2 positions shown; findings below may reference images not displayed]

FINDINGS: Artifact overlies the chest. Heart size is normal. Mediastinal
shadows are normal. No aortic calcifications seen. The pulmonary
vascularity is normal. The lungs are clear. No effusions. Ordinary
thoracic osteophytes from. No significant bone finding.
IMPRESSION: Normal chest

## 2018-09-14 ENCOUNTER — Telehealth: Payer: Self-pay | Admitting: Internal Medicine

## 2018-09-14 NOTE — Telephone Encounter (Signed)
Copied from [place] [id number]. Topic: General - Other >> Sep 14, 2018  3:36 PM Yvette Rack wrote: Reason for CRM: Pt states she was advised by [facility] that a new prescription for C-Pap supplies is needed. Cb# [phone]

## 2018-09-15 NOTE — Telephone Encounter (Signed)
Called patient and faxed rx to fax number [phone]

## 2018-09-15 NOTE — Telephone Encounter (Signed)
Do we have a fax number? Otherwise patient can pick up script.

## 2018-09-15 NOTE — Progress Notes (Signed)
[name] [address], [place] [address] Phone: [phone] Subjective:   [name], am serving as a scribe for Dr. Hulan Saas.  CC: Right foot pain  PYK:[id number]  Mary [name] is a 57 y.o. female coming in with complaint of right foot pain. She did try nortirptyline but did not have any relief. Pain is bilateral and seems to have increased since last visit. Pain starts in middle of foot and descends to back of heel.  Unable plantar fasciitis.  Has responded to injections previously.  Patient states that the pain is unrelenting.  Failed all conservative therapy.  Will be traveling and would like to be able to walk other than she is at the moment.  Started to have similar symptoms in the left foot     Past Medical History:  Diagnosis Date  . Adenocarcinoma sigmoid colon 05/12/11   Stage III(pT2pN2b) descending sigmoid  . Arm DVT (deep venous thromboembolism), acute ([facility]) 07/04/11   r/t PAC  . Blood transfusion   . Cancer of descending colon ([place]) 05/12/2011  . GERD (gastroesophageal reflux disease)   . History of migraine headaches   . Incisional hernia   . iron deficiency anemia   . Multiple hemangiomas    Liver  . 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 w/prolonged cold sensitivity--Oxaliplatin related  . Pneumothorax 07/19/11   Bilateral  . Renal cysts, acquired, bilateral    Past Surgical History:  Procedure Laterality Date  . ABDOMINAL HYSTERECTOMY  mid 20s   bleeding  . COLON SURGERY  05/17/11   Partial colectomy  . HERNIA REPAIR  09/06/12   lap incisional hernia  . INSERTION OF MESH  09/06/2012   Procedure: INSERTION OF MESH;  Surgeon: Gwenyth Ober, MD;  Location: [facility];  Service: General;  Laterality: N/A;  . PORT-A-CATH REMOVAL  05/06/2012   Procedure: MINOR REMOVAL PORT-A-CATH;  Surgeon: Gwenyth Ober, MD;  Location: [facility];  Service: General;  Laterality: Right;  . PORTACATH PLACEMENT   07/20/11   Right side placed  . PORTACATH PLACEMENT     left side & right side  . VENTRAL HERNIA REPAIR  09/06/2012   Procedure: LAPAROSCOPIC VENTRAL HERNIA;  Surgeon: Gwenyth Ober, MD;  Location: [facility];  Service: General;  Laterality: N/A;   Social History   Socioeconomic History  . Marital status: Married    Spouse name: Not on file  . Number of children: 3  . Years of education: Not on file  . Highest education level: Not on file  Occupational History  . Occupation: Radio broadcast assistant  Social Needs  . Financial resource strain: Not on file  . Food insecurity:    Worry: Not on file    Inability: Not on file  . Transportation needs:    Medical: Not on file    Non-medical: Not on file  Tobacco Use  . Smoking status: Never Smoker  . Smokeless tobacco: Never Used  Substance and Sexual Activity  . Alcohol use: No    Alcohol/week: 0.0 standard drinks  . Drug use: No  . Sexual activity: Yes    Comment: 1st intercourse 57 yo-Fewer than 5 partners  Lifestyle  . Physical activity:    Days per week: Not on file    Minutes per session: Not on file  . Stress: Not on file  Relationships  . Social connections:    Talks on phone: Not on file  Gets together: Not on file    Attends religious service: Not on file    Active member of club or organization: Not on file    Attends meetings of clubs or organizations: Not on file    Relationship status: Not on file  Other Topics Concern  . Not on file  Social History Narrative   Lives with husband in a 1 story home.  Has 3 daughters and 7 grandkids and one on the way.     Works as a Merchandiser, retail.    Education: high school.   No Known Allergies Family History  Problem Relation Age of Onset  . Diabetes Mother   . Hypertension Mother   . Cancer Father        leukemia  . Cancer Maternal Uncle        lung  . Cancer Maternal Uncle        colon  . Cancer Maternal Uncle        prostate  . Cancer Brother        PROSTATE  . Other  Brother        MVA     Current Outpatient Medications (Cardiovascular):  .  furosemide (LASIX) 20 MG tablet, Take 0.5 tablets (10 mg total) by mouth daily as needed.   Current Outpatient Medications (Analgesics):  .  ibuprofen (ADVIL,MOTRIN) 800 MG tablet, Take 1 tablet (800 mg total) by mouth every 8 (eight) hours as needed.   Current Outpatient Medications (Other):  Hospital doctor Bandages & Supports (MEDICAL COMPRESSION STOCKINGS) MISC, 1 Units by Does not apply route daily. Knee high, moderate compression .  hydroxypropyl methylcellulose (ISOPTO TEARS) 2.5 % ophthalmic solution, Place 1 drop into both eyes daily as needed for dry eyes. .  nortriptyline (PAMELOR) 10 MG capsule, Take 1 capsule (10 mg total) by mouth at bedtime. .  polyethylene glycol (MIRALAX / GLYCOLAX) packet, Take 17 g by mouth daily. Marland Kitchen  topiramate (TOPAMAX) 100 MG tablet, Take 1 tablet (100 mg total) by mouth at bedtime as needed. Reported on 04/03/2016 .  Vitamin D, Ergocalciferol, (DRISDOL) 50000 units CAPS capsule, Take 1 capsule (50,000 Units total) by mouth every 7 (seven) days.    Past medical history, social, surgical and family history all reviewed in electronic medical record.  No pertanent information unless stated regarding to the chief complaint.   Review of Systems:  No headache, visual changes, nausea, vomiting, diarrhea, constipation, dizziness, abdominal pain, skin rash, fevers, chills, night sweats, weight loss, swollen lymph nodes, body aches, joint swelling, muscle aches, chest pain, shortness of breath, mood changes.   Objective  There were no vitals taken for this visit. Systems examined below as of    General: No apparent distress alert and oriented x3 mood and affect normal, dressed appropriately.  HEENT: Pupils equal, extraocular movements intact  Respiratory: Patient's speak in full sentences and does not appear short of breath  Cardiovascular: No lower extremity edema, non tender, no  erythema  Skin: Warm dry intact with no signs of infection or rash on extremities or on axial skeleton.  Abdomen: Soft nontender  Neuro: Cranial nerves II through XII are intact, neurovascularly intact in all extremities with 2+ DTRs and 2+ pulses.  Lymph: No lymphadenopathy of posterior or anterior cervical chain or axillae bilaterally.  Gait antalgic MSK:  Non tender with full range of motion and good stability and symmetric strength and tone of shoulders, elbows, wrist, hip, knee and ankles bilaterally.  Mild arthritic changes of  multiple joints Patient does have severe breakdown under the longitudinal arch of the foot.  Severely tender to palpation calcaneal area medially mostly.  Peripheral neuropathy noted bilaterally  Procedure: Real-time Ultrasound Guided Injection of right plantar fascia Device: GE Logiq Q7 Ultrasound guided injection is preferred based studies that show increased duration, increased effect, greater accuracy, decreased procedural pain, increased response rate, and decreased cost with ultrasound guided versus blind injection.  Verbal informed consent obtained.  Time-out conducted.  Noted no overlying erythema, induration, or other signs of local infection.  Skin prepped in a sterile fashion.  Local anesthesia: Topical Ethyl chloride.  With sterile technique and under real time ultrasound guidance: With a 21-gauge 2 inch needle injected on the medial calcaneal side into the plantar fascia under ultrasound guidance with a total of 0.5 cc of 0.5% Marcaine and 0.5 cc of Kenalog 40 mg/mL Completed without difficulty  Pain immediately resolved suggesting accurate placement of the medication.  Advised to call if fevers/chills, erythema, induration, drainage, or persistent bleeding.  Images permanently stored and available for review in the ultrasound unit.  Impression: Technically successful ultrasound guided injection.    Impression and Recommendations:     This case  required medical decision making of moderate complexity. The above documentation has been reviewed and is accurate and complete Lyndal Pulley, DO       Note: This dictation was prepared with Dragon dictation along with smaller phrase technology. Any transcriptional errors that result from this process are unintentional.

## 2018-09-17 ENCOUNTER — Ambulatory Visit: Payer: 59 | Admitting: Family Medicine

## 2018-09-17 ENCOUNTER — Other Ambulatory Visit (INDEPENDENT_AMBULATORY_CARE_PROVIDER_SITE_OTHER): Payer: 59

## 2018-09-17 ENCOUNTER — Ambulatory Visit: Payer: Self-pay

## 2018-09-17 VITALS — BP 128/90 | HR 83 | Ht 69.0 in | Wt 250.0 lb

## 2018-09-17 DIAGNOSIS — M722 Plantar fascial fibromatosis: Secondary | ICD-10-CM | POA: Diagnosis not present

## 2018-09-17 DIAGNOSIS — M2550 Pain in unspecified joint: Secondary | ICD-10-CM

## 2018-09-17 DIAGNOSIS — M79671 Pain in right foot: Secondary | ICD-10-CM

## 2018-09-17 DIAGNOSIS — G620 Drug-induced polyneuropathy: Secondary | ICD-10-CM

## 2018-09-17 DIAGNOSIS — T451X5A Adverse effect of antineoplastic and immunosuppressive drugs, initial encounter: Secondary | ICD-10-CM

## 2018-09-17 LAB — CBC WITH DIFFERENTIAL/PLATELET
Basophils Absolute: 0.1 10*3/uL (ref 0.0–0.1)
Basophils Relative: 1.1 % (ref 0.0–3.0)
Eosinophils Absolute: 0.2 10*3/uL (ref 0.0–0.7)
Eosinophils Relative: 2.7 % (ref 0.0–5.0)
HCT: 37.9 % (ref 36.0–46.0)
Hemoglobin: 12.1 g/dL (ref 12.0–15.0)
Lymphocytes Relative: 30.5 % (ref 12.0–46.0)
Lymphs Abs: 1.9 10*3/uL (ref 0.7–4.0)
MCHC: 31.9 g/dL (ref 30.0–36.0)
MCV: 70.4 fl — ABNORMAL LOW (ref 78.0–100.0)
Monocytes Absolute: 0.5 10*3/uL (ref 0.1–1.0)
Monocytes Relative: 7.3 % (ref 3.0–12.0)
Neutro Abs: 3.7 10*3/uL (ref 1.4–7.7)
Neutrophils Relative %: 58.4 % (ref 43.0–77.0)
Platelets: 191 10*3/uL (ref 150.0–400.0)
RBC: 5.39 Mil/uL — ABNORMAL HIGH (ref 3.87–5.11)
RDW: 17.1 % — ABNORMAL HIGH (ref 11.5–15.5)
WBC: 6.3 10*3/uL (ref 4.0–10.5)

## 2018-09-17 LAB — COMPREHENSIVE METABOLIC PANEL
ALT: 19 U/L (ref 0–35)
AST: 16 U/L (ref 0–37)
Albumin: 4.2 g/dL (ref 3.5–5.2)
Alkaline Phosphatase: 89 U/L (ref 39–117)
BUN: 17 mg/dL (ref 6–23)
CO2: 28 mEq/L (ref 19–32)
Calcium: 9.1 mg/dL (ref 8.4–10.5)
Chloride: 105 mEq/L (ref 96–112)
Creatinine, Ser: 1.13 mg/dL (ref 0.40–1.20)
GFR: 63.78 mL/min (ref >60.00)
Glucose, Bld: 107 mg/dL — ABNORMAL HIGH (ref 70–99)
Potassium: 3.8 mEq/L (ref 3.5–5.1)
Sodium: 139 mEq/L (ref 135–145)
Total Bilirubin: 0.4 mg/dL (ref 0.2–1.2)
Total Protein: 7.2 g/dL (ref 6.0–8.3)

## 2018-09-17 LAB — URIC ACID: Uric Acid, Serum: 6.6 mg/dL (ref 2.4–7.0)

## 2018-09-17 LAB — VITAMIN B12: Vitamin B-12: 195 pg/mL — ABNORMAL LOW (ref 211–911)

## 2018-09-17 LAB — SEDIMENTATION RATE: Sed Rate: 27 mm/hr (ref 0–30)

## 2018-09-17 NOTE — Patient Instructions (Signed)
Good to see you  Ice is your friend Stay active We will get labs and see if anything else is going on  See me again in 4-6 weeks

## 2018-09-18 ENCOUNTER — Encounter: Payer: Self-pay | Admitting: Family Medicine

## 2018-09-18 NOTE — Assessment & Plan Note (Signed)
Details worked up.  Patient likely has malnutrition and absorption secondary to patient's history of chemotherapy and surgical intervention for colectomy

## 2018-09-18 NOTE — Assessment & Plan Note (Signed)
Patient given injection today.  Tolerated the procedure well.  Discussed with patient that custom orthotics will be needed.  Laboratory work-up to rule out any other abnormality that could be contributing such as iron deficiency into why patient continues to have this problem as well as having difficulty with healing.  Follow-up again in 4 to 6 weeks

## 2018-09-19 LAB — PTH, INTACT AND CALCIUM
Calcium: 9.3 mg/dL (ref 8.6–10.4)
PTH: 106 pg/mL — ABNORMAL HIGH (ref 14–64)

## 2018-09-19 LAB — CALCIUM, IONIZED: Calcium, Ion: 5.13 mg/dL (ref 4.8–5.6)

## 2018-09-21 ENCOUNTER — Other Ambulatory Visit: Payer: Self-pay | Admitting: Family Medicine

## 2018-09-21 ENCOUNTER — Telehealth: Payer: Self-pay

## 2018-09-21 DIAGNOSIS — Z933 Colostomy status: Secondary | ICD-10-CM | POA: Diagnosis not present

## 2018-09-21 DIAGNOSIS — G4733 Obstructive sleep apnea (adult) (pediatric): Secondary | ICD-10-CM | POA: Diagnosis not present

## 2018-09-21 DIAGNOSIS — R269 Unspecified abnormalities of gait and mobility: Secondary | ICD-10-CM | POA: Diagnosis not present

## 2018-09-21 MED ORDER — B-12 1000 MCG/ML IJ KIT: 1.0000 mL | PACK | INTRAMUSCULAR | 3 refills | Status: DC

## 2018-09-21 NOTE — Telephone Encounter (Signed)
Called Patient about lab results. Left message to call back.

## 2018-09-21 NOTE — Progress Notes (Signed)
b12

## 2019-01-09 ENCOUNTER — Ambulatory Visit (HOSPITAL_COMMUNITY)
Admission: EM | Admit: 2019-01-09 | Discharge: 2019-01-09 | Disposition: A | Discharge status: Home / Self Care | Payer: 59 | Attending: Family Medicine | Admitting: Family Medicine

## 2019-01-09 ENCOUNTER — Encounter (HOSPITAL_COMMUNITY): Payer: Self-pay | Admitting: Emergency Medicine

## 2019-01-09 DIAGNOSIS — H00011 Hordeolum externum right upper eyelid: Secondary | ICD-10-CM

## 2019-01-09 MED ORDER — ERYTHROMYCIN 5 MG/GM OP OINT: TOPICAL_OINTMENT | OPHTHALMIC | 0 refills | Status: DC

## 2019-01-09 NOTE — ED Provider Notes (Signed)
[facility]   [id number] 01/09/19 Arrival Time: 3235  CC: Eyelid swelling  SUBJECTIVE:  Mary [name] is a 58 y.o. female who presents with complaint of RT eye lid swelling and redness x 2 days.  Symptoms began after she plucked one of her eyelashes.  Has tried rinsing with water without relief.  Reports mild pain when she shuts her eye.  Denies eye pain, discharge, itching, vision changes, double vision, FB sensation, fever, chills, nausea, or vomiting.    Admits to contact lens use.    ROS: As per HPI.  Past Medical History:  Diagnosis Date  . Adenocarcinoma sigmoid colon 05/12/11   Stage III(pT2pN2b) descending sigmoid  . Arm DVT (deep venous thromboembolism), acute ([facility]) 07/04/11   r/t PAC  . Blood transfusion   . Cancer of descending colon ([facility]) 05/12/2011  . GERD (gastroesophageal reflux disease)   . History of migraine headaches   . Incisional hernia   . iron deficiency anemia   . Multiple hemangiomas    Liver  . Neuropathy due to drug ([facility]) 2012   Early--assoc. w/prolonged cold sensitivity--Oxaliplatin related  . Pneumothorax 07/19/11   Bilateral  . Renal cysts, acquired, bilateral    Past Surgical History:  Procedure Laterality Date  . ABDOMINAL HYSTERECTOMY  mid 20s   bleeding  . COLON SURGERY  05/17/11   Partial colectomy  . HERNIA REPAIR  09/06/12   lap incisional hernia  . INSERTION OF MESH  09/06/2012   Procedure: INSERTION OF MESH;  Surgeon: Gwenyth Ober, MD;  Location: [facility];  Service: General;  Laterality: N/A;  . PORT-A-CATH REMOVAL  05/06/2012   Procedure: MINOR REMOVAL PORT-A-CATH;  Surgeon: Gwenyth Ober, MD;  Location: [facility];  Service: General;  Laterality: Right;  . PORTACATH PLACEMENT  07/20/11   Right side placed  . PORTACATH PLACEMENT     left side & right side  . VENTRAL HERNIA REPAIR  09/06/2012   Procedure: LAPAROSCOPIC VENTRAL HERNIA;  Surgeon: Gwenyth Ober, MD;  Location: [facility];  Service: General;  Laterality:  N/A;   No Known Allergies No current facility-administered medications on file prior to encounter.    Current Outpatient Medications on File Prior to Encounter  Medication Sig Dispense Refill  . Elastic Bandages & Supports (MEDICAL COMPRESSION STOCKINGS) MISC 1 Units by Does not apply route daily. Knee high, moderate compression 2 each 0  . polyethylene glycol (MIRALAX / GLYCOLAX) packet Take 17 g by mouth daily. 14 each 0  . [DISCONTINUED] potassium chloride SA (K-DUR,KLOR-CON) 20 MEQ tablet Take 20 mEq by mouth daily.    . [DISCONTINUED] prochlorperazine (COMPAZINE) 10 MG tablet Take 10 mg by mouth every 6 (six) hours as needed. For nausea/vomiting.     Social History   Socioeconomic History  . Marital status: Married    Spouse name: Not on file  . Number of children: 3  . Years of education: Not on file  . Highest education level: Not on file  Occupational History  . Occupation: Radio broadcast assistant  Social Needs  . Financial resource strain: Not on file  . Food insecurity:    Worry: Not on file    Inability: Not on file  . Transportation needs:    Medical: Not on file    Non-medical: Not on file  Tobacco Use  . Smoking status: Never Smoker  . Smokeless tobacco: Never Used  Substance and Sexual Activity  . Alcohol use: No    Alcohol/week: 0.0  standard drinks  . Drug use: No  . Sexual activity: Yes    Comment: 1st intercourse 58 yo-Fewer than 5 partners  Lifestyle  . Physical activity:    Days per week: Not on file    Minutes per session: Not on file  . Stress: Not on file  Relationships  . Social connections:    Talks on phone: Not on file    Gets together: Not on file    Attends religious service: Not on file    Active member of club or organization: Not on file    Attends meetings of clubs or organizations: Not on file    Relationship status: Not on file  . Intimate partner violence:    Fear of current or ex partner: Not on file    Emotionally abused: Not on file     Physically abused: Not on file    Forced sexual activity: Not on file  Other Topics Concern  . Not on file  Social History Narrative   Lives with husband in a 1 story home.  Has 3 daughters and 7 grandkids and one on the way.     Works as a Merchandiser, retail.    Education: high school.   Family History  Problem Relation Age of Onset  . Diabetes Mother   . Hypertension Mother   . Cancer Father        leukemia  . Cancer Maternal Uncle        lung  . Cancer Maternal Uncle        colon  . Cancer Maternal Uncle        prostate  . Cancer Brother        PROSTATE  . Other Brother        MVA    OBJECTIVE:  Vitals:   01/09/19 1224  BP: (!) 160/90  Pulse: 73  Resp: 16  Temp: 97.7 F (36.5 C)  SpO2: 100%    General appearance: alert; no distress Eyes: no conjunctival erythema, PERRL, RT upper eyelid swelling with mild overlying erythema, no obvious abscess or nodule, mildly TTP, EOMI without pain Neck: supple Lungs: clear to auscultation bilaterally Heart: regular rate and rhythm Skin: warm and dry Psychological: alert and cooperative; normal mood and affect   ASSESSMENT & PLAN:  1. Hordeolum externum of right upper eyelid     Meds ordered this encounter  Medications  . erythromycin ophthalmic ointment    Sig: Apply to RT upper eyelid up to 6 times daily for 5-7 days,    Dispense:  3.5 g    Refill:  0    Order Specific Question:   Supervising Provider    Answer:   Raylene Everts [[id number]]    Avoid wearing your contacts until your symptoms have resolved Continue warm compresses at home.  Soak a wash cloth in warm (not scalding) water and place it over the eyes. As the wash cloth cools, it should be rewarmed and replaced for a total of 5 to 10 minutes of soaking time. Warm compresses should be applied two to four times a day as long as the patient has symptoms Perform lid washing: Either warm water or very dilute baby shampoo can be placed on a clean wash cloth,  gauze pad, or cotton swab. Then be advised to gently clean along the lashes and lid margin to remove the accumulated material with care to avoid contacting the ocular surface. If shampoo is used, thorough rinsing is recommended. Vigorous washing  should be avoided, as it may cause more irritation.  Prescribed erythromycin ointment.  Apply up to 6 times daily for 5-7 days, or until symptomatic improvement Follow up with opthalmology for further evaluation and management Return or go to ER if you have any new or worsening symptoms such as eye pain, discharge, itching, vision changes, double vision, fever, chills, nausea, vomiting, etc...  Reviewed expectations re: course of current medical issues. Questions answered. Outlined signs and symptoms indicating need for more acute intervention. Patient verbalized understanding. After Visit Summary given.   Lestine Box, PA-C 01/09/19 1313

## 2019-01-09 NOTE — ED Triage Notes (Signed)
Pt c/o R upper eye lid swelling, states she can feel a little pain when she shuts her eye. Symptoms x2 days.

## 2019-01-09 NOTE — Discharge Instructions (Addendum)
Avoid wearing your contacts until your symptoms have resolved Continue warm compresses at home.  Soak a wash cloth in warm (not scalding) water and place it over the eyes. As the wash cloth cools, it should be rewarmed and replaced for a total of 5 to 10 minutes of soaking time. Warm compresses should be applied two to four times a day as long as the patient has symptoms Perform lid washing: Either warm water or very dilute baby shampoo can be placed on a clean wash cloth, gauze pad, or cotton swab. Then be advised to gently clean along the lashes and lid margin to remove the accumulated material with care to avoid contacting the ocular surface. If shampoo is used, thorough rinsing is recommended. Vigorous washing should be avoided, as it may cause more irritation.  Prescribed erythromycin ointment.  Apply up to 6 times daily for 5-7 days, or until symptomatic improvement Follow up with opthalmology for further evaluation and management Return or go to ER if you have any new or worsening symptoms such as eye pain, discharge, itching, vision changes, double vision, fever, chills, nausea, vomiting, etc..Marland Kitchen

## 2019-06-26 ENCOUNTER — Encounter (HOSPITAL_COMMUNITY): Payer: Self-pay

## 2019-06-26 ENCOUNTER — Emergency Department (HOSPITAL_COMMUNITY)
Admission: EM | Admit: 2019-06-26 | Discharge: 2019-06-27 | Disposition: A | Discharge status: Home / Self Care | Payer: 59 | Attending: Emergency Medicine | Admitting: Emergency Medicine

## 2019-06-26 ENCOUNTER — Emergency Department (HOSPITAL_COMMUNITY): Payer: 59

## 2019-06-26 ENCOUNTER — Other Ambulatory Visit: Payer: Self-pay

## 2019-06-26 DIAGNOSIS — R0789 Other chest pain: Secondary | ICD-10-CM | POA: Insufficient documentation

## 2019-06-26 DIAGNOSIS — R0602 Shortness of breath: Secondary | ICD-10-CM | POA: Insufficient documentation

## 2019-06-26 DIAGNOSIS — R079 Chest pain, unspecified: Secondary | ICD-10-CM

## 2019-06-26 DIAGNOSIS — Z85038 Personal history of other malignant neoplasm of large intestine: Secondary | ICD-10-CM | POA: Diagnosis not present

## 2019-06-26 LAB — BASIC METABOLIC PANEL
Anion gap: 9 (ref 5–15)
BUN: 15 mg/dL (ref 6–20)
CO2: 24 mmol/L (ref 22–32)
Calcium: 9.3 mg/dL (ref 8.9–10.3)
Chloride: 106 mmol/L (ref 98–111)
Creatinine, Ser: 1.33 mg/dL — ABNORMAL HIGH (ref 0.44–1.00)
GFR calc Af Amer: 51 mL/min — ABNORMAL LOW (ref >60)
GFR calc non Af Amer: 44 mL/min — ABNORMAL LOW (ref >60)
Glucose, Bld: 124 mg/dL — ABNORMAL HIGH (ref 70–99)
Potassium: 3.9 mmol/L (ref 3.5–5.1)
Sodium: 139 mmol/L (ref 135–145)

## 2019-06-26 LAB — CBC
HCT: 40 % (ref 36.0–46.0)
Hemoglobin: 12.3 g/dL (ref 12.0–15.0)
MCH: 22.4 pg — ABNORMAL LOW (ref 26.0–34.0)
MCHC: 30.8 g/dL (ref 30.0–36.0)
MCV: 72.7 fL — ABNORMAL LOW (ref 80.0–100.0)
Platelets: 191 10*3/uL (ref 150–400)
RBC: 5.5 MIL/uL — ABNORMAL HIGH (ref 3.87–5.11)
RDW: 17.2 % — ABNORMAL HIGH (ref 11.5–15.5)
WBC: 7.7 10*3/uL (ref 4.0–10.5)
nRBC: 0 % (ref 0.0–0.2)

## 2019-06-26 LAB — TROPONIN I (HIGH SENSITIVITY)
Troponin I (High Sensitivity): 4 ng/L (ref <18)
Troponin I (High Sensitivity): 4 ng/L (ref <18)

## 2019-06-26 LAB — I-STAT BETA HCG BLOOD, ED (MC, WL, AP ONLY): I-stat hCG, quantitative: <5 m[IU]/mL (ref <5)

## 2019-06-26 MED ORDER — PANTOPRAZOLE SODIUM 20 MG PO TBEC: 20.0000 mg | DELAYED_RELEASE_TABLET | Freq: Every day | ORAL | 0 refills | Status: DC

## 2019-06-26 MED ORDER — ALUM & MAG HYDROXIDE-SIMETH 200-200-20 MG/5ML PO SUSP
30.0000 mL | Freq: Once | ORAL | Status: AC
  Administered 2019-06-26: 30 mL via ORAL
  Filled 2019-06-26: qty 30

## 2019-06-26 MED ORDER — PANTOPRAZOLE SODIUM 40 MG PO TBEC
40.0000 mg | DELAYED_RELEASE_TABLET | Freq: Once | ORAL | Status: AC
  Administered 2019-06-27: 40 mg via ORAL
  Filled 2019-06-26: qty 1

## 2019-06-26 MED ORDER — SODIUM CHLORIDE 0.9% FLUSH: 3.0000 mL | Freq: Once | INTRAVENOUS | Status: DC

## 2019-06-26 MED ORDER — LIDOCAINE VISCOUS HCL 2 % MT SOLN
15.0000 mL | Freq: Once | OROMUCOSAL | Status: AC
  Administered 2019-06-26: 15 mL via ORAL
  Filled 2019-06-26: qty 15

## 2019-06-26 NOTE — ED Triage Notes (Signed)
Pt states that she started having CP and SOB after eating an orange 15 minutes ago, no radiation, denies n/v

## 2019-06-26 NOTE — ED Provider Notes (Signed)
[facility] EMERGENCY DEPARTMENT Provider Note   CSN: [id number] Arrival date & time: 06/26/19  2006    History   Chief Complaint Chief Complaint  Patient presents with  . Chest Pain    HPI Mary [name] is a 58 y.o. female with a hx of colon cancer (clinical remission), DVT, GERD, peripheral neuropathy presents to the Emergency Department complaining of gradual, persistent, progressively worsening central chest pain immediately after eating an nectarine onset 15 min PTA.   Pt reports she felt as if the fruit got stuck.  Pt reports eating bread and drinking water to help move the food bolus.  She reports pain was initially 10/10 but is now 7/10.  She reports no vomiting or nausea. She has been able to drink without difficulty.  Nothing seems to make the symptoms better or worse.  pt denies fever, chills, headache, neck pain, shortness of breath, abdominal pain, weakness, dizziness, syncope.Marland Kitchen    HPI: A 58 year old patient presents for evaluation of chest pain. Initial onset of pain was approximately 1-3 hours ago. The patient's chest pain is well-localized, is described as heaviness/pressure/tightness and is not worse with exertion. The patient's chest pain is middle- or left-sided, is not sharp and does not radiate to the arms/jaw/neck. The patient does not complain of nausea and denies diaphoresis. The patient has no history of stroke, has no history of peripheral artery disease, has not smoked in the past 90 days, denies any history of treated diabetes, has no relevant family history of coronary artery disease (first degree relative at less than age 72), is not hypertensive, has no history of hypercholesterolemia and does not have an elevated BMI (>=30).   The history is provided by the patient and medical records. No language interpreter was used.    Past Medical History:  Diagnosis Date  . Adenocarcinoma sigmoid colon 05/12/11   Stage III(pT2pN2b) descending sigmoid   . Arm DVT (deep venous thromboembolism), acute ([place]) 07/04/11   r/t PAC  . Blood transfusion   . Cancer of descending colon ([place]) 05/12/2011  . GERD (gastroesophageal reflux disease)   . History of migraine headaches   . Incisional hernia   . iron deficiency anemia   . Multiple hemangiomas    Liver  . Neuropathy due to drug ([place]) 2012   Early--assoc. w/prolonged cold sensitivity--Oxaliplatin related  . Pneumothorax 07/19/11   Bilateral  . Renal cysts, acquired, bilateral     Patient Active Problem List   Diagnosis Date Noted  . Labial cyst 03/05/2018  . Chest pain 05/30/2017  . Chronic nonintractable headache 05/04/2017  . Ankle edema, bilateral 05/04/2017  . Cellulitis 04/04/2016  . Abnormal x-ray 04/04/2016  . Hearing loss due to cerumen impaction 01/14/2016  . Impaired fasting blood sugar 01/14/2016  . Plantar fasciitis, right 11/22/2015  . Right calcaneal fracture 08/15/2015  . OSA (obstructive sleep apnea) 09/26/2014  . Peripheral neuropathy due to chemotherapy ([place]) 06/05/2014  . Obesity 06/05/2014  . history of DVT (deep venous thrombosis) 02/20/2014  . GERD (gastroesophageal reflux disease) 02/20/2014  . Hernia 09/22/2012  . Iron deficiency anemia, unspecified 08/30/2011  . Colon cancer ([place]) 08/29/2011  . Cancer of descending colon ([place]) 05/12/2011    Past Surgical History:  Procedure Laterality Date  . ABDOMINAL HYSTERECTOMY  mid 20s   bleeding  . COLON SURGERY  05/17/11   Partial colectomy  . HERNIA REPAIR  09/06/12   lap incisional hernia  . INSERTION OF MESH  09/06/2012   Procedure:  INSERTION OF MESH;  Surgeon: Gwenyth Ober, MD;  Location: [facility];  Service: General;  Laterality: N/A;  . PORT-A-CATH REMOVAL  05/06/2012   Procedure: MINOR REMOVAL PORT-A-CATH;  Surgeon: Gwenyth Ober, MD;  Location: [facility];  Service: General;  Laterality: Right;  . PORTACATH PLACEMENT  07/20/11   Right side placed  . PORTACATH PLACEMENT     left side & right  side  . VENTRAL HERNIA REPAIR  09/06/2012   Procedure: LAPAROSCOPIC VENTRAL HERNIA;  Surgeon: Gwenyth Ober, MD;  Location: [facility];  Service: General;  Laterality: N/A;     OB History    Gravida  5   Para  3   Term      Preterm      AB  2   Living  3     SAB  2   TAB      Ectopic      Multiple      Live Births               Home Medications    Prior to Admission medications   Medication Sig Start Date End Date Taking? Authorizing Provider  NON FORMULARY Take 1 capsule by mouth See admin instructions. "Keto Cuts PM Burn Fast Instead of Carbs Ketogenic Sleep Formula for Weight Loss" capsules: Take 1 capsule by mouth at bedtime as needed to aid with desired weight loss   Yes [provider]  Elastic Bandages & Supports (MEDICAL COMPRESSION STOCKINGS) MISC 1 Units by Does not apply route daily. Knee high, moderate compression 05/04/17   Nche, Charlene Brooke, NP  erythromycin ophthalmic ointment Apply to RT upper eyelid up to 6 times daily for 5-7 days, Patient not taking: Reported on 06/26/2019 01/09/19   Wurst, [place], PA-C  pantoprazole (PROTONIX) 20 MG tablet Take 1 tablet (20 mg total) by mouth daily. 06/26/19   Gae Bihl, Jarrett Soho, PA-C  polyethylene glycol (MIRALAX / GLYCOLAX) packet Take 17 g by mouth daily. Patient not taking: Reported on 06/26/2019 02/27/14   Verlee Monte, MD  potassium chloride SA (K-DUR,KLOR-CON) 20 MEQ tablet Take 20 mEq by mouth daily. 11/25/11 01/20/12  Owens Shark, NP  prochlorperazine (COMPAZINE) 10 MG tablet Take 10 mg by mouth every 6 (six) hours as needed. For nausea/vomiting.  01/18/12  Ladell Pier, MD    Family History Family History  Problem Relation Age of Onset  . Diabetes Mother   . Hypertension Mother   . Cancer Father        leukemia  . Cancer Maternal Uncle        lung  . Cancer Maternal Uncle        colon  . Cancer Maternal Uncle        prostate  . Cancer Brother        PROSTATE  . Other Brother         MVA    Social History Social History   Tobacco Use  . Smoking status: Never Smoker  . Smokeless tobacco: Never Used  Substance Use Topics  . Alcohol use: No    Alcohol/week: 0.0 standard drinks  . Drug use: No     Allergies   Patient has no known allergies.   Review of Systems Review of Systems  Constitutional: Negative for appetite change, diaphoresis, fatigue, fever and unexpected weight change.  HENT: Negative for mouth sores.   Eyes: Negative for visual disturbance.  Respiratory: Negative for cough, chest tightness, shortness of  breath and wheezing.   Cardiovascular: Positive for chest pain.  Gastrointestinal: Negative for abdominal pain, constipation, diarrhea, nausea and vomiting.  Endocrine: Negative for polydipsia, polyphagia and polyuria.  Genitourinary: Negative for dysuria, frequency, hematuria and urgency.  Musculoskeletal: Negative for back pain and neck stiffness.  Skin: Negative for rash.  Allergic/Immunologic: Negative for immunocompromised state.  Neurological: Negative for syncope, light-headedness and headaches.  Hematological: Does not bruise/bleed easily.  Psychiatric/Behavioral: Negative for sleep disturbance. The patient is not nervous/anxious.      Physical Exam Updated Vital Signs BP (!) 162/91   Pulse 63   Temp 98.5 F (36.9 C) (Oral)   Resp 18   Ht 5\' 11"  (1.803 m)   Wt 95.3 kg   SpO2 100%   BMI 29.29 kg/m   Physical Exam Vitals signs and nursing note reviewed.  Constitutional:      General: She is not in acute distress.    Appearance: She is not diaphoretic.  HENT:     Head: Normocephalic.  Eyes:     General: No scleral icterus.    Conjunctiva/sclera: Conjunctivae normal.  Neck:     Musculoskeletal: Normal range of motion.     Comments: Handling secretions without difficulty.  Normal phonation Cardiovascular:     Rate and Rhythm: Normal rate and regular rhythm.     Pulses: Normal pulses.          Radial pulses are 2+ on  the right side and 2+ on the left side.  Pulmonary:     Effort: No tachypnea, accessory muscle usage, prolonged expiration, respiratory distress or retractions.     Breath sounds: No stridor.     Comments: Equal chest rise. No increased work of breathing. Abdominal:     General: There is no distension.     Palpations: Abdomen is soft.     Tenderness: There is no abdominal tenderness. There is no guarding or rebound.  Musculoskeletal:     Comments: Moves all extremities equally and without difficulty.  Skin:    General: Skin is warm and dry.     Capillary Refill: Capillary refill takes less than 2 seconds.  Neurological:     Mental Status: She is alert.     GCS: GCS eye subscore is 4. GCS verbal subscore is 5. GCS motor subscore is 6.     Comments: Speech is clear and goal oriented.  Psychiatric:        Mood and Affect: Mood normal.      ED Treatments / Results  Labs (all labs ordered are listed, but only abnormal results are displayed) Labs Reviewed  BASIC METABOLIC PANEL - Abnormal; Notable for the following components:      Result Value   Glucose, Bld 124 (*)    Creatinine, Ser 1.33 (*)    GFR calc non Af Amer 44 (*)    GFR calc Af Amer 51 (*)    All other components within normal limits  CBC - Abnormal; Notable for the following components:   RBC 5.50 (*)    MCV 72.7 (*)    MCH 22.4 (*)    RDW 17.2 (*)    All other components within normal limits  I-STAT BETA HCG BLOOD, ED ([facility], [facility], [facility] ONLY)  TROPONIN I (HIGH SENSITIVITY)  TROPONIN I (HIGH SENSITIVITY)    EKG EKG Interpretation  Date/Time:  Sunday June 26 2019 20:14:21 EDT Ventricular Rate:  78 PR Interval:  146 QRS Duration: 78 QT Interval:  380 QTC Calculation: 433  R Axis:   48 Text Interpretation:  Normal sinus rhythm Possible Left atrial enlargement Abnormal ECG Confirmed by Elnora Morrison [phone]) on 06/26/2019 11:19:17 PM    Radiology Dg Chest 2 View  Result Date: 06/26/2019 CLINICAL DATA:   Chest pain, shortness of breath EXAM: CHEST - 2 VIEW COMPARISON:  Radiograph 05/30/2017, CT 03/06/2017 FINDINGS: No consolidation, features of edema, pneumothorax, or effusion. Pulmonary vascularity is normally distributed. The cardiomediastinal contours are unremarkable. No acute osseous or soft tissue abnormality. IMPRESSION: No acute cardiopulmonary abnormality. Electronically Signed   By: Lovena Le M.D.   On: 06/26/2019 20:47    Procedures Procedures (including critical care time)  Medications Ordered in ED Medications  sodium chloride flush (NS) 0.9 % injection 3 mL (has no administration in time range)  pantoprazole (PROTONIX) EC tablet 40 mg (has no administration in time range)  alum & mag hydroxide-simeth (MAALOX/MYLANTA) 200-200-20 MG/5ML suspension 30 mL (30 mLs Oral Given 06/26/19 2330)    And  lidocaine (XYLOCAINE) 2 % viscous mouth solution 15 mL (15 mLs Oral Given 06/26/19 2330)     Initial Impression / Assessment and Plan / ED Course  I have reviewed the triage vital signs and the nursing notes.  Pertinent labs & imaging results that were available during my care of the patient were reviewed by me and considered in my medical decision making (see chart for details).  Clinical Course as of Jun 27 7  Sun Jun 26, 2019  2218 Echo 05/2017 Study Conclusions:  - Left ventricle: The cavity size was normal. There was moderate concentric hypertrophy. Systolic function was vigorous. The estimated ejection fraction was in the range of 65% to 70%. Wall motion was normal; there were no regional wall motion abnormalities. There was an increased relative contribution of atrial contraction to ventricular filling. Doppler parameters are consistent with abnormal left ventricular relaxation (grade 1 diastolic dysfunction). - Aortic valve: Trileaflet; mildly thickened, mildly calcified leaflets. - Mitral valve: There was mild regurgitation. - Pulmonic valve: There was trivial  regurgitation. - Pulmonary arteries: PA peak pressure: 31 mm Hg (S).   [HM]  2220 neg  Troponin I (High Sensitivity): 4 [HM]  2220 WNL  Hemoglobin: 12.3 [HM]  2220 Elevated above previous of 1.13  Creatinine(!): 1.33 [HM]  2220 HTN  BP(!): 162/91 [HM]  2238 Pain 7/10.  No vomiting   [HM]    Clinical Course User Index [HM] Jadelin Eng, Gwenlyn Perking    HEAR Score: 1  Patient presents with symptoms of food bolus.  She continues to have pain in her central chest but is able to drink water without difficulty.  Chest x-ray without evidence of free air in her mediastinum or Boerhaave's.  No previous episode of food bolus or impaction.  Previous cardiac work-up within normal limits.  Initial troponin negative.  EKG nonischemic.  Highly doubt acute coronary syndrome as the cause of her chest pain.  Patient discussed with Dr. Lyndel Safe of gastroenterology.  He feels that given her symptoms and ability to tolerate p.o. it is less likely that she has a persistently impacted food bolus.  Patient had complete resolution of her pain after GI cocktail.  Dr. Lyndel Safe recommends barium swallow tomorrow.  Order has been placed.  He will see her in his office on Tuesday and follow-up.  He did recommend home with Protonix.  Findings and plan discussed with patient and family.  Also discussed reasons to return immediately to the emergency department.  They state understanding and are in agreement  with this plan.  Final Clinical Impressions(s) / ED Diagnoses   Final diagnoses:  Central chest pain    ED Discharge Orders         Ordered    DG ESOPHAGUS W SINGLE CM (SOL OR THIN BA)     06/27/19 0002    pantoprazole (PROTONIX) 20 MG tablet  Daily     06/26/19 2355           Tristina Sahagian, Gwenlyn Perking 06/27/19 0009    Elnora Morrison, MD 06/28/19 [phone]

## 2019-06-27 ENCOUNTER — Telehealth: Payer: Self-pay

## 2019-06-27 NOTE — Telephone Encounter (Signed)
ERROR

## 2019-06-27 NOTE — Telephone Encounter (Signed)
patient returned call to the office-patient reports she is ready to schedule an OV to discuss the results of the swallowing eval that was ordered by the [facility] ER; patient has been scheduled for a follow up post ER visit with Dr. Lyndel Safe on 07/08/2019 at 4:00 pm; patient given the number to call to schedule her barium swallow test; RN called radiology scheduling to see if they would also be able to get in touch with the patient to schedule this appt as well; they report they will call the patient to schedule this test;   Patient advised to call back to the office should questions/concerns arise;

## 2019-06-27 NOTE — ED Notes (Signed)
Patient verbalizes understanding of discharge instructions. Opportunity for questioning and answers were provided. Armband removed by staff, pt discharged from ED.  

## 2019-06-27 NOTE — Telephone Encounter (Signed)
Called and spoke with patient-patient informed of MD recommendations of: "Was in ED last night  Need to be seen by me on Tuesday PM.  Getting Ba swallow done today. Pl follow the results"  Patient has not had the barium swallow done as of 06/27/2019 at 0915-as patient is at work; patient refused to make a follow up appt at this time as she does not know her schedule and has requested to call the office back during her lunch break to schedule a follow up appt; patient advised to call back if questions/concerns arise, symptoms return/worsen, and to schedule a f/u appt; patient verbalized understanding of information/instructions;

## 2019-06-27 NOTE — Discharge Instructions (Addendum)
Today you were evaluated for chest pain after eating food.  As you are able to eat, drink and swallow it is less likely that you have food still stuck in your esophagus.  We talked to Dr. Lyndel Safe of gastroenterology who recommends barium swallow scheduled for tomorrow.  Please call [phone] after 7 AM to schedule your barium swallow.  Please also call Dr. Steve Rattler office, listed below to schedule an appointment for Tuesday for further evaluation.  You will be prescribed Protonix.  Please take this as directed.  Return to the emergency department for fevers, worsening pain, vomiting, inability to swallow or any other concerns.

## 2019-07-01 ENCOUNTER — Encounter (HOSPITAL_BASED_OUTPATIENT_CLINIC_OR_DEPARTMENT_OTHER): Payer: Self-pay | Admitting: Emergency Medicine

## 2019-07-01 ENCOUNTER — Other Ambulatory Visit: Payer: Self-pay

## 2019-07-01 ENCOUNTER — Emergency Department (HOSPITAL_BASED_OUTPATIENT_CLINIC_OR_DEPARTMENT_OTHER)
Admission: EM | Admit: 2019-07-01 | Discharge: 2019-07-01 | Discharge status: Against Medical Advice | Payer: 59 | Attending: Emergency Medicine | Admitting: Emergency Medicine

## 2019-07-01 DIAGNOSIS — R030 Elevated blood-pressure reading, without diagnosis of hypertension: Secondary | ICD-10-CM | POA: Diagnosis not present

## 2019-07-01 DIAGNOSIS — Z85038 Personal history of other malignant neoplasm of large intestine: Secondary | ICD-10-CM | POA: Diagnosis not present

## 2019-07-01 DIAGNOSIS — Z79899 Other long term (current) drug therapy: Secondary | ICD-10-CM | POA: Insufficient documentation

## 2019-07-01 DIAGNOSIS — Z5329 Procedure and treatment not carried out because of patient's decision for other reasons: Secondary | ICD-10-CM | POA: Diagnosis not present

## 2019-07-01 DIAGNOSIS — K0889 Other specified disorders of teeth and supporting structures: Secondary | ICD-10-CM | POA: Insufficient documentation

## 2019-07-01 DIAGNOSIS — R220 Localized swelling, mass and lump, head: Secondary | ICD-10-CM

## 2019-07-01 DIAGNOSIS — D1809 Hemangioma of other sites: Secondary | ICD-10-CM | POA: Diagnosis not present

## 2019-07-01 NOTE — ED Triage Notes (Signed)
Swelling and pain to upper gums since yesterday.  Pt has upper dentures.

## 2019-07-01 NOTE — ED Notes (Signed)
According to PA, pt did not want any further treatment and eloped.  Pt requesting narcotics.

## 2019-07-01 NOTE — ED Provider Notes (Signed)
[facility] EMERGENCY DEPARTMENT Provider Note   CSN: [id number] Arrival date & time: 07/01/19  1554     History   Chief Complaint Chief Complaint  Patient presents with  . Oral Swelling    HPI Mary [name] is a 58 y.o. female presents to the ED today complaining of gradual onset, constant, achy, pain, swelling to her upper gums that began yesterday.  Patient has her dentures that were placed about 2 months ago.  Ports no issues until yesterday.  Denies eating anything out of the ordinary to irritate her gums.  She has been taking 800 mg ibuprofen without relief.  Denies fever, chills, drainage, any other associated symptoms.  She reports that she called her dentist but they could not see her until next Wednesday.  Says she came to the ED because she is in such severe pain.        Past Medical History:  Diagnosis Date  . Adenocarcinoma sigmoid colon 05/12/11   Stage III(pT2pN2b) descending sigmoid  . Arm DVT (deep venous thromboembolism), acute ([place]) 07/04/11   r/t PAC  . Blood transfusion   . Cancer of descending colon ([place]) 05/12/2011  . GERD (gastroesophageal reflux disease)   . History of migraine headaches   . Incisional hernia   . iron deficiency anemia   . Multiple hemangiomas    Liver  . Neuropathy due to drug ([place]) 2012   Early--assoc. w/prolonged cold sensitivity--Oxaliplatin related  . Pneumothorax 07/19/11   Bilateral  . Renal cysts, acquired, bilateral     Patient Active Problem List   Diagnosis Date Noted  . Labial cyst 03/05/2018  . Chest pain 05/30/2017  . Chronic nonintractable headache 05/04/2017  . Ankle edema, bilateral 05/04/2017  . Cellulitis 04/04/2016  . Abnormal x-ray 04/04/2016  . Hearing loss due to cerumen impaction 01/14/2016  . Impaired fasting blood sugar 01/14/2016  . Plantar fasciitis, right 11/22/2015  . Right calcaneal fracture 08/15/2015  . OSA (obstructive sleep apnea) 09/26/2014  . Peripheral neuropathy due to  chemotherapy ([place]) 06/05/2014  . Obesity 06/05/2014  . history of DVT (deep venous thrombosis) 02/20/2014  . GERD (gastroesophageal reflux disease) 02/20/2014  . Hernia 09/22/2012  . Iron deficiency anemia, unspecified 08/30/2011  . Colon cancer ([place]) 08/29/2011  . Cancer of descending colon ([place]) 05/12/2011    Past Surgical History:  Procedure Laterality Date  . ABDOMINAL HYSTERECTOMY  mid 20s   bleeding  . COLON SURGERY  05/17/11   Partial colectomy  . HERNIA REPAIR  09/06/12   lap incisional hernia  . INSERTION OF MESH  09/06/2012   Procedure: INSERTION OF MESH;  Surgeon: Gwenyth Ober, MD;  Location: [facility];  Service: General;  Laterality: N/A;  . PORT-A-CATH REMOVAL  05/06/2012   Procedure: MINOR REMOVAL PORT-A-CATH;  Surgeon: Gwenyth Ober, MD;  Location: [facility];  Service: General;  Laterality: Right;  . PORTACATH PLACEMENT  07/20/11   Right side placed  . PORTACATH PLACEMENT     left side & right side  . VENTRAL HERNIA REPAIR  09/06/2012   Procedure: LAPAROSCOPIC VENTRAL HERNIA;  Surgeon: Gwenyth Ober, MD;  Location: [facility];  Service: General;  Laterality: N/A;     OB History    Gravida  5   Para  3   Term      Preterm      AB  2   Living  3     SAB  2   TAB  Ectopic      Multiple      Live Births               Home Medications    Prior to Admission medications   Medication Sig Start Date End Date Taking? Authorizing Provider  Elastic Bandages & Supports (MEDICAL COMPRESSION STOCKINGS) MISC 1 Units by Does not apply route daily. Knee high, moderate compression 05/04/17   Nche, Charlene Brooke, NP  erythromycin ophthalmic ointment Apply to RT upper eyelid up to 6 times daily for 5-7 days, Patient not taking: Reported on 06/26/2019 01/09/19   Wurst, [place], PA-C  NON FORMULARY Take 1 capsule by mouth See admin instructions. "Keto Cuts PM Burn Fast Instead of Carbs Ketogenic Sleep Formula for Weight Loss" capsules: Take 1 capsule by  mouth at bedtime as needed to aid with desired weight loss    [provider]  pantoprazole (PROTONIX) 20 MG tablet Take 1 tablet (20 mg total) by mouth daily. 06/26/19   Muthersbaugh, Jarrett Soho, PA-C  polyethylene glycol (MIRALAX / GLYCOLAX) packet Take 17 g by mouth daily. Patient not taking: Reported on 06/26/2019 02/27/14   Verlee Monte, MD  potassium chloride SA (K-DUR,KLOR-CON) 20 MEQ tablet Take 20 mEq by mouth daily. 11/25/11 01/20/12  Owens Shark, NP  prochlorperazine (COMPAZINE) 10 MG tablet Take 10 mg by mouth every 6 (six) hours as needed. For nausea/vomiting.  01/18/12  Ladell Pier, MD    Family History Family History  Problem Relation Age of Onset  . Diabetes Mother   . Hypertension Mother   . Cancer Father        leukemia  . Cancer Maternal Uncle        lung  . Cancer Maternal Uncle        colon  . Cancer Maternal Uncle        prostate  . Cancer Brother        PROSTATE  . Other Brother        MVA    Social History Social History   Tobacco Use  . Smoking status: Never Smoker  . Smokeless tobacco: Never Used  Substance Use Topics  . Alcohol use: No    Alcohol/week: 0.0 standard drinks  . Drug use: No     Allergies   Patient has no known allergies.   Review of Systems Review of Systems  Constitutional: Negative for chills and fever.  HENT: Positive for dental problem (swelling/pain to upper gums). Negative for facial swelling, sore throat and trouble swallowing.      Physical Exam Updated Vital Signs BP (!) 212/109 (BP Location: Right Arm)   Pulse 71   Temp 98.2 F (36.8 C) (Oral)   Resp 18   Ht 5\' 11"  (1.803 m)   Wt 108.1 kg   SpO2 100%   BMI 33.25 kg/m   Physical Exam Vitals signs and nursing note reviewed.  Constitutional:      Appearance: She is not ill-appearing.  HENT:     Head: Normocephalic and atraumatic.     Comments: Dentures removed from upper gumline. Mild amount of erythema and edema noted to frontal gums with  very mild tenderness to palpation. No obvious abscess. No facial swelling. No ludwig's angina. Oropharynx clear and moist without erythema, edema, or exudate. Airway intact.  Eyes:     Conjunctiva/sclera: Conjunctivae normal.  Cardiovascular:     Rate and Rhythm: Normal rate and regular rhythm.  Pulmonary:     Effort: Pulmonary effort is  normal.     Breath sounds: Normal breath sounds.  Skin:    General: Skin is warm and dry.     Coloration: Skin is not jaundiced.  Neurological:     Mental Status: She is alert.      ED Treatments / Results  Labs (all labs ordered are listed, but only abnormal results are displayed) Labs Reviewed - No data to display  EKG None  Radiology No results found.  Procedures Procedures (including critical care time)  Medications Ordered in ED Medications - No data to display   Initial Impression / Assessment and Plan / ED Course  I have reviewed the triage vital signs and the nursing notes.  Pertinent labs & imaging results that were available during my care of the patient were reviewed by me and considered in my medical decision making (see chart for details).    58 year old female who presents to the ED complaining of upper gum swelling and pain for the last day.  Has upper dentures that were placed about 2 months ago without complication.  No obvious trauma.  No noted new foods/irritation prior to pain beginning.  Taking 800 mg ibuprofen without relief.  Has an appointment with her dentist next Wednesday.  Afebrile in the ED without tachycardia or tachypnea.  Patient is hypertensive on arrival.  She states she does not take anything for high blood pressure.  No obvious signs of infection to gumline.  She has very mild tenderness to palpation.  When advising on taking ibuprofen and Tylenol as needed for pain patient became upset and states she needs something stronger.  Do not feel she needs narcotic pain medication at this time.  Patient became  upset that I was not giving her anything stronger and decided to elope.      Final Clinical Impressions(s) / ED Diagnoses   Final diagnoses:  Swelling of gums    ED Discharge Orders    None       Eustaquio Maize, PA-C 07/01/19 2219    Malvin Johns, MD 07/01/19 2222

## 2019-07-06 ENCOUNTER — Other Ambulatory Visit: Payer: Self-pay

## 2019-07-06 ENCOUNTER — Ambulatory Visit (HOSPITAL_COMMUNITY)
Admission: RE | Admit: 2019-07-06 | Discharge: 2019-07-06 | Disposition: A | Discharge status: Home / Self Care | Payer: 59 | Source: Ambulatory Visit | Attending: Physician Assistant | Admitting: Physician Assistant

## 2019-07-06 ENCOUNTER — Other Ambulatory Visit (HOSPITAL_COMMUNITY): Payer: Self-pay | Admitting: Physician Assistant

## 2019-07-06 DIAGNOSIS — R079 Chest pain, unspecified: Secondary | ICD-10-CM | POA: Diagnosis not present

## 2019-07-08 ENCOUNTER — Ambulatory Visit: Payer: 59 | Admitting: Gastroenterology

## 2019-07-27 ENCOUNTER — Other Ambulatory Visit: Payer: Self-pay | Admitting: Internal Medicine

## 2019-07-27 ENCOUNTER — Ambulatory Visit: Payer: 59 | Admitting: Gastroenterology

## 2019-07-27 ENCOUNTER — Encounter: Payer: Self-pay | Admitting: Gastroenterology

## 2019-07-27 ENCOUNTER — Other Ambulatory Visit: Payer: Self-pay

## 2019-07-27 ENCOUNTER — Telehealth: Payer: Self-pay | Admitting: Gastroenterology

## 2019-07-27 VITALS — BP 136/88 | HR 70 | Temp 97.6°F | Ht 71.0 in | Wt 240.2 lb

## 2019-07-27 DIAGNOSIS — R1310 Dysphagia, unspecified: Secondary | ICD-10-CM

## 2019-07-27 DIAGNOSIS — Z85038 Personal history of other malignant neoplasm of large intestine: Secondary | ICD-10-CM | POA: Diagnosis not present

## 2019-07-27 DIAGNOSIS — K219 Gastro-esophageal reflux disease without esophagitis: Secondary | ICD-10-CM

## 2019-07-27 DIAGNOSIS — R1319 Other dysphagia: Secondary | ICD-10-CM

## 2019-07-27 MED ORDER — PANTOPRAZOLE SODIUM 40 MG PO TBEC: 40.0000 mg | DELAYED_RELEASE_TABLET | Freq: Every day | ORAL | 11 refills | Status: DC

## 2019-07-27 MED ORDER — SUPREP BOWEL PREP KIT 17.5-3.13-1.6 GM/177ML PO SOLN: 1.0000 | ORAL | 0 refills | Status: DC

## 2019-07-27 NOTE — Progress Notes (Signed)
Chief Complaint: Dysphagia  Referring Provider:  Hoyt Koch, *      ASSESSMENT AND PLAN;   #1. Eso Dysphagia with GERD with Ba Swallow showing small HH with eso dysmotility. H/O S/O food impaction s/p spontaneous disimpaction 06/2019.  #2.  H/O Colon cancer s/p sigmoid resection followed by neoadjuvant chemo. (Stage III) 05/2011. Last colon 08/2014 (Dr Collene Mares)- neg. CEA 06/2017: 2.3 (Nl)  Plan: - Proceed with EGD with dil/colon. Discussed risks & benefits. (Risks including rare perforation req laparotomy, bleeding after biopsies/polypectomy req blood transfusion, rare chance of missing neoplasms, risks of anesthesia/sedation). Benefits outweigh the risks. Patient agrees to proceed. All the questions were answered. Consent forms given for review. -Therafter she will make FU appt with her oncologist.  She would require repeat CEA level and possibly CT Abdo/pelvis.  She told me that she will take care of it. I have told her that we would be more than happy to arrange. - Protonix 40mg  po QD, 30, 11 refills.    HPI:    Mary [name] is a 58 y.o. female  "Choked on nectarine" Then started having severe chest pain 07/01/2019. Seen in ED. she was able to tolerate p.o. and was sent home.  Had a negative chest x-ray 06/2019 and neg CT chest 02/2017.  Ba swallow 07/06/2019- small HH, mild eso dysmotility.  Comes to the GI clinic for further evaluation.  Occasional dysphagia mostly to solids.  Chest pain is much better.  She has longstanding history of intermittent heartburn.  Denies having any regurgitation, odynophagia, weight loss, melena or hematochezia.  \Never had EGD.    No significant diarrhea or constipation.  There is no melena or hematochezia.  No abdominal pain.  She is due for colonoscopy as well.  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 Adenocarcinoma sigmoid colon 05/12/11   Stage III(pT2pN2b) descending sigmoid   . Arm DVT (deep venous thromboembolism), acute ([place]) 07/04/11   r/t PAC  . Blood transfusion   . Cancer of descending colon ([place]) 05/12/2011  . GERD (gastroesophageal reflux disease)   . History of migraine headaches   . Incisional hernia   . iron deficiency anemia   . Multiple hemangiomas    Liver  . 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 w/prolonged cold sensitivity--Oxaliplatin related  . Pneumothorax 07/19/11   Bilateral  . Renal cysts, acquired, bilateral     Past Surgical History:  Procedure Laterality Date  . ABDOMINAL HYSTERECTOMY  mid 20s   bleeding  . COLON SURGERY  05/17/11   Partial colectomy  . HERNIA REPAIR  09/06/12   lap incisional hernia  . INSERTION OF MESH  09/06/2012   Procedure: INSERTION OF MESH;  Surgeon: Gwenyth Ober, MD;  Location: [facility];  Service: General;  Laterality: N/A;  . PORT-A-CATH REMOVAL  05/06/2012   Procedure: MINOR REMOVAL PORT-A-CATH;  Surgeon: Gwenyth Ober, MD;  Location: [facility];  Service: General;  Laterality: Right;  . PORTACATH PLACEMENT  07/20/11   Right side placed  . PORTACATH PLACEMENT     left side & right side  . VENTRAL HERNIA REPAIR  09/06/2012   Procedure: LAPAROSCOPIC VENTRAL HERNIA;  Surgeon: Gwenyth Ober, MD;  Location: [facility];  Service: General;  Laterality: N/A;    Family History  Problem Relation Age of Onset  . Diabetes Mother   . Hypertension Mother   .  Cancer Father        leukemia  . Cancer Maternal Uncle        lung  . Cancer Maternal Uncle        colon  . Cancer Maternal Uncle        prostate  . Cancer Brother        PROSTATE  . Other Brother        MVA    Social History   Tobacco Use  . Smoking status: Never Smoker  . Smokeless tobacco: Never Used  Substance Use Topics  . Alcohol use: No    Alcohol/week: 0.0 standard drinks  . Drug use: No    Current Outpatient Medications  Medication Sig Dispense Refill  . Elastic Bandages & Supports (MEDICAL COMPRESSION  STOCKINGS) MISC 1 Units by Does not apply route daily. Knee high, moderate compression 2 each 0  . erythromycin ophthalmic ointment Apply to RT upper eyelid up to 6 times daily for 5-7 days, 3.5 g 0  . NON FORMULARY Take 1 capsule by mouth See admin instructions. "Keto Cuts PM Burn Fast Instead of Carbs Ketogenic Sleep Formula for Weight Loss" capsules: Take 1 capsule by mouth at bedtime as needed to aid with desired weight loss    . pantoprazole (PROTONIX) 20 MG tablet Take 1 tablet (20 mg total) by mouth daily. 30 tablet 0  . polyethylene glycol (MIRALAX / GLYCOLAX) packet Take 17 g by mouth daily. 14 each 0   No current facility-administered medications for this visit.     No Known Allergies  Review of Systems:  Constitutional: Denies fever, chills, diaphoresis, appetite change and fatigue.  HEENT: Denies photophobia, eye pain, redness, hearing loss, ear pain, congestion, sore throat, rhinorrhea, sneezing, mouth sores, neck pain, neck stiffness and tinnitus.   Respiratory: Denies SOB, DOE, cough, chest tightness,  and wheezing.   Cardiovascular: Denies chest pain, palpitations and leg swelling.  Genitourinary: Denies dysuria, urgency, frequency, hematuria, flank pain and difficulty urinating.  Musculoskeletal: Denies myalgias, back pain, joint swelling, arthralgias and gait problem.  Skin: No rash.  Neurological: Denies dizziness, seizures, syncope, weakness, light-headedness, numbness and headaches.  Hematological: Denies adenopathy. Easy bruising, personal or family bleeding history  Psychiatric/Behavioral: No anxiety or depression     Physical Exam:    BP 136/88   Pulse 70   Temp 97.6 F (36.4 C)   Ht 5\' 11"  (1.803 m)   Wt 240 lb 4 oz (109 kg)   SpO2 98%   BMI 33.51 kg/m  Filed Weights   07/27/19 0940  Weight: 240 lb 4 oz (109 kg)   Constitutional:  Well-developed, in no acute distress. Psychiatric: Normal mood and affect. Behavior is normal. HEENT: Pupils normal.   Conjunctivae are normal. No scleral icterus. Neck supple.  Cardiovascular: Normal rate, regular rhythm. No edema Pulmonary/chest: Effort normal and breath sounds normal. No wheezing, rales or rhonchi. Abdominal: Soft, nondistended. Nontender. Bowel sounds active throughout. There are no masses palpable. No hepatomegaly. Rectal:  defered Neurological: Alert and oriented to person place and time. Skin: Skin is warm and dry. No rashes noted.  Data Reviewed: I have personally reviewed following labs and imaging studies  CBC: CBC Latest Ref Rng & Units 06/26/2019 09/17/2018 03/05/2018  WBC 4.0 - 10.5 K/uL 7.7 6.3 7.2  Hemoglobin 12.0 - 15.0 g/dL 12.3 12.1 11.8(L)  Hematocrit 36.0 - 46.0 % 40.0 37.9 37.7  Platelets 150 - 400 K/uL 191 191.0 204.0    CMP: CMP Latest Ref Rng &  Units 06/26/2019 09/17/2018 09/17/2018  Glucose 70 - 99 mg/dL 124(H) - 107(H)  BUN 6 - 20 mg/dL 15 - 17  Creatinine 0.44 - 1.00 mg/dL 1.33(H) - 1.13  Sodium 135 - 145 mmol/L 139 - 139  Potassium 3.5 - 5.1 mmol/L 3.9 - 3.8  Chloride 98 - 111 mmol/L 106 - 105  CO2 22 - 32 mmol/L 24 - 28  Calcium 8.9 - 10.3 mg/dL 9.3 9.3 9.1  Total Protein 6.0 - 8.3 g/dL - - 7.2  Total Bilirubin 0.2 - 1.2 mg/dL - - 0.4  Alkaline Phos 39 - 117 U/L - - 89  AST 0 - 37 U/L - - 16  ALT 0 - 35 U/L - - 19     Radiology Studies: Dg Esophagus W Double Cm (hd)  Result Date: 07/06/2019 CLINICAL DATA:  Chest pain. Feeling of food impaction. History of stage III colon cancer in 2012. EXAM: ESOPHOGRAM / BARIUM SWALLOW / BARIUM TABLET STUDY TECHNIQUE: Combined double contrast and single contrast examination performed using effervescent crystals, thick barium liquid, and thin barium liquid. The patient was observed with fluoroscopy swallowing a 13 mm barium sulphate tablet. FLUOROSCOPY TIME:  Fluoroscopy Time:  2 minutes and 12 seconds Radiation Exposure Index (if provided by the fluoroscopic device): Not applicable. Number of Acquired Spot Images: 0  COMPARISON:  Chest radiograph 06/26/2019.  Chest CT 03/06/2017. FINDINGS: Hypopharyngeal portion of the exam is unremarkable. Double contrast evaluation of the esophagus demonstrates no mucosal abnormality. Evaluation of esophageal primary peristalsis demonstrates minimal proximal escape waves and contrast stasis in the upper esophagus, including on series 5. Full column evaluation of the esophagus demonstrates a tiny hiatal hernia. No persistent narrowing or stricture. A 13 mm barium tablet passes promptly. IMPRESSION: 1. Mild esophageal dysmotility, likely early presbyesophagus. 2. Tiny hiatal hernia. Electronically Signed   By: Abigail Miyamoto M.D.   On: 07/06/2019 09:51      Carmell Austria, MD 07/27/2019, 9:52 AM  Cc: Hoyt Koch, *

## 2019-07-27 NOTE — Patient Instructions (Addendum)
If you are age 58 or older, your body mass index should be between 23-30. Your Body mass index is 33.51 kg/m. If this is out of the aforementioned range listed, please consider follow up with your Primary Care Provider.  If you are age 52 or younger, your body mass index should be between 19-25. Your Body mass index is 33.51 kg/m. If this is out of the aformentioned range listed, please consider follow up with your Primary Care Provider.   You have been scheduled for an endoscopy and colonoscopy. Please follow the written instructions given to you at your visit today. Please pick up your prep supplies at the pharmacy within the next 1-3 days. If you use inhalers (even only as needed), please bring them with you on the day of your procedure. Your physician has requested that you go to www.startemmi.com and enter the access code given to you at your visit today. This web site gives a general overview about your procedure. However, you should still follow specific instructions given to you by our office regarding your preparation for the procedure.  We have sent the following medications to your pharmacy for you to pick up at your convenience: Suprep Protonix  Thank you,  Dr. Jackquline Denmark

## 2019-07-27 NOTE — Telephone Encounter (Signed)
Called and spoke with patient-patient informed office that Suprep was too expensive for her to purchase; patient advised that the office should be getting samples and she would be contacted to come pick up the sample as soon as they are delivered; patient verbalized understanding of information/instructions; patient advised to call back to the office should questions/concerns arise;

## 2019-07-27 NOTE — Telephone Encounter (Signed)
Medication Refill - Medication: topamax, lidocaine patches. Not on current medication list.

## 2019-07-27 NOTE — Telephone Encounter (Signed)
Medication Refill - Medication: topamax, lidocaine patches  Has the patient contacted their pharmacy? Yes.   These medications are not currently on pts medication list. Please advise.  (Agent: If no, request that the patient contact the pharmacy for the refill.) (Agent: If yes, when and what did the pharmacy advise?)  Preferred Pharmacy (with phone number or street name):  [facility] ([address]), [place] - [address]  [id number] [place]) [place] [address]  Phone: [phone] Fax: [phone]  Not a 24 hour pharmacy; exact hours not known.     Agent: Please be advised that RX refills may take up to 3 business days. We ask that you follow-up with your pharmacy.

## 2019-07-27 NOTE — Telephone Encounter (Signed)
Needs OV. LOV with PCP was 03/05/18.

## 2019-07-28 ENCOUNTER — Encounter: Payer: Self-pay | Admitting: Gastroenterology

## 2019-07-28 NOTE — Telephone Encounter (Signed)
Pt called stating that she will p/u prep before 4:30pm today.

## 2019-07-28 NOTE — Telephone Encounter (Signed)
Left message for patient to call back to schedule an appointment for medication refills. 

## 2019-07-28 NOTE — Telephone Encounter (Signed)
Suprep sample placed at front desk for patient to pick up;  Left message for patient to pick up prep kit from front office;

## 2019-08-09 ENCOUNTER — Encounter: Payer: Self-pay | Admitting: Gynecology

## 2019-08-10 ENCOUNTER — Telehealth: Payer: Self-pay | Admitting: Gastroenterology

## 2019-08-10 NOTE — Telephone Encounter (Signed)
Pt responded "no" to all screening questions °

## 2019-08-10 NOTE — Telephone Encounter (Signed)
°

## 2019-08-11 ENCOUNTER — Encounter: Payer: Self-pay | Admitting: Gastroenterology

## 2019-08-11 ENCOUNTER — Other Ambulatory Visit: Payer: Self-pay

## 2019-08-11 ENCOUNTER — Ambulatory Visit (AMBULATORY_SURGERY_CENTER): Payer: 59 | Admitting: Gastroenterology

## 2019-08-11 VITALS — BP 123/84 | HR 71 | Temp 98.2°F | Resp 15 | Ht 71.0 in | Wt 240.0 lb

## 2019-08-11 DIAGNOSIS — D123 Benign neoplasm of transverse colon: Secondary | ICD-10-CM

## 2019-08-11 DIAGNOSIS — K449 Diaphragmatic hernia without obstruction or gangrene: Secondary | ICD-10-CM

## 2019-08-11 DIAGNOSIS — K222 Esophageal obstruction: Secondary | ICD-10-CM

## 2019-08-11 DIAGNOSIS — K219 Gastro-esophageal reflux disease without esophagitis: Secondary | ICD-10-CM | POA: Diagnosis not present

## 2019-08-11 DIAGNOSIS — R1310 Dysphagia, unspecified: Secondary | ICD-10-CM

## 2019-08-11 DIAGNOSIS — Z85038 Personal history of other malignant neoplasm of large intestine: Secondary | ICD-10-CM | POA: Diagnosis not present

## 2019-08-11 DIAGNOSIS — K2970 Gastritis, unspecified, without bleeding: Secondary | ICD-10-CM

## 2019-08-11 MED ORDER — SODIUM CHLORIDE 0.9 % IV SOLN: 500.0000 mL | Freq: Once | INTRAVENOUS | Status: DC

## 2019-08-11 NOTE — Op Note (Signed)
[facility] Patient Name: Mary [name] Procedure Date: 08/11/2019 2:15 PM MRN: [id number] Endoscopist: Jackquline Denmark , MD Age: 58 Referring MD:  Date of Birth: 01-23-1961 Gender: Female Account #: [id number] Procedure:                Upper GI endoscopy Indications:              Dysphagia Medicines:                Monitored Anesthesia Care Procedure:                Pre-Anesthesia Assessment:                           - Prior to the procedure, a History and Physical                            was performed, and patient medications and                            allergies were reviewed. The patient's tolerance of                            previous anesthesia was also reviewed. The risks                            and benefits of the procedure and the sedation                            options and risks were discussed with the patient.                            All questions were answered, and informed consent                            was obtained. Prior Anticoagulants: The patient has                            taken no previous anticoagulant or antiplatelet                            agents. ASA Grade Assessment: II - A patient with                            mild systemic disease. After reviewing the risks                            and benefits, the patient was deemed in                            satisfactory condition to undergo the procedure.                           - Prior to the procedure, a History and Physical  was performed, and patient medications and                            allergies were reviewed. The patient's tolerance of                            previous anesthesia was also reviewed. The risks                            and benefits of the procedure and the sedation                            options and risks were discussed with the patient.                            All questions were answered, and informed consent                        was obtained. Prior Anticoagulants: The patient has                            taken no previous anticoagulant or antiplatelet                            agents. ASA Grade Assessment: II - A patient with                            mild systemic disease. After reviewing the risks                            and benefits, the patient was deemed in                            satisfactory condition to undergo the procedure.                           After obtaining informed consent, the endoscope was                            passed under direct vision. Throughout the                            procedure, the patient's blood pressure, pulse, and                            oxygen saturations were monitored continuously. The                            Endoscope was introduced through the mouth, and                            advanced to the second part of duodenum. The upper  GI endoscopy was accomplished without difficulty.                            The patient tolerated the procedure well. Scope In: Scope Out: Findings:                 Esophagus was mildly tortuous in the distal one                            third of the esophagus but normal. A mild Schatzki                            ring was found at the gastroesophageal junction, 36                            cm from the incisors with luminal diameter of                            approximately 14 mm. The scope was withdrawn.                            Dilation was performed with a Maloney dilator with                            no resistance at 50 Fr and mild resistance at 52                            Fr. Biopsies were obtained from the proximal and                            distal esophagus with cold forceps for histology of                            suspected eosinophilic esophagitis.                           A 2 cm hiatal hernia was present.                           Localized mild  inflammation characterized by                            erythema was found in the gastric antrum. Biopsies                            were taken with a cold forceps for histology.                           The examined duodenum was normal. Complications:            No immediate complications. Estimated Blood Loss:     Estimated blood loss: none. Impression:               - Mild Schatzki ring s/p dilatation                           -  2 cm hiatal hernia.                           - Mild gastritis Recommendation:           - Patient has a contact number available for                            emergencies. The signs and symptoms of potential                            delayed complications were discussed with the                            patient. Return to normal activities tomorrow.                            Written discharge instructions were provided to the                            patient.                           - Postdilatation diet.                           - Continue present medications.                           - Await pathology results.                           - Chew food especially meats and breads well and                            eat slowly.                           - Return to GI clinic PRN. Jackquline Denmark, MD 08/11/2019 2:52:57 PM This report has been signed electronically.

## 2019-08-11 NOTE — Progress Notes (Signed)
Temperature taken by J.B., VS taken by C.W. 

## 2019-08-11 NOTE — Progress Notes (Signed)
Called to room to assist during endoscopic procedure.  Patient ID and intended procedure confirmed with present staff. Received instructions for my participation in the procedure from the performing physician.  

## 2019-08-11 NOTE — Patient Instructions (Signed)
Discharge instructions given. Handouts on polyps,diverticulosis,hemorrhoids,Gastritis and a dilatation diet. Resume previous medications. Chew food especially meats and breads well and eat slowly. YOU HAD AN ENDOSCOPIC PROCEDURE TODAY AT [facility]:   Refer to the procedure report that was given to you for any specific questions about what was found during the examination.  If the procedure report does not answer your questions, please call your gastroenterologist to clarify.  If you requested that your care partner not be given the details of your procedure findings, then the procedure report has been included in a sealed envelope for you to review at your convenience later.  YOU SHOULD EXPECT: Some feelings of bloating in the abdomen. Passage of more gas than usual.  Walking can help get rid of the air that was put into your GI tract during the procedure and reduce the bloating. If you had a lower endoscopy (such as a colonoscopy or flexible sigmoidoscopy) you may notice spotting of blood in your stool or on the toilet paper. If you underwent a bowel prep for your procedure, you may not have a normal bowel movement for a few days.  Please Note:  You might notice some irritation and congestion in your nose or some drainage.  This is from the oxygen used during your procedure.  There is no need for concern and it should clear up in a day or so.  SYMPTOMS TO REPORT IMMEDIATELY:   Following lower endoscopy (colonoscopy or flexible sigmoidoscopy):  Excessive amounts of blood in the stool  Significant tenderness or worsening of abdominal pains  Swelling of the abdomen that is new, acute  Fever of 100F or higher   Following upper endoscopy (EGD)  Vomiting of blood or coffee ground material  New chest pain or pain under the shoulder blades  Painful or persistently difficult swallowing  New shortness of breath  Fever of 100F or higher  Black, tarry-looking stools  For urgent  or emergent issues, a gastroenterologist can be reached at any hour by calling [phone].   DIET:  We do recommend a small meal at first, but then you may proceed to your regular diet.  Drink plenty of fluids but you should avoid alcoholic beverages for 24 hours.  ACTIVITY:  You should plan to take it easy for the rest of today and you should NOT DRIVE or use heavy machinery until tomorrow (because of the sedation medicines used during the test).    FOLLOW UP: Our staff will call the number listed on your records 48-72 hours following your procedure to check on you and address any questions or concerns that you may have regarding the information given to you following your procedure. If we do not reach you, we will leave a message.  We will attempt to reach you two times.  During this call, we will ask if you have developed any symptoms of COVID 19. If you develop any symptoms (ie: fever, flu-like symptoms, shortness of breath, cough etc.) before then, please call [phone].  If you test positive for Covid 19 in the 2 weeks post procedure, please call and report this information to [place].    If any biopsies were taken you will be contacted by phone or by letter within the next 1-3 weeks.  Please call us at [phone] if you have not heard about the biopsies in 3 weeks.    SIGNATURES/CONFIDENTIALITY: You and/or your care partner have signed paperwork which will be entered into your electronic medical  record.  These signatures attest to the fact that that the information above on your After Visit Summary has been reviewed and is understood.  Full responsibility of the confidentiality of this discharge information lies with you and/or your care-partner. 

## 2019-08-11 NOTE — Op Note (Signed)
[facility] Patient Name: Mary [name] Procedure Date: 08/11/2019 2:14 PM MRN: [id number] Endoscopist: Jackquline Denmark , MD Age: 58 Referring MD:  Date of Birth: 11-07-1961 Gender: Female Account #: [id number] Procedure:                Colonoscopy Indications:              H/O Colon cancer s/p sigmoid resection followed by                            neoadjuvant chemo. (Stage III) 05/2011 Medicines:                Monitored Anesthesia Care Procedure:                Pre-Anesthesia Assessment:                           - Prior to the procedure, a History and Physical                            was performed, and patient medications and                            allergies were reviewed. The patient's tolerance of                            previous anesthesia was also reviewed. The risks                            and benefits of the procedure and the sedation                            options and risks were discussed with the patient.                            All questions were answered, and informed consent                            was obtained. Prior Anticoagulants: The patient has                            taken no previous anticoagulant or antiplatelet                            agents. ASA Grade Assessment: II - A patient with                            mild systemic disease. After reviewing the risks                            and benefits, the patient was deemed in                            satisfactory condition to undergo the procedure.  After obtaining informed consent, the colonoscope                            was passed under direct vision. Throughout the                            procedure, the patient's blood pressure, pulse, and                            oxygen saturations were monitored continuously. The                            Colonoscope was introduced through the anus and                            advanced to the 2 cm into  the ileum. The                            colonoscopy was performed without difficulty. The                            patient tolerated the procedure well. The quality                            of the bowel preparation was good. The terminal                            ileum, ileocecal valve, appendiceal orifice, and                            rectum were photographed. Scope In: 2:31:00 PM Scope Out: 2:42:06 PM Scope Withdrawal Time: 0 hours 8 minutes 19 seconds  Total Procedure Duration: 0 hours 11 minutes 6 seconds  Findings:                 A 10 mm polyp was found in the hepatic flexure. The                            polyp was sessile. The polyp was removed with a                            cold snare. Resection and retrieval were complete.                            Estimated blood loss: none.                           Multiple medium-mouthed diverticula were found in                            the neo-sigmoid colon and descending colon.                           Small internal hemorrhoids were noted on the  retroflexed examination.                           End to side colocolonic anastomosis was identified                            at 30 cm consistent with previous sigmoid                            resection. The anastomosis was patent. There was no                            recurrence.                           The terminal ileum appeared normal.                           The exam was otherwise without abnormality on                            direct and retroflexion views. Complications:            No immediate complications. Estimated Blood Loss:     Estimated blood loss: none. Impression:               -Colon polyp S/P polypectomy.                           -Left colonic diverticulosis.                           -Otherwise normal postoperative colonoscopy. Recommendation:           - Patient has a contact number available for                             emergencies. The signs and symptoms of potential                            delayed complications were discussed with the                            patient. Return to normal activities tomorrow.                            Written discharge instructions were provided to the                            patient.                           - Resume previous diet.                           - Continue present medications.                           -  Await pathology results.                           - Repeat colonoscopy for surveillance based on                            pathology results.                           - Return to GI clinic PRN. Jackquline Denmark, MD 08/11/2019 2:48:03 PM This report has been signed electronically.

## 2019-08-11 NOTE — Progress Notes (Signed)
Report to PACU, RN, vss, BBS= Clear.  

## 2019-08-12 ENCOUNTER — Telehealth: Payer: Self-pay | Admitting: *Deleted

## 2019-08-12 NOTE — Telephone Encounter (Signed)
Opened in error

## 2019-08-15 ENCOUNTER — Telehealth: Payer: Self-pay

## 2019-08-15 NOTE — Telephone Encounter (Signed)
First attempt follow up call to pt, LM on VM

## 2019-08-15 NOTE — Telephone Encounter (Signed)
Second follow up call.  No answer, left message.

## 2019-08-15 NOTE — Telephone Encounter (Signed)
Pt stated feeling well

## 2019-08-23 ENCOUNTER — Encounter: Payer: Self-pay | Admitting: Gastroenterology

## 2020-08-21 ENCOUNTER — Ambulatory Visit (INDEPENDENT_AMBULATORY_CARE_PROVIDER_SITE_OTHER): Payer: 59 | Admitting: Obstetrics and Gynecology

## 2020-08-21 ENCOUNTER — Encounter: Payer: Self-pay | Admitting: Obstetrics and Gynecology

## 2020-08-21 ENCOUNTER — Other Ambulatory Visit: Payer: Self-pay

## 2020-08-21 VITALS — BP 124/78

## 2020-08-21 DIAGNOSIS — N907 Vulvar cyst: Secondary | ICD-10-CM | POA: Diagnosis not present

## 2020-08-21 NOTE — Progress Notes (Signed)
   Mary [name] 1961/02/07 [id number]  SUBJECTIVE:  59 y.o. [id number] female presents for evaluation of a right labial bump.  Lesion is not painful but catches on clothing and is bothersome to her because she feels it when cleansing the area.  She had two vulvar lesions removed about 2 years ago with pathology indicating they were consistent with hidradenoma papilliferum.  Current Outpatient Medications  Medication Sig Dispense Refill  . pantoprazole (PROTONIX) 40 MG tablet Take 1 tablet (40 mg total) by mouth daily. 30 tablet 11  . polyethylene glycol (MIRALAX / GLYCOLAX) packet Take 17 g by mouth daily. 14 each 0   No current facility-administered medications for this visit.   Allergies: Patient has no known allergies.  No LMP recorded. Patient has had a hysterectomy.  Past medical history,surgical history, problem list, medications, allergies, family history and social history were all reviewed and documented as reviewed in the EPIC chart.   OBJECTIVE:  BP 124/78  The patient appears well, alert, oriented, in no distress. PELVIC EXAM: VULVA: normal appearing vulva with no tenderness, the right posterior labia majora contains a 7 mm subcutaneous firm cystic lesion that feels consistent with a sebaceous cyst, no tenderness or overlying erythema, as diagrammed below  Physical Exam Genitourinary:       Procedure Labial cyst excision Patient was counseled on alternatives to excision today including conservative management with soaking in warm compresses.  Discussed that this might not completely resolve lesion, and also could return.  Also discussed the risks of excision to include disfigurement of the area, bleeding, infection, possible need for corrective surgery in the operating room.  After the patient considered the above she wanted to proceed with excision today. The right labial area was cleansed with a Betadine swab. The area was anesthetized with local lidocaine 1% (0.6  ml) And confirming anesthesia was adequate, the lesion was then grasped with a pickup and an 11 blade scalpel was used to incise around the lesion and also to undermine it to result in complete removal of the cystic appearing translucent pearl-like structure.  The cyst was submitted to pathology for analysis.  Palpation of the area afterwards indicated the cyst had been removed in its entirety. The incision was then reapproximated using 2 simple interrupted stitches with a 4-0 Vicryl suture.  Hemostasis was excellent with a cosmetically normal appearing result.  Patient tolerated the procedure well.  Chaperone: Caryn Bee present during the examination and procedure  ASSESSMENT:  59 y.o. [id number] here for labial cyst excision  PLAN:  Postprocedural cares are reviewed including use of Tylenol and ibuprofen, soaking, applying bacitracin/petroleum jelly around the area for the first 2 days while it is healing.  Stitches will dissolve on their own.  If any signs of erythema or purulent drainage she needs to present for evaluation for infection.  Encouraged to avoid contamination with urine or fecal matter.  I recommend she return in a week to check in on the healing.   Joseph Pierini MD 08/21/20

## 2020-08-23 LAB — PATHOLOGY REPORT

## 2020-08-23 LAB — TISSUE SPECIMEN

## 2020-08-28 ENCOUNTER — Other Ambulatory Visit: Payer: Self-pay

## 2020-08-28 ENCOUNTER — Encounter: Payer: Self-pay | Admitting: Obstetrics and Gynecology

## 2020-08-28 ENCOUNTER — Ambulatory Visit: Payer: 59 | Admitting: Obstetrics and Gynecology

## 2020-08-28 VITALS — BP 134/86

## 2020-08-28 DIAGNOSIS — D239 Other benign neoplasm of skin, unspecified: Secondary | ICD-10-CM

## 2020-08-28 NOTE — Progress Notes (Signed)
   Mary [name] Jan 22, 1961 [id number]  SUBJECTIVE:  59 y.o. [id number] female presents for follow-up from the labial cyst excision 1 week ago.  Pathology on the specimen was consistent with hidradenoma papilliferum.   Current Outpatient Medications  Medication Sig Dispense Refill  . pantoprazole (PROTONIX) 40 MG tablet Take 1 tablet (40 mg total) by mouth daily. 30 tablet 11  . polyethylene glycol (MIRALAX / GLYCOLAX) packet Take 17 g by mouth daily. 14 each 0   No current facility-administered medications for this visit.   Allergies: Patient has no known allergies.  No LMP recorded. Patient has had a hysterectomy.   OBJECTIVE:  BP 134/86  The patient appears well, alert, oriented x 3, in no distress. PELVIC EXAM: VULVA: normal appearing vulva with no masses, tenderness or lesions, well-healed surgical site on the right labia majora, the stitches have dissolved  Chaperone: Caryn Bee present during the examination  ASSESSMENT:  59 y.o. [id number] here for postprocedural follow-up  PLAN:  We discussed that she is healed well.  Suggestions for decreasing likelihood of recurrence including avoiding prolonged exposure to moist clothing as much as possible are reviewed.  Follow-up as needed.   Joseph Pierini MD 08/28/20

## 2020-09-25 ENCOUNTER — Encounter: Payer: 59 | Admitting: Obstetrics and Gynecology

## 2020-12-09 ENCOUNTER — Encounter: Payer: Self-pay | Admitting: *Deleted

## 2020-12-09 ENCOUNTER — Other Ambulatory Visit: Payer: Self-pay

## 2020-12-09 ENCOUNTER — Ambulatory Visit
Admission: EM | Admit: 2020-12-09 | Discharge: 2020-12-09 | Disposition: A | Discharge status: Home / Self Care | Payer: 59 | Attending: Emergency Medicine | Admitting: Emergency Medicine

## 2020-12-09 DIAGNOSIS — R1033 Periumbilical pain: Secondary | ICD-10-CM | POA: Diagnosis not present

## 2020-12-09 LAB — POCT URINALYSIS DIP (MANUAL ENTRY)
Bilirubin, UA: NEGATIVE
Glucose, UA: NEGATIVE mg/dL
Leukocytes, UA: NEGATIVE
Nitrite, UA: NEGATIVE
Protein Ur, POC: NEGATIVE mg/dL
Spec Grav, UA: 1.025 (ref 1.010–1.025)
Urobilinogen, UA: 0.2 E.U./dL
pH, UA: 7 (ref 5.0–8.0)

## 2020-12-09 MED ORDER — PANTOPRAZOLE SODIUM 40 MG PO TBEC: 40.0000 mg | DELAYED_RELEASE_TABLET | Freq: Every day | ORAL | 0 refills | Status: DC

## 2020-12-09 NOTE — ED Triage Notes (Signed)
C/O low abd pain x 3-4 days; states at onset was "excruciating and constant", but has since become intermittent.  Denies any n/v/d.  Denies fevers.  Denies vaginal discharge.

## 2020-12-09 NOTE — ED Provider Notes (Signed)
[facility]    CSN: [id number] Arrival date & time: 12/09/20  1056      History   Chief Complaint Chief Complaint  Patient presents with  . Abdominal Pain    HPI Mary [name] is a 60 y.o. female  With extensive history as below presenting for 3-4-day course of periumbilical pain. States onset was sudden, very painful, though has since become intermittent and less severe. Thinks GERD may be contributory, so she took Mylanta which helped. States this is all preceded by eating steak. Patient is s/p chemo and partial colectomy for colon cancer. Has been in remission x5 years. Denies hematochezia, melena, nausea, vomiting, fever.  Past Medical History:  Diagnosis Date  . Adenocarcinoma sigmoid colon 05/12/11   Stage III(pT2pN2b) descending sigmoid  . Arm DVT (deep venous thromboembolism), acute ([place]) 07/04/11   r/t PAC  . Blood transfusion   . Cancer of descending colon ([place]) 05/12/2011  . GERD (gastroesophageal reflux disease)   . History of migraine headaches   . Incisional hernia   . iron deficiency anemia   . Multiple hemangiomas    Liver  . 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 w/prolonged cold sensitivity--Oxaliplatin related  . Pneumothorax 07/19/11   Bilateral  . Renal cysts, acquired, bilateral   . Sleep apnea     Patient Active Problem List   Diagnosis Date Noted  . Labial cyst 03/05/2018  . Chest pain 05/30/2017  . Chronic nonintractable headache 05/04/2017  . Ankle edema, bilateral 05/04/2017  . Cellulitis 04/04/2016  . Abnormal x-ray 04/04/2016  . Hearing loss due to cerumen impaction 01/14/2016  . Impaired fasting blood sugar 01/14/2016  . Plantar fasciitis, right 11/22/2015  . Right calcaneal fracture 08/15/2015  . OSA (obstructive sleep apnea) 09/26/2014  . Peripheral neuropathy due to chemotherapy ([place]) 06/05/2014  . Obesity 06/05/2014  . history of DVT (deep venous thrombosis) 02/20/2014  . GERD (gastroesophageal reflux  disease) 02/20/2014  . Hernia 09/22/2012  . Iron deficiency anemia, unspecified 08/30/2011  . Colon cancer ([place]) 08/29/2011  . Cancer of descending colon ([place]) 05/12/2011    Past Surgical History:  Procedure Laterality Date  . ABDOMINAL HYSTERECTOMY  mid 20s   bleeding  . COLON SURGERY  05/17/11   Partial colectomy  . COLONOSCOPY  09/04/2014   One diminutive polyp at the recto sigmoid colon removed. Diverticulosis of the entire examined colon. Internal hemorrhoids.   Marland Kitchen HERNIA REPAIR  09/06/12   lap incisional hernia  . INSERTION OF MESH  09/06/2012   Procedure: INSERTION OF MESH;  Surgeon: Gwenyth Ober, MD;  Location: [facility];  Service: General;  Laterality: N/A;  . PORT-A-CATH REMOVAL  05/06/2012   Procedure: MINOR REMOVAL PORT-A-CATH;  Surgeon: Gwenyth Ober, MD;  Location: [facility];  Service: General;  Laterality: Right;  . PORTACATH PLACEMENT  07/20/11   Right side placed  . PORTACATH PLACEMENT     left side & right side  . VENTRAL HERNIA REPAIR  09/06/2012   Procedure: LAPAROSCOPIC VENTRAL HERNIA;  Surgeon: Gwenyth Ober, MD;  Location: [facility];  Service: General;  Laterality: N/A;    OB History    Gravida  5   Para  3   Term      Preterm      AB  2   Living  3     SAB  2   IAB      Ectopic      Multiple  Live Births               Home Medications    Prior to Admission medications   Medication Sig Start Date End Date Taking? Authorizing Provider  pantoprazole (PROTONIX) 40 MG tablet Take 1 tablet (40 mg total) by mouth daily. 12/09/20   Hall-Potvin, [place], PA-C  polyethylene glycol (MIRALAX / GLYCOLAX) packet Take 17 g by mouth daily. 02/27/14   Verlee Monte, MD  potassium chloride SA (K-DUR,KLOR-CON) 20 MEQ tablet Take 20 mEq by mouth daily. 11/25/11 01/20/12  Owens Shark, NP  prochlorperazine (COMPAZINE) 10 MG tablet Take 10 mg by mouth every 6 (six) hours as needed. For nausea/vomiting.  01/18/12  Ladell Pier, MD     Family History Family History  Problem Relation Age of Onset  . Diabetes Mother   . Hypertension Mother   . Cancer Father        leukemia  . Cancer Maternal Uncle        lung  . Cancer Maternal Uncle        colon  . Colitis Maternal Uncle   . Cancer Maternal Uncle        prostate  . Cancer Brother        PROSTATE  . Other Brother        MVA  . Esophageal cancer Neg Hx   . Rectal cancer Neg Hx   . Stomach cancer Neg Hx     Social History Social History   Tobacco Use  . Smoking status: Never Smoker  . Smokeless tobacco: Never Used  Vaping Use  . Vaping Use: Never used  Substance Use Topics  . Alcohol use: No  . Drug use: No     Allergies   Patient has no known allergies.   Review of Systems Review of Systems   Physical Exam Triage Vital Signs ED Triage Vitals [12/09/20 1112]  Enc Vitals Group     BP (!) 155/95     Pulse Rate 84     Resp 18     Temp 98.4 F (36.9 C)     Temp Source Temporal     SpO2 97 %     Weight      Height      Head Circumference      Peak Flow      Pain Score 7     Pain Loc      Pain Edu?      Excl. in [place]?    No data found.  Updated Vital Signs BP (!) 155/95   Pulse 84   Temp 98.4 F (36.9 C) (Temporal)   Resp 18   SpO2 97%   Visual Acuity Right Eye Distance:   Left Eye Distance:   Bilateral Distance:    Right Eye Near:   Left Eye Near:    Bilateral Near:     Physical Exam Constitutional:      General: She is not in acute distress. HENT:     Head: Normocephalic and atraumatic.  Eyes:     General: No scleral icterus.    Pupils: Pupils are equal, round, and reactive to light.  Cardiovascular:     Rate and Rhythm: Normal rate.  Pulmonary:     Effort: Pulmonary effort is normal.  Abdominal:     General: A surgical scar is present. Bowel sounds are normal. There is no distension or abdominal bruit.     Palpations: Abdomen is soft. There is no hepatomegaly,  splenomegaly or pulsatile mass.      Tenderness: There is abdominal tenderness in the periumbilical area.     Comments: Mild TTP  Skin:    Coloration: Skin is not jaundiced or pale.  Neurological:     Mental Status: She is alert and oriented to person, place, and time.      UC Treatments / Results  Labs (all labs ordered are listed, but only abnormal results are displayed) Labs Reviewed  POCT URINALYSIS DIP (MANUAL ENTRY) - Abnormal; Notable for the following components:      Result Value   Ketones, POC UA trace (5) (*)    Blood, UA trace-intact (*)    All other components within normal limits    EKG   Radiology No results found.  Procedures Procedures (including critical care time)  Medications Ordered in UC Medications - No data to display  Initial Impression / Assessment and Plan / UC Course  I have reviewed the triage vital signs and the nursing notes.  Pertinent labs & imaging results that were available during my care of the patient were reviewed by me and considered in my medical decision making (see chart for details).     Urine as above, exam benign. Patient has been off of Protonix: We'll resume this, provided patient handout on GERD diet, will have patient follow-up with her GI doctor within the month. Keep track of symptoms in the interim.  Return precautions discussed, pt verbalized understanding and is agreeable to plan. Final Clinical Impressions(s) / UC Diagnoses   Final diagnoses:  Periumbilical abdominal pain     Discharge Instructions     Take reflux medication once daily. Please take time to review food suggestion packet attached. Important follow-up with primary care/GI for further evaluation and management. Go to ER for vomiting, black stools, blood in stools, severe abdominal pain, fever.    ED Prescriptions    Medication Sig Dispense Auth. Provider   pantoprazole (PROTONIX) 40 MG tablet Take 1 tablet (40 mg total) by mouth daily. 30 tablet Hall-Potvin, [place], PA-C      I have reviewed the PDMP during this encounter.   Hall-Potvin, [place], [place] 12/09/20 1327

## 2020-12-09 NOTE — Discharge Instructions (Addendum)
Take reflux medication once daily. Please take time to review food suggestion packet attached. Important follow-up with primary care/GI for further evaluation and management. Go to ER for vomiting, black stools, blood in stools, severe abdominal pain, fever. 

## 2020-12-11 ENCOUNTER — Other Ambulatory Visit (INDEPENDENT_AMBULATORY_CARE_PROVIDER_SITE_OTHER): Payer: 59

## 2020-12-11 ENCOUNTER — Encounter: Payer: Self-pay | Admitting: Gastroenterology

## 2020-12-11 ENCOUNTER — Ambulatory Visit: Payer: 59 | Admitting: Gastroenterology

## 2020-12-11 ENCOUNTER — Other Ambulatory Visit: Payer: Self-pay

## 2020-12-11 VITALS — BP 142/86 | HR 68 | Ht 71.0 in | Wt 240.4 lb

## 2020-12-11 DIAGNOSIS — R1032 Left lower quadrant pain: Secondary | ICD-10-CM

## 2020-12-11 DIAGNOSIS — Z85038 Personal history of other malignant neoplasm of large intestine: Secondary | ICD-10-CM | POA: Diagnosis not present

## 2020-12-11 MED ORDER — PANTOPRAZOLE SODIUM 40 MG PO TBEC: 40.0000 mg | DELAYED_RELEASE_TABLET | Freq: Every day | ORAL | 11 refills | Status: DC

## 2020-12-11 NOTE — Progress Notes (Signed)
Chief Complaint: Dysphagia  Referring Provider:  Hoyt Koch, *      ASSESSMENT AND PLAN;   #1. LLQ and peri-umblical abdo pain.  #2.  H/O Colon Ca s/p sig resection followed by neoadjuvant chemo. (Stage III) 05/2011. Last colon 08/2019 as below- neg. CEA 06/2017: 2.3 (Nl)  Plan: - CBC, CMP, CEA - CT Abdo/pelvis. Non contrast CT abdo/pelvis followed CECT. Need to r/o any kidney stones and recurrence of colon ca - Protonix 40mg  po QD, 30, 11 refills. -If still with problems, trial of bentyl. -Increase water intake    HPI:    Mary [name] is a 60 y.o. female   periumblical pain x 1 day  without diarrhea or constipation.  This was associated with abdominal bloating and subjective fevers.  No chills.  She describes pain is more like cramps-severe enough that she went to urgent care.  Her urine apparently was positive for RBCs.  We do not have those records.  She was told to see urology if she continues to have positive UA.  She will get in touch with Dr. Sharlet Salina regarding the same.  No significant nausea or vomiting.  She denies having any further problems with dysphagia.  Past GI procedures: EGD 08/2019 - Mild Schatzki ring s/p dilatation - 2 cm hiatal hernia. - Mild gastritis  Colon 08/2019 -Colon polyp S/P polypectomy. Bx- TA. Rpt 3 yrs -Left colonic diverticulosis. -Otherwise normal postoperative colonoscopy.  Ba swallow 07/06/2019- small HH, mild eso dysmotility.  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 Adenocarcinoma sigmoid colon 05/12/11   Stage III(pT2pN2b) descending sigmoid  . Arm DVT (deep venous thromboembolism), acute ([place]) 07/04/11   r/t PAC  . Blood transfusion   . Cancer of descending colon ([place]) 05/12/2011  . GERD (gastroesophageal reflux disease)   . History of migraine headaches   . Incisional hernia   . iron deficiency anemia   . Multiple hemangiomas    Liver  . 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 w/prolonged cold sensitivity--Oxaliplatin related  . Pneumothorax 07/19/11   Bilateral  . Renal cysts, acquired, bilateral   . Sleep apnea     Past Surgical History:  Procedure Laterality Date  . ABDOMINAL HYSTERECTOMY  mid 20s   bleeding  . COLON SURGERY  05/17/11   Partial colectomy  . COLONOSCOPY  09/04/2014   One diminutive polyp at the recto sigmoid colon removed. Diverticulosis of the entire examined colon. Internal hemorrhoids.   Marland Kitchen HERNIA REPAIR  09/06/12   lap incisional hernia  . INSERTION OF MESH  09/06/2012   Procedure: INSERTION OF MESH;  Surgeon: Gwenyth Ober, MD;  Location: [facility];  Service: General;  Laterality: N/A;  . PORT-A-CATH REMOVAL  05/06/2012   Procedure: MINOR REMOVAL PORT-A-CATH;  Surgeon: Gwenyth Ober, MD;  Location: [facility];  Service: General;  Laterality: Right;  . PORTACATH PLACEMENT  07/20/11   Right side placed  . PORTACATH PLACEMENT     left side & right side  . VENTRAL HERNIA REPAIR  09/06/2012   Procedure: LAPAROSCOPIC VENTRAL HERNIA;  Surgeon: Gwenyth Ober, MD;  Location: [facility];  Service: General;  Laterality: N/A;    Family History  Problem Relation Age of Onset  . Diabetes Mother   . Hypertension Mother   . Cancer Father        leukemia  . Cancer Maternal Uncle  lung  . Cancer Maternal Uncle        colon  . Colitis Maternal Uncle   . Cancer Maternal Uncle        prostate  . Cancer Brother        PROSTATE  . Other Brother        MVA  . Esophageal cancer Neg Hx   . Rectal cancer Neg Hx   . Stomach cancer Neg Hx     Social History   Tobacco Use  . Smoking status: Never Smoker  . Smokeless tobacco: Never Used  Vaping Use  . Vaping Use: Never used  Substance Use Topics  . Alcohol use: No  . Drug use: No    Current Outpatient Medications  Medication Sig Dispense Refill  . pantoprazole (PROTONIX) 40 MG tablet Take 1 tablet (40 mg total) by mouth daily. 30 tablet  0  . polyethylene glycol (MIRALAX / GLYCOLAX) packet Take 17 g by mouth daily. (Patient taking differently: Take 17 g by mouth as needed.) 14 each 0   No current facility-administered medications for this visit.    No Known Allergies  Review of Systems:  Constitutional: Denies fever, chills, diaphoresis, appetite change and fatigue.  HEENT: Denies photophobia, eye pain, redness, hearing loss, ear pain, congestion, sore throat, rhinorrhea, sneezing, mouth sores, neck pain, neck stiffness and tinnitus.   Respiratory: Denies SOB, DOE, cough, chest tightness,  and wheezing.   Cardiovascular: Denies chest pain, palpitations and leg swelling.  Genitourinary: Denies dysuria, urgency, frequency, hematuria, flank pain and difficulty urinating.  Musculoskeletal: Denies myalgias, back pain, joint swelling, arthralgias and gait problem.  Skin: No rash.  Neurological: Denies dizziness, seizures, syncope, weakness, light-headedness, numbness and headaches.  Hematological: Denies adenopathy. Easy bruising, personal or family bleeding history  Psychiatric/Behavioral: No anxiety or depression     Physical Exam:    BP (!) 142/86   Pulse 68   Ht 5\' 11"  (1.803 m)   Wt 240 lb 6 oz (109 kg)   BMI 33.53 kg/m  Filed Weights   12/11/20 1406  Weight: 240 lb 6 oz (109 kg)   Constitutional:  Well-developed, in no acute distress. Psychiatric: Normal mood and affect. Behavior is normal. HEENT: Pupils normal.  Conjunctivae are normal. No scleral icterus. Neck supple.  Cardiovascular: Normal rate, regular rhythm. No edema Pulmonary/chest: Effort normal and breath sounds normal. No wheezing, rales or rhonchi. Abdominal: Soft, nondistended. LLQ tenderness. Bowel sounds active throughout. There are no masses palpable. No hepatomegaly. Rectal:  defered Neurological: Alert and oriented to person place and time. Skin: Skin is warm and dry. No rashes noted.  Data Reviewed: I have personally reviewed following  labs and imaging studies  CBC: CBC Latest Ref Rng & Units 06/26/2019 09/17/2018 03/05/2018  WBC 4.0 - 10.5 K/uL 7.7 6.3 7.2  Hemoglobin 12.0 - 15.0 g/dL 12.3 12.1 11.8(L)  Hematocrit 36.0 - 46.0 % 40.0 37.9 37.7  Platelets 150 - 400 K/uL 191 191.0 204.0    CMP: CMP Latest Ref Rng & Units 06/26/2019 09/17/2018 09/17/2018  Glucose 70 - 99 mg/dL 124(H) - 107(H)  BUN 6 - 20 mg/dL 15 - 17  Creatinine 0.44 - 1.00 mg/dL 1.33(H) - 1.13  Sodium 135 - 145 mmol/L 139 - 139  Potassium 3.5 - 5.1 mmol/L 3.9 - 3.8  Chloride 98 - 111 mmol/L 106 - 105  CO2 22 - 32 mmol/L 24 - 28  Calcium 8.9 - 10.3 mg/dL 9.3 9.3 9.1  Total Protein 6.0 - 8.3  g/dL - - 7.2  Total Bilirubin 0.2 - 1.2 mg/dL - - 0.4  Alkaline Phos 39 - 117 U/L - - 89  AST 0 - 37 U/L - - 16  ALT 0 - 35 U/L - - 19     Radiology Studies: No results found.    Carmell Austria, MD 12/11/2020, 2:11 PM  Cc: Hoyt Koch, *

## 2020-12-11 NOTE — Patient Instructions (Addendum)
If you are age 60 or older, your body mass index should be between 23-30. Your Body mass index is 33.53 kg/m. If this is out of the aforementioned range listed, please consider follow up with your Primary Care Provider.  If you are age 60 or younger, your body mass index should be between 19-25. Your Body mass index is 33.53 kg/m. If this is out of the aformentioned range listed, please consider follow up with your Primary Care Provider.   Please go to the lab on the 2nd floor [address] before you leave the office today.   We have sent the following medications to your pharmacy for you to pick up at your convenience: Protonix  You have been scheduled for a CT [facility] the abdomen [facility] [place] [address] [address] flood Radiology).   You are scheduled on --- at ---. You should arrive 15 minutes prior to your appointment time for registration. Please follow the written instructions below on the day of your exam:  WARNING: IF YOU ARE ALLERGIC TO IODINE/X-RAY DYE, PLEASE NOTIFY RADIOLOGY IMMEDIATELY AT [phone]! YOU WILL BE GIVEN A 13 HOUR PREMEDICATION PREP.  1) Do not eat or drink anything after --- (4 hours prior to your test) 2) You have been given 2 bottles of oral contrast to drink. The solution may taste better if refrigerated, but do NOT add ice or any other liquid to this solution. Shake well before drinking.    Drink 1 bottle of contrast @ --- (2 hours prior to your exam)  Drink 1 bottle of contrast @ --- (1 hour prior to your exam)  You may take any medications as prescribed with a small amount of water, if necessary. If you take any of the following medications: METFORMIN, GLUCOPHAGE, GLUCOVANCE, AVANDAMET, RIOMET, FORTAMET, [place] MET, JANUMET, GLUMETZA or METAGLIP, you MAY be asked to HOLD this medication 48 hours AFTER the exam.  The purpose of you drinking the oral contrast is to aid in the visualization of your intestinal  tract. The contrast solution may cause some diarrhea. Depending on your individual set of symptoms, you may also receive an intravenous injection of x-ray contrast/dye. Plan on being at [facility] for 30 minutes or longer, depending on the type of exam you are having performed.  This test typically takes 30-45 minutes to complete.  If you have any questions regarding your exam or if you need to reschedule, you may call the CT department at [phone] between the hours of 8:00 am and 5:00 pm, Monday-Friday.  ________________________________________________________________________  Please go by Radiology on the 1st floor to schedule your CT before you leave today.   Thank you,  Dr. Jackquline Denmark

## 2020-12-12 LAB — CBC WITH DIFFERENTIAL/PLATELET
Basophils Absolute: 0.1 10*3/uL (ref 0.0–0.1)
Basophils Relative: 1.3 % (ref 0.0–3.0)
Eosinophils Absolute: 0.1 10*3/uL (ref 0.0–0.7)
Eosinophils Relative: 2.5 % (ref 0.0–5.0)
HCT: 39.3 % (ref 36.0–46.0)
Hemoglobin: 12.2 g/dL (ref 12.0–15.0)
Lymphocytes Relative: 34.6 % (ref 12.0–46.0)
Lymphs Abs: 2 10*3/uL (ref 0.7–4.0)
MCHC: 30.9 g/dL (ref 30.0–36.0)
MCV: 71.3 fl — ABNORMAL LOW (ref 78.0–100.0)
Monocytes Absolute: 0.5 10*3/uL (ref 0.1–1.0)
Monocytes Relative: 9.2 % (ref 3.0–12.0)
Neutro Abs: 3 10*3/uL (ref 1.4–7.7)
Neutrophils Relative %: 52.4 % (ref 43.0–77.0)
Platelets: 209 10*3/uL (ref 150.0–400.0)
RBC: 5.52 Mil/uL — ABNORMAL HIGH (ref 3.87–5.11)
RDW: 17.4 % — ABNORMAL HIGH (ref 11.5–15.5)
WBC: 5.8 10*3/uL (ref 4.0–10.5)

## 2020-12-12 LAB — COMPREHENSIVE METABOLIC PANEL
ALT: 15 U/L (ref 0–35)
AST: 13 U/L (ref 0–37)
Albumin: 4.2 g/dL (ref 3.5–5.2)
Alkaline Phosphatase: 89 U/L (ref 39–117)
BUN: 19 mg/dL (ref 6–23)
CO2: 31 mEq/L (ref 19–32)
Calcium: 9.7 mg/dL (ref 8.4–10.5)
Chloride: 105 mEq/L (ref 96–112)
Creatinine, Ser: 1.21 mg/dL — ABNORMAL HIGH (ref 0.40–1.20)
GFR: 49.08 mL/min — ABNORMAL LOW (ref >60.00)
Glucose, Bld: 87 mg/dL (ref 70–99)
Potassium: 4.8 mEq/L (ref 3.5–5.1)
Sodium: 139 mEq/L (ref 135–145)
Total Bilirubin: 0.3 mg/dL (ref 0.2–1.2)
Total Protein: 7.2 g/dL (ref 6.0–8.3)

## 2020-12-12 LAB — CEA: CEA: 1.6 ng/mL

## 2020-12-13 ENCOUNTER — Telehealth: Payer: Self-pay | Admitting: Gastroenterology

## 2020-12-13 NOTE — Telephone Encounter (Signed)
Patient returned the call advise her of lab results being normal per Dr. Lyndel Safe

## 2020-12-17 ENCOUNTER — Emergency Department (HOSPITAL_COMMUNITY): Payer: Worker's Compensation

## 2020-12-17 ENCOUNTER — Emergency Department (HOSPITAL_COMMUNITY)
Admission: EM | Admit: 2020-12-17 | Discharge: 2020-12-17 | Disposition: A | Discharge status: Home / Self Care | Payer: Worker's Compensation | Attending: Emergency Medicine | Admitting: Emergency Medicine

## 2020-12-17 ENCOUNTER — Telehealth: Payer: Self-pay | Admitting: Internal Medicine

## 2020-12-17 ENCOUNTER — Encounter (HOSPITAL_COMMUNITY): Payer: Self-pay

## 2020-12-17 ENCOUNTER — Other Ambulatory Visit: Payer: Self-pay

## 2020-12-17 DIAGNOSIS — Y990 Civilian activity done for income or pay: Secondary | ICD-10-CM | POA: Diagnosis not present

## 2020-12-17 DIAGNOSIS — Z85038 Personal history of other malignant neoplasm of large intestine: Secondary | ICD-10-CM | POA: Diagnosis not present

## 2020-12-17 DIAGNOSIS — S82852A Displaced trimalleolar fracture of left lower leg, initial encounter for closed fracture: Secondary | ICD-10-CM | POA: Diagnosis not present

## 2020-12-17 DIAGNOSIS — S99912A Unspecified injury of left ankle, initial encounter: Secondary | ICD-10-CM | POA: Diagnosis present

## 2020-12-17 DIAGNOSIS — W009XXA Unspecified fall due to ice and snow, initial encounter: Secondary | ICD-10-CM | POA: Diagnosis not present

## 2020-12-17 DIAGNOSIS — R52 Pain, unspecified: Secondary | ICD-10-CM

## 2020-12-17 MED ORDER — FENTANYL CITRATE (PF) 100 MCG/2ML IJ SOLN
100.0000 ug | Freq: Once | INTRAMUSCULAR | Status: AC
  Administered 2020-12-17: 100 ug via INTRAVENOUS
  Filled 2020-12-17: qty 2

## 2020-12-17 MED ORDER — HYDROMORPHONE HCL 1 MG/ML IJ SOLN
1.0000 mg | Freq: Once | INTRAMUSCULAR | Status: AC
  Administered 2020-12-17: 1 mg via INTRAVENOUS
  Filled 2020-12-17: qty 1

## 2020-12-17 MED ORDER — HYDROCODONE-ACETAMINOPHEN 5-325 MG PO TABS: 1.0000 | ORAL_TABLET | Freq: Four times a day (QID) | ORAL | 0 refills | Status: DC | PRN

## 2020-12-17 NOTE — Telephone Encounter (Signed)
She was prescribed Norco. She has a follow up with the orthopedic on 12-19-2020

## 2020-12-17 NOTE — ED Provider Notes (Signed)
  Physical Exam  BP (!) 185/108   Pulse (!) 57   Temp 98.2 F (36.8 C) (Oral)   Resp 14   Ht 5\' 11"  (1.803 m)   Wt 106.6 kg   SpO2 100%   BMI 32.78 kg/m   Physical Exam  ED Course/Procedures     .Ortho Injury Treatment  Date/Time: 12/17/2020 7:42 AM Performed by: Isla Pence, MD Authorized by: Isla Pence, MD   Consent:    Consent obtained:  Verbal   Consent given by:  Patient   Risks discussed:  Fracture   Alternatives discussed:  No treatmentInjury location: ankle Location details: left ankle Injury type: fracture Pre-procedure neurovascular assessment: neurovascularly intact  Anesthesia: Local anesthesia used: no  Patient sedated: NoManipulation performed: no Immobilization: splint Splint type: short leg and sugar tong Splint Applied by: ED Provider and Ortho Tech Supplies used: cotton padding,  Ortho-Glass and elastic bandage Post-procedure neurovascular assessment: post-procedure neurovascularly intact Post-procedure distal perfusion: normal Post-procedure neurological function: normal Post-procedure range of motion: normal     MDM  Pt given crutches.  She is to f/u with ortho.  Return if worse.       Isla Pence, MD 12/17/20 [phone]

## 2020-12-17 NOTE — Telephone Encounter (Signed)
Patient went to ED this morning because of a fall and she broke her ankle, states the medicine they gave her isn't working and she is in too much pain.

## 2020-12-17 NOTE — ED Triage Notes (Signed)
Patient arrived via [facility] after a fall this morning at work injuring her left ankle. No obvious deformity. Given 100 mcg Fentanyl given en route.

## 2020-12-17 NOTE — ED Provider Notes (Signed)
[facility] DEPT Provider Note   CSN: [id number] Arrival date & time: 12/17/20  4098     History Chief Complaint  Patient presents with  . Ankle Injury    Mary [name] is a 60 y.o. female.  The history is provided by the patient.  Ankle Injury This is a new problem. The current episode started less than 1 hour ago. The problem occurs constantly. The problem has been gradually worsening. Pertinent negatives include no chest pain, no abdominal pain and no headaches. The symptoms are aggravated by walking. The symptoms are relieved by rest. She has tried rest and a cold compress for the symptoms. The treatment provided mild relief.  Patient works at a dialysis center.  She reports slipping on ice while walking into work and she injured her left ankle.  No head injury or LOC.  No neck or back pain.  She reports pain and swelling in her left ankle only     Past Medical History:  Diagnosis Date  . Adenocarcinoma sigmoid colon 05/12/11   Stage III(pT2pN2b) descending sigmoid  . Arm DVT (deep venous thromboembolism), acute ([place]) 07/04/11   r/t PAC  . Blood transfusion   . Cancer of descending colon ([place]) 05/12/2011  . GERD (gastroesophageal reflux disease)   . History of migraine headaches   . Incisional hernia   . iron deficiency anemia   . Multiple hemangiomas    Liver  . 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 w/prolonged cold sensitivity--Oxaliplatin related  . Pneumothorax 07/19/11   Bilateral  . Renal cysts, acquired, bilateral   . Sleep apnea     Patient Active Problem List   Diagnosis Date Noted  . Labial cyst 03/05/2018  . Chest pain 05/30/2017  . Chronic nonintractable headache 05/04/2017  . Ankle edema, bilateral 05/04/2017  . Cellulitis 04/04/2016  . Abnormal x-ray 04/04/2016  . Hearing loss due to cerumen impaction 01/14/2016  . Impaired fasting blood sugar 01/14/2016  . Plantar fasciitis, right 11/22/2015  . Right  calcaneal fracture 08/15/2015  . OSA (obstructive sleep apnea) 09/26/2014  . Peripheral neuropathy due to chemotherapy ([place]) 06/05/2014  . Obesity 06/05/2014  . history of DVT (deep venous thrombosis) 02/20/2014  . GERD (gastroesophageal reflux disease) 02/20/2014  . Hernia 09/22/2012  . Iron deficiency anemia, unspecified 08/30/2011  . Colon cancer ([place]) 08/29/2011  . Cancer of descending colon ([place]) 05/12/2011    Past Surgical History:  Procedure Laterality Date  . ABDOMINAL HYSTERECTOMY  mid 20s   bleeding  . COLON SURGERY  05/17/11   Partial colectomy  . COLONOSCOPY  09/04/2014   One diminutive polyp at the recto sigmoid colon removed. Diverticulosis of the entire examined colon. Internal hemorrhoids.   Marland Kitchen HERNIA REPAIR  09/06/12   lap incisional hernia  . INSERTION OF MESH  09/06/2012   Procedure: INSERTION OF MESH;  Surgeon: Gwenyth Ober, MD;  Location: [facility];  Service: General;  Laterality: N/A;  . PORT-A-CATH REMOVAL  05/06/2012   Procedure: MINOR REMOVAL PORT-A-CATH;  Surgeon: Gwenyth Ober, MD;  Location: [facility];  Service: General;  Laterality: Right;  . PORTACATH PLACEMENT  07/20/11   Right side placed  . PORTACATH PLACEMENT     left side & right side  . VENTRAL HERNIA REPAIR  09/06/2012   Procedure: LAPAROSCOPIC VENTRAL HERNIA;  Surgeon: Gwenyth Ober, MD;  Location: [facility];  Service: General;  Laterality: N/A;     OB History  Gravida  5   Para  3   Term      Preterm      AB  2   Living  3     SAB  2   IAB      Ectopic      Multiple      Live Births              Family History  Problem Relation Age of Onset  . Diabetes Mother   . Hypertension Mother   . Cancer Father        leukemia  . Cancer Maternal Uncle        lung  . Cancer Maternal Uncle        colon  . Colitis Maternal Uncle   . Cancer Maternal Uncle        prostate  . Cancer Brother        PROSTATE  . Other Brother        MVA  . Esophageal cancer Neg  Hx   . Rectal cancer Neg Hx   . Stomach cancer Neg Hx     Social History   Tobacco Use  . Smoking status: Never Smoker  . Smokeless tobacco: Never Used  Vaping Use  . Vaping Use: Never used  Substance Use Topics  . Alcohol use: No  . Drug use: No    Home Medications Prior to Admission medications   Medication Sig Start Date End Date Taking? Authorizing Provider  pantoprazole (PROTONIX) 40 MG tablet Take 1 tablet (40 mg total) by mouth daily. 12/11/20   Jackquline Denmark, MD  polyethylene glycol [facility] / Floria Raveling) packet Take 17 g by mouth daily. Patient taking differently: Take 17 g by mouth as needed. 02/27/14   Verlee Monte, MD  potassium chloride SA (K-DUR,KLOR-CON) 20 MEQ tablet Take 20 mEq by mouth daily. 11/25/11 01/20/12  Owens Shark, NP  prochlorperazine (COMPAZINE) 10 MG tablet Take 10 mg by mouth every 6 (six) hours as needed. For nausea/vomiting.  01/18/12  Ladell Pier, MD    Allergies    Patient has no known allergies.  Review of Systems   Review of Systems  Cardiovascular: Negative for chest pain.  Gastrointestinal: Negative for abdominal pain.  Musculoskeletal: Positive for arthralgias and joint swelling.  Neurological: Negative for headaches.  All other systems reviewed and are negative.   Physical Exam Updated Vital Signs BP (!) 163/108 (BP Location: Left Arm)   Pulse 63   Temp 98.2 F (36.8 C) (Oral)   Resp 17   Ht 1.803 m (5\' 11" )   Wt 106.6 kg   SpO2 100%   BMI 32.78 kg/m   Physical Exam CONSTITUTIONAL: Well developed/well nourished HEAD: Normocephalic/atraumatic EYES: EOMI/PERRL ENMT: Mucous membranes moist NECK: supple no meningeal signs SPINE/BACK: No cervical spine CV: S1/S2 noted, no murmurs/rubs/gallops noted LUNGS: Lungs are clear to auscultation bilaterally, no apparent distress ABDOMEN: soft, nontender NEURO: Pt is awake/alert/appropriate, moves all extremitiesx4.  No facial droop.  GCS 15 EXTREMITIES: pulses normal/equal,  distal pulses equal and intact in her feet. Mild tenderness to left tibia.  No tenderness to left foot.  No left knee tenderness All other extremities/joints palpated/ranged and nontender SKIN: warm, color normal PSYCH: no abnormalities of mood noted, alert and oriented to situation  ED Results / Procedures / Treatments   Labs (all labs ordered are listed, but only abnormal results are displayed) Labs Reviewed - No data to display  EKG None  Radiology DG  Knee Left Port  Result Date: 12/17/2020 CLINICAL DATA:  61 year old female status post fall with pain. EXAM: PORTABLE LEFT KNEE - 1-2 VIEW COMPARISON:  None. FINDINGS: No evidence of joint effusion on cross-table lateral view. Preserved left knee joint spaces and alignment. Mild mostly medial compartment degenerative spurring. Additional subchondral sclerosis and lucency along the undersurface of the patella. No acute osseous abnormality identified. No discrete soft tissue injury. IMPRESSION: 1. No acute fracture or dislocation identified about the left knee. 2. Osteochondral degeneration of the patella. Electronically Signed   By: Genevie Ann M.D.   On: 12/17/2020 06:43   DG Ankle Left Port  Result Date: 12/17/2020 CLINICAL DATA:  60 year old female status post fall with ankle deformity. EXAM: PORTABLE LEFT ANKLE - 2 VIEW COMPARISON:  05/09/2014. FINDINGS: Comminuted trimalleolar fracture with lateral dislocation of the mortise joint. Lateral displacement and angulation of the distal tibia and fibula fragments. Mildly displaced posterior malleolus. Over riding and oblique fracture of the distal fibula metadiaphysis. Regional soft tissue swelling. Talus and calcaneus appear intact. Other visible bones of the left foot appear intact. IMPRESSION: Comminuted trimalleolar left ankle fracture with lateral dislocation. Electronically Signed   By: Genevie Ann M.D.   On: 12/17/2020 06:41    Procedures Procedures   Medications Ordered in ED Medications   HYDROmorphone (DILAUDID) injection 1 mg (has no administration in time range)  fentaNYL (SUBLIMAZE) injection 100 mcg (100 mcg Intravenous Given 12/17/20 6222)    ED Course  I have reviewed the triage vital signs and the nursing notes.  Pertinent  imaging results that were available during my care of the patient were reviewed by me and considered in my medical decision making (see chart for details).    MDM Rules/Calculators/A&P                         6:59 AM  Patient presents after slipping a fall at work.  She has sustained a closed left trimalleolar fracture. SHe is neurovascularly intact. Orthopedic consult is pending 7:21 AM Discussed with Dr. Alvan Dame, patient is recommended to follow-up with Dr. Doran Durand in 2 days Patient advised of plan. Plan is to give pain medications and then splint and likely discharge home. Signout Dr. Carolynne Edouard at shift change. Patient without any other acute traumatic injury Final Clinical Impression(s) / ED Diagnoses Final diagnoses:  Pain  Closed trimalleolar fracture of left ankle, initial encounter    Rx / DC Orders ED Discharge Orders         Ordered    HYDROcodone-acetaminophen (NORCO/VICODIN) 5-325 MG tablet  Every 6 hours PRN        12/17/20 0721           Ripley Fraise, MD 12/17/20 [phone]

## 2020-12-17 NOTE — Progress Notes (Signed)
Orthopedic Tech Progress Note Patient Details:  Mary [name] Jan 05, 1961 [id number]  Ortho Devices Type of Ortho Device: Short leg splint Ortho Device/Splint Location: Patient Left/ Posterior + Stirrup Ortho Device/Splint Interventions: Ordered,Application   Post Interventions Patient Tolerated: Fair Instructions Provided: Care of device   Rosana Hoes 12/17/2020, 7:44 AM

## 2020-12-17 NOTE — Telephone Encounter (Signed)
We are not allowed per [place] law to prescribe narcotics without a visit. Could have visit (even virtual) for consideration of a different medication or can return to urgent care/ER if pain is too severe to manage at home.

## 2020-12-18 ENCOUNTER — Encounter: Payer: Self-pay | Admitting: Internal Medicine

## 2020-12-18 ENCOUNTER — Telehealth (INDEPENDENT_AMBULATORY_CARE_PROVIDER_SITE_OTHER): Payer: 59 | Admitting: Internal Medicine

## 2020-12-18 ENCOUNTER — Telehealth: Payer: Self-pay | Admitting: Internal Medicine

## 2020-12-18 ENCOUNTER — Ambulatory Visit (HOSPITAL_BASED_OUTPATIENT_CLINIC_OR_DEPARTMENT_OTHER): Admission: RE | Admit: 2020-12-18 | Payer: 59 | Source: Ambulatory Visit

## 2020-12-18 DIAGNOSIS — M25572 Pain in left ankle and joints of left foot: Secondary | ICD-10-CM | POA: Diagnosis not present

## 2020-12-18 DIAGNOSIS — S82852A Displaced trimalleolar fracture of left lower leg, initial encounter for closed fracture: Secondary | ICD-10-CM

## 2020-12-18 MED ORDER — OXYCODONE-ACETAMINOPHEN 10-325 MG PO TABS: 1.0000 | ORAL_TABLET | Freq: Three times a day (TID) | ORAL | 0 refills | Status: AC | PRN

## 2020-12-18 NOTE — Telephone Encounter (Signed)
Patient has been scheduled for a virtual visit today at 10:20 am to discuss her pain medications.

## 2020-12-18 NOTE — Telephone Encounter (Signed)
Closed fracture left ankle

## 2020-12-18 NOTE — Progress Notes (Signed)
Virtual Visit via Video Note  I connected with Mary [name] on 12/18/20 at 10:20 AM EST by a video enabled telemedicine application and verified that I am speaking with the correct person using two identifiers.  The patient and the provider were at separate locations throughout the entire encounter. Patient location: home, Provider location: work   I discussed the limitations of evaluation and management by telemedicine and the availability of in person appointments. The patient expressed understanding and agreed to proceed. The patient and the provider were the only parties present for the visit unless noted in HPI below.  History of Present Illness: The patient is a 60 y.o. female with visit for ER follow up and severe pain left ankle. Started with fall at work yesterday on ice and fell backwards and ankle twisted under her. She did have fracture in several locations and is planned for surgery tomorrow. She is in temporary cast and crutches. A lot of swelling and severe pain. Has hydrocodone that hospital gave her and this does not help even when she takes 2 pills.   Observations/Objective: Appearance: in pain, breathing appears normal, casual grooming, abdomen does not appear distended, throat not visualized well, mental status is A and O times 3  Assessment and Plan: See problem oriented charting  Follow Up Instructions: rx oxycodone 5 day supply to help with pain prior to surgery  I discussed the assessment and treatment plan with the patient. The patient was provided an opportunity to ask questions and all were answered. The patient agreed with the plan and demonstrated an understanding of the instructions.   The patient was advised to call back or seek an in-person evaluation if the symptoms worsen or if the condition fails to improve as anticipated.  Hoyt Koch, MD

## 2020-12-18 NOTE — Telephone Encounter (Signed)
See below

## 2020-12-18 NOTE — Assessment & Plan Note (Signed)
Due to fracture and rx oxycodone as she is not getting relief with hydrocodone prescribed by ER. She is due to have surgery tomorrow.

## 2020-12-18 NOTE — Telephone Encounter (Signed)
   Pharmacy calling for diagnosis for oxyCODONE-acetaminophen (PERCOCET) 10-325 MG tablet  Please advise

## 2020-12-18 NOTE — Telephone Encounter (Signed)
Patient calling back again requesting that we also send in the patient ibuprofen (ADVIL,MOTRIN) 800 MG tablet [[id number]]  So they can alternate between the narcotic and the ibuprofen

## 2020-12-18 NOTE — Assessment & Plan Note (Signed)
Rx oxycodone after review of [place] database and appropriate. She is not getting adequate relief with hydrocodone. Counseled about need for bowel regimen with pain medication which she is not currently on.

## 2020-12-18 NOTE — Telephone Encounter (Signed)
Spoke with the pharmacy to give them a diagnosis for the Oxycodone. No other questions or concerns at this time.

## 2020-12-18 NOTE — Telephone Encounter (Signed)
  [place] calling back to make Dr Sharlet Salina aware patient had a prescription filled yesterday for same medication; prescribed by ED

## 2020-12-18 NOTE — Telephone Encounter (Signed)
Spoke with the pharmacy and they wanted to make sure that Dr. Sharlet Salina was aware that the patient was prescribed Hydrocodone before filling the Oxycodone. I advised the pharmacy that the Hydrocodone was ineffective which is why Dr. Sharlet Salina changed it to Oxycodone. Pharmacy verbalized understanding and was going to fill the medication.   Spoke with the patient and her daughter to make them aware of what the mix up was and that the medication was getting filled. They were very appreciative for the call. No other questions or concerns at this time.

## 2020-12-18 NOTE — Telephone Encounter (Signed)
Patient now calling back for the third time, pharmacy is telling them they are waiting for something from [place].

## 2020-12-18 NOTE — Telephone Encounter (Signed)
    Please return call to patient Her daughter is calling for status of medication Advised daughter Ibuprofen would not be prescribed; what is the alternative

## 2020-12-18 NOTE — Telephone Encounter (Signed)
Patients daughter called again and is requesting a call back at [phone]

## 2020-12-18 NOTE — Telephone Encounter (Signed)
With her renal function I would not recommend high dose ibuprofen for her. Also noted below pharmacy asking about her filling same medication yesterday. She did not fill same medication yesterday, she was given hydrocodone. I have had visit with patient today and this is not effective so prescription for oxycodone was done today which is a different medication. I am not sure if perhaps the pharmacy was confused.

## 2020-12-19 ENCOUNTER — Other Ambulatory Visit: Payer: Self-pay | Admitting: Student

## 2020-12-19 ENCOUNTER — Other Ambulatory Visit (HOSPITAL_COMMUNITY): Payer: Self-pay | Admitting: Orthopedic Surgery

## 2020-12-19 ENCOUNTER — Other Ambulatory Visit: Payer: Self-pay

## 2020-12-19 DIAGNOSIS — S82852A Displaced trimalleolar fracture of left lower leg, initial encounter for closed fracture: Secondary | ICD-10-CM

## 2020-12-20 ENCOUNTER — Encounter (HOSPITAL_BASED_OUTPATIENT_CLINIC_OR_DEPARTMENT_OTHER): Payer: Self-pay | Admitting: Orthopedic Surgery

## 2020-12-20 ENCOUNTER — Other Ambulatory Visit: Payer: Self-pay

## 2020-12-24 ENCOUNTER — Other Ambulatory Visit (HOSPITAL_COMMUNITY)
Admission: RE | Admit: 2020-12-24 | Discharge: 2020-12-24 | Disposition: A | Discharge status: Home / Self Care | Payer: 59 | Source: Ambulatory Visit | Attending: Orthopedic Surgery | Admitting: Orthopedic Surgery

## 2020-12-24 DIAGNOSIS — Z20822 Contact with and (suspected) exposure to covid-19: Secondary | ICD-10-CM | POA: Diagnosis not present

## 2020-12-24 DIAGNOSIS — Z01812 Encounter for preprocedural laboratory examination: Secondary | ICD-10-CM | POA: Diagnosis present

## 2020-12-24 LAB — SARS CORONAVIRUS 2 (TAT 6-24 HRS): SARS Coronavirus 2: NEGATIVE

## 2020-12-24 NOTE — Progress Notes (Signed)

## 2020-12-25 ENCOUNTER — Ambulatory Visit
Admission: RE | Admit: 2020-12-25 | Discharge: 2020-12-25 | Disposition: A | Discharge status: Home / Self Care | Payer: Self-pay | Source: Ambulatory Visit | Attending: Student | Admitting: Student

## 2020-12-25 ENCOUNTER — Other Ambulatory Visit: Payer: Self-pay

## 2020-12-25 DIAGNOSIS — S82852A Displaced trimalleolar fracture of left lower leg, initial encounter for closed fracture: Secondary | ICD-10-CM

## 2020-12-27 ENCOUNTER — Encounter (HOSPITAL_BASED_OUTPATIENT_CLINIC_OR_DEPARTMENT_OTHER): Payer: Self-pay | Admitting: Orthopedic Surgery

## 2020-12-27 ENCOUNTER — Ambulatory Visit (HOSPITAL_BASED_OUTPATIENT_CLINIC_OR_DEPARTMENT_OTHER)
Admission: RE | Admit: 2020-12-27 | Discharge: 2020-12-27 | Disposition: A | Discharge status: Home / Self Care | Payer: Worker's Compensation | Attending: Orthopedic Surgery | Admitting: Orthopedic Surgery

## 2020-12-27 ENCOUNTER — Ambulatory Visit (HOSPITAL_BASED_OUTPATIENT_CLINIC_OR_DEPARTMENT_OTHER): Payer: Worker's Compensation | Admitting: Certified Registered Nurse Anesthetist

## 2020-12-27 ENCOUNTER — Encounter (HOSPITAL_BASED_OUTPATIENT_CLINIC_OR_DEPARTMENT_OTHER)
Admission: RE | Disposition: A | Discharge status: Home / Self Care | Payer: Self-pay | Source: Home / Self Care | Attending: Orthopedic Surgery

## 2020-12-27 ENCOUNTER — Other Ambulatory Visit: Payer: Self-pay

## 2020-12-27 DIAGNOSIS — Z8249 Family history of ischemic heart disease and other diseases of the circulatory system: Secondary | ICD-10-CM | POA: Insufficient documentation

## 2020-12-27 DIAGNOSIS — Z833 Family history of diabetes mellitus: Secondary | ICD-10-CM | POA: Diagnosis not present

## 2020-12-27 DIAGNOSIS — Z800 Family history of malignant neoplasm of digestive organs: Secondary | ICD-10-CM | POA: Insufficient documentation

## 2020-12-27 DIAGNOSIS — S93432A Sprain of tibiofibular ligament of left ankle, initial encounter: Secondary | ICD-10-CM | POA: Insufficient documentation

## 2020-12-27 DIAGNOSIS — Z801 Family history of malignant neoplasm of trachea, bronchus and lung: Secondary | ICD-10-CM | POA: Insufficient documentation

## 2020-12-27 DIAGNOSIS — Z8042 Family history of malignant neoplasm of prostate: Secondary | ICD-10-CM | POA: Insufficient documentation

## 2020-12-27 DIAGNOSIS — W000XXA Fall on same level due to ice and snow, initial encounter: Secondary | ICD-10-CM | POA: Insufficient documentation

## 2020-12-27 DIAGNOSIS — Z85038 Personal history of other malignant neoplasm of large intestine: Secondary | ICD-10-CM | POA: Insufficient documentation

## 2020-12-27 DIAGNOSIS — S82852A Displaced trimalleolar fracture of left lower leg, initial encounter for closed fracture: Secondary | ICD-10-CM | POA: Diagnosis not present

## 2020-12-27 DIAGNOSIS — S9305XA Dislocation of left ankle joint, initial encounter: Secondary | ICD-10-CM | POA: Diagnosis not present

## 2020-12-27 DIAGNOSIS — Z86718 Personal history of other venous thrombosis and embolism: Secondary | ICD-10-CM | POA: Insufficient documentation

## 2020-12-27 DIAGNOSIS — Z807 Family history of other malignant neoplasms of lymphoid, hematopoietic and related tissues: Secondary | ICD-10-CM | POA: Insufficient documentation

## 2020-12-27 DIAGNOSIS — Z79899 Other long term (current) drug therapy: Secondary | ICD-10-CM | POA: Diagnosis not present

## 2020-12-27 HISTORY — PX: ORIF ANKLE FRACTURE: SHX5408

## 2020-12-27 SURGERY — OPEN REDUCTION INTERNAL FIXATION (ORIF) ANKLE FRACTURE
Anesthesia: General | Site: Ankle | Laterality: Left

## 2020-12-27 MED ORDER — CEFAZOLIN SODIUM-DEXTROSE 2-4 GM/100ML-% IV SOLN
2.0000 g | INTRAVENOUS | Status: AC
  Administered 2020-12-27: 2 g via INTRAVENOUS

## 2020-12-27 MED ORDER — PROMETHAZINE HCL 25 MG/ML IJ SOLN: 6.2500 mg | INTRAMUSCULAR | Status: DC | PRN

## 2020-12-27 MED ORDER — PROPOFOL 10 MG/ML IV BOLUS
INTRAVENOUS | Status: AC
  Filled 2020-12-27: qty 20

## 2020-12-27 MED ORDER — VANCOMYCIN HCL 500 MG IV SOLR
INTRAVENOUS | Status: AC
  Filled 2020-12-27: qty 500

## 2020-12-27 MED ORDER — PHENYLEPHRINE HCL (PRESSORS) 10 MG/ML IV SOLN
INTRAVENOUS | Status: DC | PRN
  Administered 2020-12-27: 80 ug via INTRAVENOUS

## 2020-12-27 MED ORDER — LIDOCAINE HCL (CARDIAC) PF 100 MG/5ML IV SOSY
PREFILLED_SYRINGE | INTRAVENOUS | Status: DC | PRN
  Administered 2020-12-27: 60 mg via INTRATRACHEAL

## 2020-12-27 MED ORDER — OXYCODONE HCL 5 MG/5ML PO SOLN: 5.0000 mg | Freq: Once | ORAL | Status: DC | PRN

## 2020-12-27 MED ORDER — LACTATED RINGERS IV SOLN: INTRAVENOUS | Status: DC

## 2020-12-27 MED ORDER — HYDROMORPHONE HCL 1 MG/ML IJ SOLN: 0.2500 mg | INTRAMUSCULAR | Status: DC | PRN

## 2020-12-27 MED ORDER — OXYCODONE HCL 5 MG PO TABS: 5.0000 mg | ORAL_TABLET | Freq: Once | ORAL | Status: DC | PRN

## 2020-12-27 MED ORDER — ROPIVACAINE HCL 5 MG/ML IJ SOLN
INTRAMUSCULAR | Status: DC | PRN
  Administered 2020-12-27: 50 mL via PERINEURAL

## 2020-12-27 MED ORDER — MIDAZOLAM HCL 2 MG/2ML IJ SOLN
INTRAMUSCULAR | Status: DC | PRN
  Administered 2020-12-27: 2 mg via INTRAVENOUS

## 2020-12-27 MED ORDER — MIDAZOLAM HCL 2 MG/2ML IJ SOLN
INTRAMUSCULAR | Status: AC
  Filled 2020-12-27: qty 2

## 2020-12-27 MED ORDER — FENTANYL CITRATE (PF) 100 MCG/2ML IJ SOLN
100.0000 ug | Freq: Once | INTRAMUSCULAR | Status: AC
Start: 2020-12-27 — End: 2020-12-27
  Administered 2020-12-27: 100 ug via INTRAVENOUS

## 2020-12-27 MED ORDER — ONDANSETRON HCL 4 MG/2ML IJ SOLN
INTRAMUSCULAR | Status: AC
  Filled 2020-12-27: qty 2

## 2020-12-27 MED ORDER — PHENYLEPHRINE 40 MCG/ML (10ML) SYRINGE FOR IV PUSH (FOR BLOOD PRESSURE SUPPORT)
PREFILLED_SYRINGE | INTRAVENOUS | Status: AC
  Filled 2020-12-27: qty 10

## 2020-12-27 MED ORDER — VANCOMYCIN HCL 500 MG IV SOLR
INTRAVENOUS | Status: DC | PRN
  Administered 2020-12-27: 500 mg via TOPICAL

## 2020-12-27 MED ORDER — LIDOCAINE 2% (20 MG/ML) 5 ML SYRINGE
INTRAMUSCULAR | Status: AC
  Filled 2020-12-27: qty 5

## 2020-12-27 MED ORDER — PROPOFOL 10 MG/ML IV BOLUS
INTRAVENOUS | Status: DC | PRN
  Administered 2020-12-27: 150 mg via INTRAVENOUS

## 2020-12-27 MED ORDER — CEFAZOLIN SODIUM-DEXTROSE 2-4 GM/100ML-% IV SOLN
INTRAVENOUS | Status: AC
  Filled 2020-12-27: qty 100

## 2020-12-27 MED ORDER — FENTANYL CITRATE (PF) 100 MCG/2ML IJ SOLN
INTRAMUSCULAR | Status: AC
  Filled 2020-12-27: qty 2

## 2020-12-27 MED ORDER — SODIUM CHLORIDE 0.9 % IV SOLN: INTRAVENOUS | Status: DC

## 2020-12-27 MED ORDER — 0.9 % SODIUM CHLORIDE (POUR BTL) OPTIME
TOPICAL | Status: DC | PRN
  Administered 2020-12-27: 500 mL

## 2020-12-27 MED ORDER — AMISULPRIDE (ANTIEMETIC) 5 MG/2ML IV SOLN: 10.0000 mg | Freq: Once | INTRAVENOUS | Status: DC | PRN

## 2020-12-27 MED ORDER — ONDANSETRON HCL 4 MG/2ML IJ SOLN
INTRAMUSCULAR | Status: DC | PRN
  Administered 2020-12-27: 4 mg via INTRAVENOUS

## 2020-12-27 MED ORDER — OXYCODONE HCL 5 MG PO TABS: 5.0000 mg | ORAL_TABLET | Freq: Four times a day (QID) | ORAL | 0 refills | Status: AC | PRN

## 2020-12-27 MED ORDER — FENTANYL CITRATE (PF) 250 MCG/5ML IJ SOLN
INTRAMUSCULAR | Status: DC | PRN
  Administered 2020-12-27: 50 ug via INTRAVENOUS
  Administered 2020-12-27: 25 ug via INTRAVENOUS

## 2020-12-27 MED ORDER — RIVAROXABAN 10 MG PO TABS: 10.0000 mg | ORAL_TABLET | Freq: Every day | ORAL | 0 refills | Status: DC

## 2020-12-27 MED ORDER — MIDAZOLAM HCL 2 MG/2ML IJ SOLN
2.0000 mg | Freq: Once | INTRAMUSCULAR | Status: AC
  Administered 2020-12-27: 2 mg via INTRAVENOUS

## 2020-12-27 MED ORDER — MEPERIDINE HCL 25 MG/ML IJ SOLN: 6.2500 mg | INTRAMUSCULAR | Status: DC | PRN

## 2020-12-27 MED ORDER — DEXAMETHASONE SODIUM PHOSPHATE 10 MG/ML IJ SOLN
INTRAMUSCULAR | Status: DC | PRN
  Administered 2020-12-27: 8 mg via INTRAVENOUS

## 2020-12-27 MED ORDER — DEXAMETHASONE SODIUM PHOSPHATE 10 MG/ML IJ SOLN
INTRAMUSCULAR | Status: AC
  Filled 2020-12-27: qty 1

## 2020-12-27 SURGICAL SUPPLY — 86 items
APL PRP STRL LF DISP 70% ISPRP (MISCELLANEOUS) ×1
BANDAGE ESMARK 6X9 LF (GAUZE/BANDAGES/DRESSINGS) IMPLANT
BIT DRILL 2.5X2.75 QC CALB (BIT) ×1 IMPLANT
BIT DRILL 2.9 CANN QC NONSTRL (BIT) ×1 IMPLANT
BIT DRILL 3.5X5.5 QC CALB (BIT) ×1 IMPLANT
BLADE SURG 15 STRL LF DISP TIS (BLADE) ×2 IMPLANT
BLADE SURG 15 STRL SS (BLADE) ×4
BNDG CMPR 9X4 STRL LF SNTH (GAUZE/BANDAGES/DRESSINGS)
BNDG CMPR 9X6 STRL LF SNTH (GAUZE/BANDAGES/DRESSINGS)
BNDG COHESIVE 4X5 TAN STRL (GAUZE/BANDAGES/DRESSINGS) ×2 IMPLANT
BNDG COHESIVE 6X5 TAN STRL LF (GAUZE/BANDAGES/DRESSINGS) ×2 IMPLANT
BNDG ESMARK 4X9 LF (GAUZE/BANDAGES/DRESSINGS) IMPLANT
BNDG ESMARK 6X9 LF (GAUZE/BANDAGES/DRESSINGS)
CANISTER SUCT 1200ML W/VALVE (MISCELLANEOUS) ×2 IMPLANT
CHLORAPREP W/TINT 26 (MISCELLANEOUS) ×2 IMPLANT
COVER BACK TABLE 60X90IN (DRAPES) ×2 IMPLANT
COVER MAYO STAND REUSABLE (DRAPES) ×2 IMPLANT
COVER WAND RF STERILE (DRAPES) IMPLANT
CUFF TOURN SGL QUICK 34 (TOURNIQUET CUFF)
CUFF TOURN SGL QUICK 42 (TOURNIQUET CUFF) ×1 IMPLANT
CUFF TRNQT CYL 34X4.125X (TOURNIQUET CUFF) IMPLANT
DECANTER SPIKE VIAL GLASS SM (MISCELLANEOUS) IMPLANT
DRAPE EXTREMITY T 121X128X90 (DISPOSABLE) ×2 IMPLANT
DRAPE OEC MINIVIEW 54X84 (DRAPES) ×2 IMPLANT
DRAPE U-SHAPE 47X51 STRL (DRAPES) ×2 IMPLANT
DRSG MEPITEL 4X7.2 (GAUZE/BANDAGES/DRESSINGS) ×2 IMPLANT
DRSG PAD ABDOMINAL 8X10 ST (GAUZE/BANDAGES/DRESSINGS) ×4 IMPLANT
ELECT REM PT RETURN 9FT ADLT (ELECTROSURGICAL) ×2
ELECTRODE REM PT RTRN 9FT ADLT (ELECTROSURGICAL) ×1 IMPLANT
FIXATION ZIPTIGHT ANKLE SNDSMS (Ankle) IMPLANT
GAUZE SPONGE 4X4 12PLY STRL (GAUZE/BANDAGES/DRESSINGS) ×2 IMPLANT
GLOVE ECLIPSE 8.0 STRL XLNG CF (GLOVE) ×2 IMPLANT
GLOVE SRG 8 PF TXTR STRL LF DI (GLOVE) ×2 IMPLANT
GLOVE SURG ENC MOIS LTX SZ8 (GLOVE) ×2 IMPLANT
GLOVE SURG LTX SZ6.5 (GLOVE) ×2 IMPLANT
GLOVE SURG UNDER POLY LF SZ7 (GLOVE) ×2 IMPLANT
GLOVE SURG UNDER POLY LF SZ8 (GLOVE) ×4
GOWN STRL REUS W/ TWL LRG LVL3 (GOWN DISPOSABLE) ×1 IMPLANT
GOWN STRL REUS W/ TWL XL LVL3 (GOWN DISPOSABLE) ×2 IMPLANT
GOWN STRL REUS W/TWL LRG LVL3 (GOWN DISPOSABLE) ×2
GOWN STRL REUS W/TWL XL LVL3 (GOWN DISPOSABLE) ×4
K-WIRE ACE 1.6X6 (WIRE) ×6
KWIRE ACE 1.6X6 (WIRE) IMPLANT
NEEDLE HYPO 22GX1.5 SAFETY (NEEDLE) IMPLANT
NS IRRIG 1000ML POUR BTL (IV SOLUTION) ×2 IMPLANT
PACK BASIN DAY SURGERY FS (CUSTOM PROCEDURE TRAY) ×2 IMPLANT
PAD CAST 4YDX4 CTTN HI CHSV (CAST SUPPLIES) ×1 IMPLANT
PADDING CAST ABS 4INX4YD NS (CAST SUPPLIES) ×3
PADDING CAST ABS COTTON 4X4 ST (CAST SUPPLIES) ×3 IMPLANT
PADDING CAST COTTON 4X4 STRL (CAST SUPPLIES) ×2
PADDING CAST COTTON 6X4 STRL (CAST SUPPLIES) ×2 IMPLANT
PENCIL SMOKE EVACUATOR (MISCELLANEOUS) ×2 IMPLANT
PLATE ACE 100DEG 7HOLE (Plate) ×1 IMPLANT
SANITIZER HAND PURELL 535ML FO (MISCELLANEOUS) ×2 IMPLANT
SCREW ACE CAN 4.0 40M (Screw) ×1 IMPLANT
SCREW ACE CAN 4.0 42M (Screw) ×1 IMPLANT
SCREW CORTICAL 3.5MM  16MM (Screw) ×6 IMPLANT
SCREW CORTICAL 3.5MM  20MM (Screw) ×2 IMPLANT
SCREW CORTICAL 3.5MM 14MM (Screw) ×1 IMPLANT
SCREW CORTICAL 3.5MM 16MM (Screw) IMPLANT
SCREW CORTICAL 3.5MM 18MM (Screw) ×1 IMPLANT
SCREW CORTICAL 3.5MM 20MM (Screw) IMPLANT
SCREW CORTICAL 3.5MM 26MM (Screw) ×2 IMPLANT
SCREW NLOCK CANC HEX 4X50 (Screw) ×2 IMPLANT
SHEET MEDIUM DRAPE 40X70 STRL (DRAPES) ×2 IMPLANT
SLEEVE SCD COMPRESS KNEE MED (MISCELLANEOUS) ×2 IMPLANT
SPLINT FAST PLASTER 5X30 (CAST SUPPLIES) ×20
SPLINT PLASTER CAST FAST 5X30 (CAST SUPPLIES) ×20 IMPLANT
SPONGE LAP 18X18 RF (DISPOSABLE) ×2 IMPLANT
STOCKINETTE 6  STRL (DRAPES) ×2
STOCKINETTE 6 STRL (DRAPES) ×1 IMPLANT
SUCTION FRAZIER HANDLE 10FR (MISCELLANEOUS) ×2
SUCTION TUBE FRAZIER 10FR DISP (MISCELLANEOUS) ×1 IMPLANT
SUT ETHILON 3 0 PS 1 (SUTURE) ×2 IMPLANT
SUT FIBERWIRE #2 38 T-5 BLUE (SUTURE)
SUT MNCRL AB 3-0 PS2 18 (SUTURE) ×1 IMPLANT
SUT VIC AB 0 SH 27 (SUTURE) IMPLANT
SUT VIC AB 2-0 SH 27 (SUTURE) ×2
SUT VIC AB 2-0 SH 27XBRD (SUTURE) ×1 IMPLANT
SUTURE FIBERWR #2 38 T-5 BLUE (SUTURE) IMPLANT
SYR BULB EAR ULCER 3OZ GRN STR (SYRINGE) ×2 IMPLANT
SYR CONTROL 10ML LL (SYRINGE) IMPLANT
TOWEL GREEN STERILE FF (TOWEL DISPOSABLE) ×4 IMPLANT
TUBE CONNECTING 20X1/4 (TUBING) ×2 IMPLANT
UNDERPAD 30X36 HEAVY ABSORB (UNDERPADS AND DIAPERS) ×2 IMPLANT
ZIPTIGHT ANKLE SYNODESMOSS FIX (Ankle) ×2 IMPLANT

## 2020-12-27 NOTE — Anesthesia Preprocedure Evaluation (Addendum)
Anesthesia Evaluation  Patient identified by MRN, date of birth, ID band Patient awake    Reviewed: Allergy & Precautions, H&P , NPO status , Patient's Chart, lab work & pertinent test results  History of Anesthesia Complications Negative for: history of anesthetic complications  Airway Mallampati: II  TM Distance: >3 FB Neck ROM: Full    Dental no notable dental hx. 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 negative psych ROS   GI/Hepatic Neg liver ROS, GERD  Controlled,  Endo/Other  negative endocrine ROS  Renal/GU Pt denies Kidney dz  negative genitourinary   Musculoskeletal negative musculoskeletal ROS (+)   Abdominal (+) + obese,   Peds  Hematology negative hematology ROS (+)   Anesthesia Other Findings   Reproductive/Obstetrics                            Anesthesia Physical  Anesthesia Plan  ASA: II  Anesthesia Plan: General   Post-op Pain Management:  Regional for Post-op pain   Induction: Intravenous  PONV Risk Score and Plan: 3 and Ondansetron, Dexamethasone, Midazolam and Treatment may vary due to age or medical condition  Airway Management Planned: LMA  Additional Equipment:   Intra-op Plan:   Post-operative Plan: Extubation in OR  Informed Consent: I have reviewed the patients History and Physical, chart, labs and discussed the procedure including the risks, benefits and alternatives for the proposed anesthesia with the patient or authorized representative who has indicated his/her understanding and acceptance.       Plan Discussed with: CRNA and Surgeon  Anesthesia Plan Comments:         Anesthesia Quick Evaluation

## 2020-12-27 NOTE — Progress Notes (Signed)
Assisted Dr. Sabra Heck with left, ultrasound guided, popliteal, adductor canal block. Side rails up, monitors on throughout procedure. See vital signs in flow sheet. Tolerated Procedure well.

## 2020-12-27 NOTE — Anesthesia Procedure Notes (Signed)
Procedure Name: LMA Insertion Date/Time: 12/27/2020 2:01 PM Performed by: Kathryne Hitch, CRNA Pre-anesthesia Checklist: Patient identified, Emergency Drugs available, Suction available and Patient being monitored Patient Re-evaluated:Patient Re-evaluated prior to induction Oxygen Delivery Method: Circle system utilized Preoxygenation: Pre-oxygenation with 100% oxygen Induction Type: IV induction Ventilation: Mask ventilation without difficulty LMA: LMA inserted LMA Size: 5.0 Number of attempts: 2 Placement Confirmation: positive ETCO2 Tube secured with: Tape Dental Injury: Teeth and Oropharynx as per pre-operative assessment

## 2020-12-27 NOTE — Discharge Instructions (Addendum)
Wylene Simmer, MD [facility]  Please read the following information regarding your care after surgery.  Medications  You only need a prescription for the narcotic pain medicine (ex. oxycodone, Percocet, Norco).  All of the other medicines listed below are available over the counter. X Aleve 2 pills twice a day for the first 3 days after surgery. X acetominophen (Tylenol) 650 mg every 4-6 hours as you need for minor to moderate pain X oxycodone as prescribed for severe pain  Narcotic pain medicine (ex. oxycodone, Percocet, Vicodin) will cause constipation.  To prevent this problem, take the following medicines while you are taking any pain medicine. X docusate sodium (Colace) 100 mg twice a day X senna (Senokot) 2 tablets twice a day  X To help prevent blood clots, Take Xarelto every day for two weeks.  Then take a baby aspirin (81 mg) twice a day for a month.  You should also get up every hour while you are awake to move around.    Weight Bearing ? Bear weight when you are able on your operated leg or foot. ? Bear weight only on your operated foot in the post-op shoe. X Do not bear any weight on the operated leg or foot.  Cast / Splint / Dressing X Keep your splint, cast or dressing clean and dry.  Don't put anything (coat hanger, pencil, etc) down inside of it.  If it gets damp, use a hair dryer on the cool setting to dry it.  If it gets soaked, call the office to schedule an appointment for a cast change. ? Remove your dressing 3 days after surgery and cover the incisions with dry dressings.    After your dressing, cast or splint is removed; you may shower, but do not soak or scrub the wound.  Allow the water to run over it, and then gently pat it dry.  Swelling It is normal for you to have swelling where you had surgery.  To reduce swelling and pain, keep your toes above your nose for at least 3 days after surgery.  It may be necessary to keep your foot or leg elevated for several  weeks.  If it hurts, it should be elevated.  Follow Up Call my office at [phone] when you are discharged from the hospital or surgery center to schedule an appointment to be seen two weeks after surgery.  Call my office at [phone] if you develop a fever >101.5 F, nausea, vomiting, bleeding from the surgical site or severe pain.     Post Anesthesia Home Care Instructions  Activity: Get plenty of rest for the remainder of the day. A responsible individual must stay with you for 24 hours following the procedure.  For the next 24 hours, DO NOT: -Drive a car -Paediatric nurse -Drink alcoholic beverages -Take any medication unless instructed by your physician -Make any legal decisions or sign important papers.  Meals: Start with liquid foods such as gelatin or soup. Progress to regular foods as tolerated. Avoid greasy, spicy, heavy foods. If nausea and/or vomiting occur, drink only clear liquids until the nausea and/or vomiting subsides. Call your physician if vomiting continues.  Special Instructions/Symptoms: Your throat may feel dry or sore from the anesthesia or the breathing tube placed in your throat during surgery. If this causes discomfort, gargle with warm salt water. The discomfort should disappear within 24 hours.  If you had a scopolamine patch placed behind your ear for the management of post- operative nausea and/or vomiting:  1. The medication in the patch is effective for 72 hours, after which it should be removed.  Wrap patch in a tissue and discard in the trash. Wash hands thoroughly with soap and water. 2. You may remove the patch earlier than 72 hours if you experience unpleasant side effects which may include dry mouth, dizziness or visual disturbances. 3. Avoid touching the patch. Wash your hands with soap and water after contact with the patch.    Regional Anesthesia Blocks  1. Numbness or the inability to move the "blocked" extremity may last from 3-48  hours after placement. The length of time depends on the medication injected and your individual response to the medication. If the numbness is not going away after 48 hours, call your surgeon.  2. The extremity that is blocked will need to be protected until the numbness is gone and the  Strength has returned. Because you cannot feel it, you will need to take extra care to avoid injury. Because it may be weak, you may have difficulty moving it or using it. You may not know what position it is in without looking at it while the block is in effect.  3. For blocks in the legs and feet, returning to weight bearing and walking needs to be done carefully. You will need to wait until the numbness is entirely gone and the strength has returned. You should be able to move your leg and foot normally before you try and bear weight or walk. You will need someone to be with you when you first try to ensure you do not fall and possibly risk injury.  4. Bruising and tenderness at the needle site are common side effects and will resolve in a few days.  5. Persistent numbness or new problems with movement should be communicated to the surgeon or the [place] [phone] [place] [phone]).

## 2020-12-27 NOTE — Anesthesia Procedure Notes (Signed)
Anesthesia Regional Block: Popliteal block   Pre-Anesthetic Checklist: ,, timeout performed, Correct Patient, Correct Site, Correct Laterality, Correct Procedure, Correct Position, site marked, Risks and benefits discussed,  Surgical consent,  Pre-op evaluation,  At surgeon's request and post-op pain management  Laterality: Left  Prep: chloraprep       Needles:  Injection technique: Single-shot  Needle Type: Stimiplex     Needle Length: 9cm  Needle Gauge: 21     Additional Needles:   Procedures:,,,, ultrasound used (permanent image in chart),,,,  Narrative:  Start time: 12/27/2020 12:00 PM End time: 12/27/2020 12:05 PM Injection made incrementally with aspirations every 5 mL.  Performed by: Personally  Anesthesiologist: Lynda Rainwater, MD

## 2020-12-27 NOTE — Op Note (Addendum)
12/27/2020  3:31 PM  PATIENT:  Mary [name]  60 y.o. female  PRE-OPERATIVE DIAGNOSIS:  left ankle trimalleolar fracture/dislocation  POST-OPERATIVE DIAGNOSIS: 1.  Left ankle dislocation 2.  Left ankle trimalleolar fracture 3.  Left ankle syndesmosis sprain  Procedure(s): 1.  Open treatment of left ankle dislocation 2.  Open treatment of left ankle trimalleolar fracture with internal fixation and fixation of the posterior lip 3.  Stress examination of the left ankle under fluoroscopy 4.  Open treatment of left ankle syndesmosis sprain with internal fixation 5.  AP, mortise and lateral radiographs of the left ankle  SURGEON:  Wylene Simmer, MD  ASSISTANT: None  ANESTHESIA:   General, regional  EBL:  minimal   TOURNIQUET:   Total Tourniquet Time Documented: Thigh (Left) - 66 minutes Total: Thigh (Left) - 66 minutes  COMPLICATIONS:  None apparent  DISPOSITION:  Extubated, awake and stable to recovery.  INDICATION FOR PROCEDURE: The patient is a 60 year old female without significant past medical history.  She fell on the ice at work about 10 days ago injuring her left ankle.  She had a fracture dislocation and underwent closed reduction in the office.  She presents now for a planned return to the operating room for a staged procedure for open treatment of the ankle fracture dislocation with internal fixation.  The risks and benefits of the alternative treatment options have been discussed in detail.  The patient wishes to proceed with surgery and specifically understands risks of bleeding, infection, nerve damage, blood clots, need for additional surgery, amputation and death.  PROCEDURE IN DETAIL:  After pre operative consent was obtained, and the correct operative site was identified, the patient was brought to the operating room and placed supine on the OR table.  Anesthesia was administered.  Pre-operative antibiotics were administered.  A surgical timeout was taken.  The left  lower extremity was prepped and draped in standard sterile fashion with a tourniquet around the thigh.  The extremity was elevated and the tourniquet was inflated to 300 mmHg.  A longitudinal incision was made over the lateral malleolus.  Dissection was carried down through the subcutaneous tissues to the fracture site.  The fracture was cleaned of all hematoma and periosteum.  The fracture at the fibula was provisionally reduced and held with a K wire.  Attention was turned to the medial side of the ankle.  There was a fracture blister with an area of skin attenuation anteromedially.  This was avoided with a more posterior incision.  Dissection was carried sharply down through the subcutaneous tissues.  Care was taken to protect the saphenous vein.  The fracture site was cleaned of all hematoma.  There was a large piece of intervening periosteum and comminuted bone blocking the medial malleolus reduction.  This was all removed.  The medial malleolus fracture site was then irrigated copiously.  The fracture was reduced and provisionally pinned.  The tenaculum compressed the fracture site appropriately.  AP and lateral radiographs confirmed appropriate reduction of the medial malleolus fracture and appropriate position of both guidepins.  The posterior guidepin was overdrilled and a 4 mm partially-threaded cannulated screw was inserted.  This compressed the fracture site appropriately.  The anterior guidepin was overdrilled and removed.  A fully threaded 4 mm solid cancellous screw was inserted.  Both screws were noted to have adequate purchase.  Attention was returned to the lateral incision.  The K wire was removed.  The fracture was opened exposing the posterior malleolus fracture.  This was mobilized and reduced.  A large Weber tenaculum was placed around the fibula and compressed the fracture in a reduced position.  A K wire was then inserted percutaneously from anterior to posterior across the fracture  site.  A stab incision was made and blunt dissection carried down to the anterior tibia.  The guidewire was overdrilled.  A partially-threaded 4 oh cannulated screw was inserted and fixed to the posterior malleolus fracture appropriately.  The lateral malleolus was reduced and held with a lobster claw clamp.  A 3.5 mm fully threaded lag screw was inserted from anterior to posterior and compressed the fracture site appropriately.  A 7 hole one third tubular plate from the [employer] small frag set was contoured to fit the lateral malleolus.  It was secured proximally with 3 bicortical screws and distally with 3 unicortical screws.  AP, lateral and mortise radiographs confirmed appropriate reduction of all 3 malleolus fractures.  Stress examination was then performed.  There was significant widening of the medial clear space and instability evident at the syndesmosis.  The decision was made to proceed with syndesmosis fixation.  The syndesmosis was reduced under direct vision.  A drill hole was made across all 4 cortices of the distal fibula and tibia.  A zip tight suture button device was passed to the medial cortex and toggled appropriately.  It was tightened securely at the lateral incision.  AP, mortise and lateral radiographs confirmed appropriate reduction of the syndesmosis.  Both wounds were irrigated copiously and sprinkled with vancomycin powder.  The anterior wound was also irrigated.  Deep subcutaneous tissues were approximated with 2-0 Vicryl.  Skin incisions were closed with nylon.  Sterile dressings were applied followed by a well-padded short leg splint.  The tourniquet was released after application of the dressings.  The patient was awakened from anesthesia and transported to the recovery room in stable condition.   FOLLOW UP PLAN: Nonweightbearing on the left lower extremity.  Follow-up in the office in 2 weeks for suture removal and conversion to a short leg cast.  Plan 6 weeks  nonweightbearing immobilization.  Xarelto for DVT prophylaxi due to her h/o DVT.   RADIOGRAPHS: AP, mortise and lateral radiographs of the left ankle are obtained intraoperatively.  These show interval reduction and fixation of the trimalleolar ankle fracture in the syndesmosis sprain.  Hardware is appropriately positioned and of the appropriate lengths.  No other acute injuries are evident.

## 2020-12-27 NOTE — H&P (Signed)
Mary [name] is an 60 y.o. female.   Chief Complaint: Left ankle pain  HPI: The patient is a 60 year old female without significant past medical history.  She fell on the ice about 10 days ago injuring her left ankle.  She was found to have a trimalleolar fracture dislocation.  She presents now for operative treatment of this displaced and unstable left ankle injury.   Past Medical History:  Diagnosis Date  . Adenocarcinoma sigmoid colon 05/12/11   Stage III(pT2pN2b) descending sigmoid  . Arm DVT (deep venous thromboembolism), acute ([place]) 07/04/11   r/t PAC  . Blood transfusion   . Cancer of descending colon ([place]) 05/12/2011  . GERD (gastroesophageal reflux disease)   . History of migraine headaches   . Incisional hernia   . iron deficiency anemia   . Multiple hemangiomas    Liver  . 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 w/prolonged cold sensitivity--Oxaliplatin related  . Pneumothorax 07/19/11   Bilateral  . Renal cysts, acquired, bilateral   . Sleep apnea    does not use CPAP on regular basis    Past Surgical History:  Procedure Laterality Date  . ABDOMINAL HYSTERECTOMY  mid 20s   bleeding  . COLON SURGERY  05/17/11   Partial colectomy  . COLONOSCOPY  09/04/2014   One diminutive polyp at the recto sigmoid colon removed. Diverticulosis of the entire examined colon. Internal hemorrhoids.   Mar[facility] Kitchen HERNIA REPAIR  09/06/12   lap incisional hernia  . INSERTION OF MESH  09/06/2012   Procedure: INSERTION OF MESH;  Surgeon: Gwenyth Ober, MD;  Location: [facility];  Service: General;  Laterality: N/A;  . PORT-A-CATH REMOVAL  05/06/2012   Procedure: MINOR REMOVAL PORT-A-CATH;  Surgeon: Gwenyth Ober, MD;  Location: [facility];  Service: General;  Laterality: Right;  . PORTACATH PLACEMENT  07/20/11   Right side placed  . PORTACATH PLACEMENT     left side & right side  . VENTRAL HERNIA REPAIR  09/06/2012   Procedure: LAPAROSCOPIC VENTRAL HERNIA;  Surgeon: Gwenyth Ober,  MD;  Location: [facility];  Service: General;  Laterality: N/A;    Family History  Problem Relation Age of Onset  . Diabetes Mother   . Hypertension Mother   . Cancer Father        leukemia  . Cancer Maternal Uncle        lung  . Cancer Maternal Uncle        colon  . Colitis Maternal Uncle   . Cancer Maternal Uncle        prostate  . Cancer Brother        PROSTATE  . Other Brother        MVA  . Esophageal cancer Neg Hx   . Rectal cancer Neg Hx   . Stomach cancer Neg Hx    Social History:  reports that she has never smoked. She has never used smokeless tobacco. She reports that she does not drink alcohol and does not use drugs.  Allergies: No Known Allergies  Medications Prior to Admission  Medication Sig Dispense Refill  . oxyCODONE-acetaminophen (PERCOCET) 10-325 MG tablet Take 1 tablet by mouth every 4 (four) hours as needed for pain.    . pantoprazole (PROTONIX) 40 MG tablet Take 1 tablet (40 mg total) by mouth daily. 30 tablet 11  . polyethylene glycol (MIRALAX / GLYCOLAX) packet Take 17 g by mouth daily. (Patient taking differently: Take 17 g by mouth  as needed.) 14 each 0    No results found for this or any previous visit (from the past 37 hour(s)). No results found.  Review of Systems no recent fever, chills, nausea, vomiting or changes in her appetite  Blood pressure 128/81, pulse 70, temperature 98.4 F (36.9 C), temperature source Oral, resp. rate 19, height 5\' 11"  (1.803 m), weight 105.9 kg, SpO2 100 %. Physical Exam  Well-nourished well-developed woman in no apparent distress.  Alert and oriented x4.  Normal mood and affect.  Gait is nonweightbearing on the left.  Left ankle has gross valgus malalignment.  There are blisters at the medial ankle that have dried up.  Skin is otherwise healthy and intact.  Intact sensibility to light touch dorsally and plantarly at the forefoot.   Assessment/Plan Left ankle trimalleolar fracture dislocation -to the operating room  today for a planned return to the operating room for a staged procedure after reduction of her left ankle in clinic last week.The risks and benefits of the alternative treatment options have been discussed in detail.  The patient wishes to proceed with surgery and specifically understands risks of bleeding, infection, nerve damage, blood clots, need for additional surgery, amputation and death.   Wylene Simmer, MD Jan 24, 2021, 1:20 PM

## 2020-12-27 NOTE — Anesthesia Postprocedure Evaluation (Signed)
Anesthesia Post Note  Patient: Mary [name]  Procedure(s) Performed: OPEN REDUCTION INTERNAL FIXATION (ORIF) Left ankle trimalleolar fracture dislocation (Left Ankle)     Patient location during evaluation: PACU Anesthesia Type: General Level of consciousness: awake Pain management: pain level controlled Vital Signs Assessment: post-procedure vital signs reviewed and stable Respiratory status: spontaneous breathing and respiratory function stable Cardiovascular status: stable Postop Assessment: no apparent nausea or vomiting Anesthetic complications: no   No complications documented.  Last Vitals:  Vitals:   12/27/20 1600 12/27/20 1610  BP: 132/67   Pulse: 67 65  Resp: 14 15  Temp:    SpO2: 100% 100%    Last Pain:  Vitals:   12/27/20 1610  TempSrc:   PainSc: 0-No pain                 Merlinda Frederick

## 2020-12-27 NOTE — Anesthesia Procedure Notes (Signed)
Anesthesia Regional Block: Adductor canal block   Pre-Anesthetic Checklist: ,, timeout performed, Correct Patient, Correct Site, Correct Laterality, Correct Procedure, Correct Position, site marked, Risks and benefits discussed,  Surgical consent,  Pre-op evaluation,  At surgeon's request and post-op pain management  Laterality: Left  Prep: chloraprep       Needles:  Injection technique: Single-shot  Needle Type: Stimiplex     Needle Length: 9cm  Needle Gauge: 21     Additional Needles:   Procedures:,,,, ultrasound used (permanent image in chart),,,,  Narrative:  Start time: 12/27/2020 12:00 PM End time: 12/27/2020 12:05 PM Injection made incrementally with aspirations every 5 mL.  Performed by: Personally  Anesthesiologist: Lynda Rainwater, MD

## 2020-12-27 NOTE — Transfer of Care (Signed)
Immediate Anesthesia Transfer of Care Note  Patient: Mary [name]  Procedure(s) Performed: OPEN REDUCTION INTERNAL FIXATION (ORIF) Left ankle trimalleolar fracture dislocation (Left Ankle)  Patient Location: PACU  Anesthesia Type:General and GA combined with regional for post-op pain  Level of Consciousness: sedated  Airway & Oxygen Therapy: Patient Spontanous Breathing and Patient connected to face mask oxygen  Post-op Assessment: Report given to RN and Post -op Vital signs reviewed and stable  Post vital signs: Reviewed and stable  Last Vitals:  Vitals Value Taken Time  BP    Temp    Pulse    Resp    SpO2      Last Pain:  Vitals:   12/27/20 1130  TempSrc: Oral  PainSc: 8       Patients Stated Pain Goal: 4 (31/12/16 2446)  Complications: No complications documented.

## 2020-12-28 NOTE — Addendum Note (Signed)
Addendum  created 12/28/20 1143 by Maryella Shivers, CRNA   Charge Capture section accepted

## 2020-12-31 ENCOUNTER — Telehealth: Payer: Self-pay | Admitting: Internal Medicine

## 2020-12-31 ENCOUNTER — Encounter (HOSPITAL_BASED_OUTPATIENT_CLINIC_OR_DEPARTMENT_OTHER): Payer: Self-pay | Admitting: Orthopedic Surgery

## 2020-12-31 NOTE — Telephone Encounter (Signed)
gabapentin (NEURONTIN) 300 MG capsule [[id number]]  [facility] [place] Phone:  [phone]  Fax:  [phone]    Last seen- 02.08.22 Next apt- n/a

## 2021-01-01 NOTE — Telephone Encounter (Signed)
gabapentin (NEURONTIN) 300 MG capsule [[id number]]  [place] [place] Phone:  [phone]  Fax:  [phone]    Last seen- 02.08.22 Next apt- n/a

## 2021-01-01 NOTE — Telephone Encounter (Signed)
Patient scheduled for virtual on 2.28.2022.

## 2021-01-01 NOTE — Telephone Encounter (Signed)
Noted  

## 2021-01-07 ENCOUNTER — Encounter: Payer: Self-pay | Admitting: Internal Medicine

## 2021-01-07 ENCOUNTER — Telehealth (INDEPENDENT_AMBULATORY_CARE_PROVIDER_SITE_OTHER): Payer: Self-pay | Admitting: Internal Medicine

## 2021-01-07 DIAGNOSIS — G6289 Other specified polyneuropathies: Secondary | ICD-10-CM

## 2021-01-07 DIAGNOSIS — T451X5A Adverse effect of antineoplastic and immunosuppressive drugs, initial encounter: Secondary | ICD-10-CM

## 2021-01-07 DIAGNOSIS — G620 Drug-induced polyneuropathy: Secondary | ICD-10-CM

## 2021-01-07 MED ORDER — GABAPENTIN 300 MG PO CAPS: 300.0000 mg | ORAL_CAPSULE | Freq: Three times a day (TID) | ORAL | 3 refills | Status: DC

## 2021-01-07 NOTE — Assessment & Plan Note (Signed)
Some concern that this is changing currently and may be related to other unknown medical problems. She has taken gabapentin 300 mg for this pain in the past and this is prescribed today. Counseled about titration with daily for 3 days, then BID for 3 days, then up to TID prn afterwards. Discussed main side effects of sedation and drowsiness or confusion. Checking HgA1c (note on chart review these have been elevated in the past) and B12, TSH, vitamin D. Treat as appropriate.

## 2021-01-07 NOTE — Progress Notes (Signed)
Virtual Visit via Video Note  I connected with Mary [name] on 01/07/21 at  8:00 AM EST by a video enabled telemedicine application and verified that I am speaking with the correct person using two identifiers.  The patient and the provider were at separate locations throughout the entire encounter. Patient location: home, Provider location: work   I discussed the limitations of evaluation and management by telemedicine and the availability of in person appointments. The patient expressed understanding and agreed to proceed. The patient and the provider were the only parties present for the visit unless noted in HPI below.  History of Present Illness: The patient is a 60 y.o. female with visit for neuropathy. Has had this in the past and used to be on gabapentin, no routine care here since 2019 so she does not have this for some years. She has not had routine labs for some time also. Had CMP and CBC and CEA prior to ankle surgery recently beginning of February. She is having neuropathy in both feet now (used to be just one). Denies changes other than struggling with recent ankle fracture and surgery. Going soon for 1st post-op visit and currently is non-weight bearing on that leg. Would like to try gabapentin again for the neuropathy pain.  Observations/Objective: Appearance: normal, breathing appears normal, casual grooming, abdomen does not appear distended, throat not well visualized, mental status is A and O times 3  Assessment and Plan: See problem oriented charting  Follow Up Instructions: ordered labs to assess for cause of worsening neuropathy and rx gabapentin 300 mg  I discussed the assessment and treatment plan with the patient. The patient was provided an opportunity to ask questions and all were answered. The patient agreed with the plan and demonstrated an understanding of the instructions.   The patient was advised to call back or seek an in-person evaluation if the symptoms worsen  or if the condition fails to improve as anticipated.  Hoyt Koch, MD

## 2021-01-15 ENCOUNTER — Ambulatory Visit (HOSPITAL_BASED_OUTPATIENT_CLINIC_OR_DEPARTMENT_OTHER)
Admission: RE | Admit: 2021-01-15 | Discharge: 2021-01-15 | Disposition: A | Discharge status: Home / Self Care | Payer: 59 | Source: Ambulatory Visit | Attending: Gastroenterology | Admitting: Gastroenterology

## 2021-01-15 ENCOUNTER — Encounter (HOSPITAL_BASED_OUTPATIENT_CLINIC_OR_DEPARTMENT_OTHER): Payer: Self-pay

## 2021-01-15 ENCOUNTER — Other Ambulatory Visit: Payer: Self-pay

## 2021-01-15 DIAGNOSIS — Z85038 Personal history of other malignant neoplasm of large intestine: Secondary | ICD-10-CM

## 2021-01-15 DIAGNOSIS — K5732 Diverticulitis of large intestine without perforation or abscess without bleeding: Secondary | ICD-10-CM | POA: Insufficient documentation

## 2021-01-15 DIAGNOSIS — K439 Ventral hernia without obstruction or gangrene: Secondary | ICD-10-CM | POA: Insufficient documentation

## 2021-01-15 DIAGNOSIS — D1803 Hemangioma of intra-abdominal structures: Secondary | ICD-10-CM | POA: Insufficient documentation

## 2021-01-15 DIAGNOSIS — R1032 Left lower quadrant pain: Secondary | ICD-10-CM | POA: Diagnosis present

## 2021-01-15 MED ORDER — IOHEXOL 300 MG/ML  SOLN
100.0000 mL | Freq: Once | INTRAMUSCULAR | Status: AC | PRN
  Administered 2021-01-15: 100 mL via INTRAVENOUS
  Filled 2021-01-15: qty 100

## 2021-01-22 ENCOUNTER — Telehealth: Payer: Self-pay | Admitting: Gastroenterology

## 2021-01-22 ENCOUNTER — Other Ambulatory Visit: Payer: Self-pay | Admitting: Gastroenterology

## 2021-01-22 DIAGNOSIS — R112 Nausea with vomiting, unspecified: Secondary | ICD-10-CM

## 2021-01-22 MED ORDER — METRONIDAZOLE 500 MG PO TABS: 500.0000 mg | ORAL_TABLET | Freq: Three times a day (TID) | ORAL | 0 refills | Status: DC

## 2021-01-22 MED ORDER — ONDANSETRON HCL 4 MG PO TABS: 4.0000 mg | ORAL_TABLET | Freq: Three times a day (TID) | ORAL | 0 refills | Status: DC | PRN

## 2021-01-22 MED ORDER — CIPROFLOXACIN HCL 500 MG PO TABS: 500.0000 mg | ORAL_TABLET | Freq: Two times a day (BID) | ORAL | 0 refills | Status: DC

## 2021-01-22 NOTE — Telephone Encounter (Signed)
Pt is requesting a call back regarding her CT scan results 

## 2021-01-22 NOTE — Telephone Encounter (Signed)
Spoke with pt and she is aware of results, scripts sent to pharmacy.

## 2021-01-22 NOTE — Telephone Encounter (Signed)
CT Abdomen and pelvis  shows evidence of mild diverticulitis. Please send prescription for oral Cipro 500mg  BID and flagyl 500mg  TID X 7 days. Call with any change or worsening symptoms.

## 2021-01-22 NOTE — Telephone Encounter (Signed)
Pt had CT of abd/pelvis on 3/8 and is calling for results. Please advise as DOD.

## 2021-03-15 ENCOUNTER — Telehealth: Payer: Self-pay

## 2021-03-15 NOTE — Telephone Encounter (Signed)
Fine with me

## 2021-03-15 NOTE — Telephone Encounter (Signed)
Pt has come to the [employer] with her husband today and wanted to change her primary care provider to Mary [name] at the [facility]. Pt stated that this location was better for her.   Please advise if this is okay with both parties if the patient transfers.

## 2021-03-15 NOTE — Telephone Encounter (Signed)
Okay thanks. I have called pt and made her an [facility] visit for Mary [name] on 05/17/21 @ 9am pt confirmed the appointment.

## 2021-05-17 ENCOUNTER — Ambulatory Visit: Payer: 59 | Admitting: Family

## 2021-07-20 ENCOUNTER — Other Ambulatory Visit: Payer: Self-pay | Admitting: Internal Medicine

## 2021-07-30 ENCOUNTER — Ambulatory Visit (HOSPITAL_BASED_OUTPATIENT_CLINIC_OR_DEPARTMENT_OTHER)
Admission: RE | Admit: 2021-07-30 | Discharge: 2021-07-30 | Disposition: A | Discharge status: Home / Self Care | Payer: 59 | Source: Ambulatory Visit | Attending: Family | Admitting: Family

## 2021-07-30 ENCOUNTER — Encounter: Payer: Self-pay | Admitting: Family

## 2021-07-30 ENCOUNTER — Encounter (HOSPITAL_BASED_OUTPATIENT_CLINIC_OR_DEPARTMENT_OTHER): Payer: Self-pay

## 2021-07-30 ENCOUNTER — Ambulatory Visit: Payer: 59 | Admitting: Family

## 2021-07-30 ENCOUNTER — Other Ambulatory Visit: Payer: Self-pay | Admitting: Family

## 2021-07-30 ENCOUNTER — Other Ambulatory Visit: Payer: Self-pay

## 2021-07-30 VITALS — BP 154/80 | HR 72 | Temp 96.7°F | Ht 71.0 in | Wt 259.4 lb

## 2021-07-30 DIAGNOSIS — R7309 Other abnormal glucose: Secondary | ICD-10-CM | POA: Diagnosis not present

## 2021-07-30 DIAGNOSIS — E538 Deficiency of other specified B group vitamins: Secondary | ICD-10-CM

## 2021-07-30 DIAGNOSIS — R232 Flushing: Secondary | ICD-10-CM | POA: Diagnosis not present

## 2021-07-30 DIAGNOSIS — Z1231 Encounter for screening mammogram for malignant neoplasm of breast: Secondary | ICD-10-CM | POA: Insufficient documentation

## 2021-07-30 DIAGNOSIS — R519 Headache, unspecified: Secondary | ICD-10-CM | POA: Diagnosis not present

## 2021-07-30 LAB — COMPREHENSIVE METABOLIC PANEL
ALT: 19 U/L (ref 0–35)
AST: 15 U/L (ref 0–37)
Albumin: 3.9 g/dL (ref 3.5–5.2)
Alkaline Phosphatase: 97 U/L (ref 39–117)
BUN: 20 mg/dL (ref 6–23)
CO2: 27 mEq/L (ref 19–32)
Calcium: 9.2 mg/dL (ref 8.4–10.5)
Chloride: 105 mEq/L (ref 96–112)
Creatinine, Ser: 1.38 mg/dL — ABNORMAL HIGH (ref 0.40–1.20)
GFR: 41.73 mL/min — ABNORMAL LOW (ref >60.00)
Glucose, Bld: 117 mg/dL — ABNORMAL HIGH (ref 70–99)
Potassium: 4 mEq/L (ref 3.5–5.1)
Sodium: 139 mEq/L (ref 135–145)
Total Bilirubin: 0.4 mg/dL (ref 0.2–1.2)
Total Protein: 6.5 g/dL (ref 6.0–8.3)

## 2021-07-30 LAB — CBC WITH DIFFERENTIAL/PLATELET
Basophils Absolute: 0 10*3/uL (ref 0.0–0.1)
Basophils Relative: 0.7 % (ref 0.0–3.0)
Eosinophils Absolute: 0.1 10*3/uL (ref 0.0–0.7)
Eosinophils Relative: 1.1 % (ref 0.0–5.0)
HCT: 39.2 % (ref 36.0–46.0)
Hemoglobin: 12.1 g/dL (ref 12.0–15.0)
Lymphocytes Relative: 23 % (ref 12.0–46.0)
Lymphs Abs: 1.6 10*3/uL (ref 0.7–4.0)
MCHC: 30.9 g/dL (ref 30.0–36.0)
MCV: 71.8 fl — ABNORMAL LOW (ref 78.0–100.0)
Monocytes Absolute: 0.3 10*3/uL (ref 0.1–1.0)
Monocytes Relative: 4.8 % (ref 3.0–12.0)
Neutro Abs: 4.9 10*3/uL (ref 1.4–7.7)
Neutrophils Relative %: 70.4 % (ref 43.0–77.0)
Platelets: 213 10*3/uL (ref 150.0–400.0)
RBC: 5.46 Mil/uL — ABNORMAL HIGH (ref 3.87–5.11)
RDW: 18.4 % — ABNORMAL HIGH (ref 11.5–15.5)
WBC: 7 10*3/uL (ref 4.0–10.5)

## 2021-07-30 LAB — VITAMIN B12: Vitamin B-12: 256 pg/mL (ref 211–911)

## 2021-07-30 LAB — HEMOGLOBIN A1C: Hgb A1c MFr Bld: 6.8 % — ABNORMAL HIGH (ref 4.6–6.5)

## 2021-07-30 LAB — TSH: TSH: 1.44 u[IU]/mL (ref 0.35–5.50)

## 2021-07-30 NOTE — Progress Notes (Signed)
Mary [name] is a 60 y.o. female with the following history as recorded in EpicCare:  Patient Active Problem List   Diagnosis Date Noted   Left trimalleolar fracture, closed, initial encounter 12/18/2020   Left ankle pain 12/18/2020   Labial cyst 03/05/2018   Chest pain 05/30/2017   Chronic nonintractable headache 05/04/2017   Ankle edema, bilateral 05/04/2017   Cellulitis 04/04/2016   Abnormal x-ray 04/04/2016   Hearing loss due to cerumen impaction 01/14/2016   Impaired fasting blood sugar 01/14/2016   Plantar fasciitis, right 11/22/2015   Right calcaneal fracture 08/15/2015   OSA (obstructive sleep apnea) 09/26/2014   Peripheral neuropathy due to chemotherapy ([place]) 06/05/2014   Obesity 06/05/2014   history of DVT (deep venous thrombosis) 02/20/2014   GERD (gastroesophageal reflux disease) 02/20/2014   Hernia 09/22/2012   Iron deficiency anemia, unspecified 08/30/2011   Colon cancer ([place]) 08/29/2011   Cancer of descending colon ([place]) 05/12/2011    Current Outpatient Medications  Medication Sig Dispense Refill   gabapentin (NEURONTIN) 300 MG capsule TAKE 1 CAPSULE BY MOUTH THREE TIMES DAILY 90 capsule 0   pantoprazole (PROTONIX) 40 MG tablet Take 1 tablet (40 mg total) by mouth daily. 30 tablet 11   polyethylene glycol (MIRALAX / GLYCOLAX) packet Take 17 g by mouth daily. (Patient taking differently: Take 17 g by mouth as needed.) 14 each 0   No current facility-administered medications for this visit.    Allergies: Patient has no known allergies.  Past Medical History:  Diagnosis Date   Adenocarcinoma sigmoid colon 05/12/11   Stage III(pT2pN2b) descending sigmoid   Arm DVT (deep venous thromboembolism), acute ([place]) 07/04/11   r/t PAC   Blood transfusion    Cancer of descending colon ([place]) 05/12/2011   Diverticulitis    GERD (gastroesophageal reflux disease)    History of migraine headaches    Incisional hernia    iron deficiency anemia    Multiple hemangiomas     Liver   Neuropathy due to drug ([place]) 2012   Early--assoc. w/prolonged cold sensitivity--Oxaliplatin related   Pneumothorax 07/19/11   Bilateral   Renal cysts, acquired, bilateral    Sleep apnea    does not use CPAP on regular basis    Past Surgical History:  Procedure Laterality Date   ABDOMINAL HYSTERECTOMY  mid 20s   bleeding   COLON SURGERY  05/17/11   Partial colectomy   COLONOSCOPY  09/04/2014   One diminutive polyp at the recto sigmoid colon removed. Diverticulosis of the entire examined colon. Internal hemorrhoids.    HERNIA REPAIR  09/06/12   lap incisional hernia   INSERTION OF MESH  09/06/2012   Procedure: INSERTION OF MESH;  Surgeon: Gwenyth Ober, MD;  Location: [place];  Service: General;  Laterality: N/A;   ORIF ANKLE FRACTURE Left 12/27/2020   Procedure: OPEN REDUCTION INTERNAL FIXATION (ORIF) Left ankle trimalleolar fracture dislocation;  Surgeon: Wylene Simmer, MD;  Location: [place];  Service: Orthopedics;  Laterality: Left;  54mn   PORT-A-CATH REMOVAL  05/06/2012   Procedure: MINOR REMOVAL PORT-A-CATH;  Surgeon: JGwenyth Ober MD;  [place]  Service: General;  Laterality: Right;   PORTACATH PLACEMENT  07/20/11   Right side placed   PORTACATH PLACEMENT     left side & right side   VENTRAL HERNIA REPAIR  09/06/2012   Procedure: LAPAROSCOPIC VENTRAL HERNIA;  Surgeon: JGwenyth Ober MD;  [place]  Service: General;  Laterality: N/A;  Family History  Problem Relation Age of Onset   Diabetes Mother    Hypertension Mother    Cancer Father        leukemia   Cancer Maternal Uncle        lung   Cancer Maternal Uncle        colon   Colitis Maternal Uncle    Cancer Maternal Uncle        prostate   Cancer Brother        PROSTATE   Other Brother        MVA   Esophageal cancer Neg Hx    Rectal cancer Neg Hx    Stomach cancer Neg Hx     Social History   Tobacco Use   Smoking status: Never   Smokeless tobacco:  Never  Substance Use Topics   Alcohol use: No    Subjective:  Presents to TOC from previous PCP; History of migraine headaches- has taken Topamax in the past but notes that headaches have been gone "for years." According to records, took Topamax in 2017-2018 and has been headache free until February of this year;  Notes that she wakes up with headache- by mid-day headache is gone;  Notes that she has been having hot flashes for 1 year;  CT in March that was clear- history of colon cancer in 2012;      Objective:  Vitals:   07/30/21 0900  BP: (!) 154/80  Pulse: 72  Temp: (!) 96.7 F (35.9 C)  TempSrc: Temporal  SpO2: 95%  Weight: 259 lb 6.4 oz (117.7 kg)  Height: _0  (1.803 m)    General: Well developed, well nourished, in no acute distress  Skin : Warm and dry.  Head: Normocephalic and atraumatic  Eyes: Sclera and conjunctiva clear; pupils round and reactive to light; extraocular movements intact  Ears: External normal; canals clear; tympanic membranes normal  Oropharynx: Pink, supple. No suspicious lesions  Neck: Supple without thyromegaly, adenopathy  Lungs: Respirations unlabored; clear to auscultation bilaterally without wheeze, rales, rhonchi  CVS exam: normal rate and regular rhythm.  Neurologic: Alert and oriented; speech intact; face symmetrical; moves all extremities well; CNII-XII intact without focal deficit   Assessment:  1. Nonintractable headache, unspecified chronicity pattern, unspecified headache type   2. Elevated glucose   3. Hot flashes   4. Low vitamin B12 level   5. Visit for screening mammogram     Plan:  Concern for recurrence of headaches- has been headache free x 4 years; ? Related to elevated blood pressure; she is to start checking her blood pressure regularly and bring readings/ cuff to her next appointment; will also need to consider MRI based on lab results; do not feel comfortable starting Topamax without further evaluation.  Check  hgba1c; ? Related to elevated blood pressure;  Check B12 level today; Order for mammogram updated;   This visit occurred during the SARS-CoV-2 public health emergency.  Safety protocols were in place, including screening questions prior to the visit, additional usage of staff PPE, and extensive cleaning of exam room while observing appropriate contact time as indicated for disinfecting solutions.    Return in about 3 weeks (around 08/20/2021).  Orders Placed This Encounter  Procedures   CBC with Differential/Platelet   Comp Met (CMET)   B12   Hemoglobin A1c   TSH    Requested Prescriptions    No prescriptions requested or ordered in this encounter

## 2021-08-06 ENCOUNTER — Other Ambulatory Visit: Payer: Self-pay | Admitting: Family

## 2021-08-06 ENCOUNTER — Other Ambulatory Visit: Payer: Self-pay | Admitting: Internal Medicine

## 2021-08-06 ENCOUNTER — Telehealth: Payer: Self-pay

## 2021-08-06 DIAGNOSIS — R928 Other abnormal and inconclusive findings on diagnostic imaging of breast: Secondary | ICD-10-CM

## 2021-08-06 NOTE — Telephone Encounter (Signed)
Pt has called the office to get her results from her Mammogram. I have informed her that the Bi-Rad test was incomplete and they will need additional imaging on the right breast. Another order has been placed and they will call her to get it scheduled. Pt reports understanding and will wait for the call.

## 2021-08-10 HISTORY — PX: BREAST BIOPSY: SHX20

## 2021-08-23 ENCOUNTER — Ambulatory Visit: Payer: 59 | Admitting: Family

## 2021-08-23 ENCOUNTER — Encounter: Payer: Self-pay | Admitting: Family

## 2021-08-23 ENCOUNTER — Other Ambulatory Visit: Payer: Self-pay

## 2021-08-23 VITALS — BP 134/78 | HR 77 | Temp 98.1°F | Ht 71.0 in | Wt 254.2 lb

## 2021-08-23 DIAGNOSIS — E119 Type 2 diabetes mellitus without complications: Secondary | ICD-10-CM

## 2021-08-23 DIAGNOSIS — Z6835 Body mass index (BMI) 35.0-35.9, adult: Secondary | ICD-10-CM

## 2021-08-23 DIAGNOSIS — E538 Deficiency of other specified B group vitamins: Secondary | ICD-10-CM

## 2021-08-23 DIAGNOSIS — Z23 Encounter for immunization: Secondary | ICD-10-CM | POA: Diagnosis not present

## 2021-08-23 MED ORDER — CYANOCOBALAMIN 1000 MCG/ML IJ SOLN
1000.0000 ug | Freq: Once | INTRAMUSCULAR | Status: DC
  Administered 2021-08-23: 1000 ug via INTRAMUSCULAR

## 2021-08-23 MED ORDER — CYANOCOBALAMIN 1000 MCG/ML IJ SOLN
1000.0000 ug | Freq: Once | INTRAMUSCULAR | Status: AC
  Administered 2021-12-12: 1000 ug via INTRAMUSCULAR

## 2021-08-23 NOTE — Addendum Note (Signed)
Addended by: Kittie Plater, Konnar Ben HUA on: 08/23/2021 01:57 PM   Modules accepted: Orders

## 2021-08-23 NOTE — Progress Notes (Signed)
Mary [name] is a 60 y.o. female with the following history as recorded in EpicCare:  Patient Active Problem List   Diagnosis Date Noted   Left trimalleolar fracture, closed, initial encounter 12/18/2020   Left ankle pain 12/18/2020   Labial cyst 03/05/2018   Chest pain 05/30/2017   Chronic nonintractable headache 05/04/2017   Ankle edema, bilateral 05/04/2017   Cellulitis 04/04/2016   Abnormal x-ray 04/04/2016   Hearing loss due to cerumen impaction 01/14/2016   Impaired fasting blood sugar 01/14/2016   Plantar fasciitis, right 11/22/2015   Right calcaneal fracture 08/15/2015   OSA (obstructive sleep apnea) 09/26/2014   Peripheral neuropathy due to chemotherapy ([place]) 06/05/2014   Obesity 06/05/2014   history of DVT (deep venous thrombosis) 02/20/2014   GERD (gastroesophageal reflux disease) 02/20/2014   Hernia 09/22/2012   Iron deficiency anemia, unspecified 08/30/2011   Colon cancer ([place]) 08/29/2011   Cancer of descending colon ([place]) 05/12/2011    Current Outpatient Medications  Medication Sig Dispense Refill   gabapentin (NEURONTIN) 300 MG capsule TAKE 1 CAPSULE BY MOUTH THREE TIMES DAILY 90 capsule 0   pantoprazole (PROTONIX) 40 MG tablet Take 1 tablet (40 mg total) by mouth daily. 30 tablet 11   polyethylene glycol (MIRALAX / GLYCOLAX) packet Take 17 g by mouth daily. (Patient taking differently: Take 17 g by mouth as needed.) 14 each 0   Current Facility-Administered Medications  Medication Dose Route Frequency Provider Last Rate Last Admin   cyanocobalamin ((VITAMIN B-12)) injection 1,000 mcg  1,000 mcg Intramuscular Once Marrian Salvage, FNP        Allergies: Patient has no known allergies.  Past Medical History:  Diagnosis Date   Adenocarcinoma sigmoid colon 05/12/11   Stage III(pT2pN2b) descending sigmoid   Arm DVT (deep venous thromboembolism), acute ([place]) 07/04/11   r/t PAC   Blood transfusion    Cancer of descending colon ([place]) 05/12/2011    Diverticulitis    GERD (gastroesophageal reflux disease)    History of migraine headaches    Incisional hernia    iron deficiency anemia    Multiple hemangiomas    Liver   Neuropathy due to drug ([place]) 2012   Early--assoc. w/prolonged cold sensitivity--Oxaliplatin related   Pneumothorax 07/19/11   Bilateral   Renal cysts, acquired, bilateral    Sleep apnea    does not use CPAP on regular basis    Past Surgical History:  Procedure Laterality Date   ABDOMINAL HYSTERECTOMY  mid 20s   bleeding   COLON SURGERY  05/17/11   Partial colectomy   COLONOSCOPY  09/04/2014   One diminutive polyp at the recto sigmoid colon removed. Diverticulosis of the entire examined colon. Internal hemorrhoids.    HERNIA REPAIR  09/06/12   lap incisional hernia   INSERTION OF MESH  09/06/2012   Procedure: INSERTION OF MESH;  Surgeon: Gwenyth Ober, MD;  Location: [place];  Service: General;  Laterality: N/A;   ORIF ANKLE FRACTURE Left 12/27/2020   Procedure: OPEN REDUCTION INTERNAL FIXATION (ORIF) Left ankle trimalleolar fracture dislocation;  Surgeon: Wylene Simmer, MD;  Location: [place];  Service: Orthopedics;  Laterality: Left;  28min   PORT-A-CATH REMOVAL  05/06/2012   Procedure: MINOR REMOVAL PORT-A-CATH;  Surgeon: Gwenyth Ober, MD;  Location: [place];  Service: General;  Laterality: Right;   PORTACATH PLACEMENT  07/20/11   Right side placed   PORTACATH PLACEMENT     left side & right side   VENTRAL HERNIA  REPAIR  09/06/2012   Procedure: LAPAROSCOPIC VENTRAL HERNIA;  Surgeon: Gwenyth Ober, MD;  Location: [place];  Service: General;  Laterality: N/A;    Family History  Problem Relation Age of Onset   Diabetes Mother    Hypertension Mother    Cancer Father        leukemia   Cancer Maternal Uncle        lung   Cancer Maternal Uncle        colon   Colitis Maternal Uncle    Cancer Maternal Uncle        prostate   Cancer Brother        PROSTATE   Other Brother         MVA   Esophageal cancer Neg Hx    Rectal cancer Neg Hx    Stomach cancer Neg Hx     Social History   Tobacco Use   Smoking status: Never   Smokeless tobacco: Never  Substance Use Topics   Alcohol use: No    Subjective:   3 week follow up on headaches/ chronic care needs; Labs indicated B12 at low end of normal/ diet controlled diabetes;  Has been monitoring blood pressure at home- readings have been normal;  Would like to be referred to weight loss surgeon to discuss surgical options;    Objective:  Vitals:   08/23/21 1250  BP: 134/78  Pulse: 77  Temp: 98.1 F (36.7 C)  TempSrc: Oral  SpO2: 99%  Weight: 254 lb 3.2 oz (115.3 kg)  Height: 5\' 11"  (1.803 m)    General: Well developed, well nourished, in no acute distress  Skin : Warm and dry.  Head: Normocephalic and atraumatic  Lungs: Respirations unlabored; clear to auscultation bilaterally without wheeze, rales, rhonchi  Extremities: No edema, cyanosis, clubbing  Vessels: Symmetric bilaterally  Neurologic: Alert and oriented; speech intact; face symmetrical; moves all extremities well; CNII-XII intact without focal deficit   Assessment:  1. Type 2 diabetes, diet controlled ([place])   2. Low vitamin B12 level   3. Class 2 severe obesity with serious comorbidity and body mass index (BMI) of 35.0 to 35.9 in adult, unspecified obesity type ([place])     Plan:  Discussed medication but patient defers at this time; she has lost 5 pounds since last OV and will keep working on limiting intake of refined sugars; re-check in 4-6 months; Discontinue OTC B12 gummies; patient prefers injections- B12 given today/ repeat in 2 weeks; will most likely need to continue at least every 1-2 months for maintenance.  Refer to weight loss surgeon per patient request;   Flu shot given;  This visit occurred during the SARS-CoV-2 public health emergency.  Safety protocols were in place, including screening questions prior to the visit, additional  usage of staff PPE, and extensive cleaning of exam room while observing appropriate contact time as indicated for disinfecting solutions.    Return in about 2 weeks (around 09/06/2021) for B12/ repeat lab.  Orders Placed This Encounter  Procedures   Ambulatory referral to General Surgery    Referral Priority:   Routine    Referral Type:   Surgical    Referral Reason:   Specialty Services Required    Requested Specialty:   General Surgery    Number of Visits Requested:   1    Requested Prescriptions    No prescriptions requested or ordered in this encounter

## 2021-08-24 ENCOUNTER — Ambulatory Visit
Admission: RE | Admit: 2021-08-24 | Discharge: 2021-08-24 | Disposition: A | Discharge status: Home / Self Care | Payer: 59 | Source: Ambulatory Visit | Attending: Family | Admitting: Family

## 2021-08-24 ENCOUNTER — Other Ambulatory Visit: Payer: Self-pay | Admitting: Family

## 2021-08-24 DIAGNOSIS — R928 Other abnormal and inconclusive findings on diagnostic imaging of breast: Secondary | ICD-10-CM

## 2021-08-28 ENCOUNTER — Telehealth: Payer: Self-pay

## 2021-08-28 NOTE — Telephone Encounter (Signed)
Pt called stating she wanted provider to be aware supplier of c-pap supplies/machine would be sending over paperwork for new supplies.  Pt was advised I would pass this information along to PCP.

## 2021-08-30 NOTE — Telephone Encounter (Signed)
I have called pt back and informed her that we have not received any paperwork about CPAP. I have informed her that we typically do not write the order or supplies because there is a lot more that goes into it then just witting an order. Insurance will be more inclined to pay for those supplies when it comes to from sleep clinic or pulmonology. Pt reported understanding.

## 2021-09-04 ENCOUNTER — Other Ambulatory Visit: Payer: Self-pay | Admitting: Family

## 2021-09-04 ENCOUNTER — Ambulatory Visit
Admission: RE | Admit: 2021-09-04 | Discharge: 2021-09-04 | Disposition: A | Discharge status: Home / Self Care | Payer: 59 | Source: Ambulatory Visit | Attending: Family | Admitting: Family

## 2021-09-04 ENCOUNTER — Other Ambulatory Visit: Payer: Self-pay

## 2021-09-04 ENCOUNTER — Telehealth: Payer: Self-pay | Admitting: Family

## 2021-09-04 DIAGNOSIS — R928 Other abnormal and inconclusive findings on diagnostic imaging of breast: Secondary | ICD-10-CM

## 2021-09-04 NOTE — Telephone Encounter (Signed)
Mary [name] called regarding cpap machine. Advised her that pt was informed to contact her pulmonologist to fill out pw.

## 2021-09-04 NOTE — Telephone Encounter (Signed)
Supplier called to ask if we had received the faxed papers for the c-pap supplies/machine. I informed her that the patient was advised to reach out to her pulmonologist/sleep clinic person to fill out those papers. Rep stated she would call patient.

## 2021-09-04 NOTE — Telephone Encounter (Signed)
Error

## 2021-09-12 ENCOUNTER — Ambulatory Visit: Payer: 59

## 2021-09-12 ENCOUNTER — Other Ambulatory Visit: Payer: Self-pay

## 2021-09-12 ENCOUNTER — Ambulatory Visit (INDEPENDENT_AMBULATORY_CARE_PROVIDER_SITE_OTHER): Payer: 59

## 2021-09-12 DIAGNOSIS — E538 Deficiency of other specified B group vitamins: Secondary | ICD-10-CM

## 2021-09-12 MED ORDER — CYANOCOBALAMIN 1000 MCG/ML IJ SOLN
1000.0000 ug | Freq: Once | INTRAMUSCULAR | Status: AC
  Administered 2021-09-12: 1000 ug via INTRAMUSCULAR

## 2021-09-12 NOTE — Progress Notes (Cosign Needed)
Mary [name] is a 60 y.o. female presents to the office today for B12 shot:  injections, per physician's orders. Original order: on ov note from 08-23-21 :  B12 given today/ repeat in 2 weeks; will most likely need to continue at least every 1-2 months for maintenance.  Cyanocobalamin (med), 1000 mg/ml (dose),  IM (route) was administered left deltoid (location) today. Patient tolerated injection.  Patient next injection due: in 1-2 months per ov note.  Appt made for 10-15-2021 for next injection.   Jiles Prows

## 2021-09-24 ENCOUNTER — Ambulatory Visit (INDEPENDENT_AMBULATORY_CARE_PROVIDER_SITE_OTHER): Payer: 59 | Admitting: Pulmonary Disease

## 2021-09-24 ENCOUNTER — Other Ambulatory Visit: Payer: Self-pay

## 2021-09-24 ENCOUNTER — Encounter: Payer: Self-pay | Admitting: Pulmonary Disease

## 2021-09-24 VITALS — BP 124/84 | HR 78 | Temp 98.3°F | Ht 71.0 in | Wt 259.0 lb

## 2021-09-24 DIAGNOSIS — G4733 Obstructive sleep apnea (adult) (pediatric): Secondary | ICD-10-CM | POA: Diagnosis not present

## 2021-09-24 DIAGNOSIS — G4719 Other hypersomnia: Secondary | ICD-10-CM

## 2021-09-24 NOTE — Addendum Note (Signed)
Addended by: Dessie Coma on: 09/24/2021 02:23 PM   Modules accepted: Orders

## 2021-09-24 NOTE — Patient Instructions (Signed)
Diagnosed history of mild obstructive sleep apnea  We will schedule you for a home sleep study We will update you with results as soon as reviewed  Contact the medical supply company to set you up with CPAP  Continue weight loss efforts   Call with significant concerns  Sleep Apnea Sleep apnea affects breathing during sleep. It causes breathing to stop for 10 seconds or more, or to become shallow. People with sleep apnea usually snore loudly. It can also increase the risk of: Heart attack. Stroke. Being very overweight (obese). Diabetes. Heart failure. Irregular heartbeat. High blood pressure. The goal of treatment is to help you breathe normally again. What are the causes? The most common cause of this condition is a collapsed or blocked airway. There are three kinds of sleep apnea: Obstructive sleep apnea. This is caused by a blocked or collapsed airway. Central sleep apnea. This happens when the brain does not send the right signals to the muscles that control breathing. Mixed sleep apnea. This is a combination of obstructive and central sleep apnea. What increases the risk? Being overweight. Smoking. Having a small airway. Being older. Being female. Drinking alcohol. Taking medicines to calm yourself (sedatives or tranquilizers). Having family members with the condition. Having a tongue or tonsils that are larger than normal. What are the signs or symptoms? Trouble staying asleep. Loud snoring. Headaches in the morning. Waking up gasping. Dry mouth or sore throat in the morning. Being sleepy or tired during the day. If you are sleepy or tired during the day, you may also: Not be able to focus your mind (concentrate). Forget things. Get angry a lot and have mood swings. Feel sad (depressed). Have changes in your personality. Have less interest in sex, if you are female. Be unable to have an erection, if you are female. How is this treated?  Sleeping on your  side. Using a medicine to get rid of mucus in your nose (decongestant). Avoiding the use of alcohol, medicines to help you relax, or certain pain medicines (narcotics). Losing weight, if needed. Changing your diet. Quitting smoking. Using a machine to open your airway while you sleep, such as: An oral appliance. This is a mouthpiece that shifts your lower jaw forward. A CPAP device. This device blows air through a mask when you breathe out (exhale). An EPAP device. This has valves that you put in each nostril. A BIPAP device. This device blows air through a mask when you breathe in (inhale) and breathe out. Having surgery if other treatments do not work. Follow these instructions at home: Lifestyle Make changes that your doctor recommends. Eat a healthy diet. Lose weight if needed. Avoid alcohol, medicines to help you relax, and some pain medicines. Do not smoke or use any products that contain nicotine or tobacco. If you need help quitting, ask your doctor. General instructions Take over-the-counter and prescription medicines only as told by your doctor. If you were given a machine to use while you sleep, use it only as told by your doctor. If you are having surgery, make sure to tell your doctor you have sleep apnea. You may need to bring your device with you. Keep all follow-up visits. Contact a doctor if: The machine that you were given to use during sleep bothers you or does not seem to be working. You do not get better. You get worse. Get help right away if: Your chest hurts. You have trouble breathing in enough air. You have an uncomfortable feeling in  your back, arms, or stomach. You have trouble talking. One side of your body feels weak. A part of your face is hanging down. These symptoms may be an emergency. Get help right away. Call your local emergency services (911 in the [place] Do not wait to see if the symptoms will go away. Do not drive yourself to the  hospital. Summary This condition affects breathing during sleep. The most common cause is a collapsed or blocked airway. The goal of treatment is to help you breathe normally while you sleep. This information is not intended to replace advice given to you by your health care provider. Make sure you discuss any questions you have with your health care provider. Document Revised: 06/05/2021 Document Reviewed: 10/05/2020 Elsevier Patient Education  2022 [employer].

## 2021-09-24 NOTE — Progress Notes (Addendum)
Mary [name]    [id number]    07/10/1959  Primary Care Physician:Murray, Marvis Repress, FNP  Referring Physician: Marrian Salvage, [address] [place],  [place] [address]  Chief complaint:   History of mild obstructive sleep apnea Dysfunctional machine  HPI:  Patient diagnosed with mild obstructive sleep apnea in 2015 Has been using CPAP up until about 2 months ago when machine stopped working  Denies any shortness of breath No chest pains or chest discomfort  Admits to snoring Occasional dryness of her mouth in the mornings Occasional morning headaches  Usually goes to bed between 930 and 10 About 30 minutes to fall asleep Wakes up frequently about every hour Final wake up time about 5 AM  Will use of over the years  Denies any history of diabetes or hypertension  Never smoker  Does not recollect the parents snored   Outpatient Encounter Medications as of 09/24/2022  Medication Sig   gabapentin (NEURONTIN) 300 MG capsule TAKE 1 CAPSULE BY MOUTH THREE TIMES DAILY   pantoprazole (PROTONIX) 40 MG tablet Take 1 tablet (40 mg total) by mouth daily.   polyethylene glycol (MIRALAX / GLYCOLAX) packet Take 17 g by mouth daily. (Patient taking differently: Take 17 g by mouth as needed.)   [DISCONTINUED] potassium chloride SA (K-DUR,KLOR-CON) 20 MEQ tablet Take 20 mEq by mouth daily.   [DISCONTINUED] prochlorperazine (COMPAZINE) 10 MG tablet Take 10 mg by mouth every 6 (six) hours as needed. For nausea/vomiting.   Facility-Administered Encounter Medications as of 09/24/2022  Medication   cyanocobalamin ((VITAMIN B-12)) injection 1,000 mcg    Allergies as of 09/24/2022   (No Known Allergies)    Past Medical History:  Diagnosis Date   Adenocarcinoma sigmoid colon 05/12/11   Stage III(pT2pN2b) descending sigmoid   Arm DVT (deep venous thromboembolism), acute ([place]) 07/04/11   r/t PAC   Blood transfusion    Cancer of  descending colon ([place]) 05/12/2011   Diverticulitis    GERD (gastroesophageal reflux disease)    History of migraine headaches    Incisional hernia    iron deficiency anemia    Multiple hemangiomas    Liver   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 w/prolonged cold sensitivity--Oxaliplatin related   Pneumothorax 07/19/11   Bilateral   Renal cysts, acquired, bilateral    Sleep apnea    does not use CPAP on regular basis    Past Surgical History:  Procedure Laterality Date   ABDOMINAL HYSTERECTOMY  mid 20s   bleeding   COLON SURGERY  05/17/11   Partial colectomy   COLONOSCOPY  09/04/2014   One diminutive polyp at the recto sigmoid colon removed. Diverticulosis of the entire examined colon. Internal hemorrhoids.    HERNIA REPAIR  09/06/12   lap incisional hernia   INSERTION OF MESH  09/06/2012   Procedure: INSERTION OF MESH;  Surgeon: Gwenyth Ober, MD;  Location: [place];  Service: General;  Laterality: N/A;   ORIF ANKLE FRACTURE Left 12/27/2020   Procedure: OPEN REDUCTION INTERNAL FIXATION (ORIF) Left ankle trimalleolar fracture dislocation;  Surgeon: Wylene Simmer, MD;  Location: [place];  Service: Orthopedics;  Laterality: Left;  42min   PORT-A-CATH REMOVAL  05/06/2012   Procedure: MINOR REMOVAL PORT-A-CATH;  Surgeon: Gwenyth Ober, MD;  Location: [place];  Service: General;  Laterality: Right;   PORTACATH PLACEMENT  07/20/11   Right side placed  PORTACATH PLACEMENT     left side & right side   VENTRAL HERNIA REPAIR  09/06/2012   Procedure: LAPAROSCOPIC VENTRAL HERNIA;  Surgeon: Gwenyth Ober, MD;  Location: [place];  Service: General;  Laterality: N/A;    Family History  Problem Relation Age of Onset   Diabetes Mother    Hypertension Mother    Cancer Father        leukemia   Cancer Maternal Uncle        lung   Cancer Maternal Uncle        colon   Colitis Maternal Uncle    Cancer Maternal Uncle        prostate   Cancer Brother         PROSTATE   Other Brother        MVA   Esophageal cancer Neg Hx    Rectal cancer Neg Hx    Stomach cancer Neg Hx     Social History   Socioeconomic History   Marital status: Married    Spouse name: Not on file   Number of children: 3   Years of education: Not on file   Highest education level: Not on file  Occupational History   Occupation: Radio broadcast assistant  Tobacco Use   Smoking status: Never   Smokeless tobacco: Never  Vaping Use   Vaping Use: Never used  Substance and Sexual Activity   Alcohol use: No   Drug use: No   Sexual activity: Yes    Birth control/protection: Surgical    Comment: 1st intercourse 60 yo-Fewer than 5 partners  Other Topics Concern   Not on file  Social History Narrative   Lives with husband in a 1 story home.  Has 3 daughters and 7 grandkids and one on the way.     Works as a Merchandiser, retail.    Education: high school.   Social Determinants of Health   Financial Resource Strain: Not on file  Food Insecurity: Not on file  Transportation Needs: Not on file  Physical Activity: Not on file  Stress: Not on file  Social Connections: Not on file  Intimate Partner Violence: Not on file    Review of Systems  Constitutional:  Negative for fatigue.  Respiratory:  Positive for apnea. Negative for shortness of breath.   Psychiatric/Behavioral:  Positive for sleep disturbance.    Vitals:   09/24/21 1348  BP: 124/84  Pulse: 78  Temp: 98.3 F (36.8 C)  SpO2: 94%     Physical Exam Constitutional:      Appearance: She is obese.  HENT:     Head: Normocephalic.     Mouth/Throat:     Mouth: Mucous membranes are moist.     Comments: Mallampati 4, crowded oropharynx Cardiovascular:     Rate and Rhythm: Normal rate and regular rhythm.     Heart sounds: No murmur heard.   No friction rub.  Pulmonary:     Effort: No respiratory distress.  Musculoskeletal:     Cervical back: No rigidity or tenderness.  Neurological:     Mental Status: She is  alert.   Results of the Epworth flowsheet 09/24/2022  Sitting and reading 3  Watching TV 3  Sitting, inactive in a public place (e.g. a theatre or a meeting) 3  As a passenger in a car for an hour without a break 2  Lying down to rest in the afternoon when circumstances permit 3  Sitting and talking to someone  2  Sitting quietly after a lunch without alcohol 2  In a car, while stopped for a few minutes in traffic 3  Total score 21     Data Reviewed: Previous sleep study reviewed showing mild obstructive sleep apnea  Titration study reviewed showing titration to CPAP of 7  Assessment:  Mild obstructive sleep apnea  Excessive daytime sleepiness  Dysfunctional machine  Last study was many years ago, significant weight gain since then, excessive daytime sleepiness  Pathophysiology of sleep disordered breathing reviewed Treatment options reviewed  Patient continues to use CPAP on a nightly basis and continues to benefit from from CPAP use  Plan/Recommendations: Will schedule patient for home sleep study and continuation of treatment of her sleep apnea with CPAP  Encouraged weight loss efforts  Regular exercise encouraged  Optimize sleep hygiene as tolerated  Tentative follow-up in 3 to 4 months  Needs machine upgrade as she continues to benefit from CPAP use   Sherrilyn Rist MD [facility] 09/24/2022, 2:12 PM  CC: Marrian Salvage,*

## 2021-09-26 ENCOUNTER — Telehealth: Payer: Self-pay | Admitting: Family

## 2021-09-26 NOTE — Telephone Encounter (Signed)
Pt. Called and stated that [facility] surgery needs a letter of medical necessity sent to them regarding the weight loss surgery Mickel Baas qualified her for.  [facility]: Fax: [phone]

## 2021-09-27 ENCOUNTER — Other Ambulatory Visit: Payer: Self-pay | Admitting: Family

## 2021-09-27 NOTE — Telephone Encounter (Signed)
Noted  

## 2021-09-27 NOTE — Telephone Encounter (Signed)
Pt was seen 08/23/21. I see that we referred pt to weight loss surgeon. Pt is asking for letter. Is this appropriate?

## 2021-10-15 ENCOUNTER — Ambulatory Visit (INDEPENDENT_AMBULATORY_CARE_PROVIDER_SITE_OTHER): Payer: 59

## 2021-10-15 DIAGNOSIS — E538 Deficiency of other specified B group vitamins: Secondary | ICD-10-CM

## 2021-10-15 MED ORDER — CYANOCOBALAMIN 1000 MCG/ML IJ SOLN
1000.0000 ug | Freq: Once | INTRAMUSCULAR | Status: AC
  Administered 2021-10-15: 1000 ug via INTRAMUSCULAR

## 2021-10-15 NOTE — Progress Notes (Signed)
Mary [name] is a 60 y.o. female presents to the office today for Monthly B12 injectio per physician's orders. Original order: on ov note from 08-23-21 :  B12 given today/ repeat in 2 weeks; will most likely need to continue at least every 1-2 months for maintenance.  B12 1000 mcg IM was administered L deltoid (location) today. Patient tolerated injection. Patient due for follow up labs/provider appt: No.  Patient next injection due: 1 month, appt made Yes  Creft, Kristine Garbe L

## 2021-10-28 ENCOUNTER — Telehealth: Payer: Self-pay | Admitting: Pulmonary Disease

## 2021-10-28 NOTE — Telephone Encounter (Signed)
I received a fax from patient medical equipment company and I faxed the paperwork. I will keep in the folder to make sure everything went through for this patient.

## 2021-11-14 ENCOUNTER — Ambulatory Visit (INDEPENDENT_AMBULATORY_CARE_PROVIDER_SITE_OTHER): Payer: 59

## 2021-11-14 DIAGNOSIS — E538 Deficiency of other specified B group vitamins: Secondary | ICD-10-CM | POA: Diagnosis not present

## 2021-11-14 MED ORDER — CYANOCOBALAMIN 1000 MCG/ML IJ SOLN
1000.0000 ug | Freq: Once | INTRAMUSCULAR | Status: AC
  Administered 2021-11-14: 1000 ug via INTRAMUSCULAR

## 2021-11-14 NOTE — Progress Notes (Signed)
Mary [name] is a 61 y.o. female presents to the office today for Monthly B12 injectio per physician's orders. Original order: on ov note from 08-23-21 :  B12 given today/ repeat in 2 weeks; will most likely need to continue at least every 1-2 months for maintenance.  B12 1000 mcg IM was administered right deltoid (location) today.  Patient tolerated injection. Patient due for follow up labs/provider appt: No.  Patient next injection due: 1 month, appt made for 12-17-21.

## 2021-11-18 NOTE — Telephone Encounter (Signed)
I am re-faxing the updated note.

## 2021-11-18 NOTE — Telephone Encounter (Signed)
I called and spoke with the medical supply company and they need the 09/2021 note to be add ended  to say that the patient is still using her CPAP. Please advise.

## 2021-11-18 NOTE — Telephone Encounter (Signed)
addeded

## 2021-12-12 ENCOUNTER — Ambulatory Visit: Payer: 59 | Admitting: Family

## 2021-12-12 VITALS — BP 150/90 | HR 76 | Temp 98.4°F | Ht 65.0 in | Wt 261.0 lb

## 2021-12-12 DIAGNOSIS — R7309 Other abnormal glucose: Secondary | ICD-10-CM

## 2021-12-12 DIAGNOSIS — E538 Deficiency of other specified B group vitamins: Secondary | ICD-10-CM | POA: Diagnosis not present

## 2021-12-12 DIAGNOSIS — F418 Other specified anxiety disorders: Secondary | ICD-10-CM

## 2021-12-12 LAB — VITAMIN B12: Vitamin B-12: 659 pg/mL (ref 211–911)

## 2021-12-12 LAB — CBC WITH DIFFERENTIAL/PLATELET
Basophils Absolute: 0 10*3/uL (ref 0.0–0.1)
Basophils Relative: 0.7 % (ref 0.0–3.0)
Eosinophils Absolute: 0.1 10*3/uL (ref 0.0–0.7)
Eosinophils Relative: 2 % (ref 0.0–5.0)
HCT: 39.2 % (ref 36.0–46.0)
Hemoglobin: 11.9 g/dL — ABNORMAL LOW (ref 12.0–15.0)
Lymphocytes Relative: 32 % (ref 12.0–46.0)
Lymphs Abs: 1.7 10*3/uL (ref 0.7–4.0)
MCHC: 30.4 g/dL (ref 30.0–36.0)
MCV: 70.9 fl — ABNORMAL LOW (ref 78.0–100.0)
Monocytes Absolute: 0.4 10*3/uL (ref 0.1–1.0)
Monocytes Relative: 7.5 % (ref 3.0–12.0)
Neutro Abs: 3.1 10*3/uL (ref 1.4–7.7)
Neutrophils Relative %: 57.8 % (ref 43.0–77.0)
Platelets: 207 10*3/uL (ref 150.0–400.0)
RBC: 5.53 Mil/uL — ABNORMAL HIGH (ref 3.87–5.11)
RDW: 16.6 % — ABNORMAL HIGH (ref 11.5–15.5)
WBC: 5.4 10*3/uL (ref 4.0–10.5)

## 2021-12-12 LAB — COMPREHENSIVE METABOLIC PANEL
ALT: 13 U/L (ref 0–35)
AST: 14 U/L (ref 0–37)
Albumin: 3.7 g/dL (ref 3.5–5.2)
Alkaline Phosphatase: 94 U/L (ref 39–117)
BUN: 13 mg/dL (ref 6–23)
CO2: 32 mEq/L (ref 19–32)
Calcium: 9.4 mg/dL (ref 8.4–10.5)
Chloride: 106 mEq/L (ref 96–112)
Creatinine, Ser: 1.35 mg/dL — ABNORMAL HIGH (ref 0.40–1.20)
GFR: 42.73 mL/min — ABNORMAL LOW (ref >60.00)
Glucose, Bld: 95 mg/dL (ref 70–99)
Potassium: 4.1 mEq/L (ref 3.5–5.1)
Sodium: 142 mEq/L (ref 135–145)
Total Bilirubin: 0.5 mg/dL (ref 0.2–1.2)
Total Protein: 6.9 g/dL (ref 6.0–8.3)

## 2021-12-12 LAB — HEMOGLOBIN A1C: Hgb A1c MFr Bld: 7.2 % — ABNORMAL HIGH (ref 4.6–6.5)

## 2021-12-12 MED ORDER — DULOXETINE HCL 30 MG PO CPEP: 30.0000 mg | ORAL_CAPSULE | Freq: Every day | ORAL | 0 refills | Status: DC

## 2021-12-12 NOTE — Progress Notes (Signed)
Mary [name] is a 61 y.o. female with the following history as recorded in EpicCare:  Patient Active Problem List   Diagnosis Date Noted   Left trimalleolar fracture, closed, initial encounter 12/18/2020   Left ankle pain 12/18/2020   Labial cyst 03/05/2018   Chest pain 05/30/2017   Chronic nonintractable headache 05/04/2017   Ankle edema, bilateral 05/04/2017   Cellulitis 04/04/2016   Abnormal x-ray 04/04/2016   Hearing loss due to cerumen impaction 01/14/2016   Impaired fasting blood sugar 01/14/2016   Plantar fasciitis, right 11/22/2015   Right calcaneal fracture 08/15/2015   OSA (obstructive sleep apnea) 09/26/2014   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 90 capsule 0   gabapentin (NEURONTIN) 300 MG capsule TAKE 1 CAPSULE BY MOUTH THREE TIMES DAILY 90 capsule 0   pantoprazole (PROTONIX) 40 MG tablet Take 1 tablet (40 mg total) by mouth daily. 30 tablet 11   No current facility-administered medications for this visit.    Allergies: Patient has no known allergies.  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 w/prolonged cold sensitivity--Oxaliplatin related   Pneumothorax 07/19/11   Bilateral   Renal cysts, acquired, bilateral    Sleep apnea    does not use CPAP on regular basis    Past Surgical History:  Procedure Laterality Date   ABDOMINAL HYSTERECTOMY  mid 20s   bleeding   COLON SURGERY  05/17/11   Partial colectomy   COLONOSCOPY  09/04/2014   One diminutive polyp at the recto sigmoid colon removed. Diverticulosis of the entire examined colon. Internal hemorrhoids.    HERNIA REPAIR  09/06/12   lap incisional hernia   INSERTION OF MESH  09/06/2012   Procedure: INSERTION OF MESH;  Surgeon: Gwenyth Ober, MD;  Location: [place];  Service: General;  Laterality: N/A;   ORIF ANKLE FRACTURE Left 12/27/2020   Procedure: OPEN REDUCTION INTERNAL FIXATION (ORIF) Left ankle trimalleolar fracture dislocation;  Surgeon: Wylene Simmer, MD;  Location: [place];  Service: Orthopedics;  Laterality: Left;  51mn   PORT-A-CATH REMOVAL  05/06/2012   Procedure: MINOR REMOVAL PORT-A-CATH;  Surgeon: JGwenyth Ober MD;  Location: [facility]  Service: General;  Laterality: Right;   PORTACATH PLACEMENT  07/20/11   Right side placed   PORTACATH PLACEMENT     left side & right side   VENTRAL HERNIA REPAIR  09/06/2012   Procedure: LAPAROSCOPIC VENTRAL HERNIA;  Surgeon: JGwenyth Ober MD;  Location[place] [facility]  Service: General;  Laterality: N/A;    Family History  Problem Relation Age of  Onset   Diabetes Mother    Hypertension Mother    Cancer Father        leukemia   Cancer Maternal Uncle        lung   Cancer Maternal Uncle        colon   Colitis Maternal Uncle    Cancer Maternal Uncle        prostate   Cancer Brother        PROSTATE   Other Brother        MVA   Esophageal cancer Neg Hx    Rectal cancer Neg Hx    Stomach cancer Neg Hx     Social History   Tobacco Use   Smoking status: Never   Smokeless tobacco: Never  Substance Use Topics   Alcohol use: No     Subjective:  Has been having increased problems with anxiety secondary to complications from ankle injury that occurred in 12/2020; feeling "overwhelmed"; would like to discuss trial of medication; specifically asks about trial of Xanax;  Also would like to her B12 shot updated today;      Objective:  Vitals:   12/12/21 1120  BP: (!) 150/90  Pulse: 76  Temp: 98.4 F (36.9 C)  TempSrc: Oral  SpO2: 97%  Weight: 261 lb (118.4 kg)  Height: _0  (1.651 m)    General: Well developed, well nourished, in no acute distress  Skin : Warm and dry.  Head: Normocephalic and atraumatic  Lungs: Respirations unlabored;  Neurologic: Alert and oriented; speech intact; face symmetrical; moves all extremities well; CNII-XII intact without focal deficit; left foot in walking boot  Assessment:  1. Low vitamin B12 level   2. Elevated glucose   3. Situational anxiety     Plan:  Update B12 level today; she did get B12 injection today; may be able to d/c injection and have her only do oral medication; Update Hgba1c today; Trial of Cymbalta- explained this can help with anxiety as well as beneficial due to her chronic pain issues; will start with 30 mg and consider increasing to 60 mg; explained that regular use of Xanax can be habit forming and we try to limit use of this drug if possible;   This visit occurred during the SARS-CoV-2 public health emergency.  Safety protocols were in place, including screening questions prior to the visit, additional usage of staff PPE, and extensive cleaning of exam room while observing appropriate contact time as indicated for disinfecting solutions.    No follow-ups on file.  Orders Placed This Encounter  Procedures   CBC with Differential/Platelet   Comp Met (CMET)   Hemoglobin A1c   B12    Requested Prescriptions   Signed Prescriptions Disp Refills   DULoxetine (CYMBALTA) 30 MG capsule 90 capsule 0    Sig: Take 1 capsule (30 mg total) by mouth daily.

## 2021-12-13 ENCOUNTER — Other Ambulatory Visit (HOSPITAL_COMMUNITY): Payer: Self-pay | Admitting: Surgery

## 2021-12-17 ENCOUNTER — Ambulatory Visit: Payer: 59

## 2021-12-17 MED ORDER — DULOXETINE HCL 60 MG PO CPEP: 60.0000 mg | ORAL_CAPSULE | Freq: Every day | ORAL | 1 refills | Status: DC

## 2021-12-17 NOTE — Addendum Note (Signed)
Addended by: Kittie Plater, Gauge Winski HUA on: 12/17/2021 04:57 PM   Modules accepted: Orders

## 2021-12-17 NOTE — Progress Notes (Deleted)
Mary [name] is a 61 y.o. female presents to the office today for Monthly B12 injectio per physician's orders.  Original order: on ov note from 08-23-21:  B12 given today/ repeat in 2 weeks; will most likely need to continue at least every 1-2 months for maintenance.   B12 1000 mcg IM was administered in right deltoid today.  Patient tolerated injection.  Patient next injection due: 1 month, appt made for 01/14/22.

## 2021-12-25 ENCOUNTER — Ambulatory Visit: Payer: 59

## 2021-12-25 ENCOUNTER — Other Ambulatory Visit: Payer: Self-pay

## 2021-12-25 DIAGNOSIS — G4733 Obstructive sleep apnea (adult) (pediatric): Secondary | ICD-10-CM | POA: Diagnosis not present

## 2021-12-25 DIAGNOSIS — G4719 Other hypersomnia: Secondary | ICD-10-CM

## 2021-12-26 ENCOUNTER — Other Ambulatory Visit: Payer: Self-pay

## 2021-12-26 ENCOUNTER — Ambulatory Visit (HOSPITAL_COMMUNITY)
Admission: RE | Admit: 2021-12-26 | Discharge: 2021-12-26 | Disposition: A | Discharge status: Home / Self Care | Payer: 59 | Source: Ambulatory Visit | Attending: Surgery | Admitting: Surgery

## 2022-01-02 ENCOUNTER — Encounter: Payer: Self-pay | Admitting: Skilled Nursing Facility1

## 2022-01-02 ENCOUNTER — Encounter: Payer: 59 | Attending: Surgery | Admitting: Skilled Nursing Facility1

## 2022-01-02 ENCOUNTER — Other Ambulatory Visit: Payer: Self-pay

## 2022-01-02 DIAGNOSIS — E669 Obesity, unspecified: Secondary | ICD-10-CM

## 2022-01-02 DIAGNOSIS — Z713 Dietary counseling and surveillance: Secondary | ICD-10-CM | POA: Diagnosis not present

## 2022-01-02 DIAGNOSIS — Z6835 Body mass index (BMI) 35.0-35.9, adult: Secondary | ICD-10-CM | POA: Diagnosis not present

## 2022-01-02 NOTE — Progress Notes (Signed)
Nutrition Assessment for Bariatric Surgery Medical Nutrition Therapy Appt Start Time: 1:50    End Time: 3:00  Patient was seen on 01/02/2022 for Pre-Operative Nutrition Assessment. Letter of approval faxed to [facility] bariatric surgery program coordinator on 01/02/2022.   Referral stated Supervised Weight Loss (SWL) visits needed: 6  Planned surgery: Gastric Band Pt expectation of surgery: to be healthy Pt expectation of dietitian: to help educate    NUTRITION ASSESSMENT   Anthropometrics  Start weight at [facility]: 260.7 lbs (date: 01/02/2022)  Height: 71 in BMI: 36.36 kg/m2     Clinical  Medical hx: sleep apnea, GERD, cancer of descending colon (some removed), anemia, anxiety, CKD, DM Medications: taking B12 monthly  Labs: A1C 7.2 (has had an A1C over 6.5 for 7 years), creatinine 1.35, GFR 42.73, RBC 5.53, hemoglobin 11.9, MCV 70.9, RDW 16.6 Notable signs/symptoms: migraines, constipation Any previous deficiencies? Yes: iron and b12  Micronutrient Nutrition Focused Physical Exam: Hair: No issues observed Eyes: No issues observed Mouth: No issues observed Neck: No issues observed Nails: No issues observed Skin: No issues observed  Lifestyle & Dietary Hx  Pt arrives with a boot due to recently having screws removed form a previous injury.  Pt states she does not wear her C-PAP every night.   Pt states she has 1-2 bowel movements a week.  Pt stets her appetite has been lessened. Pt states she noticed her creatinine was high in her labs and is very concerned with this.  Pt states she typically does skip meals throughout the week stating her meals really are inconsistent.  Pt states she tries to eat her last meal before 7pm.  Pt states most of her meals are eaten out.  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 Pt states she does feel shame or guilt after eating sometimes about once a week.  Pt states when she is depressed she eats especially  when she lost her mother.  Pt states she will be starting back to work soon.   24-Hr Dietary Recall First Meal: hamburger and fries or skipped Snack:  Second Meal: fruit or chips or skipped Snack:  Third Meal: peanut butter jelly and ramen Snack: fruit or cereal  Beverages: water, wine, grape juice, apple juice, coffee   Estimated Energy Needs Calories: 1600   NUTRITION DIAGNOSIS  Overweight/obesity ([place]-3.3) related to past poor dietary habits and physical inactivity as evidenced by patient w/ planned gastric band surgery following dietary guidelines for continued weight loss.    NUTRITION INTERVENTION  Nutrition counseling (C-1) and education (E-2) to facilitate bariatric surgery goals.  Educated pt on micronutrient deficiencies post surgery and strategies to mitigate that risk Educated pt on the nutrition facts label    Pre-Op Goals Reviewed with the Patient Track food and beverage intake (pen and paper, MyFitness Pal, Baritastic app, etc.) Make healthy food choices while monitoring portion sizes Consume 3 meals per day or try to eat every 3-5 hours Avoid concentrated sugars and fried foods Keep sugar & fat in the single digits per serving on food labels Practice CHEWING your food (aim for applesauce consistency) Practice not drinking 15 minutes before, during, and 30 minutes after each meal and snack Avoid all carbonated beverages (ex: soda, sparkling beverages)  Limit caffeinated beverages (ex: coffee, tea, energy drinks) Avoid all sugar-sweetened beverages (ex: regular soda, sports drinks): stop drinking juice Avoid alcohol  Aim for 64-100 ounces of FLUID daily (with at least half of fluid intake  being plain water)  Aim for at least 60-80 grams of PROTEIN daily Look for a liquid protein source that contains ?15 g protein and ?5 g carbohydrate (ex: shakes, drinks, shots) Make a list of non-food related activities Physical activity is an important part of a healthy  lifestyle so keep it moving! The goal is to reach 150 minutes of exercise per week, including cardiovascular and weight baring activity. Dedicate time to stress relief: drive the cart around [employer] or sit on a bench at the park Read the nutrition facts label  Avoid eating out so often  *Goals that are bolded indicate the pt would like to start working towards these  Handouts Provided Include  Bariatric Surgery handouts (Nutrition Visits, Pre-Op Goals, Protein Shakes, Vitamins & Minerals)  Learning Style & Readiness for Change Teaching method utilized: Visual & Auditory  Demonstrated degree of understanding via: Teach Back  Readiness Level: action Barriers to learning/adherence to lifestyle change: none identifed   RD's Notes for Next Visit Assess pts adherence to chosen goals      MONITORING & EVALUATION Dietary intake, weekly physical activity, body weight, and pre-op goals reached at next nutrition visit.    Next Steps  Patient is to follow up at [facility] for Pre-Op Class >2 weeks before surgery for further nutrition education.

## 2022-01-03 DIAGNOSIS — G4733 Obstructive sleep apnea (adult) (pediatric): Secondary | ICD-10-CM

## 2022-01-09 ENCOUNTER — Ambulatory Visit: Payer: 59

## 2022-01-09 DIAGNOSIS — E538 Deficiency of other specified B group vitamins: Secondary | ICD-10-CM

## 2022-01-09 MED ORDER — CYANOCOBALAMIN 1000 MCG/ML IJ SOLN: 1000.0000 ug | Freq: Once | INTRAMUSCULAR | Status: AC

## 2022-01-09 NOTE — Progress Notes (Signed)
Mary [name] is a 61 y.o. female presents to the office today for B12:  injections, per physician's orders. ?Original order: on lab results from 12-12-21 to continue B12 injections monthly. ?cyanocobalamin (med), 1000 mg/ml (dose),  im (route) was administered left deltoid (location) today. Patient tolerated injection. ? ?Patient next injection due: in one month, appt made Yes 02-11-22. ? ?Jiles Prows ?

## 2022-01-19 ENCOUNTER — Other Ambulatory Visit: Payer: Self-pay | Admitting: Gastroenterology

## 2022-01-21 ENCOUNTER — Other Ambulatory Visit: Payer: Self-pay

## 2022-01-21 ENCOUNTER — Encounter: Payer: 59 | Attending: Surgery | Admitting: Skilled Nursing Facility1

## 2022-01-21 DIAGNOSIS — E669 Obesity, unspecified: Secondary | ICD-10-CM | POA: Insufficient documentation

## 2022-01-21 DIAGNOSIS — E119 Type 2 diabetes mellitus without complications: Secondary | ICD-10-CM | POA: Diagnosis present

## 2022-01-21 DIAGNOSIS — D631 Anemia in chronic kidney disease: Secondary | ICD-10-CM | POA: Diagnosis present

## 2022-01-21 DIAGNOSIS — N189 Chronic kidney disease, unspecified: Secondary | ICD-10-CM | POA: Insufficient documentation

## 2022-01-21 NOTE — Progress Notes (Signed)
Supervised Weight Loss Visit ?Bariatric Nutrition Education ? ?Planned Surgery: sleeve gastrectomy   ? ?1 out of 6 [facility] Appointments  ? ?NUTRITION ASSESSMENT ? ?Anthropometrics  ?Start weight at [facility]: 260.7 lbs (date: 01/21/2022) ?Today's weight: 258.6 lbs ?BMI: 36.11 kg/m2  (pt is aware of her BMI and insurance coverage)  ? ?Clinical  ?Medical hx: sleep apnea, GERD, cancer of descending colon (some removed), anemia, anxiety, CKD, DM ?Medications: taking B12 monthly  ?Labs: A1C 7.2 (has had an A1C over 6.5 for 7 years), creatinine 1.35, GFR 42.73, RBC 5.53, hemoglobin 11.9, MCV 70.9, RDW 16.6 ?Notable signs/symptoms: migraines, constipation ?Any previous deficiencies? Yes: iron and b12 ? ?Lifestyle & Dietary Hx ? ?Pt states she has severely cut back on fried foods and no longer eats ramen noodles which is hard because she loves ramen noodles. Pt states she has been watching her salt intake and cut back on eating out and when she does eat out she eats more non starchy vegetables. Pt states since making these changes she has not felt as sluggish. Pt states she has been pushing herself to eat 2 mels a day rather than 1 per day. Pt states she is also pooping more often throughout the week.  ?Pt states she tried asparagus but does not like it. Pt states she cut out bread almost completely.  ?Pt states having diabetes really scared her and motivated her to make changes.  ?Pt states she does not check her blood sugar/  ? ?Estimated daily fluid intake:  oz ?Supplements: N/A ?Current average weekly physical activity: starts PT for her foot tomorrow  ? ?24-Hr Dietary Recall ?First Meal: splenda protein shake + protein bar ?Snack: cucumbers and tomatoes  ?Second Meal: chicken  + green beans + corn ?Snack: fruit ?Third Meal: baked chicken wings + corn + pinto beans ?Snack:  ?Beverages: water ? ?Estimated Energy Needs ?Calories: 1600 ? ? ?NUTRITION DIAGNOSIS  ?Overweight/obesity ([place]-3.3) related to past poor dietary habits and  physical inactivity as evidenced by patient w/ planned sleeve gastrectomy surgery following dietary guidelines for continued weight loss. ? ? ?NUTRITION INTERVENTION  ?Nutrition counseling (C-1) and education (E-2) to facilitate bariatric surgery goals. ? ?Pre-Op Goals Progress & New Goals ?Continue: Avoid all sugar-sweetened beverages (ex: regular soda, sports drinks): stop drinking juice ?Continue: Dedicate time to stress relief: drive the cart around [employer] or sit on a bench at the park ?Continue: Read the nutrition facts label  ?Continue: Avoid eating out so often ?NEW: do the exercises PT tells you to do, try the bike 1-2 times a week if okayd by PT ?NEW: try some plant based proteins throughout the week ? ?Handouts Provided Include  ?N/A ? ?Learning Style & Readiness for Change ?Teaching method utilized: Visual & Auditory  ?Demonstrated degree of understanding via: Teach Back  ?Readiness Level: action ?Barriers to learning/adherence to lifestyle change: none identified  ? ?RD's Notes for next Visit  ?Assess pts adherence to chosen goals ? ? ?MONITORING & EVALUATION ?Dietary intake, weekly physical activity, body weight, and pre-op goals in 1 month.  ? ?Next Steps  ?Patient is to return to [facility]  1 month ?

## 2022-02-11 ENCOUNTER — Ambulatory Visit: Payer: 59 | Admitting: Family

## 2022-02-11 ENCOUNTER — Ambulatory Visit (INDEPENDENT_AMBULATORY_CARE_PROVIDER_SITE_OTHER): Payer: 59 | Admitting: *Deleted

## 2022-02-11 ENCOUNTER — Ambulatory Visit: Payer: 59

## 2022-02-11 DIAGNOSIS — E538 Deficiency of other specified B group vitamins: Secondary | ICD-10-CM | POA: Diagnosis not present

## 2022-02-11 MED ORDER — CYANOCOBALAMIN 1000 MCG/ML IJ SOLN
1000.0000 ug | Freq: Once | INTRAMUSCULAR | Status: AC
  Administered 2022-02-11: 1000 ug via INTRAMUSCULAR

## 2022-02-11 NOTE — Progress Notes (Signed)
Patient here for monthly b12 injection per PCP. ? ?Injection given in right deltoid and patient tolerated well. ? ? ?

## 2022-02-20 ENCOUNTER — Encounter: Payer: 59 | Attending: Surgery | Admitting: Skilled Nursing Facility1

## 2022-02-20 ENCOUNTER — Ambulatory Visit: Payer: 59 | Admitting: Skilled Nursing Facility1

## 2022-02-20 DIAGNOSIS — Z6836 Body mass index (BMI) 36.0-36.9, adult: Secondary | ICD-10-CM | POA: Insufficient documentation

## 2022-02-20 DIAGNOSIS — Z713 Dietary counseling and surveillance: Secondary | ICD-10-CM | POA: Diagnosis not present

## 2022-02-20 DIAGNOSIS — E669 Obesity, unspecified: Secondary | ICD-10-CM

## 2022-02-20 NOTE — Progress Notes (Signed)
Supervised Weight Loss Visit ?Bariatric Nutrition Education ? ?Planned Surgery: sleeve gastrectomy   ? ?2 out of 6 [facility] Appointments  ? ?NUTRITION ASSESSMENT ? ?Anthropometrics  ?Start weight at [facility]: 260.7 lbs (date: 01/21/2022) ?Today's weight: 263.7 lbs ?BMI: 36.81 kg/m2  (pt is aware of her BMI and insurance coverage)  ? ?Clinical  ?Medical hx: sleep apnea, GERD, cancer of descending colon (some removed), anemia, anxiety, CKD, DM ?Medications: taking B12 monthly  ?Labs: A1C 7.2 (has had an A1C over 6.5 for 7 years), creatinine 1.35, GFR 42.73, RBC 5.53, hemoglobin 11.9, MCV 70.9, RDW 16.6 ?Notable signs/symptoms: migraines, constipation ?Any previous deficiencies? Yes: iron and b12 ? ?Lifestyle & Dietary Hx ? ?Pt states her grandson with sickle cell is in the hospital so she has been stressed so she has been stress eating. Pt states she needs a hobby to not stress eat and not board eat since she is currently not working. Dietitian provided a handout with "Things to Do" ? ?Pt states her and her husbands anniversary is tomorrow so they are celebrating in [employer].  ?Pt states PT is going well.  States she used the elliptical for her session. ?Pt state she likes the veggie burgers so has those as an option.  ?Pt state she only cooks her meats in the microwave or air fryer.  ?Pt states she has not been checking her blood sugars.  Pt states she thinks it is okay and she hasn't been tired. ? ?Estimated daily fluid intake: 64+ oz ?Supplements: N/A ?Current average weekly physical activity: started Pt for her ankle and PT exercises at home  ? ?24-Hr Dietary Recall ?First Meal: splenda protein shake + protein bar or eaten out ?Snack: cucumbers and tomatoes  ?Second Meal: chicken  + green beans + corn or Diabetic shake (patient doesn't remember the brand) ?Snack: fruit ?Third Meal: boiled eggs and bacon ?Snack:  ?Beverages: water ? ?Estimated Energy Needs ?Calories: 1600 ? ? ?NUTRITION DIAGNOSIS  ?Overweight/obesity  ([place]-3.3) related to past poor dietary habits and physical inactivity as evidenced by patient w/ planned sleeve gastrectomy surgery following dietary guidelines for continued weight loss. ? ? ?NUTRITION INTERVENTION  ?Nutrition counseling (C-1) and education (E-2) to facilitate bariatric surgery goals. ? ?Pre-Op Goals Progress & New Goals ?Continue: Avoid all sugar-sweetened beverages (ex: regular soda, sports drinks): stop drinking juice ?Continue: Dedicate time to stress relief: drive the cart around [employer] or sit on a bench at the park ?Continue: Read the nutrition facts label  ?Continue: Avoid eating out so often ?continue: do the exercises PT tells you to do, try the bike 1-2 times a week if okayd by PT ?continue: try some plant based proteins throughout the week ?NEW: find and start a hobby ?NEW: drink on water throughout the day instead of chugging it ? ?Handouts Provided Include  ?Handout of "Things to Do" to find a hobby. ? ?Learning Style & Readiness for Change ?Teaching method utilized: Visual & Auditory  ?Demonstrated degree of understanding via: Teach Back  ?Readiness Level: action ?Barriers to learning/adherence to lifestyle change: none identified  ? ?RD's Notes for next Visit  ?Assess pts adherence to chosen goals ? ? ?MONITORING & EVALUATION ?Dietary intake, weekly physical activity, body weight, and pre-op goals in 1 month.  ? ?Next Steps  ?Patient is to return to [facility]  1 month ?

## 2022-03-13 ENCOUNTER — Ambulatory Visit (INDEPENDENT_AMBULATORY_CARE_PROVIDER_SITE_OTHER): Payer: 59

## 2022-03-13 DIAGNOSIS — E538 Deficiency of other specified B group vitamins: Secondary | ICD-10-CM

## 2022-03-13 MED ORDER — CYANOCOBALAMIN 1000 MCG/ML IJ SOLN
1000.0000 ug | Freq: Once | INTRAMUSCULAR | Status: AC
  Administered 2022-03-13: 1000 ug via INTRAMUSCULAR

## 2022-03-13 NOTE — Progress Notes (Deleted)
Patient here for monthly b12 injection per PCP. ?  ?Injection given in right deltoid and patient tolerated well. ?  ?

## 2022-03-13 NOTE — Progress Notes (Signed)
EGYPT WELCOME is a 61 y.o. female presents to the office today for B12:  injections, per physician's orders. ?Original order:per Jodi Mourning NP, on lab results from 12-16-21 to continue monthly B12. ? ?cyanocobalamin (med), 1000 mg/ 77m (dose),  IM (route) was administered right deltoid  (location) today. Patient tolerated injection. ? ?Patient next injection due: in one month, appt made Yes  04-17-2022. ? ?RJiles Prows? ?

## 2022-03-16 IMAGING — DX DG KNEE 1-2V PORT*L*
4 series · 4 of 4 positions shown · non-contrast
Comparison: None.

CLINICAL DATA: 59-year-old female status post fall with pain.

EXAM:
PORTABLE LEFT KNEE - 1-2 VIEW

[knee ap]
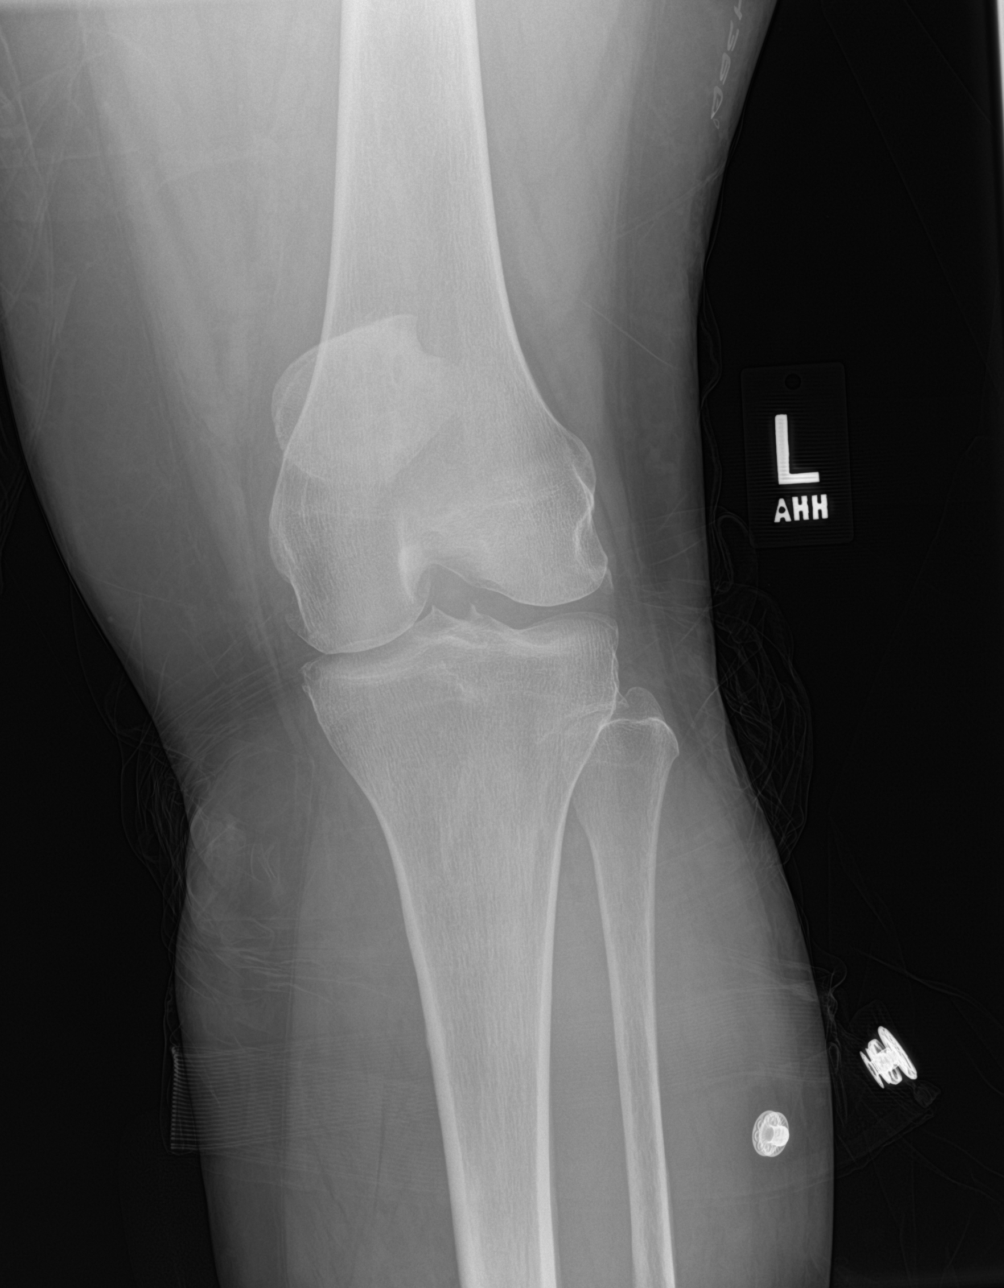

[knee obl (1 of 2)]
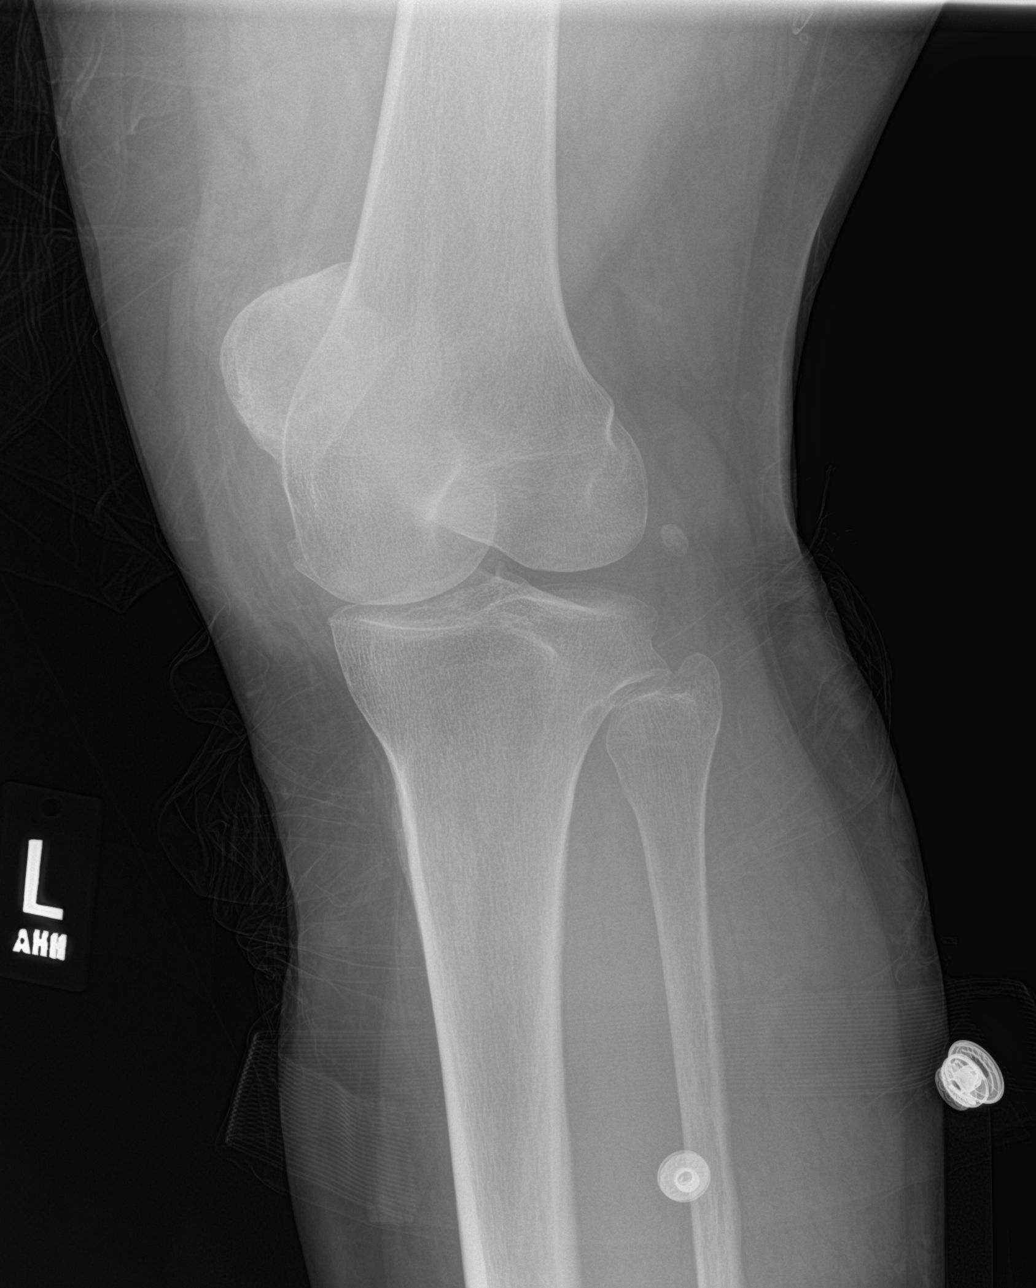

[knee obl (2 of 2)]
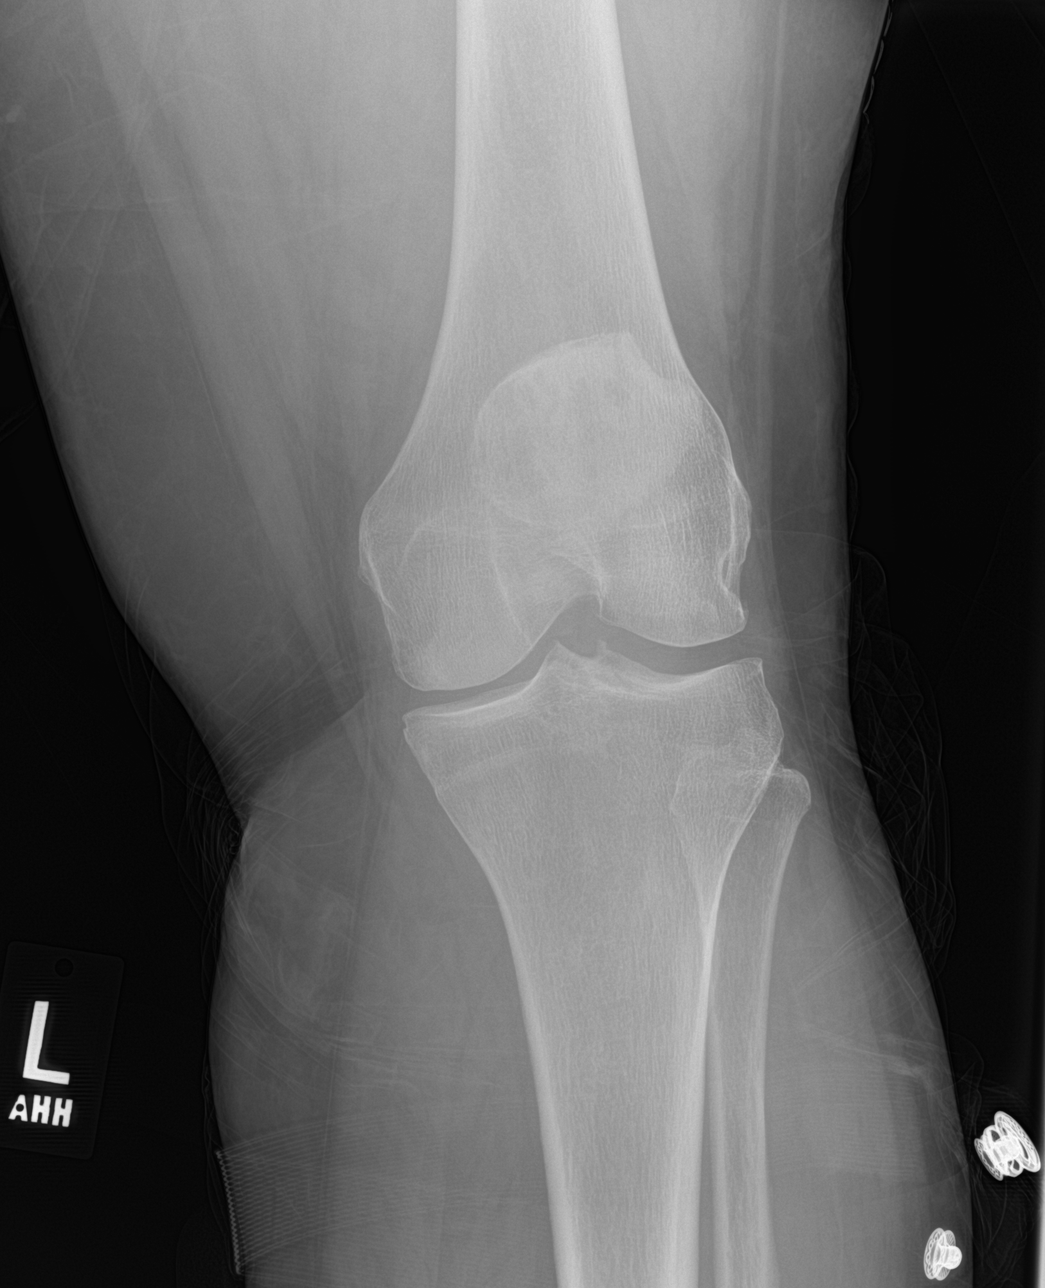

[knee lat]
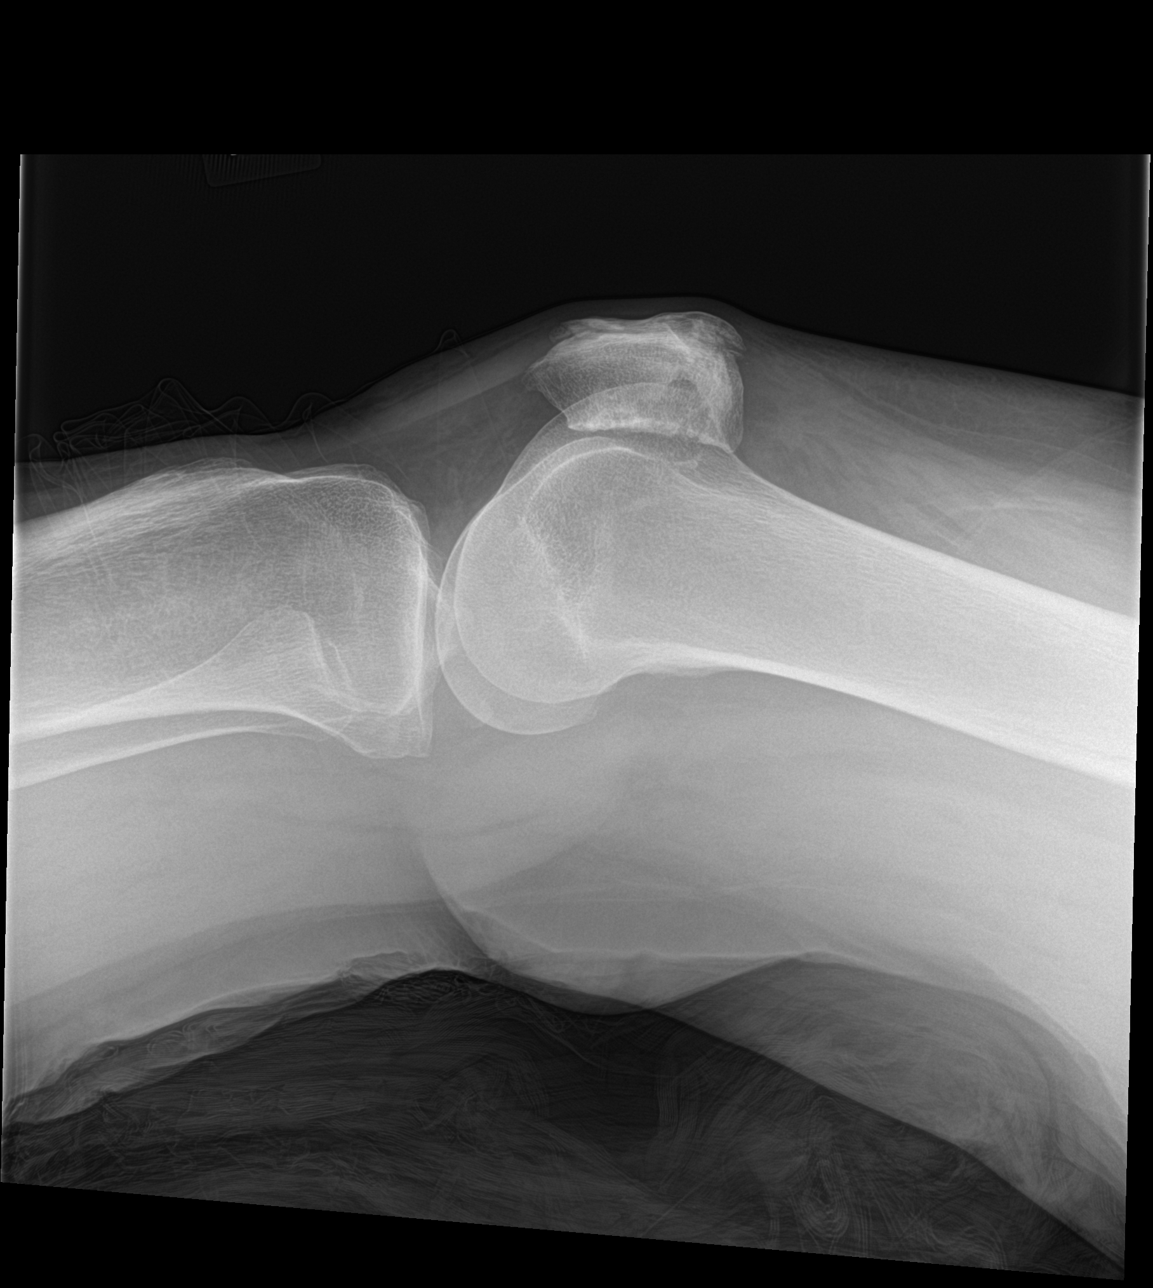

[4 of 4 positions shown; findings below may reference images not displayed]

FINDINGS: No evidence of joint effusion on cross-table lateral view. Preserved
left knee joint spaces and alignment. Mild mostly medial compartment
degenerative spurring. Additional subchondral sclerosis and lucency
along the undersurface of the patella. No acute osseous abnormality
identified. No discrete soft tissue injury.
IMPRESSION: 1. No acute fracture or dislocation identified about the left knee.
2. Osteochondral degeneration of the patella.

## 2022-03-16 IMAGING — DX DG ANKLE PORT 2V*L*
3 series · 3 of 3 positions shown · non-contrast
Comparison: 05/09/2014.

CLINICAL DATA: 59-year-old female status post fall with ankle
deformity.

EXAM:
PORTABLE LEFT ANKLE - 2 VIEW

[ankle ap]
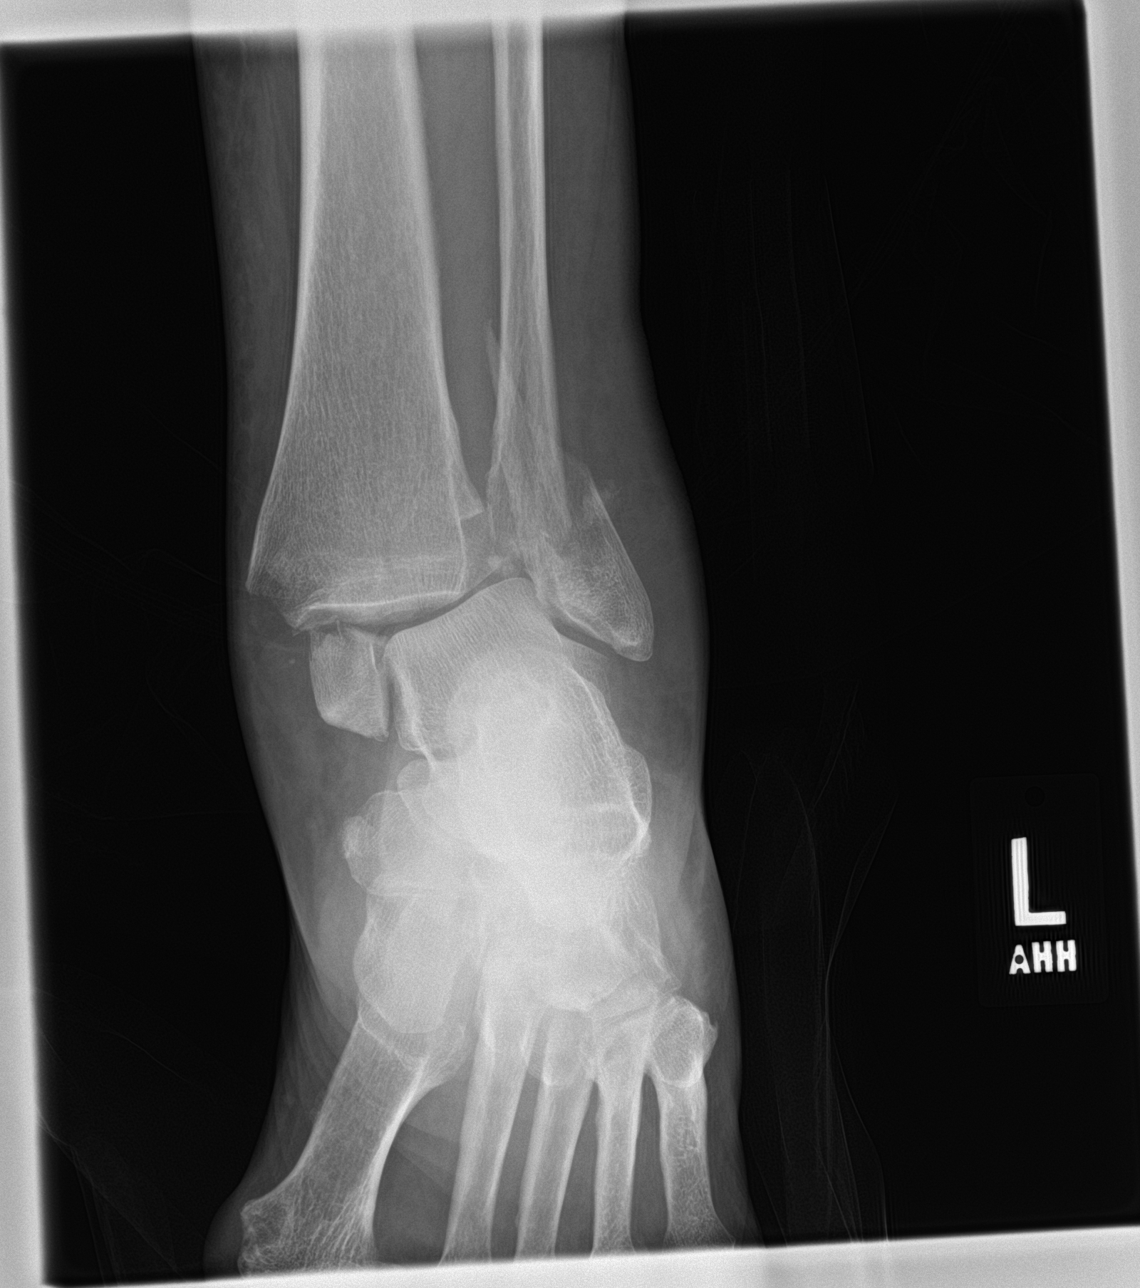

[ankle obl]
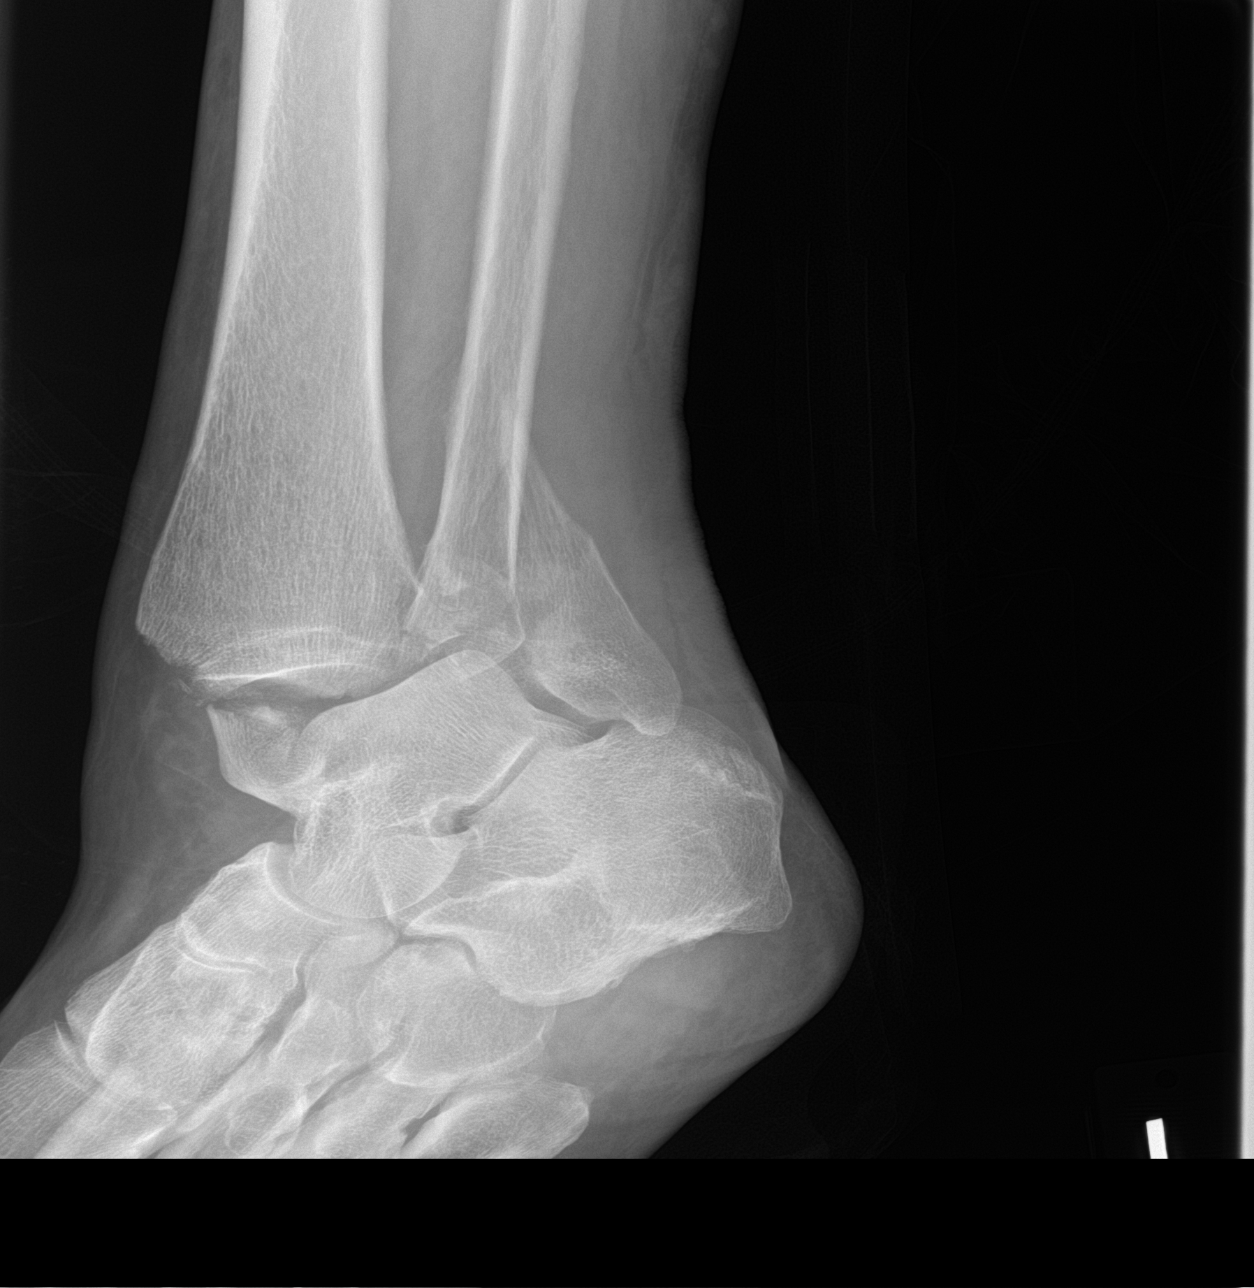

[ankle lat]
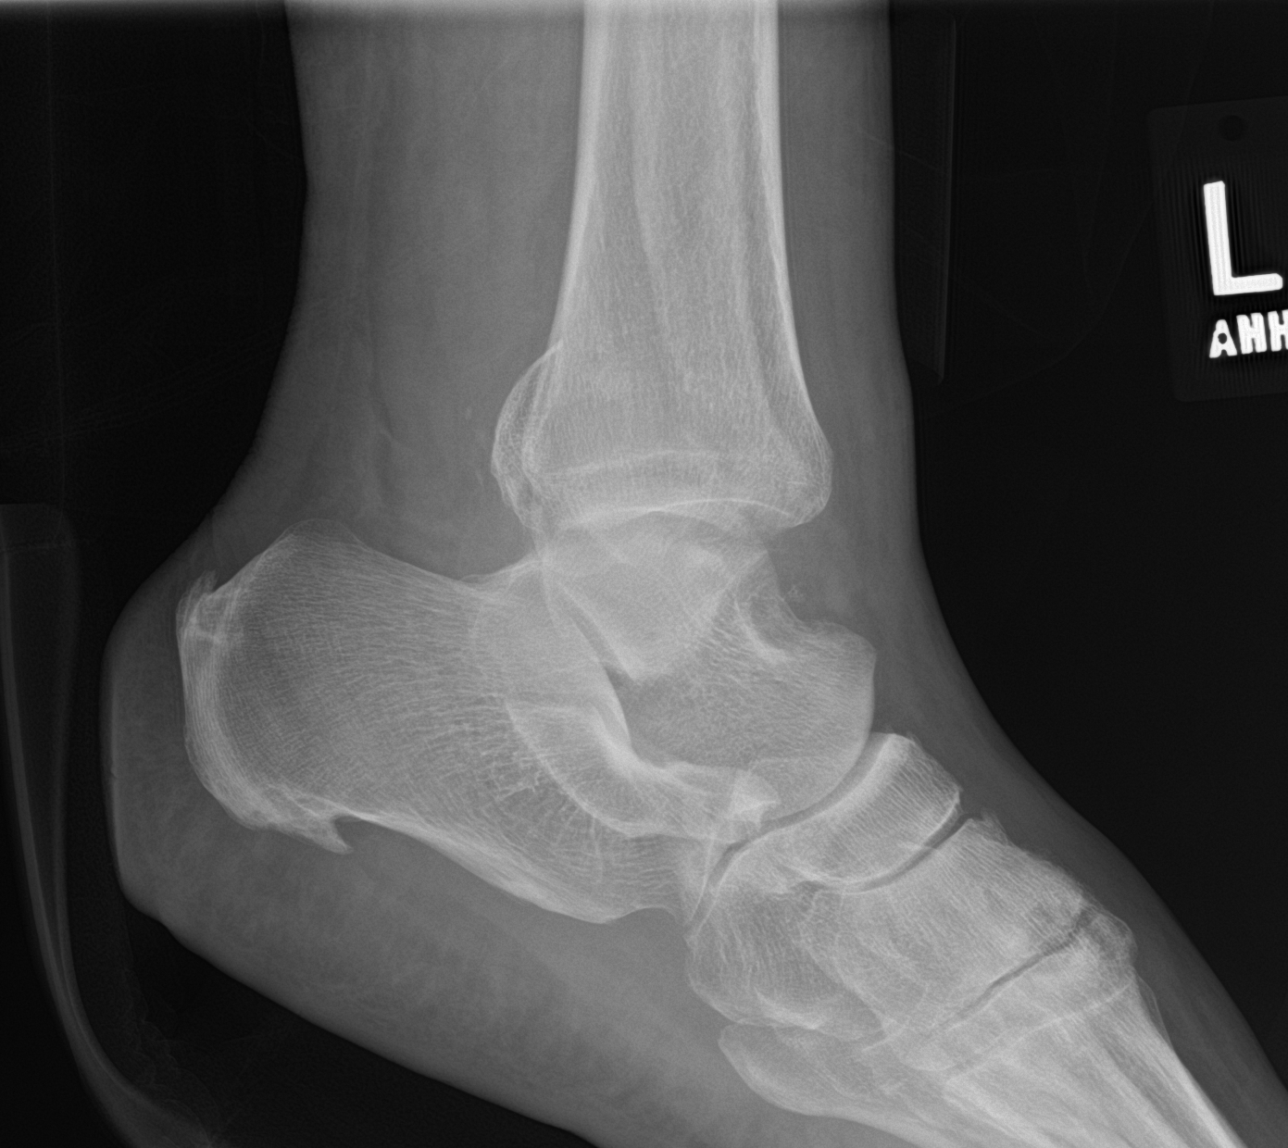

[3 of 3 positions shown; findings below may reference images not displayed]

FINDINGS: Comminuted trimalleolar fracture with lateral dislocation of the
mortise joint. Lateral displacement and angulation of the distal
tibia and fibula fragments. Mildly displaced posterior malleolus.
Over riding and oblique fracture of the distal fibula metadiaphysis.
Regional soft tissue swelling.

Talus and calcaneus appear intact. Other visible bones of the left
foot appear intact.
IMPRESSION: Comminuted trimalleolar left ankle fracture with lateral
dislocation.

## 2022-03-25 ENCOUNTER — Encounter: Payer: Self-pay | Admitting: Family

## 2022-03-25 ENCOUNTER — Ambulatory Visit: Payer: 59 | Admitting: Family

## 2022-03-25 ENCOUNTER — Encounter: Payer: 59 | Attending: Surgery | Admitting: Skilled Nursing Facility1

## 2022-03-25 ENCOUNTER — Encounter: Payer: Self-pay | Admitting: Skilled Nursing Facility1

## 2022-03-25 VITALS — BP 126/80 | HR 88 | Temp 98.2°F | Ht 71.0 in | Wt 265.0 lb

## 2022-03-25 DIAGNOSIS — R7309 Other abnormal glucose: Secondary | ICD-10-CM

## 2022-03-25 DIAGNOSIS — Z23 Encounter for immunization: Secondary | ICD-10-CM

## 2022-03-25 DIAGNOSIS — E669 Obesity, unspecified: Secondary | ICD-10-CM | POA: Insufficient documentation

## 2022-03-25 LAB — COMPREHENSIVE METABOLIC PANEL
ALT: 17 U/L (ref 0–35)
AST: 15 U/L (ref 0–37)
Albumin: 3.5 g/dL (ref 3.5–5.2)
Alkaline Phosphatase: 82 U/L (ref 39–117)
BUN: 16 mg/dL (ref 6–23)
CO2: 27 mEq/L (ref 19–32)
Calcium: 9.2 mg/dL (ref 8.4–10.5)
Chloride: 103 mEq/L (ref 96–112)
Creatinine, Ser: 1.63 mg/dL — ABNORMAL HIGH (ref 0.40–1.20)
GFR: 34.01 mL/min — ABNORMAL LOW (ref >60.00)
Glucose, Bld: 139 mg/dL — ABNORMAL HIGH (ref 70–99)
Potassium: 4.2 mEq/L (ref 3.5–5.1)
Sodium: 138 mEq/L (ref 135–145)
Total Bilirubin: 0.4 mg/dL (ref 0.2–1.2)
Total Protein: 5.5 g/dL — ABNORMAL LOW (ref 6.0–8.3)

## 2022-03-25 LAB — CBC WITH DIFFERENTIAL/PLATELET
Basophils Absolute: 0.1 10*3/uL (ref 0.0–0.1)
Basophils Relative: 0.7 % (ref 0.0–3.0)
Eosinophils Absolute: 0.1 10*3/uL (ref 0.0–0.7)
Eosinophils Relative: 1.6 % (ref 0.0–5.0)
HCT: 45.1 % (ref 36.0–46.0)
Hemoglobin: 13.9 g/dL (ref 12.0–15.0)
Lymphocytes Relative: 29.4 % (ref 12.0–46.0)
Lymphs Abs: 2.1 10*3/uL (ref 0.7–4.0)
MCHC: 30.9 g/dL (ref 30.0–36.0)
MCV: 72.2 fl — ABNORMAL LOW (ref 78.0–100.0)
Monocytes Absolute: 0.5 10*3/uL (ref 0.1–1.0)
Monocytes Relative: 7.1 % (ref 3.0–12.0)
Neutro Abs: 4.5 10*3/uL (ref 1.4–7.7)
Neutrophils Relative %: 61.2 % (ref 43.0–77.0)
Platelets: 222 10*3/uL (ref 150.0–400.0)
RBC: 6.25 Mil/uL — ABNORMAL HIGH (ref 3.87–5.11)
RDW: 17.4 % — ABNORMAL HIGH (ref 11.5–15.5)
WBC: 7.3 10*3/uL (ref 4.0–10.5)

## 2022-03-25 LAB — HEMOGLOBIN A1C: Hgb A1c MFr Bld: 6.5 % (ref 4.6–6.5)

## 2022-03-25 NOTE — Progress Notes (Signed)
Supervised Weight Loss Visit ?Bariatric Nutrition Education ? ?Planned Surgery: sleeve gastrectomy   ? ?3 out of 6 [facility] Appointments  ? ?NUTRITION ASSESSMENT ? ?Anthropometrics  ?Start weight at [facility]: 260.7 lbs (date: 01/21/2022) ?Today's weight: 264 lbs ?BMI: 36.82 kg/m2  (pt is aware of her BMI and insurance coverage)  ? ?Clinical  ?Medical hx: sleep apnea, GERD, cancer of descending colon (some removed), anemia, anxiety, CKD, DM ?Medications: taking B12 monthly  ?Labs: A1C 7.2 (has had an A1C over 6.5 for 7 years), creatinine 1.35, GFR 42.73, RBC 5.53, hemoglobin 11.9, MCV 70.9, RDW 16.6 ?Notable signs/symptoms: migraines, constipation ?Any previous deficiencies? Yes: iron and b12 ? ?Lifestyle & Dietary Hx ? ?Pt states her stress level is better.  States she picked from the "things to do" list, to include walking and going to the park. ?Pt states she drinks water mostly at night, and still working on drinking throughout the day. ?Pt states she loves mac and cheese, but states she has been cutting back. ?Pt states her BM range from twice a day to 3 times a week. ? ?Estimated daily fluid intake: 64+ oz ?Supplements: N/A ?Current average weekly physical activity: Walking ? ?24-Hr Dietary Recall ?First Meal: splenda protein shake + protein bar or eaten out or smoothie ?Snack: protein bar ?Second Meal: 6 chicken wings   ?Snack: fruit ?Third Meal: peanut butter sandwich and cuties (tangerines) or green beans or collard greens, pinto beans ?Snack:  ?Beverages: water, cirkle bottle ? ?Estimated Energy Needs ?Calories: 1600 ? ? ?NUTRITION DIAGNOSIS  ?Overweight/obesity ([place]-3.3) related to past poor dietary habits and physical inactivity as evidenced by patient w/ planned sleeve gastrectomy surgery following dietary guidelines for continued weight loss. ? ? ?NUTRITION INTERVENTION  ?Nutrition counseling (C-1) and education (E-2) to facilitate bariatric surgery goals. ? ?Pre-Op Goals Progress & New Goals ?Continue: Avoid all  sugar-sweetened beverages (ex: regular soda, sports drinks): stop drinking juice ?Continue: Dedicate time to stress relief: drive the cart around [employer] or sit on a bench at the park ?Continue: Read the nutrition facts label  ?Continue: Avoid eating out so often ?continue: do the exercises PT tells you to do, try the bike 1-2 times a week if okayd by PT ?continue: try some plant based proteins throughout the week ?Continue: your new hobby (walking and going to the park) ?Re-engage: drink on water throughout the day instead of chugging it ? ?Handouts Provided Include  ? ? ?Learning Style & Readiness for Change ?Teaching method utilized: Visual & Auditory  ?Demonstrated degree of understanding via: Teach Back  ?Readiness Level: action ?Barriers to learning/adherence to lifestyle change: none identified  ? ?RD's Notes for next Visit  ?Assess pts adherence to chosen goals ? ? ?MONITORING & EVALUATION ?Dietary intake, weekly physical activity, body weight, and pre-op goals in 1 month.  ? ?Next Steps  ?Patient is to return to [facility]  1 month ?

## 2022-03-25 NOTE — Progress Notes (Signed)
?Mary [name] is a 61 y.o. female with the following history as recorded in Epic[facility]:  ?Patient Active Problem List  ? Diagnosis Date Noted  ? Left trimalleolar fracture, closed, initial encounter 12/18/2020  ? Left ankle pain 12/18/2020  ? Labial cyst 03/05/2018  ? Chest pain 05/30/2017  ? Chronic nonintractable headache 05/04/2017  ? Ankle edema, bilateral 05/04/2017  ? Cellulitis 04/04/2016  ? Abnormal x-ray 04/04/2016  ? Hearing loss due to cerumen impaction 01/14/2016  ? Impaired fasting blood sugar 01/14/2016  ? Plantar fasciitis, right 11/22/2015  ? Right calcaneal fracture 08/15/2015  ? OSA (obstructive sleep apnea) 09/26/2014  ? Peripheral neuropathy due to chemotherapy ([place]) 06/05/2014  ? Obesity 06/05/2014  ? history of DVT (deep venous thrombosis) 02/20/2014  ? GERD (gastroesophageal reflux disease) 02/20/2014  ? Hernia 09/22/2012  ? Iron deficiency anemia, unspecified 08/30/2011  ? Colon cancer ([place]) 08/29/2011  ? Cancer of descending colon ([place]) 05/12/2011  ?  ?Current Outpatient Medications  ?Medication Sig Dispense Refill  ? gabapentin (NEURONTIN) 300 MG capsule TAKE 1 CAPSULE BY MOUTH THREE TIMES DAILY 90 capsule 0  ? pantoprazole (PROTONIX) 40 MG tablet Take 1 tablet (40 mg total) by mouth daily. Please call [phone] to schedule an office visit for more refills 30 tablet 0  ? ?Current Facility-Administered Medications  ?Medication Dose Route Frequency Provider Last Rate Last Admin  ? cyanocobalamin ((VITAMIN B-12)) injection 1,000 mcg  1,000 mcg Intramuscular Once Marrian Salvage, FNP      ?  ?Allergies: Tylenol [acetaminophen]  ?Past Medical History:  ?Diagnosis Date  ? Adenocarcinoma sigmoid colon 05/12/11  ? Stage III(pT2pN2b) descending sigmoid  ? Arm DVT (deep venous thromboembolism), acute ([place]) 07/04/11  ? r/t PAC  ? Blood transfusion   ? Cancer of descending colon ([place]) 05/12/2011  ? Diverticulitis   ? GERD (gastroesophageal reflux disease)   ? History of migraine headaches   ?  Incisional hernia   ? iron deficiency anemia   ? Multiple hemangiomas   ? Liver  ? Neuropathy due to drug [facility]) 2012  ? Early--assoc. w/prolonged cold sensitivity--Oxaliplatin related  ? Pneumothorax 07/19/11  ? Bilateral  ? Renal cysts, acquired, bilateral   ? Sleep apnea   ? does not use CPAP on regular basis  ?  ?Past Surgical History:  ?Procedure Laterality Date  ? ABDOMINAL HYSTERECTOMY  mid 20s  ? bleeding  ? COLON SURGERY  05/17/11  ? Partial colectomy  ? COLONOSCOPY  09/04/2014  ? One diminutive polyp at the recto sigmoid colon removed. Diverticulosis of the entire examined colon. Internal hemorrhoids.   ? HERNIA REPAIR  09/06/12  ? lap incisional hernia  ? INSERTION OF MESH  09/06/2012  ? Procedure: INSERTION OF MESH;  Surgeon: Gwenyth Ober, MD;  Location: [place];  Service: General;  Laterality: N/A;  ? ORIF ANKLE FRACTURE Left 12/27/2020  ? Procedure: OPEN REDUCTION INTERNAL FIXATION (ORIF) Left ankle trimalleolar fracture dislocation;  Surgeon: Wylene Simmer, MD;  Location: [place];  Service: Orthopedics;  Laterality: Left;  36mn  ? PORT-A-CATH REMOVAL  05/06/2012  ? Procedure: MINOR REMOVAL PORT-A-CATH;  Surgeon: JGwenyth Ober MD;  [place]  Service: General;  Laterality: Right;  ? PORTACATH PLACEMENT  07/20/11  ? Right side placed  ? PORTACATH PLACEMENT    ? left side & right side  ? VENTRAL HERNIA REPAIR  09/06/2012  ? Procedure: LAPAROSCOPIC VENTRAL HERNIA;  Surgeon: JGwenyth Ober MD;  [place] [facility]  Service: General;  Laterality: N/A;  ?  ?Family History  ?Problem Relation Age of Onset  ? Diabetes Mother   ? Hypertension Mother   ? Cancer Father   ?     leukemia  ? Cancer Maternal Uncle   ?     lung  ? Cancer Maternal Uncle   ?     colon  ? Colitis Maternal Uncle   ? Cancer Maternal Uncle   ?     prostate  ? Cancer Brother   ?     PROSTATE  ? Other Brother   ?     MVA  ? Esophageal cancer Neg Hx   ? Rectal cancer Neg Hx   ? Stomach cancer Neg Hx   ?   ?Social History  ? ?Tobacco Use  ? Smoking status: Never  ? Smokeless tobacco: Never  ?Substance Use Topics  ? Alcohol use: No  ?  ?Subjective:  ?3 month follow up on elevated glucose; is in the process of doing preparatory work to get scheduled for gastric sleeve; hopes to have surgery in the next few months;  ? ? ? ? ?Objective:  ?Vitals:  ? 03/25/22 1301  ?BP: 126/80  ?Pulse: 88  ?Temp: 98.2 ?F (36.8 ?C)  ?TempSrc: Oral  ?SpO2: 92%  ?Weight: 265 lb (120.2 kg)  ?Height: 5' 11"  (1.803 m)  ?  ?General: Well developed, well nourished, in no acute distress  ?Skin : Warm and dry.  ?Head: Normocephalic and atraumatic  ?Eyes: Sclera and conjunctiva clear; pupils round and reactive to light; extraocular movements intact  ?Ears: External normal; canals clear; tympanic membranes normal  ?Oropharynx: Pink, supple. No suspicious lesions  ?Neck: Supple without thyromegaly, adenopathy  ?Lungs: Respirations unlabored;  ?Neurologic: Alert and oriented; speech intact; face symmetrical; moves all extremities well; CNII-XII intact without focal deficit  ? ?Assessment:  ?1. Elevated glucose   ?  ?Plan:  ? ?Update labs today; follow up to be determined; she is in the process of getting scheduled for gastric sleeve surgery and needs her BMI to remain above 35; will need to have her discuss treatment options for her blood sugar with her weight loss surgeon if medication is necessary; ? ?Shingrix #1 given; plan to get #2 with her B12 shot in August 2023;  ? ?Return in about 3 months (around 06/25/2022) for nurse visit for B12 and Shingrix #2.  ?Orders Placed This Encounter  ?Procedures  ? Varicella-zoster vaccine IM (Shingrix)  ? CBC with Differential/Platelet  ? Comp Met (CMET)  ? Hemoglobin A1c  ?  ?Requested Prescriptions  ? ? No prescriptions requested or ordered in this encounter  ?  ? ?

## 2022-03-28 ENCOUNTER — Other Ambulatory Visit: Payer: Self-pay | Admitting: Family

## 2022-03-28 DIAGNOSIS — R7989 Other specified abnormal findings of blood chemistry: Secondary | ICD-10-CM

## 2022-04-01 ENCOUNTER — Other Ambulatory Visit: Payer: Self-pay | Admitting: Family

## 2022-04-01 ENCOUNTER — Other Ambulatory Visit: Payer: Self-pay | Admitting: Gastroenterology

## 2022-04-01 ENCOUNTER — Other Ambulatory Visit (INDEPENDENT_AMBULATORY_CARE_PROVIDER_SITE_OTHER): Payer: 59

## 2022-04-01 DIAGNOSIS — R7989 Other specified abnormal findings of blood chemistry: Secondary | ICD-10-CM

## 2022-04-01 LAB — COMPREHENSIVE METABOLIC PANEL
ALT: 15 U/L (ref 0–35)
AST: 13 U/L (ref 0–37)
Albumin: 3.4 g/dL — ABNORMAL LOW (ref 3.5–5.2)
Alkaline Phosphatase: 75 U/L (ref 39–117)
BUN: 17 mg/dL (ref 6–23)
CO2: 30 mEq/L (ref 19–32)
Calcium: 8.9 mg/dL (ref 8.4–10.5)
Chloride: 104 mEq/L (ref 96–112)
Creatinine, Ser: 1.56 mg/dL — ABNORMAL HIGH (ref 0.40–1.20)
GFR: 35.85 mL/min — ABNORMAL LOW (ref >60.00)
Glucose, Bld: 170 mg/dL — ABNORMAL HIGH (ref 70–99)
Potassium: 4.3 mEq/L (ref 3.5–5.1)
Sodium: 139 mEq/L (ref 135–145)
Total Bilirubin: 0.3 mg/dL (ref 0.2–1.2)
Total Protein: 5.3 g/dL — ABNORMAL LOW (ref 6.0–8.3)

## 2022-04-03 ENCOUNTER — Telehealth: Payer: Self-pay | Admitting: Gastroenterology

## 2022-04-03 MED ORDER — PANTOPRAZOLE SODIUM 40 MG PO TBEC
40.0000 mg | DELAYED_RELEASE_TABLET | Freq: Every day | ORAL | 1 refills | Status: DC
Start: 2022-04-03 — End: 2022-07-21

## 2022-04-03 NOTE — Telephone Encounter (Signed)
Pt was given a  6 month supply and she didn't want to make an appointment but will call in August to schedule an appointment and she voiced understanding about making an appointment for more refills

## 2022-04-03 NOTE — Telephone Encounter (Signed)
Inbound call from patient wanting to speak with nurse about her medication. Please advise.

## 2022-04-17 ENCOUNTER — Ambulatory Visit (INDEPENDENT_AMBULATORY_CARE_PROVIDER_SITE_OTHER): Payer: 59

## 2022-04-17 DIAGNOSIS — E538 Deficiency of other specified B group vitamins: Secondary | ICD-10-CM | POA: Diagnosis not present

## 2022-04-17 MED ORDER — CYANOCOBALAMIN 1000 MCG/ML IJ SOLN
1000.0000 ug | Freq: Once | INTRAMUSCULAR | Status: AC
  Administered 2022-04-17: 1000 ug via INTRAMUSCULAR

## 2022-04-17 NOTE — Progress Notes (Signed)
Mary [name] is a 61 y.o. female presents to the office today for B12: injections, per physician's orders. Original order:per Jodi Mourning NP, on lab results from 12-16-21 to continue monthly B12.  cyanocobalamin (med), 1000 mg/ 39m (dose), IM (route) was administered right deltoid (location) today. Patient tolerated injection.  Patient next injection due: in one month, appt made Yes 04-17-2022

## 2022-04-22 ENCOUNTER — Encounter: Payer: 59 | Attending: Family | Admitting: Skilled Nursing Facility1

## 2022-04-22 ENCOUNTER — Other Ambulatory Visit: Payer: Self-pay

## 2022-04-22 ENCOUNTER — Encounter: Payer: Self-pay | Admitting: Skilled Nursing Facility1

## 2022-04-22 ENCOUNTER — Ambulatory Visit: Payer: 59 | Admitting: Skilled Nursing Facility1

## 2022-04-22 ENCOUNTER — Other Ambulatory Visit: Payer: Self-pay | Admitting: Family

## 2022-04-22 ENCOUNTER — Telehealth: Payer: Self-pay | Admitting: Family

## 2022-04-22 DIAGNOSIS — N189 Chronic kidney disease, unspecified: Secondary | ICD-10-CM | POA: Insufficient documentation

## 2022-04-22 DIAGNOSIS — E669 Obesity, unspecified: Secondary | ICD-10-CM | POA: Insufficient documentation

## 2022-04-22 MED ORDER — BLOOD GLUCOSE MONITOR KIT: PACK | 0 refills | Status: AC

## 2022-04-22 NOTE — Telephone Encounter (Signed)
Pt called stating she had just had an appt with her nutritionist and they suggested she start checking her blood sugar regularly. Pt was wondering if Mickel Baas could call in a glucose meter and test strips so she could start checking.

## 2022-04-22 NOTE — Progress Notes (Signed)
Supervised Weight Loss Visit Bariatric Nutrition Education  Planned Surgery: sleeve gastrectomy    4 out of 6 [facility] Appointments   NUTRITION ASSESSMENT  Anthropometrics  [facility] weight at [facility]: 260.7 lbs (date: 01/21/2022) Today's weight: 266 lbs BMI: 37.18 kg/m2  (pt is aware of her BMI and insurance coverage)   Clinical  Medical hx: sleep apnea, GERD, cancer of descending colon (some removed), anemia, anxiety, CKD, DM Medications: taking B12 monthly  Labs: A1C 6.5, creatinine 1.56, GFR 35.85, albumin 3.4 Notable signs/symptoms: migraines, constipation Any previous deficiencies? Yes: iron and b12  Lifestyle & Dietary Hx  Pt states he is willing to check her blood sugars.  Pt states she will be working with a nephrologist.  Check blood sugar in office having eaten about an hour ago: spinach and rice 155 Pt states she thinks with seeing the numbers she may make different food decisions.   Estimated daily fluid intake: 64+ oz Supplements: N/A Current average weekly physical activity: Walking 2 times a week around her complex  24-Hr Dietary Recall First Meal: splenda protein shake + protein bar or eaten out or smoothie or spinach and white rice Snack: protein bar Second Meal: 6 chicken wings  or blueberries Snack: fruit Third Meal: peanut butter sandwich and cuties (tangerines) or green beans or collard greens, pinto beans or spagetti and shrimp Snack:  Beverages: water, cirkle bottle  Estimated Energy Needs Calories: 1600   NUTRITION DIAGNOSIS  Overweight/obesity ([place]-3.3) related to past poor dietary habits and physical inactivity as evidenced by patient w/ planned sleeve gastrectomy surgery following dietary guidelines for continued weight loss.   NUTRITION INTERVENTION  Nutrition counseling (C-1) and education (E-2) to facilitate bariatric surgery goals.  Pre-Op Goals Progress & New Goals Continue: Avoid all sugar-sweetened beverages (ex: regular soda, sports drinks):  stop drinking juice Continue: Dedicate time to stress relief: drive the cart around [employer] or sit on a bench at the park Continue: Read the nutrition facts label  Continue: Avoid eating out so often continue: do the exercises PT tells you to do, try the bike 1-2 times a week if okayd by PT continue: try some plant based proteins throughout the week Continue: your new hobby (walking and going to the park) continue: drink on water throughout the day instead of chugging it NEW: check your blood sugar daily  Handouts Provided Include    Learning Style & Readiness for Change Teaching method utilized: Visual & Auditory  Demonstrated degree of understanding via: Teach Back  Readiness Level: action Barriers to learning/adherence to lifestyle change: none identified   RD's Notes for next Visit  Assess pts adherence to chosen goals   MONITORING & EVALUATION Dietary intake, weekly physical activity, body weight, and pre-op goals in 1 month.   Next Steps  Patient is to return to [facility]  1 month

## 2022-04-22 NOTE — Telephone Encounter (Signed)
Pt has a Dx of IFG, but not diabetes- I do not think ins will cover for the supplies. Is she seeing the nutritionist for IFG?

## 2022-04-23 NOTE — Telephone Encounter (Signed)
Mychart message sent.

## 2022-05-14 ENCOUNTER — Other Ambulatory Visit: Payer: Self-pay | Admitting: Gastroenterology

## 2022-05-14 ENCOUNTER — Ambulatory Visit: Payer: 59 | Admitting: Skilled Nursing Facility1

## 2022-05-14 ENCOUNTER — Ambulatory Visit (INDEPENDENT_AMBULATORY_CARE_PROVIDER_SITE_OTHER): Payer: 59

## 2022-05-14 DIAGNOSIS — E538 Deficiency of other specified B group vitamins: Secondary | ICD-10-CM

## 2022-05-14 MED ORDER — CYANOCOBALAMIN 1000 MCG/ML IJ SOLN
1000.0000 ug | INTRAMUSCULAR | Status: DC
  Administered 2022-05-14: 1000 ug via INTRAMUSCULAR

## 2022-05-14 NOTE — Progress Notes (Signed)
Pt here for monthly B12 injection per Jodi Mourning  B12 1035mg given left IM, and pt tolerated injection well.  Next B12 injection scheduled for 06/24/22 for B12 and Shingles

## 2022-05-15 ENCOUNTER — Ambulatory Visit: Payer: 59

## 2022-05-15 ENCOUNTER — Ambulatory Visit: Payer: 59 | Admitting: Skilled Nursing Facility1

## 2022-05-16 ENCOUNTER — Ambulatory Visit: Payer: 59

## 2022-05-20 ENCOUNTER — Ambulatory Visit: Payer: 59 | Admitting: Skilled Nursing Facility1

## 2022-05-22 ENCOUNTER — Encounter: Payer: 59 | Attending: Surgery | Admitting: Dietician

## 2022-05-22 ENCOUNTER — Encounter: Payer: Self-pay | Admitting: Dietician

## 2022-05-22 DIAGNOSIS — Z713 Dietary counseling and surveillance: Secondary | ICD-10-CM | POA: Insufficient documentation

## 2022-05-22 DIAGNOSIS — E669 Obesity, unspecified: Secondary | ICD-10-CM | POA: Diagnosis not present

## 2022-05-22 DIAGNOSIS — Z6837 Body mass index (BMI) 37.0-37.9, adult: Secondary | ICD-10-CM | POA: Insufficient documentation

## 2022-05-22 NOTE — Progress Notes (Signed)
Supervised Weight Loss Visit Bariatric Nutrition Education  Planned Surgery: sleeve gastrectomy    5 out of 6 SWL Appointments   NUTRITION ASSESSMENT  Anthropometrics  Start weight at [facility]: 260.7 lbs (date: 01/21/2022) Today's weight: 272.1 lbs (with boot on) BMI: 37.18 kg/m2  (pt is aware of her BMI and insurance coverage)   Clinical  Medical hx: sleep apnea, GERD, cancer of descending colon (some removed), anemia, anxiety, CKD, DM Medications: taking B12 monthly  Labs: A1C 6.5, creatinine 1.56, GFR 35.85, albumin 3.4 Notable signs/symptoms: migraines, constipation Any previous deficiencies? Yes: iron and b12  Lifestyle & Dietary Hx  Pt states she has a brace and boot on, stating she went to work too quick after screw and plate was taken out. Pt states orthopedic surgeon might have to do an infusion on her foot, stating they told her it might be awhile before she can do physical activity. Pt states she is steady gaining wt, and job is stressing her. Pt states her blood sugars have been pretty good, stating they have been 114 before a meal, 149 two after meals. Pt states she has not taken a fasting blood sugar.  Dietitian suggested taking a fasting blood sugar first thing in the morning, as well as 2 hours after meals, recording the meal and the blood sugar reading. Pt states she uses breathing exercises for stress and get away at lunch time (sitting in her car at work). Pt states she doesn't always get hungry. Pt states she participates in [employer] program, stating it is for 52 weeks every Friday and is bariatric requirement.   Estimated daily fluid intake: 64+ oz Supplements: N/A Current average weekly physical activity: work and ADLs, physical activity limited since screws and plate removed.  24-Hr Dietary Recall First Meal: premier protein shake  Snack:  Second Meal: skip or 6 chicken wings from home or chicken sandwich green bean or corn (take out) Snack: fruit Third  Meal: corned beef hash and ritz crackers Snack: fruit like blueberries and cherries or popcorn Beverages: water  Estimated Energy Needs Calories: 1600   NUTRITION DIAGNOSIS  Overweight/obesity ([place]-3.3) related to past poor dietary habits and physical inactivity as evidenced by patient w/ planned sleeve gastrectomy surgery following dietary guidelines for continued weight loss.   NUTRITION INTERVENTION  Nutrition counseling (C-1) and education (E-2) to facilitate bariatric surgery goals.  Pre-Op Goals Progress & New Goals Continue: Avoid all sugar-sweetened beverages (ex: regular soda, sports drinks): stop drinking juice Continue: Dedicate time to stress relief: drive the cart around [employer] or sit on a bench at the park Continue: Read the nutrition facts label  Continue: Avoid eating out so often continue: do the exercises PT tells you to do, try the bike 1-2 times a week if okayd by PT continue: try some plant based proteins throughout the week Continue: your new hobby (walking and going to the park) continue: drink on water throughout the day instead of chugging it Continue: check your blood sugar daily, fasting and two hours after meals.  Record meals and blood sugars. New: focus on non-starchy vegetables at lunch and dinner meals  Handouts Provided Include  Meal Ideas handout  Learning Style & Readiness for Change Teaching method utilized: Visual & Auditory  Demonstrated degree of understanding via: Teach Back  Readiness Level: action Barriers to learning/adherence to lifestyle change: none identified   RD's Notes for next Visit  Assess pts adherence to chosen goals   MONITORING & EVALUATION Dietary intake, weekly physical activity,  body weight, and pre-op goals in 1 month.   Next Steps  Patient is to return to [facility]  1 month

## 2022-06-24 ENCOUNTER — Ambulatory Visit: Payer: 59 | Admitting: Skilled Nursing Facility1

## 2022-06-24 ENCOUNTER — Ambulatory Visit: Payer: 59

## 2022-06-26 ENCOUNTER — Encounter: Payer: 59 | Attending: Surgery | Admitting: Dietician

## 2022-06-26 ENCOUNTER — Ambulatory Visit (INDEPENDENT_AMBULATORY_CARE_PROVIDER_SITE_OTHER): Payer: 59

## 2022-06-26 ENCOUNTER — Encounter: Payer: Self-pay | Admitting: Dietician

## 2022-06-26 DIAGNOSIS — Z23 Encounter for immunization: Secondary | ICD-10-CM | POA: Diagnosis not present

## 2022-06-26 DIAGNOSIS — R7989 Other specified abnormal findings of blood chemistry: Secondary | ICD-10-CM

## 2022-06-26 DIAGNOSIS — E669 Obesity, unspecified: Secondary | ICD-10-CM | POA: Diagnosis present

## 2022-06-26 DIAGNOSIS — E538 Deficiency of other specified B group vitamins: Secondary | ICD-10-CM | POA: Diagnosis not present

## 2022-06-26 MED ORDER — CYANOCOBALAMIN 1000 MCG/ML IJ SOLN
1000.0000 ug | Freq: Once | INTRAMUSCULAR | Status: AC
  Administered 2022-06-26: 1000 ug via INTRAMUSCULAR

## 2022-06-26 NOTE — Progress Notes (Signed)
Pt here for monthly B12 injection per Jodi Mourning  B12 1074mg given IM R deltoid, and pt tolerated injection well.  Next B12 injection scheduled for 1 month.    Pt also received Shingrix #2 today.   Shingrix injected into L deltoid IM. Pt tolerated injection well.

## 2022-06-26 NOTE — Progress Notes (Signed)
Supervised Weight Loss Visit Bariatric Nutrition Education  Planned Surgery: sleeve gastrectomy    6 out of 6 SWL Appointments   Pt completed visits.   Pt has cleared nutrition requirements.   NUTRITION ASSESSMENT  Anthropometrics  Start weight at [facility]: 260.7 lbs (date: 01/21/2022) Height: 71 inches Today's weight: 264.9 lbs (with boot on) BMI: 36.95 kg/m2  (pt is aware of her BMI and insurance coverage)  Clinical  Medical hx: sleep apnea, GERD, cancer of descending colon (some removed), anemia, anxiety, CKD, DM Medications: taking B12 monthly,  Labs: A1C 6.5, creatinine 1.56, GFR 35.85, albumin 3.4 Notable signs/symptoms: migraines, constipation Any previous deficiencies? Yes: iron and b12  Lifestyle & Dietary Hx  Pt states she is on a medication for inflammation for her foot. Pt states she changed her diet, stating she is not eating fried food now. Pt states she has done well with her eating habits, stating if she hadn't learned these things, she would not have been able to change her diet. Pt states she needs to concentrate on drinking more water, and be consistent with food choices. Pt states her fasting blood sugar this morning was 127, and two hours after eating she states it was 105.  Estimated daily fluid intake: 64+ oz Supplements: N/A Current average weekly physical activity: active and walking at work and ADLs, physical activity limited since screws and plate removed.  24-Hr Dietary Recall First Meal: Pop tart at work Snack:  Second Meal: banana (1/2), blue berries and strawberries Snack: fruit Third Meal: black bean egg rolls, grilled shrimp and chicken and pinto beans Snack:  Beverages: water  Estimated Energy Needs Calories: 1600   NUTRITION DIAGNOSIS  Overweight/obesity ([place]-3.3) related to past poor dietary habits and physical inactivity as evidenced by patient w/ planned sleeve gastrectomy surgery following dietary guidelines for continued weight  loss.   NUTRITION INTERVENTION  Nutrition counseling (C-1) and education (E-2) to facilitate bariatric surgery goals.  Pre-Op Goals Progress & New Goals Continue: Avoid all sugar-sweetened beverages (ex: regular soda, sports drinks): stop drinking juice Continue: Dedicate time to stress relief: drive the cart around [employer] or sit on a bench at the park Continue: Read the nutrition facts label  Continue: Avoid eating out so often continue: do the exercises PT tells you to do, try the bike 1-2 times a week if okayd by PT continue: try some plant based proteins throughout the week Continue: your new hobby (walking and going to the park) continue: drink on water throughout the day instead of chugging it Continue: check your blood sugar daily, fasting and two hours after meals.  Record meals and blood sugars. Continue: focus on non-starchy vegetables at lunch and dinner meals  Handouts Provided Include    Learning Style & Readiness for Change Teaching method utilized: Visual & Auditory  Demonstrated degree of understanding via: Teach Back  Readiness Level: action Barriers to learning/adherence to lifestyle change: none identified   RD's Notes for next Visit  Assess pts adherence to chosen goals   MONITORING & EVALUATION Dietary intake, weekly physical activity, body weight, and pre-op goals in 1 month.   Next Steps  Pt has completed visits. No further supervised visits required/recomended  Patient is to follow up at [facility] for Pre-Op Class >2 weeks before surgery for further nutrition education.

## 2022-07-03 ENCOUNTER — Other Ambulatory Visit: Payer: Self-pay | Admitting: Nephrology

## 2022-07-03 DIAGNOSIS — E669 Obesity, unspecified: Secondary | ICD-10-CM

## 2022-07-03 DIAGNOSIS — D631 Anemia in chronic kidney disease: Secondary | ICD-10-CM

## 2022-07-03 DIAGNOSIS — N2581 Secondary hyperparathyroidism of renal origin: Secondary | ICD-10-CM

## 2022-07-03 DIAGNOSIS — N1832 Chronic kidney disease, stage 3b: Secondary | ICD-10-CM

## 2022-07-03 DIAGNOSIS — N281 Cyst of kidney, acquired: Secondary | ICD-10-CM

## 2022-07-04 ENCOUNTER — Ambulatory Visit
Admission: RE | Admit: 2022-07-04 | Discharge: 2022-07-04 | Disposition: A | Discharge status: Home / Self Care | Payer: 59 | Source: Ambulatory Visit | Attending: Nephrology | Admitting: Nephrology

## 2022-07-04 DIAGNOSIS — E669 Obesity, unspecified: Secondary | ICD-10-CM

## 2022-07-04 DIAGNOSIS — N2581 Secondary hyperparathyroidism of renal origin: Secondary | ICD-10-CM

## 2022-07-04 DIAGNOSIS — D631 Anemia in chronic kidney disease: Secondary | ICD-10-CM

## 2022-07-04 DIAGNOSIS — N281 Cyst of kidney, acquired: Secondary | ICD-10-CM

## 2022-07-04 DIAGNOSIS — N1832 Chronic kidney disease, stage 3b: Secondary | ICD-10-CM

## 2022-07-06 ENCOUNTER — Emergency Department (HOSPITAL_COMMUNITY): Payer: 59

## 2022-07-06 ENCOUNTER — Emergency Department (HOSPITAL_COMMUNITY)
Admission: EM | Admit: 2022-07-06 | Discharge: 2022-07-06 | Discharge status: Against Medical Advice | Payer: 59 | Attending: Student | Admitting: Student

## 2022-07-06 ENCOUNTER — Other Ambulatory Visit: Payer: Self-pay

## 2022-07-06 DIAGNOSIS — Z5321 Procedure and treatment not carried out due to patient leaving prior to being seen by health care provider: Secondary | ICD-10-CM | POA: Insufficient documentation

## 2022-07-06 DIAGNOSIS — T17208A Unspecified foreign body in pharynx causing other injury, initial encounter: Secondary | ICD-10-CM | POA: Insufficient documentation

## 2022-07-06 DIAGNOSIS — R0602 Shortness of breath: Secondary | ICD-10-CM | POA: Insufficient documentation

## 2022-07-06 DIAGNOSIS — R079 Chest pain, unspecified: Secondary | ICD-10-CM | POA: Insufficient documentation

## 2022-07-06 DIAGNOSIS — X58XXXA Exposure to other specified factors, initial encounter: Secondary | ICD-10-CM | POA: Insufficient documentation

## 2022-07-06 LAB — BASIC METABOLIC PANEL
Anion gap: 6 (ref 5–15)
BUN: 15 mg/dL (ref 8–23)
CO2: 24 mmol/L (ref 22–32)
Calcium: 9 mg/dL (ref 8.9–10.3)
Chloride: 107 mmol/L (ref 98–111)
Creatinine, Ser: 1.47 mg/dL — ABNORMAL HIGH (ref 0.44–1.00)
GFR, Estimated: 40 mL/min — ABNORMAL LOW (ref >60)
Glucose, Bld: 96 mg/dL (ref 70–99)
Potassium: 3.9 mmol/L (ref 3.5–5.1)
Sodium: 137 mmol/L (ref 135–145)

## 2022-07-06 LAB — CBC WITH DIFFERENTIAL/PLATELET
Abs Immature Granulocytes: 0.03 10*3/uL (ref 0.00–0.07)
Basophils Absolute: 0.1 10*3/uL (ref 0.0–0.1)
Basophils Relative: 1 %
Eosinophils Absolute: 0.1 10*3/uL (ref 0.0–0.5)
Eosinophils Relative: 1 %
HCT: 40.6 % (ref 36.0–46.0)
Hemoglobin: 12.8 g/dL (ref 12.0–15.0)
Immature Granulocytes: 0 %
Lymphocytes Relative: 30 %
Lymphs Abs: 2.3 10*3/uL (ref 0.7–4.0)
MCH: 22.4 pg — ABNORMAL LOW (ref 26.0–34.0)
MCHC: 31.5 g/dL (ref 30.0–36.0)
MCV: 71 fL — ABNORMAL LOW (ref 80.0–100.0)
Monocytes Absolute: 0.7 10*3/uL (ref 0.1–1.0)
Monocytes Relative: 9 %
Neutro Abs: 4.6 10*3/uL (ref 1.7–7.7)
Neutrophils Relative %: 59 %
Platelets: 177 10*3/uL (ref 150–400)
RBC: 5.72 MIL/uL — ABNORMAL HIGH (ref 3.87–5.11)
RDW: 17.4 % — ABNORMAL HIGH (ref 11.5–15.5)
WBC: 7.8 10*3/uL (ref 4.0–10.5)
nRBC: 0 % (ref 0.0–0.2)

## 2022-07-06 LAB — TROPONIN I (HIGH SENSITIVITY): Troponin I (High Sensitivity): 3 ng/L (ref <18)

## 2022-07-06 NOTE — ED Notes (Signed)
Pt stated that the wait was too long and she does not want to wait any longer.

## 2022-07-06 NOTE — ED Triage Notes (Signed)
Pt here for pain in center of chest. Pt reports approx 1 hour ago she was eating a peach and felt it get stuck in her esophagus, pt then attempted to eat bread to get it to pass but she said it made it worse. Pt reports that now she is having cp and sob.

## 2022-07-06 NOTE — ED Provider Triage Note (Signed)
Emergency Medicine Provider Triage Evaluation Note  Mary [name] , a 61 y.o. female  was evaluated in triage.  Pt complains of chest pain and food bolus. States earlier she was eating peaches and felt like it got stuck in her chest. States that she then ate bread in attempt to wash it down but made her symptoms worse. She states she drank water after this and felt like her chest was swelling. Did not throw water back up. She continues to feel short of breath and have central non radiating chest pain. .  Review of Systems  Positive: See above Negative:   Physical Exam  BP (!) 147/91 (BP Location: Right Arm)   Pulse 87   Temp 98.1 F (36.7 C) (Oral)   Resp (!) 22   SpO2 96%  Gen:   Awake, no distress   Resp:  Normal effort  MSK:   Moves extremities without difficulty  Other:  Lungs CTAB. No stridor or respiratory distress. She is speaking in full sentences with no hypoxia. No oropharyngeal swelling.   Medical Decision Making  Medically screening exam initiated at 4:44 PM.  Appropriate orders placed.  Annett Gula was informed that the remainder of the evaluation will be completed by another provider, this initial triage assessment does not replace that evaluation, and the importance of remaining in the ED until their evaluation is complete.  Would not be a typical ACS but will obtain labs to ensure.    Mickie Hillier, PA-C 07/06/22 1649

## 2022-07-08 ENCOUNTER — Ambulatory Visit: Payer: 59 | Admitting: Family

## 2022-07-08 ENCOUNTER — Ambulatory Visit: Admission: EM | Admit: 2022-07-08 | Discharge: 2022-07-08 | Discharge status: Absent without leave | Payer: 59

## 2022-07-08 ENCOUNTER — Encounter (HOSPITAL_BASED_OUTPATIENT_CLINIC_OR_DEPARTMENT_OTHER): Payer: Self-pay | Admitting: Emergency Medicine

## 2022-07-08 ENCOUNTER — Emergency Department (HOSPITAL_BASED_OUTPATIENT_CLINIC_OR_DEPARTMENT_OTHER): Payer: 59

## 2022-07-08 ENCOUNTER — Emergency Department (HOSPITAL_BASED_OUTPATIENT_CLINIC_OR_DEPARTMENT_OTHER)
Admission: EM | Admit: 2022-07-08 | Discharge: 2022-07-08 | Disposition: A | Discharge status: Home / Self Care | Payer: 59 | Attending: Emergency Medicine | Admitting: Emergency Medicine

## 2022-07-08 ENCOUNTER — Telehealth: Payer: Self-pay | Admitting: Emergency Medicine

## 2022-07-08 DIAGNOSIS — R0989 Other specified symptoms and signs involving the circulatory and respiratory systems: Secondary | ICD-10-CM | POA: Diagnosis not present

## 2022-07-08 DIAGNOSIS — Z85038 Personal history of other malignant neoplasm of large intestine: Secondary | ICD-10-CM | POA: Diagnosis not present

## 2022-07-08 DIAGNOSIS — R0789 Other chest pain: Secondary | ICD-10-CM | POA: Diagnosis present

## 2022-07-08 DIAGNOSIS — I10 Essential (primary) hypertension: Secondary | ICD-10-CM | POA: Insufficient documentation

## 2022-07-08 HISTORY — DX: Essential (primary) hypertension: I10

## 2022-07-08 LAB — COMPREHENSIVE METABOLIC PANEL
ALT: 19 U/L (ref 0–44)
AST: 20 U/L (ref 15–41)
Albumin: 3.7 g/dL (ref 3.5–5.0)
Alkaline Phosphatase: 75 U/L (ref 38–126)
Anion gap: 5 (ref 5–15)
BUN: 13 mg/dL (ref 8–23)
CO2: 26 mmol/L (ref 22–32)
Calcium: 8.8 mg/dL — ABNORMAL LOW (ref 8.9–10.3)
Chloride: 107 mmol/L (ref 98–111)
Creatinine, Ser: 1.2 mg/dL — ABNORMAL HIGH (ref 0.44–1.00)
GFR, Estimated: 52 mL/min — ABNORMAL LOW (ref >60)
Glucose, Bld: 88 mg/dL (ref 70–99)
Potassium: 3.6 mmol/L (ref 3.5–5.1)
Sodium: 138 mmol/L (ref 135–145)
Total Bilirubin: 0.5 mg/dL (ref 0.3–1.2)
Total Protein: 6.6 g/dL (ref 6.5–8.1)

## 2022-07-08 LAB — CBC WITH DIFFERENTIAL/PLATELET
Abs Immature Granulocytes: 0.03 10*3/uL (ref 0.00–0.07)
Basophils Absolute: 0.1 10*3/uL (ref 0.0–0.1)
Basophils Relative: 1 %
Eosinophils Absolute: 0.1 10*3/uL (ref 0.0–0.5)
Eosinophils Relative: 1 %
HCT: 38.9 % (ref 36.0–46.0)
Hemoglobin: 12.3 g/dL (ref 12.0–15.0)
Immature Granulocytes: 0 %
Lymphocytes Relative: 27 %
Lymphs Abs: 2.5 10*3/uL (ref 0.7–4.0)
MCH: 22.2 pg — ABNORMAL LOW (ref 26.0–34.0)
MCHC: 31.6 g/dL (ref 30.0–36.0)
MCV: 70.2 fL — ABNORMAL LOW (ref 80.0–100.0)
Monocytes Absolute: 0.7 10*3/uL (ref 0.1–1.0)
Monocytes Relative: 8 %
Neutro Abs: 5.7 10*3/uL (ref 1.7–7.7)
Neutrophils Relative %: 63 %
Platelets: 190 10*3/uL (ref 150–400)
RBC: 5.54 MIL/uL — ABNORMAL HIGH (ref 3.87–5.11)
RDW: 17.2 % — ABNORMAL HIGH (ref 11.5–15.5)
WBC: 9.2 10*3/uL (ref 4.0–10.5)
nRBC: 0 % (ref 0.0–0.2)

## 2022-07-08 LAB — TROPONIN I (HIGH SENSITIVITY)
Troponin I (High Sensitivity): 3 ng/L (ref <18)
Troponin I (High Sensitivity): 3 ng/L (ref <18)

## 2022-07-08 LAB — LIPASE, BLOOD: Lipase: 26 U/L (ref 11–51)

## 2022-07-08 MED ORDER — FAMOTIDINE IN NACL 20-0.9 MG/50ML-% IV SOLN
20.0000 mg | Freq: Once | INTRAVENOUS | Status: AC
  Administered 2022-07-08: 20 mg via INTRAVENOUS
  Filled 2022-07-08: qty 50

## 2022-07-08 MED ORDER — GLUCAGON HCL RDNA (DIAGNOSTIC) 1 MG IJ SOLR
1.0000 mg | Freq: Once | INTRAMUSCULAR | Status: AC
  Administered 2022-07-08: 1 mg via INTRAVENOUS
  Filled 2022-07-08: qty 1

## 2022-07-08 MED ORDER — DIAZEPAM 5 MG/ML IJ SOLN
5.0000 mg | Freq: Once | INTRAMUSCULAR | Status: AC
Start: 2022-07-08 — End: 2022-07-08
  Administered 2022-07-08: 5 mg via INTRAVENOUS
  Filled 2022-07-08: qty 2

## 2022-07-08 MED ORDER — SODIUM CHLORIDE 0.9 % IV BOLUS
1000.0000 mL | Freq: Once | INTRAVENOUS | Status: AC
  Administered 2022-07-08: 1000 mL via INTRAVENOUS

## 2022-07-08 MED ORDER — ALUM & MAG HYDROXIDE-SIMETH 200-200-20 MG/5ML PO SUSP
30.0000 mL | Freq: Once | ORAL | Status: AC
  Administered 2022-07-08: 30 mL via ORAL
  Filled 2022-07-08: qty 30

## 2022-07-08 MED ORDER — LIDOCAINE VISCOUS HCL 2 % MT SOLN
15.0000 mL | Freq: Once | OROMUCOSAL | Status: AC
  Administered 2022-07-08: 15 mL via ORAL
  Filled 2022-07-08: qty 15

## 2022-07-08 MED ORDER — SUCRALFATE 1 G PO TABS: 1.0000 g | ORAL_TABLET | Freq: Three times a day (TID) | ORAL | 0 refills | Status: DC

## 2022-07-08 NOTE — Telephone Encounter (Signed)
Pt checked into [facility] and then left saying she had another appointment; was noted in chart possible foreign body to throat; called pt and informed for results; pt sts was going to go to [facility]

## 2022-07-08 NOTE — ED Notes (Signed)
A&O ambulating unassisted

## 2022-07-08 NOTE — ED Triage Notes (Signed)
Pt arrives pov, steady gait, reports eating a peach x 2 days pta, and felt it get stuck in her throat, then ate bread to help it to pass but no improvement. Pt c/o cp and shob. PT able to speak in complete sentences. Left AMA from [facility] on 8/27. Pt reports CXR indicated foreign body in throat

## 2022-07-08 NOTE — ED Provider Notes (Signed)
[facility] EMERGENCY DEPARTMENT Provider Note   CSN: [id number] Arrival date & time: 07/08/22  1415     History  Chief Complaint  Patient presents with   Foreign Body    Throat    Mary [name] is a 61 y.o. female.  Patient as above with significant medical history as below, including hiatal hernia, sleep apnea not on OSA, colon cancer, diverticulitis who presents to the ED with complaint of chest pain. Mild dib. 3 days ago patient was eating a peach, she feels as though the part of the piece got stuck in her throat.  She ate some bread afterwards and felt like that made it somewhat better.  Still had a globus sensation.  She went to [facility] ED to be evaluated, she LWCS due to prolonged wait time.  At that time concern for possible foreign body.  Patient does report she had some relief yesterday but today she woke up with chest pressure, heaviness to her chest.  Intermittent.  Not associate with dyspnea, diaphoresis, lightheadedness or syncope.  No nausea or vomiting.  Does report it feels somewhat like indigestion.  History of swallowing study in the past with hiatal hernia per pt.     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 w/prolonged cold sensitivity--Oxaliplatin related   Pneumothorax 07/19/2011   Bilateral   Renal cysts, acquired, bilateral    Sleep apnea    does not use CPAP on regular basis    Past Surgical History:  Procedure Laterality Date   ABDOMINAL HYSTERECTOMY  mid 20s   bleeding   COLON  SURGERY  05/17/11   Partial colectomy   COLONOSCOPY  09/04/2014   One diminutive polyp at the recto sigmoid colon removed. Diverticulosis of the entire examined colon. Internal hemorrhoids.    HERNIA REPAIR  09/06/12   lap incisional hernia   INSERTION OF MESH  09/06/2012   Procedure: INSERTION OF MESH;  Surgeon: Gwenyth Ober, MD;  Location: [facility];  Service: General;  Laterality: N/A;   ORIF ANKLE FRACTURE Left 12/27/2020   Procedure: OPEN REDUCTION INTERNAL FIXATION (ORIF) Left ankle trimalleolar fracture dislocation;  Surgeon: Wylene Simmer, MD;  Location: [facility];  Service: Orthopedics;  Laterality: Left;  76mn   PORT-A-CATH REMOVAL  05/06/2012   Procedure: MINOR REMOVAL PORT-A-CATH;  Surgeon: JGwenyth Ober MD;  Location: [facility]  Service: General;  Laterality: Right;   PORTACATH PLACEMENT  07/20/11   Right side placed   PORTACATH PLACEMENT     left side & right side   VENTRAL HERNIA REPAIR  09/06/2012   Procedure: LAPAROSCOPIC VENTRAL HERNIA;  Surgeon: JGwenyth Ober MD;  [place] [facility]  Service: General;  Laterality: N/A;     The history is provided by the patient. No language interpreter was used.  Foreign Body Associated symptoms: no abdominal pain, no cough, no nausea and no trouble swallowing        Home Medications Prior  to Admission medications   Medication Sig Start Date End Date Taking? Authorizing Provider  blood glucose meter kit and supplies KIT Dispense based on patient and insurance preference. Use up to four times daily as directed. 04/22/22   Marrian Salvage, FNP  sucralfate (CARAFATE) 1 g tablet Take 1 tablet (1 g total) by mouth with breakfast, with lunch, and with evening meal for 7 days. 07/08/22 07/15/22 Yes Wynona Dove A, DO  gabapentin (NEURONTIN) 300 MG capsule TAKE 1 CAPSULE BY MOUTH THREE TIMES DAILY 07/24/21   Hoyt Koch, MD  pantoprazole (PROTONIX) 40 MG tablet Take 1 tablet (40 mg total) by mouth  daily. Please call [phone] to schedule an office visit for more refills 04/03/22   Jackquline Denmark, MD  potassium chloride SA (K-DUR,KLOR-CON) 20 MEQ tablet Take 20 mEq by mouth daily. 11/25/11 01/20/12  Owens Shark, NP  prochlorperazine (COMPAZINE) 10 MG tablet Take 10 mg by mouth every 6 (six) hours as needed. For nausea/vomiting.  01/18/12  Ladell Pier, MD      Allergies    Tylenol [acetaminophen]    Review of Systems   Review of Systems  Constitutional:  Negative for activity change and fever.  HENT:  Negative for facial swelling and trouble swallowing.   Eyes:  Negative for discharge and redness.  Respiratory:  Positive for chest tightness. Negative for cough and shortness of breath.   Cardiovascular:  Positive for chest pain. Negative for palpitations.  Gastrointestinal:  Negative for abdominal pain and nausea.  Genitourinary:  Negative for dysuria and flank pain.  Musculoskeletal:  Negative for back pain and gait problem.  Skin:  Negative for pallor and rash.  Neurological:  Negative for syncope and headaches.    Physical Exam Updated Vital Signs BP (!) 159/84   Pulse 77   Temp 98.2 F (36.8 C)   Resp 18   Ht 5' 11"  (1.803 m)   Wt 119.7 kg   SpO2 99%   BMI 36.82 kg/m  Physical Exam Vitals and nursing note reviewed.  Constitutional:      General: She is not in acute distress.    Appearance: Normal appearance. She is obese. She is not ill-appearing or diaphoretic.  HENT:     Head: Normocephalic and atraumatic.     Jaw: There is normal jaw occlusion. No trismus or swelling.     Comments: No drooling stridor or trismus    Right Ear: External ear normal.     Left Ear: External ear normal.     Nose: Nose normal.     Mouth/Throat:     Mouth: Mucous membranes are moist.  Eyes:     General: No scleral icterus.       Right eye: No discharge.        Left eye: No discharge.  Neck:     Trachea: Trachea normal.     Comments: No drooling stridor or trismus No  angioedema Cardiovascular:     Rate and Rhythm: Normal rate and regular rhythm.     Pulses: Normal pulses.     Heart sounds: Normal heart sounds.  Pulmonary:     Effort: Pulmonary effort is normal. No respiratory distress.     Breath sounds: Normal breath sounds.  Abdominal:     General: Abdomen is flat.     Palpations: Abdomen is soft.     Tenderness: There is no abdominal tenderness. There is no guarding or rebound.  Musculoskeletal:  General: Normal range of motion.     Cervical back: Full passive range of motion without pain and normal range of motion.     Right lower leg: No edema.     Left lower leg: No edema.  Skin:    General: Skin is warm and dry.     Capillary Refill: Capillary refill takes less than 2 seconds.  Neurological:     Mental Status: She is alert and oriented to person, place, and time.     GCS: GCS eye subscore is 4. GCS verbal subscore is 5. GCS motor subscore is 6.  Psychiatric:        Mood and Affect: Mood normal.        Behavior: Behavior normal.     ED Results / Procedures / Treatments   Labs (all labs ordered are listed, but only abnormal results are displayed) Labs Reviewed  COMPREHENSIVE METABOLIC PANEL - Abnormal; Notable for the following components:      Result Value   Creatinine, Ser 1.20 (*)    Calcium 8.8 (*)    GFR, Estimated 52 (*)    All other components within normal limits  CBC WITH DIFFERENTIAL/PLATELET - Abnormal; Notable for the following components:   RBC 5.54 (*)    MCV 70.2 (*)    MCH 22.2 (*)    RDW 17.2 (*)    All other components within normal limits  LIPASE, BLOOD  TROPONIN I (HIGH SENSITIVITY)  TROPONIN I (HIGH SENSITIVITY)    EKG EKG Interpretation  Date/Time:  Tuesday July 08 2022 15:54:31 EDT Ventricular Rate:  74 PR Interval:  162 QRS Duration: 86 QT Interval:  385 QTC Calculation: 428 R Axis:   37 Text Interpretation: Sinus rhythm Low voltage, precordial leads Anteroseptal infarct, old  Interpretation limited secondary to artifact Confirmed by Wynona Dove ([id number]) on 07/08/2022 4:17:13 PM  Radiology DG Chest 1 View  Result Date: 07/08/2022 CLINICAL DATA:  Rule out ingested foreign body EXAM: CHEST  1 VIEW COMPARISON:  Chest 07/06/2022 FINDINGS: The heart size and mediastinal contours are within normal limits. Both lungs are clear. The visualized skeletal structures are unremarkable. IMPRESSION: No active disease. Electronically Signed   By: Franchot Gallo M.D.   On: 07/08/2022 16:35   DG Neck Soft Tissue  Result Date: 07/08/2022 CLINICAL DATA:  Question foreign body.  Swallowed peach 2 days ago EXAM: NECK SOFT TISSUES - 1+ VIEW COMPARISON:  None Available. FINDINGS: There is no evidence of retropharyngeal soft tissue swelling or epiglottic enlargement. The cervical airway is unremarkable and no radio-opaque foreign body identified. Calcification overlying the airway posterior epiglottis unchanged. This is likely hyoid calcification and not foreign body. Cervical disc degeneration and spurring C6-7. IMPRESSION: Negative for radiopaque foreign body in the airway. Electronically Signed   By: Franchot Gallo M.D.   On: 07/08/2022 16:34    Procedures Procedures    Medications Ordered in ED Medications  glucagon (human recombinant) (GLUCAGEN) injection 1 mg (1 mg Intravenous Given 07/08/22 1507)  sodium chloride 0.9 % bolus 1,000 mL (0 mLs Intravenous Stopped 07/08/22 1755)  famotidine (PEPCID) IVPB 20 mg premix (0 mg Intravenous Stopped 07/08/22 1627)  alum & mag hydroxide-simeth (MAALOX/MYLANTA) 200-200-20 MG/5ML suspension 30 mL (30 mLs Oral Given 07/08/22 1552)    And  lidocaine (XYLOCAINE) 2 % viscous mouth solution 15 mL (15 mLs Oral Given 07/08/22 1552)  diazepam (VALIUM) injection 5 mg (5 mg Intravenous Given 07/08/22 1811)    ED Course/ Medical Decision Making/ A&P  Medical Decision Making Amount and/or Complexity of Data Reviewed Labs:  ordered. Radiology: ordered.  Risk OTC drugs. Prescription drug management.   This patient presents to the ED with chief complaint(s) of globus sensation, cp with pertinent past medical history of hiatal hernia, htn, obesity which further complicates the presenting complaint. The complaint involves an extensive differential diagnosis and also carries with it a high risk of complications and morbidity.    The differential diagnosis includes but not limited to   Differential includes all life-threatening causes for chest pain. This includes but is not exclusive to acute coronary syndrome, aortic dissection, pulmonary embolism, cardiac tamponade, community-acquired pneumonia, pericarditis, musculoskeletal chest wall pain, globus, fb, indigestion etc.  In my evaluation of this patient's dyspnea my DDx includes, but is not limited to, pneumonia, pulmonary embolism, pneumothorax, pulmonary edema, metabolic acidosis, asthma, COPD, cardiac cause, anemia, anxiety, etc.   Serious etiologies were considered.   The initial plan is to screening labs/imaging/ivf/gi cocktail/po challenge/glucagon > no sig improvement with carbonated beverage and glucagon  Additional history obtained: Additional history obtained from  na Records reviewed  prior ed visits, prior labs/imaging   Independent labs interpretation:  The following labs were independently interpreted:  Trop Negative x2 CBC stable CMP with Cr similar to her baseline (actually better than her baseline), lytes are stable Lipase not elevated   Independent visualization of imaging: - I independently visualized the following imaging with scope of interpretation limited to determining acute life threatening conditions related to emergency care: CXR, neck xr, which revealed no fb. Calcification was noted over they hyoid that was previously presumed to be fb however appears more c/w calcification today rather than fb. Would agree with  radiology  Cardiac monitoring was reviewed and interpreted by myself which shows NSR  Treatment and Reassessment: IV fluids, Valium, Pepcid, GI cocktail, glucagon >> symptoms have improved.  Swallowing without difficulty.  Consultation: - Consulted or discussed management/test interpretation w/ external professional: n/a  Consideration for admission or further workup: Admission was considered   The patient's chest pain is not suggestive of pulmonary embolus, cardiac ischemia, aortic dissection, pericarditis, myocarditis, pulmonary embolism, pneumothorax, pneumonia, Zoster, or esophageal perforation, or other serious etiology.  Historically not abrupt in onset, tearing or ripping, pulses symmetric. EKG nonspecific for ischemia/infarction. No dysrhythmias, brugada, WPW, prolonged QT noted.   Troponin negative x2. CXR reviewed. Labs without demonstration of acute pathology unless otherwise noted above. Low HEART Score:   Given the extremely low risk of these diagnoses further testing and evaluation for these possibilities does not appear to be indicated at this time.   Patient is tolerant p.o. intake, no drooling stridor or trismus.  She is breathing complaint ambient air.  No vomiting.  I have low patient for food bolus or impacted food bolus.  Favor likely globus sensation. She is tolerating PO w/o difficulty. Advised her to follow-up with her gastroenterologist in the office.  RTED if worse.  Patient in no distress and overall condition improved here in the ED. Detailed discussions were had with the patient regarding current findings, and need for close f/u with PCP or on call doctor. The patient has been instructed to return immediately if the symptoms worsen in any way for re-evaluation. Patient verbalized understanding and is in agreement with current care plan. All questions answered prior to discharge.   Social Determinants of health: Social History   Tobacco Use   Smoking status:  Never   Smokeless tobacco: Never  Vaping Use   Vaping Use:  Never used  Substance Use Topics   Alcohol use: No   Drug use: No            Final Clinical Impression(s) / ED Diagnoses Final diagnoses:  Globus sensation  Atypical chest pain    Rx / DC Orders ED Discharge Orders          Ordered    Ambulatory referral to Gastroenterology        07/08/22 1908    sucralfate (CARAFATE) 1 g tablet  3 times daily with meals        07/08/22 1908              Jeanell Sparrow, DO 07/08/22 1915

## 2022-07-08 NOTE — Discharge Instructions (Addendum)
It was a pleasure caring for you today in the emergency department. ° °Please return to the emergency department for any worsening or worrisome symptoms. ° ° °

## 2022-07-10 ENCOUNTER — Encounter: Payer: Self-pay | Admitting: Family

## 2022-07-10 ENCOUNTER — Telehealth (INDEPENDENT_AMBULATORY_CARE_PROVIDER_SITE_OTHER): Payer: 59 | Admitting: Family

## 2022-07-10 VITALS — Ht 70.0 in | Wt 262.0 lb

## 2022-07-10 DIAGNOSIS — R030 Elevated blood-pressure reading, without diagnosis of hypertension: Secondary | ICD-10-CM | POA: Diagnosis not present

## 2022-07-10 NOTE — Progress Notes (Signed)
Mary [name] is a 61 y.o. female with the following history as recorded in EpicCare:  Patient Active Problem List   Diagnosis Date Noted   Left trimalleolar fracture, closed, initial encounter 12/18/2020   Left ankle pain 12/18/2020   Labial cyst 03/05/2018   Chest pain 05/30/2017   Chronic nonintractable headache 05/04/2017   Ankle edema, bilateral 05/04/2017   Cellulitis 04/04/2016   Abnormal x-ray 04/04/2016   Hearing loss due to cerumen impaction 01/14/2016   Impaired fasting blood sugar 01/14/2016   Plantar fasciitis, right 11/22/2015   Right calcaneal fracture 08/15/2015   OSA (obstructive sleep apnea) 09/26/2014   Peripheral neuropathy due to chemotherapy ([place]) 06/05/2014   Obesity 06/05/2014   history of DVT (deep venous thrombosis) 02/20/2014   GERD (gastroesophageal reflux disease) 02/20/2014   Hernia 09/22/2012   Iron deficiency anemia, unspecified 08/30/2011   Colon cancer ([place]) 08/29/2011   Cancer of descending colon ([place]) 05/12/2011    Current Outpatient Medications  Medication Sig Dispense Refill   blood glucose meter kit and supplies KIT Dispense based on patient and insurance preference. Use up to four times daily as directed. 1 each 0   gabapentin (NEURONTIN) 300 MG capsule TAKE 1 CAPSULE BY MOUTH THREE TIMES DAILY 90 capsule 0   pantoprazole (PROTONIX) 40 MG tablet Take 1 tablet (40 mg total) by mouth daily. Please call [phone] to schedule an office visit for more refills (Patient not taking: Reported on 07/10/2022) 90 tablet 1   sucralfate (CARAFATE) 1 g tablet Take 1 tablet (1 g total) by mouth with breakfast, with lunch, and with evening meal for 7 days. (Patient not taking: Reported on 07/10/2022) 21 tablet 0   Current Facility-Administered Medications  Medication Dose Route Frequency Provider Last Rate Last Admin   cyanocobalamin ((VITAMIN B-12)) injection 1,000 mcg  1,000 mcg Intramuscular Once Marrian Salvage, FNP       cyanocobalamin  ((VITAMIN B-12)) injection 1,000 mcg  1,000 mcg Intramuscular Q30 days Marrian Salvage, FNP   1,000 mcg at 05/14/22 4782    Allergies: Tylenol [acetaminophen]  Past Medical History:  Diagnosis Date   Adenocarcinoma sigmoid colon 05/12/2011   Stage III(pT2pN2b) descending sigmoid   Arm DVT (deep venous thromboembolism), acute ([place]) 07/04/2011   r/t PAC   Blood transfusion    Cancer of descending colon ([place]) 05/12/2011   Diverticulitis    GERD (gastroesophageal reflux disease)    History of migraine headaches    Hypertension    Incisional hernia    iron deficiency anemia    Multiple hemangiomas    Liver   Neuropathy due to drug ([place]) 2012   Early--assoc. w/prolonged cold sensitivity--Oxaliplatin related   Pneumothorax 07/19/2011   Bilateral   Renal cysts, acquired, bilateral    Sleep apnea    does not use CPAP on regular basis    Past Surgical History:  Procedure Laterality Date   ABDOMINAL HYSTERECTOMY  mid 20s   bleeding   COLON SURGERY  05/17/11   Partial colectomy   COLONOSCOPY  09/04/2014   One diminutive polyp at the recto sigmoid colon removed. Diverticulosis of the entire examined colon. Internal hemorrhoids.    HERNIA REPAIR  09/06/12   lap incisional hernia   INSERTION OF MESH  09/06/2012   Procedure: INSERTION OF MESH;  Surgeon: Gwenyth Ober, MD;  Location: [facility];  Service: General;  Laterality: N/A;   ORIF ANKLE FRACTURE Left 12/27/2020   Procedure: OPEN REDUCTION INTERNAL FIXATION (ORIF) Left ankle trimalleolar fracture  dislocation;  Surgeon: Wylene Simmer, MD;  Location: [facility];  Service: Orthopedics;  Laterality: Left;  78mn   PORT-A-CATH REMOVAL  05/06/2012   Procedure: MINOR REMOVAL PORT-A-CATH;  Surgeon: JGwenyth Ober MD;  Location[facility]  Service: General;  Laterality: Right;   PORTACATH PLACEMENT  07/20/11   Right side placed   PORTACATH PLACEMENT     left side & right side   VENTRAL HERNIA REPAIR  09/06/2012    Procedure: LAPAROSCOPIC VENTRAL HERNIA;  Surgeon: JGwenyth Ober MD;  Location: [facility]  Service: General;  Laterality: N/A;    Family History  Problem Relation Age of Onset   Diabetes Mother    Hypertension Mother    Cancer Father        leukemia   Cancer Maternal Uncle        lung   Cancer Maternal Uncle        colon   Colitis Maternal Uncle    Cancer Maternal Uncle        prostate   Cancer Brother        PROSTATE   Other Brother        MVA   Esophageal cancer Neg Hx    Rectal cancer Neg Hx    Stomach cancer Neg Hx     Social History   Tobacco Use   Smoking status: Never   Smokeless tobacco: Never  Substance Use Topics   Alcohol use: No    Subjective:    I connected with Mary [name] 07/10/22 at  2:20 PM EDT by a telephone call and verified that I am speaking with the correct person using two identifiers.   I discussed the limitations of evaluation and management by telemedicine and the availability of in person appointments. The patient expressed understanding and agreed to proceed. Provider in office/ patient is at home; provider and patient are only 2 people on telephone call.   Patient is concerned that her blood pressure has been elevated in the past few weeks; was seen at ER earlier this week with concern for foreign body in her throat; did see nephrologist earlier this week as well; Denies any chest pain, shortness of breath, blurred vision or headache Has not been checking her blood pressure regularly- has had 2-3 readings over the past 2-3 weeks;       Objective:  Vitals:   07/10/22 1403  Weight: 262 lb (118.8 kg)  Height: 5' 10" (1.778 m)    Lungs: Respirations unlabored;  Neurologic: Alert and oriented; speech intact; face symmetrical; moves all extremities well; CNII-XII intact without focal deficit   Assessment:  1. Elevated blood pressure reading     Plan:  Patient will start checking her blood pressure regularly for the next 7-14 days  and forward readings for review; will determine if needs medication; she is in the process of getting scheduled for weight loss surgery and she recognizes that her blood pressure would normalize with weight loss. Follow up to be determined;   No follow-ups on file.  No orders of the defined types were placed in this encounter.   Requested Prescriptions    No prescriptions requested or ordered in this encounter

## 2022-07-15 ENCOUNTER — Telehealth: Payer: Self-pay

## 2022-07-15 NOTE — Telephone Encounter (Signed)
Mary Denmark, MD  Gillermina Hu, RN Pl set her up for EGD with dilatation with me in [facility]  She was recently in the hospital with food impaction, spontaneous disimpaction  We have dilated her before  Please make sure she is taking Protonix once a day    Received note above:    Left message for pt to call back

## 2022-07-17 NOTE — Telephone Encounter (Unsigned)
Pt was notified of Dr. Lyndel Safe recommendations: Pt stated that she was in [place] at [facility] and requested a call back on Monday:

## 2022-07-21 ENCOUNTER — Other Ambulatory Visit: Payer: Self-pay

## 2022-07-21 DIAGNOSIS — R1310 Dysphagia, unspecified: Secondary | ICD-10-CM

## 2022-07-21 DIAGNOSIS — K219 Gastro-esophageal reflux disease without esophagitis: Secondary | ICD-10-CM

## 2022-07-21 MED ORDER — PANTOPRAZOLE SODIUM 40 MG PO TBEC: 40.0000 mg | DELAYED_RELEASE_TABLET | Freq: Every day | ORAL | 0 refills | Status: DC

## 2022-07-21 NOTE — Telephone Encounter (Signed)
Pt called in stating that she is scared to take the Carafate due to the pill size; pt was notified that she could make a lil slurry out of the pill and to take about 2 or 3 teaspoons of water and dissolve the pill:  Pt was notified of Dr. Lyndel Safe recommendations: Pt was scheduled for an EGD with dilation with Dr. Lyndel Safe on 09/04/2022 at 4:00 PM with Dr. Lyndel Safe in the [facility]: Pt made aware: Ambulatory GI referral placed in Epic Prep instructions were sent to pt via My Chart: Pt made aware Pt stated that she does not have any of the medication Protonix left: Pt was given 1 month refill and scheduled an office visit with Vicie Mutters PA on 08/21/2022 at 3:00 PM: Pt made aware:  Pt verbalized understanding with all questions answered.

## 2022-07-21 NOTE — Telephone Encounter (Signed)
Patient called regarding  either "Carafate" "Protonix" medication states she having problems swallowing the pills . Requesting something smaller if possible

## 2022-07-30 ENCOUNTER — Ambulatory Visit: Payer: 59

## 2022-08-05 ENCOUNTER — Telehealth: Payer: Self-pay | Admitting: Family

## 2022-08-05 ENCOUNTER — Ambulatory Visit: Payer: 59 | Admitting: Family

## 2022-08-05 NOTE — Telephone Encounter (Signed)
I have called the pt back and there was no answer. Left a message to call back and to say that most insurances are not covering it unless it is for chemotherapy treatment. She can purchase some of the patches OTC.

## 2022-08-05 NOTE — Telephone Encounter (Signed)
Patient called requesting Lidocane pain patches to help with her diagnosis of arthritis on her back. Please advise.

## 2022-08-08 ENCOUNTER — Ambulatory Visit: Payer: 59 | Admitting: Family

## 2022-08-08 ENCOUNTER — Encounter: Payer: Self-pay | Admitting: Family

## 2022-08-08 VITALS — BP 150/90 | HR 88 | Temp 98.1°F | Ht 71.0 in | Wt 264.8 lb

## 2022-08-08 DIAGNOSIS — R030 Elevated blood-pressure reading, without diagnosis of hypertension: Secondary | ICD-10-CM | POA: Diagnosis not present

## 2022-08-08 DIAGNOSIS — R7989 Other specified abnormal findings of blood chemistry: Secondary | ICD-10-CM

## 2022-08-08 DIAGNOSIS — E538 Deficiency of other specified B group vitamins: Secondary | ICD-10-CM

## 2022-08-08 MED ORDER — AMLODIPINE BESYLATE 5 MG PO TABS: 5.0000 mg | ORAL_TABLET | Freq: Every day | ORAL | 0 refills | Status: DC

## 2022-08-08 NOTE — Progress Notes (Signed)
Mary [name] is a 61 y.o. female with the following history as recorded in EpicCare:  Patient Active Problem List   Diagnosis Date Noted   Left trimalleolar fracture, closed, initial encounter 12/18/2020   Left ankle pain 12/18/2020   Labial cyst 03/05/2018   Chest pain 05/30/2017   Chronic nonintractable headache 05/04/2017   Ankle edema, bilateral 05/04/2017   Cellulitis 04/04/2016   Abnormal x-ray 04/04/2016   Hearing loss due to cerumen impaction 01/14/2016   Impaired fasting blood sugar 01/14/2016   Plantar fasciitis, right 11/22/2015   Right calcaneal fracture 08/15/2015   OSA (obstructive sleep apnea) 09/26/2014   Peripheral neuropathy due to chemotherapy ([place]) 06/05/2014   Obesity 06/05/2014   history of DVT (deep venous thrombosis) 02/20/2014   GERD (gastroesophageal reflux disease) 02/20/2014   Hernia 09/22/2012   Iron deficiency anemia, unspecified 08/30/2011   Colon cancer ([place]) 08/29/2011   Cancer of descending colon ([place]) 05/12/2011    Current Outpatient Medications  Medication Sig Dispense Refill   amLODipine (NORVASC) 5 MG tablet Take 1 tablet (5 mg total) by mouth daily. 90 tablet 0   gabapentin (NEURONTIN) 300 MG capsule TAKE 1 CAPSULE BY MOUTH THREE TIMES DAILY 90 capsule 0   pantoprazole (PROTONIX) 40 MG tablet Take 1 tablet (40 mg total) by mouth daily. Please call [phone] to schedule an office visit for more refills 30 tablet 0   blood glucose meter kit and supplies KIT Dispense based on patient and insurance preference. Use up to four times daily as directed. 1 each 0   sucralfate (CARAFATE) 1 g tablet Take 1 tablet (1 g total) by mouth with breakfast, with lunch, and with evening meal for 7 days. (Patient not taking: Reported on 07/10/2022) 21 tablet 0   Current Facility-Administered Medications  Medication Dose Route Frequency Provider Last Rate Last Admin   cyanocobalamin ((VITAMIN B-12)) injection 1,000 mcg  1,000 mcg Intramuscular Once Marrian Salvage, FNP       cyanocobalamin ((VITAMIN B-12)) injection 1,000 mcg  1,000 mcg Intramuscular Q30 days Marrian Salvage, FNP   1,000 mcg at 05/14/22 7001    Allergies: Tylenol [acetaminophen]  Past Medical History:  Diagnosis Date   Adenocarcinoma sigmoid colon 05/12/2011   Stage III(pT2pN2b) descending sigmoid   Arm DVT (deep venous thromboembolism), acute ([place]) 07/04/2011   r/t PAC   Blood transfusion    Cancer of descending colon ([place]) 05/12/2011   Diverticulitis    GERD (gastroesophageal reflux disease)    History of migraine headaches    Hypertension    Incisional hernia    iron deficiency anemia    Multiple hemangiomas    Liver   Neuropathy due to drug ([place]) 2012   Early--assoc. w/prolonged cold sensitivity--Oxaliplatin related   Pneumothorax 07/19/2011   Bilateral   Renal cysts, acquired, bilateral    Sleep apnea    does not use CPAP on regular basis    Past Surgical History:  Procedure Laterality Date   ABDOMINAL HYSTERECTOMY  mid 20s   bleeding   COLON SURGERY  05/17/11   Partial colectomy   COLONOSCOPY  09/04/2014   One diminutive polyp at the recto sigmoid colon removed. Diverticulosis of the entire examined colon. Internal hemorrhoids.    HERNIA REPAIR  09/06/12   lap incisional hernia   INSERTION OF MESH  09/06/2012   Procedure: INSERTION OF MESH;  Surgeon: Gwenyth Ober, MD;  Location: [facility];  Service: General;  Laterality: N/A;   ORIF ANKLE FRACTURE Left  12/27/2020   Procedure: OPEN REDUCTION INTERNAL FIXATION (ORIF) Left ankle trimalleolar fracture dislocation;  Surgeon: Wylene Simmer, MD;  Location: [facility];  Service: Orthopedics;  Laterality: Left;  8mn   PORT-A-CATH REMOVAL  05/06/2012   Procedure: MINOR REMOVAL PORT-A-CATH;  Surgeon: JGwenyth Ober MD;  Location[facility]  Service: General;  Laterality: Right;   PORTACATH PLACEMENT  07/20/11   Right side placed   PORTACATH PLACEMENT     left side & right  side   VENTRAL HERNIA REPAIR  09/06/2012   Procedure: LAPAROSCOPIC VENTRAL HERNIA;  Surgeon: JGwenyth Ober MD;  [facility]  Service: General;  Laterality: N/A;    Family History  Problem Relation Age of Onset   Diabetes Mother    Hypertension Mother    Cancer Father        leukemia   Cancer Maternal Uncle        lung   Cancer Maternal Uncle        colon   Colitis Maternal Uncle    Cancer Maternal Uncle        prostate   Cancer Brother        PROSTATE   Other Brother        MVA   Esophageal cancer Neg Hx    Rectal cancer Neg Hx    Stomach cancer Neg Hx     Social History   Tobacco Use   Smoking status: Never   Smokeless tobacco: Never  Substance Use Topics   Alcohol use: No    Subjective:   Patient is concerned about elevated blood pressure readings; patient has been monitoring at home- 140-150/ 90; is scheduled for weight loss surgery in November but would like to use short term medication for blood pressure until surgery is completed;  Has never taken blood pressure medication but has been having increased headaches;     Objective:  Vitals:   08/08/22 1447  BP: (!) 150/90  Pulse: 88  Temp: 98.1 F (36.7 C)  TempSrc: Oral  SpO2: 99%  Weight: 264 lb 12.8 oz (120.1 kg)  Height: 5' 11"  (1.803 m)    General: Well developed, well nourished, in no acute distress  Skin : Warm and dry.  Head: Normocephalic and atraumatic  Eyes: Sclera and conjunctiva clear; pupils round and reactive to light; extraocular movements intact  Lungs: Respirations unlabored; clear to auscultation bilaterally without wheeze, rales, rhonchi  CVS exam: normal rate and regular rhythm.  Neurologic: Alert and oriented; speech intact; face symmetrical; moves all extremities well; CNII-XII intact without focal deficit   Assessment:  1. Elevated blood pressure reading   2. Low vitamin B12 level     Plan:  Will start Amlodipine 5 mg daily; she understands this will most likely be  stopped after her upcoming weight loss surgery; she will continue to monitor her blood pressure;  Monthly B12 injection given today;   No follow-ups on file.  No orders of the defined types were placed in this encounter.   Requested Prescriptions   Signed Prescriptions Disp Refills   amLODipine (NORVASC) 5 MG tablet 90 tablet 0    Sig: Take 1 tablet (5 mg total) by mouth daily.

## 2022-08-20 NOTE — Progress Notes (Deleted)
    08/20/2022 Mary [name] [id number] 1961-08-12  Referring provider: Marrian Salvage,* Primary GI doctor: Dr. Lyndel Safe  ASSESSMENT AND PLAN:   Assessment: 61 y.o. female here for assessment of the following: No diagnosis found.  Plan:  No orders of the defined types were placed in this encounter.   No orders of the defined types were placed in this encounter.   History of Present Illness:  61 y.o. female  with a past medical history of history of DVT, OSA, reflux, history of colon cancer stage IIIb status post partial colectomy 05/2011 with adjuvant FOLFOX chemotherapy, peripheral neuropathy secondary chemotherapy, obesity and others listed below, returns to clinic today for evaluation of ***. 08/11/2019 EGD mild Schatzki ring status post dilatation, 2 cm hiatal hernia, mild gastritis negative H. pylori 08/11/2019 colonoscopy 10 mm polyp hepatic flexure diverticula sigmoid and descending colon, small internal hemorrhoids, and in colocolonic anastomosis at 30 cm consistent with previous sigmoid resection patent healthy-appearing.  TI normal.  Recall 3 years 12/26/2021 upper GI preop for lap band showed small hiatal hernia, mild intermittent esophageal dysmotility with tertiary contractions otherwise unremarkable ***  She  reports that she has never smoked. She has never used smokeless tobacco. She reports that she does not drink alcohol and does not use drugs. Her family history includes Cancer in her brother, father, maternal uncle, maternal uncle, and maternal uncle; Colitis in her maternal uncle; Diabetes in her mother; Hypertension in her mother; Other in her brother.   Current Medications:     Current Outpatient Medications (Cardiovascular):    amLODipine (NORVASC) 5 MG tablet, Take 1 tablet (5 mg total) by mouth daily.        Current Facility-Administered Medications (Hematological):    cyanocobalamin ((VITAMIN B-12)) injection 1,000 mcg   cyanocobalamin  ((VITAMIN B-12)) injection 1,000 mcg  Current Outpatient Medications (Other):    blood glucose meter kit and supplies KIT, Dispense based on patient and insurance preference. Use up to four times daily as directed.   gabapentin (NEURONTIN) 300 MG capsule, TAKE 1 CAPSULE BY MOUTH THREE TIMES DAILY   pantoprazole (PROTONIX) 40 MG tablet, Take 1 tablet (40 mg total) by mouth daily. Please call [phone] to schedule an office visit for more refills   sucralfate (CARAFATE) 1 g tablet, Take 1 tablet (1 g total) by mouth with breakfast, with lunch, and with evening meal for 7 days. (Patient not taking: Reported on 07/10/2022)   Surgical History:  She  has a past surgical history that includes Colon surgery (05/17/11); Portacath placement (07/20/11); Portacath placement; Port-a-cath removal (05/06/2012); Ventral hernia repair (09/06/2012); Insertion of mesh (09/06/2012); Hernia repair (09/06/12); Abdominal hysterectomy (mid 31s); Colonoscopy (09/04/2014); and ORIF ankle fracture (Left, 12/27/2020).  Current Medications, Allergies, Past Medical History, Past Surgical History, Family History and Social History were reviewed in [employer] record.  Physical Exam: There were no vitals taken for this visit. General:   Pleasant, well developed female in no acute distress Heart : Regular rate and rhythm; no murmurs Pulm: Clear anteriorly; no wheezing Abdomen:  {BlankSingle:19197::"Distended","Ridged","Soft"}, {BlankSingle:19197::"Flat","Obese","Non-distended"} AB, {BlankSingle:19197::"Absent","Hyperactive, tinkling","Hypoactive","Sluggish","Active"} bowel sounds. {actendernessAB:27319} tenderness {anatomy; site abdomen:5010}. {BlankMultiple:19196::"Without guarding","With guarding","Without rebound","With rebound"}, No organomegaly appreciated. Rectal: {acrectalexam:27461} Extremities:  {With/without:5700}  edema. Neurologic:  Alert and  oriented x4;  No focal deficits.  Psych:   Cooperative. Normal mood and affect.   Vladimir Crofts, PA-C 08/20/22

## 2022-08-21 ENCOUNTER — Ambulatory Visit: Payer: 59 | Admitting: Physician Assistant

## 2022-08-28 ENCOUNTER — Encounter: Payer: Self-pay | Admitting: Gastroenterology

## 2022-09-01 ENCOUNTER — Other Ambulatory Visit: Payer: Self-pay | Admitting: Family

## 2022-09-01 DIAGNOSIS — Z1231 Encounter for screening mammogram for malignant neoplasm of breast: Secondary | ICD-10-CM

## 2022-09-04 ENCOUNTER — Other Ambulatory Visit: Payer: Self-pay

## 2022-09-04 ENCOUNTER — Telehealth: Payer: Self-pay

## 2022-09-04 ENCOUNTER — Ambulatory Visit (AMBULATORY_SURGERY_CENTER): Payer: 59 | Admitting: Gastroenterology

## 2022-09-04 ENCOUNTER — Other Ambulatory Visit: Payer: Self-pay | Admitting: Family

## 2022-09-04 ENCOUNTER — Encounter: Payer: Self-pay | Admitting: Gastroenterology

## 2022-09-04 VITALS — BP 147/78 | HR 65 | Temp 98.0°F | Resp 18 | Ht 71.0 in | Wt 263.0 lb

## 2022-09-04 DIAGNOSIS — K449 Diaphragmatic hernia without obstruction or gangrene: Secondary | ICD-10-CM | POA: Diagnosis not present

## 2022-09-04 DIAGNOSIS — K2950 Unspecified chronic gastritis without bleeding: Secondary | ICD-10-CM | POA: Diagnosis not present

## 2022-09-04 DIAGNOSIS — Z85038 Personal history of other malignant neoplasm of large intestine: Secondary | ICD-10-CM

## 2022-09-04 DIAGNOSIS — R1310 Dysphagia, unspecified: Secondary | ICD-10-CM

## 2022-09-04 DIAGNOSIS — K229 Disease of esophagus, unspecified: Secondary | ICD-10-CM | POA: Diagnosis not present

## 2022-09-04 DIAGNOSIS — K219 Gastro-esophageal reflux disease without esophagitis: Secondary | ICD-10-CM | POA: Diagnosis not present

## 2022-09-04 DIAGNOSIS — K222 Esophageal obstruction: Secondary | ICD-10-CM

## 2022-09-04 MED ORDER — SODIUM CHLORIDE 0.9 % IV SOLN: 500.0000 mL | Freq: Once | INTRAVENOUS | Status: DC

## 2022-09-04 MED ORDER — PANTOPRAZOLE SODIUM 20 MG PO TBEC: 40.0000 mg | DELAYED_RELEASE_TABLET | Freq: Every day | ORAL | 4 refills | Status: DC

## 2022-09-04 MED ORDER — NA SULFATE-K SULFATE-MG SULF 17.5-3.13-1.6 GM/177ML PO SOLN: ORAL | 0 refills | Status: DC

## 2022-09-04 NOTE — Progress Notes (Signed)
Sent message, via epic in basket, requesting orders in epic from surgeon.  

## 2022-09-04 NOTE — Op Note (Signed)
[facility] Patient Name: Mary [name] Procedure Date: 09/04/2022 1:16 PM MRN: [id number] Endoscopist: Jackquline Denmark , MD, [id number] Age: 60 Referring MD:  Date of Birth: Sep 27, 1961 Gender: Female Account #: [id number] Procedure:                Upper GI endoscopy Indications:              Dysphagia Medicines:                Monitored Anesthesia Care Procedure:                Pre-Anesthesia Assessment:                           - Prior to the procedure, a History and Physical                            was performed, and patient medications and                            allergies were reviewed. The patient's tolerance of                            previous anesthesia was also reviewed. The risks                            and benefits of the procedure and the sedation                            options and risks were discussed with the patient.                            All questions were answered, and informed consent                            was obtained. Prior Anticoagulants: The patient has                            taken no anticoagulant or antiplatelet agents. ASA                            Grade Assessment: II - A patient with mild systemic                            disease. After reviewing the risks and benefits,                            the patient was deemed in satisfactory condition to                            undergo the procedure.                           After obtaining informed consent, the endoscope was  passed under direct vision. Throughout the                            procedure, the patient's blood pressure, pulse, and                            oxygen saturations were monitored continuously. The                            Endoscope was introduced through the mouth, and                            advanced to the second part of duodenum. The upper                            GI endoscopy was accomplished without  difficulty.                            The patient tolerated the procedure well. Scope In: Scope Out: Findings:                 The distal esophagus was mildly tortuous. Biopsies                            were obtained from the proximal and distal                            esophagus with cold forceps for histology to r/o                            eosinophilic esophagitis.                           A non-obstructing and mild Schatzki ring was found                            at the gastroesophageal junction, 36 cm from the                            incisors with luminal diameter approximately 14 mm.                            The scope was withdrawn. Dilation was performed                            with a Maloney dilator with mild resistance at 33                            Fr and 54 Fr.                           A 2 cm hiatal hernia was present.                           The  entire examined stomach was otherwisenormal.                            Biopsies were taken with a cold forceps for                            histology.                           The examined duodenum was normal. Complications:            No immediate complications. Estimated Blood Loss:     Estimated blood loss: none. Impression:               - Schatzki ring. Dilated.                           - Small hiatal hernia. Recommendation:           - Patient has a contact number available for                            emergencies. The signs and symptoms of potential                            delayed complications were discussed with the                            patient. Return to normal activities tomorrow.                            Written discharge instructions were provided to the                            patient.                           - Post dilatation diet.                           - Continue present medications including Protonix                            40 mg p.o. daily #90, 4 RF.                            - Await pathology results.                           - The findings and recommendations were discussed                            with the patient's family. I do recommend                            colonoscopy for H/O colon CA. She will let us know  when she decides to get it performed. Jackquline Denmark, MD 09/04/2022 2:04:29 PM This report has been signed electronically.

## 2022-09-04 NOTE — Progress Notes (Signed)
Called to room to assist during endoscopic procedure.  Patient ID and intended procedure confirmed with present staff. Received instructions for my participation in the procedure from the performing physician.  

## 2022-09-04 NOTE — Patient Instructions (Addendum)
Nothing by mouth until 3 pm, then clear liquids for 1 hour.  If tolerated you may have a soft diet for the rest of today.  A regular diet tomorrow, and cut off or back on the hot sauce.  Use salt water gargles and or chloraseptic spray for throat pain today per Dr Lyndel Safe. Call if pain worsens and you can't drink or eat. Take your new medication (protonix take 2 every morning.1/2 hour before breakfast. Read all information given to you by your recovery room nurse.  YOU HAD AN ENDOSCOPIC PROCEDURE TODAY AT [facility]:   Refer to the procedure report that was given to you for any specific questions about what was found during the examination.  If the procedure report does not answer your questions, please call your gastroenterologist to clarify.  If you requested that your care partner not be given the details of your procedure findings, then the procedure report has been included in a sealed envelope for you to review at your convenience later.  YOU SHOULD EXPECT: Some feelings of bloating in the abdomen. Passage of more gas than usual.  Walking can help get rid of the air that was put into your GI tract during the procedure and reduce the bloating.   Please Note:  You might notice some irritation and congestion in your nose or some drainage.  This is from the oxygen used during your procedure.  There is no need for concern and it should clear up in a day or so.  SYMPTOMS TO REPORT IMMEDIATELY:   Following upper endoscopy (EGD)  Vomiting of blood or coffee ground material  New chest pain or pain under the shoulder blades  Painful or persistently difficult swallowing  New shortness of breath  Fever of 100F or higher  Black, tarry-looking stools  For urgent or emergent issues, a gastroenterologist can be reached at any hour by calling [phone]. Do not use MyChart messaging for urgent concerns.    DIET:  See the top of this sheet for diet instructions.  Drink plenty of  fluids but you should avoid alcoholic beverages.  ACTIVITY:  You should plan to take it easy for the rest of today and you should NOT DRIVE or use heavy machinery until tomorrow (because of the sedation medicines used during the test).    FOLLOW UP: Our staff will call the number listed on your records the next business day following your procedure.  We will call around 7:15- 8:00 am to check on you and address any questions or concerns that you may have regarding the information given to you following your procedure. If we do not reach you, we will leave a message.     If any biopsies were taken you will be contacted by phone or by letter within the next 1-3 weeks.  Please call us at [phone] if you have not heard about the biopsies in 3 weeks.    SIGNATURES/CONFIDENTIALITY: You and/or your care partner have signed paperwork which will be entered into your electronic medical record.  These signatures attest to the fact that that the information above on your After Visit Summary has been reviewed and is understood.  Full responsibility of the confidentiality of this discharge information lies with you and/or your care-partner.

## 2022-09-04 NOTE — Progress Notes (Signed)
Vss nad trans to pacu °

## 2022-09-04 NOTE — Telephone Encounter (Signed)
Pt was made aware of Dr Lyndel Safe recommendations: Pt scheduled for an Colonoscopy on 09/17/2022 at 4:00 PM with Dr. Lyndel Safe in the [facility]: Pt made aware Prep sent to pharmacy: Pt made aware: Prep instructions were sent to pt via my chart: Pt made aware Ambulatory GI referral placed in Epic Pt verbalized understanding with all questions answered.

## 2022-09-04 NOTE — Progress Notes (Signed)
Chief Complaint: Dysphagia  Referring Provider:  Marrian Salvage,*      ASSESSMENT AND PLAN;   #1. LLQ and peri-umblical abdo pain.  #2.  H/O Colon Ca s/p sig resection followed by neoadjuvant chemo. (Stage III) 05/2011. Last colon 08/2019 as below- neg. CEA 06/2017: 2.3 (Nl)  Plan: - CBC, CMP, CEA - CT Abdo/pelvis. Non contrast CT abdo/pelvis followed CECT. Need to r/o any kidney stones and recurrence of colon ca - Protonix 31m po QD, 30, 11 refills. -If still with problems, trial of bentyl. -Increase water intake    HPI:    Mary FELDTis a 61y.o. female   periumblical pain x 1 day  without diarrhea or constipation.  This was associated with abdominal bloating and subjective fevers.  No chills.  She describes pain is more like cramps-severe enough that she went to urgent care.  Her urine apparently was positive for RBCs.  We do not have those records.  She was told to see urology if she continues to have positive UA.  She will get in touch with Dr. CSharlet Salinaregarding the same.  No significant nausea or vomiting.  She denies having any further problems with dysphagia.  Past GI procedures: EGD 08/2019 - Mild Schatzki ring s/p dilatation - 2 cm hiatal hernia. - Mild gastritis  Colon 08/2019 -Colon polyp S/P polypectomy. Bx- TA. Rpt 3 yrs -Left colonic diverticulosis. -Otherwise normal postoperative colonoscopy.  Ba swallow 07/06/2019- small HH, mild eso dysmotility.  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 w/prolonged cold sensitivity--Oxaliplatin related   Pneumothorax 07/19/2011   Bilateral   Renal cysts, acquired, bilateral    Sleep apnea    does not use CPAP on regular basis    Past Surgical History:  Procedure Laterality Date   ABDOMINAL HYSTERECTOMY  mid 20s   bleeding   COLON SURGERY  05/17/11   Partial colectomy   COLONOSCOPY  09/04/2014   One diminutive polyp at the recto sigmoid colon removed. Diverticulosis of the entire examined colon. Internal hemorrhoids.    HERNIA REPAIR  09/06/12   lap incisional hernia   INSERTION OF MESH  09/06/2012   Procedure: INSERTION OF MESH;  Surgeon: JGwenyth Ober MD;  [facility]  Service: General;  Laterality: N/A;   ORIF ANKLE FRACTURE Left 12/27/2020   Procedure: OPEN REDUCTION INTERNAL FIXATION (ORIF) Left ankle trimalleolar fracture dislocation;  Surgeon: HWylene Simmer MD;  [facility]  Service: Orthopedics;  Laterality: Left;  750m   PORT-A-CATH REMOVAL  05/06/2012   Procedure: MINOR REMOVAL PORT-A-CATH;  Surgeon: JaGwenyth OberMD;  [facility] Service: General;  Laterality: Right;   PORTACATH PLACEMENT  07/20/11   Right side placed   PORTACATH PLACEMENT     left side & right side   VENTRAL HERNIA REPAIR  09/06/2012   Procedure: LAPAROSCOPIC VENTRAL HERNIA;  Surgeon: Gwenyth Ober, MD;  Location[facility];  Service: General;  Laterality: N/A;    Family History  Problem Relation Age of Onset   Diabetes Mother    Hypertension Mother    Cancer Father        leukemia   Cancer Maternal Uncle        lung   Cancer Maternal Uncle        colon   Colitis Maternal Uncle    Cancer Maternal Uncle        prostate   Cancer Brother        PROSTATE   Other Brother        MVA   Esophageal cancer Neg Hx    Rectal cancer Neg Hx    Stomach cancer Neg Hx     Social  History   Tobacco Use   Smoking status: Never   Smokeless tobacco: Never  Vaping Use   Vaping Use: Never used  Substance Use Topics   Alcohol use: No   Drug use: No    Current Outpatient Medications  Medication Sig Dispense Refill   ACCU-CHEK GUIDE test strip USE   TO CHECK GLUCOSE UP TO 4 TIMES DAILY 100 each 0   Accu-Chek Softclix Lancets lancets USE   TO CHECK GLUCOSE UP TO 4 TIMES DAILY 100 each 0   amLODipine (NORVASC) 5 MG tablet Take 1 tablet (5 mg total) by mouth daily. 90 tablet 0   blood glucose meter kit and supplies KIT Dispense based on patient and insurance preference. Use up to four times daily as directed. 1 each 0   gabapentin (NEURONTIN) 300 MG capsule TAKE 1 CAPSULE BY MOUTH THREE TIMES DAILY 90 capsule 0   Iron-Vitamin C (IRON 100/C) 100-250 MG TABS Iron     pantoprazole (PROTONIX) 40 MG tablet Take 1 tablet (40 mg total) by mouth daily. Please call [phone] to schedule an office visit for more refills 30 tablet 0   sucralfate (CARAFATE) 1 g tablet Take 1 tablet (1 g total) by mouth with breakfast, with lunch, and with evening meal for 7 days. (Patient not taking: Reported on 07/10/2022) 21 tablet 0   Current Facility-Administered Medications  Medication Dose Route Frequency Provider Last Rate Last Admin   0.9 %  sodium chloride infusion  500 mL Intravenous Once Jackquline Denmark, MD       cyanocobalamin ((VITAMIN B-12)) injection 1,000 mcg  1,000 mcg Intramuscular Once Marrian Salvage, FNP       cyanocobalamin ((VITAMIN B-12)) injection 1,000 mcg  1,000 mcg Intramuscular Q30 days Marrian Salvage, FNP   1,000 mcg at 05/14/22 1509    Allergies  Allergen Reactions   Tylenol [Acetaminophen]     Review of Systems:  Constitutional: Denies fever, chills, diaphoresis, appetite change and fatigue.  HEENT: Denies photophobia, eye pain, redness, hearing loss, ear pain, congestion, sore throat, rhinorrhea, sneezing, mouth sores, neck pain, neck stiffness and  tinnitus.   Respiratory: Denies SOB, DOE, cough, chest tightness,  and wheezing.   Cardiovascular: Denies chest pain, palpitations and leg swelling.  Genitourinary: Denies dysuria, urgency, frequency, hematuria, flank pain and difficulty urinating.  Musculoskeletal: Denies myalgias, back pain, joint swelling, arthralgias and gait problem.  Skin: No rash.  Neurological: Denies dizziness, seizures, syncope, weakness, light-headedness, numbness and headaches.  Hematological: Denies adenopathy. Easy bruising, personal or family bleeding history  Psychiatric/Behavioral: No anxiety or depression     Physical Exam:  BP (!) 160/67   Pulse (!) 54   Temp 98 F (36.7 C)   Ht 5' 11"  (1.803 m)   Wt 263 lb (119.3 kg)   SpO2 97%   BMI 36.68 kg/m  Filed Weights   09/04/22 1318  Weight: 263 lb (119.3 kg)   Constitutional:  Well-developed, in no acute distress. Psychiatric: Normal mood and affect. Behavior is normal. HEENT: Pupils normal.  Conjunctivae are normal. No scleral icterus. Neck supple.  Cardiovascular: Normal rate, regular rhythm. No edema Pulmonary/chest: Effort normal and breath sounds normal. No wheezing, rales or rhonchi. Abdominal: Soft, nondistended. LLQ tenderness. Bowel sounds active throughout. There are no masses palpable. No hepatomegaly. Rectal:  defered Neurological: Alert and oriented to person place and time. Skin: Skin is warm and dry. No rashes noted.  Data Reviewed: I have personally reviewed following labs and imaging studies  CBC:    Latest Ref Rng & Units 07/08/2022    4:40 PM 07/06/2022    5:00 PM 03/25/2022    1:50 PM  CBC  WBC 4.0 - 10.5 K/uL 9.2  7.8  7.3   Hemoglobin 12.0 - 15.0 g/dL 12.3  12.8  13.9   Hematocrit 36.0 - 46.0 % 38.9  40.6  45.1   Platelets 150 - 400 K/uL 190  177  222.0     CMP:    Latest Ref Rng & Units 07/08/2022    4:40 PM 07/06/2022    5:00 PM 04/01/2022   10:00 AM  CMP  Glucose 70 - 99 mg/dL 88  96  170   BUN 8 - 23  mg/dL 13  15  17    Creatinine 0.44 - 1.00 mg/dL 1.20  1.47  1.56   Sodium 135 - 145 mmol/L 138  137  139   Potassium 3.5 - 5.1 mmol/L 3.6  3.9  4.3   Chloride 98 - 111 mmol/L 107  107  104   CO2 22 - 32 mmol/L 26  24  30    Calcium 8.9 - 10.3 mg/dL 8.8  9.0  8.9   Total Protein 6.5 - 8.1 g/dL 6.6   5.3   Total Bilirubin 0.3 - 1.2 mg/dL 0.5   0.3   Alkaline Phos 38 - 126 U/L 75   75   AST 15 - 41 U/L 20   13   ALT 0 - 44 U/L 19   15      Radiology Studies: No results found.    Carmell Austria, MD 09/04/2022, 1:35 PM  Cc: Marrian Salvage,*

## 2022-09-04 NOTE — Telephone Encounter (Signed)
-----   Message from Jackquline Denmark, MD sent at 09/04/2022  2:08 PM EDT ----- Regarding: Needs colon for H/O Colon Ca Had EGD done today Needs colon  RG

## 2022-09-05 ENCOUNTER — Telehealth: Payer: Self-pay | Admitting: *Deleted

## 2022-09-05 NOTE — Telephone Encounter (Signed)
  Follow up Call-     09/04/2022    1:19 PM  Call back number  Post procedure Call Back phone  # [phone]  Permission to leave phone message Yes     Patient questions:  Do you have a fever, pain , or abdominal swelling? No. Pain Score  0 *  Have you tolerated food without any problems? Yes.    Have you been able to return to your normal activities? Yes.    Do you have any questions about your discharge instructions: Diet   No. Medications  No. Follow up visit  No.  Do you have questions or concerns about your Care? No.  Actions: * If pain score is 4 or above: No action needed, pain <4.

## 2022-09-08 ENCOUNTER — Encounter: Payer: Self-pay | Admitting: Dietician

## 2022-09-08 ENCOUNTER — Encounter: Payer: 59 | Attending: Surgery | Admitting: Dietician

## 2022-09-08 DIAGNOSIS — Z713 Dietary counseling and surveillance: Secondary | ICD-10-CM | POA: Insufficient documentation

## 2022-09-08 DIAGNOSIS — K219 Gastro-esophageal reflux disease without esophagitis: Secondary | ICD-10-CM | POA: Insufficient documentation

## 2022-09-08 DIAGNOSIS — N189 Chronic kidney disease, unspecified: Secondary | ICD-10-CM | POA: Diagnosis not present

## 2022-09-08 DIAGNOSIS — D649 Anemia, unspecified: Secondary | ICD-10-CM | POA: Insufficient documentation

## 2022-09-08 DIAGNOSIS — Z6836 Body mass index (BMI) 36.0-36.9, adult: Secondary | ICD-10-CM | POA: Diagnosis not present

## 2022-09-08 DIAGNOSIS — E669 Obesity, unspecified: Secondary | ICD-10-CM

## 2022-09-08 DIAGNOSIS — C785 Secondary malignant neoplasm of large intestine and rectum: Secondary | ICD-10-CM | POA: Insufficient documentation

## 2022-09-08 DIAGNOSIS — F419 Anxiety disorder, unspecified: Secondary | ICD-10-CM | POA: Diagnosis not present

## 2022-09-08 DIAGNOSIS — G4730 Sleep apnea, unspecified: Secondary | ICD-10-CM | POA: Insufficient documentation

## 2022-09-08 DIAGNOSIS — E119 Type 2 diabetes mellitus without complications: Secondary | ICD-10-CM | POA: Insufficient documentation

## 2022-09-08 NOTE — Patient Instructions (Signed)
DUE TO COVID-19 ONLY TWO VISITORS  (aged 61 and older)  ARE ALLOWED TO COME WITH YOU AND STAY IN THE WAITING ROOM ONLY DURING PRE OP AND PROCEDURE.   **NO VISITORS ARE ALLOWED IN THE SHORT STAY AREA OR RECOVERY ROOM!!**  IF YOU WILL BE ADMITTED INTO THE HOSPITAL YOU ARE ALLOWED ONLY FOUR SUPPORT PEOPLE DURING VISITATION HOURS ONLY (7 AM -8PM)   The support person(s) must pass our screening, gel in and out, and wear a mask at all times, including in the patient's room. Patients must also wear a mask when staff or their support person are in the room. Visitors GUEST BADGE MUST BE WORN VISIBLY  One adult visitor may remain with you overnight and MUST be in the room by 8 P.M.     Your procedure is scheduled on: 09/22/22   Report to [facility] Main Entrance    Report to admitting at 5:15 AM   Call this number if you have problems the morning of surgery [phone]  NO SOLID FOOD AFTER 6:00 PM THE NIGHT BEFORE YOUR SURGERY.   YOU MAY DRINK CLEAR FLUIDS THE MORNING OF SURGERY UNTIL:4:30 AM   THE G2 DRINK YOU DRINK BEFORE YOU LEAVE HOME WILL BE THE LAST FLUIDS YOU DRINK BEFORE SURGERY THEN NOTHING MORE BY MOUTH.   PAIN IS EXPECTED AFTER SURGERY AND WILL NOT BE COMPLETELY ELIMINATED.   AMBULATION AND TYLENOL WILL HELP REDUCE INCISIONAL AND GAS PAIN. MOVEMENT IS KEY!  YOU ARE EXPECTED TO BE OUT OF BED WITHIN 4 HOURS OF ADMISSION TO YOUR PATIENT ROOM.  SITTING IN THE RECLINER THROUGHOUT THE DAY IS IMPORTANT FOR DRINKING FLUIDS AND MOVING GAS THROUGHOUT THE GI TRACT.  COMPRESSION STOCKINGS SHOULD BE WORN [place] UNLESS YOU ARE WALKING.   INCENTIVE SPIROMETER SHOULD BE USED EVERY HOUR WHILE AWAKE TO DECREASE POST-OPERATIVE COMPLICATIONS SUCH AS PNEUMONIA.  WHEN DISCHARGED HOME, IT IS IMPORTANT TO CONTINUE TO WALK EVERY HOUR AND USE THE INCENTIVE SPIROMETER EVERY HOUR.   Clear Liquid Diet  Water Black Coffee (sugar ok, NO MILK/CREAM OR CREAMERS)  Tea  (sugar ok, NO MILK/CREAM OR CREAMERS) regular and decaf                             Plain Jell-O (NO RED)                                           Fruit ices (not with fruit pulp, NO RED)                                     Popsicles (NO RED)                                                                  Juice: apple, WHITE grape, WHITE cranberry Sports drinks like Gatorade (NO RED)             .        The day of surgery:  Drink ONE  G2 at 4:15 AM  the morning of surgery. Drink in one sitting. Do not sip.  This drink was given to you during your hospital  pre-op appointment visit. Nothing else to drink after completing the   G2. At 4:30 AM          If you have questions, please contact your surgeon's office.      Oral Hygiene is also important to reduce your risk of infection.                                    Remember - BRUSH YOUR TEETH THE MORNING OF SURGERY WITH YOUR REGULAR TOOTHPASTE   Do NOT smoke after Midnight   Take these medicines the morning of surgery with A SIP OF WATER: Pain med if needed                                                                                                                            Gabapentin-Neurontin                                                                                                                            Amlodipine-Norvasc                                                                                                                            Pantoprazole-Protonix  Bring CPAP mask and tubing day of surgery.                              You may not have any metal on your body including hair pins, jewelry, and body piercing             Do not wear make-up, lotions, powders, perfumes/cologne, or deodorant  Do not wear nail polish including gel  and S&S, artificial/acrylic nails, or any other type of covering on natural nails including finger and toenails. If you have artificial nails, gel coating, etc. that needs to be  removed by a nail salon please have this removed prior to surgery or surgery may need to be canceled/ delayed if the surgeon/ anesthesia feels like they are unable to be safely monitored.   Do not shave  48 hours prior to surgery.     Do not bring valuables to the hospital. [place].   Contacts, dentures or bridgework may not be worn into surgery.   Bring small overnight bag day of surgery.   DO NOT [place]. PHARMACY WILL DISPENSE MEDICATIONS LISTED ON YOUR MEDICATION LIST TO YOU DURING YOUR ADMISSION [place]!      Special Instructions: Bring a copy of your healthcare power of attorney and living will documents  the day of surgery if you haven't scanned them before.              Please read over the following fact sheets you were given: IF YOU HAVE QUESTIONS ABOUT YOUR PRE-OP INSTRUCTIONS PLEASE CALL [phone]     [facility] - Preparing for Surgery Before surgery, you can play an important role.  Because skin is not sterile, your skin needs to be as free of germs as possible.  You can reduce the number of germs on your skin by washing with CHG (chlorahexidine gluconate) soap before surgery.  CHG is an antiseptic cleaner which kills germs and bonds with the skin to continue killing germs even after washing. Please DO NOT use if you have an allergy to CHG or antibacterial soaps.  If your skin becomes reddened/irritated stop using the CHG and inform your nurse when you arrive at Short Stay. Do not shave (including legs and underarms) for at least 48 hours prior to the first CHG shower.   Please follow these instructions carefully:  1.  Shower with CHG Soap the night before surgery and the  morning of Surgery.  2.  If you choose to wash your hair, wash your hair first as usual with your  normal  shampoo.  3.  After you shampoo, rinse your hair and body thoroughly to remove the  shampoo.                             4.  Use CHG as you would any other liquid soap.  You can apply chg directly  to the skin and wash                       Gently with a scrungie or clean washcloth.  5.  Apply the CHG Soap to your body ONLY FROM THE NECK DOWN.   Do not use on face/ open                           Wound or open sores. Avoid contact with eyes, ears mouth and genitals (private parts).                       Wash face,  Genitals (private parts) with your normal soap.             6.  Wash thoroughly, paying special attention to the area where your surgery  will be performed.  7.  Thoroughly rinse your body with warm water from the neck down.  8.  DO NOT shower/wash with your normal soap after using and rinsing off  the CHG Soap.                9.  Pat yourself dry with a clean towel.            10.  Wear clean pajamas.            11.  Place clean sheets on your bed the night of your first shower and do not  sleep with pets. Day of Surgery : Do not apply any lotions/deodorants the morning of surgery.  Please wear clean clothes to the hospital/surgery center.  FAILURE TO FOLLOW THESE INSTRUCTIONS MAY RESULT IN THE CANCELLATION OF YOUR SURGERY    ________________________________________________________________________   Incentive Spirometer  An incentive spirometer is a tool that can help keep your lungs clear and active. This tool measures how well you are filling your lungs with each breath. Taking long deep breaths may help reverse or decrease the chance of developing breathing (pulmonary) problems (especially infection) following: A long period of time when you are unable to move or be active. BEFORE THE PROCEDURE  If the spirometer includes an indicator to show your best effort, your nurse or respiratory therapist will set it to a desired goal. If possible, sit up straight or lean slightly forward. Try not to slouch. Hold the incentive spirometer in an upright position. INSTRUCTIONS FOR USE   Sit on the edge of your bed if possible, or sit up as far as you can in bed or on a chair. Hold the incentive spirometer in an upright position. Breathe out normally. Place the mouthpiece in your mouth and seal your lips tightly around it. Breathe in slowly and as deeply as possible, raising the piston or the ball toward the top of the column. Hold your breath for 3-5 seconds or for as long as possible. Allow the piston or ball to fall to the bottom of the column. Remove the mouthpiece from your mouth and breathe out normally. Rest for a few seconds and repeat Steps 1 through 7 at least 10 times every 1-2 hours when you are awake. Take your time and take a few normal breaths between deep breaths. The spirometer may include an indicator to show your best effort. Use the indicator as a goal to work toward during each repetition. After each set of 10 deep breaths, practice coughing to be sure your lungs are clear. If you have an incision (the cut made at the time of surgery), support your incision when coughing by placing a pillow or rolled up towels firmly against it. Once you are able to get out of bed, walk around indoors and cough well. You may stop using the incentive spirometer when instructed by your caregiver.  RISKS AND COMPLICATIONS Take your time so you do not get dizzy or light-headed. If you are in pain, you may need to take or ask for pain medication before doing incentive spirometry. It is harder to take a deep breath if you are having pain. AFTER USE Rest and breathe slowly and easily. It can be helpful to keep track of a log of your progress. Your caregiver can provide you with a simple table to help with this. If you are using the spirometer at home, follow  these instructions: SEEK MEDICAL CARE IF:  You are having difficultly using the spirometer. You have trouble using the spirometer as often as instructed. Your pain medication is not giving enough relief while using the  spirometer. You develop fever of 100.5 F (38.1 C) or higher. SEEK IMMEDIATE MEDICAL CARE IF:  You cough up bloody sputum that had not been present before. You develop fever of 102 F (38.9 C) or greater. You develop worsening pain at or near the incision site. MAKE SURE YOU:  Understand these instructions. Will watch your condition. Will get help right away if you are not doing well or get worse. Document Released: 03/09/2007 Document Revised: 01/19/2012 Document Reviewed: 05/10/2007 [facility] Patient Information 2014 [place], [place].   ________________________________________________________________________

## 2022-09-08 NOTE — Progress Notes (Signed)
Pre-Operative Nutrition Class:    Patient was seen on 09/08/2022 for Pre-Operative Bariatric Surgery Education at the [facility].    Surgery date: 09/22/2022 Surgery type: Sleeve Gastrectomy  Anthropometrics  Start weight at [facility]: 260.7 lbs (date: 01/21/2022) Height: 71 inches Today's weight: 261.2 lbs BMI: 36.43 kg/m2  (pt is aware of her BMI and insurance coverage)  Clinical  Medical hx: sleep apnea, GERD, cancer of descending colon (some removed), anemia, anxiety, CKD, DM Medications: taking B12 monthly,  Labs: A1C 6.5, creatinine 1.56, GFR 35.85, albumin 3.4 Notable signs/symptoms: migraines, constipation Any previous deficiencies? Yes: iron and b12  Samples given per MNT protocol. Patient educated on appropriate usage:  [place] Lot # [id number] Exp: 9/26  Bariatric Advantage Calcium  Lot # [id number] Exp:09/30/2022  Ensure Max Protein Shake Lot # [id number] Exp: [id number]  The following the learning objectives were met by the patient during this course: Identify Pre-Op Dietary Goals and will begin 2 weeks pre-operatively Identify appropriate sources of fluids and proteins  State protein recommendations and appropriate sources pre and post-operatively Identify Post-Operative Dietary Goals and will follow for 2 weeks post-operatively Identify appropriate multivitamin and calcium sources Describe the need for physical activity post-operatively and will follow MD recommendations State when to call healthcare provider regarding medication questions or post-operative complications When having a diagnosis of diabetes understanding hypoglycemia symptoms and the inclusion of 1 complex carbohydrate per meal  Handouts given during class include: Pre-Op Bariatric Surgery Diet Handout Protein Shake Handout Post-Op Bariatric Surgery Nutrition Handout BELT Program Information Flyer Support Group Information Flyer [facility]  Outpatient Pharmacy Bariatric Supplements Price List  Follow-Up Plan: Patient will follow-up at [facility] 2 weeks post operatively for diet advancement per MD.

## 2022-09-09 ENCOUNTER — Telehealth: Payer: Self-pay | Admitting: Gastroenterology

## 2022-09-09 NOTE — Telephone Encounter (Signed)
Inbound call from patient making [place] aware that she is having an additional surgery on Nov 13. Patient is scheduled for a procedure with [place] on 11/9. Patient states she has already spoken with the surgeon and they stated it is ok for her to have both. Please advise if needed.  Thank you

## 2022-09-11 ENCOUNTER — Encounter (HOSPITAL_COMMUNITY)
Admission: RE | Admit: 2022-09-11 | Discharge: 2022-09-11 | Disposition: A | Discharge status: Home / Self Care | Payer: 59 | Source: Ambulatory Visit | Attending: Surgery | Admitting: Surgery

## 2022-09-11 ENCOUNTER — Other Ambulatory Visit: Payer: Self-pay

## 2022-09-11 ENCOUNTER — Encounter (HOSPITAL_COMMUNITY): Payer: Self-pay

## 2022-09-11 DIAGNOSIS — Z01812 Encounter for preprocedural laboratory examination: Secondary | ICD-10-CM | POA: Insufficient documentation

## 2022-09-11 DIAGNOSIS — R7303 Prediabetes: Secondary | ICD-10-CM | POA: Diagnosis not present

## 2022-09-11 HISTORY — DX: Prediabetes: R73.03

## 2022-09-11 LAB — BASIC METABOLIC PANEL
Anion gap: 7 (ref 5–15)
BUN: 24 mg/dL — ABNORMAL HIGH (ref 8–23)
CO2: 26 mmol/L (ref 22–32)
Calcium: 9.2 mg/dL (ref 8.9–10.3)
Chloride: 105 mmol/L (ref 98–111)
Creatinine, Ser: 1.58 mg/dL — ABNORMAL HIGH (ref 0.44–1.00)
GFR, Estimated: 37 mL/min — ABNORMAL LOW (ref >60)
Glucose, Bld: 105 mg/dL — ABNORMAL HIGH (ref 70–99)
Potassium: 4 mmol/L (ref 3.5–5.1)
Sodium: 138 mmol/L (ref 135–145)

## 2022-09-11 LAB — CBC
HCT: 45.9 % (ref 36.0–46.0)
Hemoglobin: 14 g/dL (ref 12.0–15.0)
MCH: 21.9 pg — ABNORMAL LOW (ref 26.0–34.0)
MCHC: 30.5 g/dL (ref 30.0–36.0)
MCV: 71.8 fL — ABNORMAL LOW (ref 80.0–100.0)
Platelets: 151 10*3/uL (ref 150–400)
RBC: 6.39 MIL/uL — ABNORMAL HIGH (ref 3.87–5.11)
RDW: 18.5 % — ABNORMAL HIGH (ref 11.5–15.5)
WBC: 6.2 10*3/uL (ref 4.0–10.5)
nRBC: 0 % (ref 0.0–0.2)

## 2022-09-11 LAB — HEMOGLOBIN A1C
Hgb A1c MFr Bld: 6.4 % — ABNORMAL HIGH (ref 4.8–5.6)
Mean Plasma Glucose: 136.98 mg/dL

## 2022-09-11 NOTE — Telephone Encounter (Signed)
Thanks for letting me know RG 

## 2022-09-11 NOTE — Progress Notes (Signed)
Anesthesia note:  Bowel prep reminder:  no  PCP - Marrian Salvage FNP Cardiologist -none Other-   Chest x-ray - 07/08/22-epic EKG - 07/08/22-epic Stress Test - 2018 ECHO - 2018 Cardiac Cath - no CABG-no Pacemaker/ICD device last checked:no  Sleep Study - yes CPAP - yes  Pt is pre diabetic-yes no meds CBG at PAT visit- Fasting Blood Sugar at home- Checks Blood Sugar _____  Blood Thinner:NA Blood Thinner Instructions: Aspirin Instructions: Last Dose:  Anesthesia review: Yes   Patient denies shortness of breath, fever, cough and chest pain at PAT appointment Pt reports no SOB with activities but she still is healing from ankle fracture in 2022.  Patient verbalized understanding of instructions that were given to them at the PAT appointment. Patient was also instructed that they will need to review over the PAT instructions again at home before surgery.yes

## 2022-09-12 ENCOUNTER — Encounter: Payer: Self-pay | Admitting: Gastroenterology

## 2022-09-14 ENCOUNTER — Encounter: Payer: Self-pay | Admitting: Gastroenterology

## 2022-09-15 ENCOUNTER — Ambulatory Visit: Payer: Self-pay | Admitting: Surgery

## 2022-09-17 ENCOUNTER — Encounter: Payer: 59 | Admitting: Gastroenterology

## 2022-09-18 ENCOUNTER — Encounter: Payer: Self-pay | Admitting: Gastroenterology

## 2022-09-18 ENCOUNTER — Ambulatory Visit (AMBULATORY_SURGERY_CENTER): Payer: 59 | Admitting: Gastroenterology

## 2022-09-18 VITALS — BP 127/72 | HR 70 | Temp 98.0°F | Resp 15 | Ht 67.5 in | Wt 255.0 lb

## 2022-09-18 DIAGNOSIS — Z08 Encounter for follow-up examination after completed treatment for malignant neoplasm: Secondary | ICD-10-CM | POA: Diagnosis not present

## 2022-09-18 DIAGNOSIS — Z85038 Personal history of other malignant neoplasm of large intestine: Secondary | ICD-10-CM

## 2022-09-18 MED ORDER — SODIUM CHLORIDE 0.9 % IV SOLN: 500.0000 mL | Freq: Once | INTRAVENOUS | Status: DC

## 2022-09-18 NOTE — Op Note (Signed)
[facility] Patient Name: Mary [name] Procedure Date: 09/18/2022 2:06 PM MRN: [id number] Endoscopist: Jackquline Denmark , MD, [id number] Age: 61 Referring MD:  Date of Birth: 1960/12/25 Gender: Female Account #: [id number] Procedure:                Colonoscopy Indications:              1. H/O Colon cancer s/p sigmoid resection followed                            by neoadjuvant chemo. (Stage III) 05/2011. 2. H/O                            polyps Medicines:                Monitored Anesthesia Care Procedure:                Pre-Anesthesia Assessment:                           - Prior to the procedure, a History and Physical                            was performed, and patient medications and                            allergies were reviewed. The patient's tolerance of                            previous anesthesia was also reviewed. The risks                            and benefits of the procedure and the sedation                            options and risks were discussed with the patient.                            All questions were answered, and informed consent                            was obtained. Prior Anticoagulants: The patient has                            taken no anticoagulant or antiplatelet agents. ASA                            Grade Assessment: II - A patient with mild systemic                            disease. After reviewing the risks and benefits,                            the patient was deemed in satisfactory condition to  undergo the procedure.                           After obtaining informed consent, the colonoscope                            was passed under direct vision. Throughout the                            procedure, the patient's blood pressure, pulse, and                            oxygen saturations were monitored continuously. The                            Olympus PCF-H190DL (#[id number]) Colonoscope was                             introduced through the anus and advanced to the 2                            cm into the ileum. The colonoscopy was performed                            without difficulty. The patient tolerated the                            procedure well. The quality of the bowel                            preparation was good. The terminal ileum, ileocecal                            valve, appendiceal orifice, and rectum were                            photographed. Scope In: 2:18:12 PM Scope Out: 2:29:48 PM Scope Withdrawal Time: 0 hours 8 minutes 34 seconds  Total Procedure Duration: 0 hours 11 minutes 36 seconds  Findings:                 Multiple medium-mouthed diverticula were found in                            the neo-sigmoid colon, descending colon and few in                            ascending colon.                           There was evidence of a prior end-to-side                            colo-colonic anastomosis in the sigmoid colon, 30  cm from the anal verge. This was patent and was                            characterized by healthy appearing mucosa. No                            recurrence.                           The terminal ileum appeared normal.                           Non-bleeding internal hemorrhoids were found during                            retroflexion. The hemorrhoids were small and Grade                            I (internal hemorrhoids that do not prolapse).                           The exam was otherwise without abnormality on                            direct and retroflexion views. Complications:            No immediate complications. Estimated Blood Loss:     Estimated blood loss: none. Impression:               - Moderate pancolonic diverticulosis predominantly                            in the left colon.                           - Patent end-to-side colo-colonic anastomosis,                             characterized by healthy appearing mucosa.                           - The examined portion of the ileum was normal.                           - Non-bleeding internal hemorrhoids.                           - The examination was otherwise normal on direct                            and retroflexion views.                           - No specimens collected. Recommendation:           - Patient has a contact number available for  emergencies. The signs and symptoms of potential                            delayed complications were discussed with the                            patient. Return to normal activities tomorrow.                            Written discharge instructions were provided to the                            patient.                           - High fiber diet. Start fiber supplements if she                            has hard stools or gets constipated. Increase water                            intake.                           - Continue present medications.                           - Repeat colonoscopy in 5 years for surveillance.                            Earlier, if with any new problems or change in                            family history.                           - The findings and recommendations were discussed                            with the patient's family. Jackquline Denmark, MD 09/18/2022 2:35:56 PM This report has been signed electronically.

## 2022-09-18 NOTE — Progress Notes (Signed)
[facility] History and Physical   Primary Care Physician:  Marrian Salvage, [place]   Reason for Procedure:   H/o Colon cancer s/p  sigmoid resection 05/2011/H/o polyps  Plan:    Colon today     HPI: Mary [name] is a 61 y.o. female  No nausea, vomiting, heartburn, regurgitation, odynophagia or dysphagia.  No significant diarrhea.  No melena or hematochezia. No unintentional weight loss. No abdominal pain. 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 w/prolonged cold sensitivity--Oxaliplatin related   Pneumothorax 07/19/2011   Bilateral   Pre-diabetes    Renal cysts, acquired, bilateral    Sleep apnea    does not use CPAP on regular basis    Past Surgical History:  Procedure Laterality Date   ABDOMINAL HYSTERECTOMY  mid 20s   bleeding   COLON SURGERY  05/17/11   Partial colectomy   COLONOSCOPY  09/04/2014   One diminutive polyp at the recto sigmoid colon removed. Diverticulosis of the entire examined colon. Internal hemorrhoids.    HERNIA REPAIR  09/06/12   lap incisional hernia   INSERTION OF MESH  09/06/2012   Procedure: INSERTION OF MESH;  Surgeon: Gwenyth Ober, MD;  Location: [facility];  Service: General;  Laterality: N/A;   ORIF ANKLE FRACTURE Left 12/27/2020   Procedure: OPEN REDUCTION INTERNAL FIXATION (ORIF) Left ankle trimalleolar fracture dislocation;  Surgeon: Wylene Simmer, MD;  Location: [facility];  Service: Orthopedics;  Laterality: Left;  18mn    PORT-A-CATH REMOVAL  05/06/2012   Procedure: MINOR REMOVAL PORT-A-CATH;  Surgeon: JGwenyth Ober MD;  Location[place] [facility]  Service: General;  Laterality: Right;   PORTACATH PLACEMENT  07/20/11   Right side placed   PORTACATH PLACEMENT     left side & right side   VENTRAL HERNIA REPAIR  09/06/2012   Procedure: LAPAROSCOPIC VENTRAL HERNIA;  Surgeon: JGwenyth Ober MD;  Location: [facility]  Service: General;  Laterality: N/A;    Prior to Admission medications   Medication Sig Start Date End Date Taking? Authorizing Provider  ACCU-CHEK GUIDE test strip USE   TO CHECK GLUCOSE UP TO 4 TIMES DAILY 09/04/22  Yes MMarrian Salvage FNP  Accu-Chek Softclix Lancets lancets USE   TO CHECK GLUCOSE UP TO 4 TIMES DAILY 09/04/22  Yes MMarrian Salvage FNP  amLODipine (NORVASC) 5 MG tablet Take 1 tablet (5 mg total) by mouth daily. 08/08/22  Yes MMarrian Salvage FNP  blood glucose meter kit and supplies KIT Dispense based on patient and insurance preference. Use up to four times daily as directed. 04/22/22  Yes MMarrian Salvage FNP  gabapentin (NEURONTIN) 300 MG capsule TAKE 1 CAPSULE BY MOUTH THREE TIMES DAILY 07/24/21  Yes CHoyt Koch MD  IRON, FERROUS SULFATE, PO Take 1 tablet by mouth daily.   Yes [provider]  Oxycodone HCl 10 MG TABS Take 10 mg by mouth 4 (four) times daily  as needed (pain.).   Yes [provider]  pantoprazole (PROTONIX) 40 MG tablet Take 40 mg by mouth 2 (two) times daily.   Yes [provider]  sucralfate (CARAFATE) 1 g tablet Take 1 tablet (1 g total) by mouth with breakfast, with lunch, and with evening meal for 7 days. Patient not taking: Reported on 07/10/2022 07/08/22 07/15/22  Wynona Dove A, DO  potassium chloride SA (K-DUR,KLOR-CON) 20 MEQ tablet Take 20 mEq by mouth daily. 11/25/11 01/20/12  Owens Shark, NP  prochlorperazine (COMPAZINE) 10 MG tablet Take 10 mg by mouth every 6 (six) hours as needed. For  nausea/vomiting.  01/18/12  Ladell Pier, MD    Current Outpatient Medications  Medication Sig Dispense Refill   ACCU-CHEK GUIDE test strip USE   TO CHECK GLUCOSE UP TO 4 TIMES DAILY 100 each 0   Accu-Chek Softclix Lancets lancets USE   TO CHECK GLUCOSE UP TO 4 TIMES DAILY 100 each 0   amLODipine (NORVASC) 5 MG tablet Take 1 tablet (5 mg total) by mouth daily. 90 tablet 0   blood glucose meter kit and supplies KIT Dispense based on patient and insurance preference. Use up to four times daily as directed. 1 each 0   gabapentin (NEURONTIN) 300 MG capsule TAKE 1 CAPSULE BY MOUTH THREE TIMES DAILY 90 capsule 0   IRON, FERROUS SULFATE, PO Take 1 tablet by mouth daily.     Oxycodone HCl 10 MG TABS Take 10 mg by mouth 4 (four) times daily as needed (pain.).     pantoprazole (PROTONIX) 40 MG tablet Take 40 mg by mouth 2 (two) times daily.     sucralfate (CARAFATE) 1 g tablet Take 1 tablet (1 g total) by mouth with breakfast, with lunch, and with evening meal for 7 days. (Patient not taking: Reported on 07/10/2022) 21 tablet 0   Current Facility-Administered Medications  Medication Dose Route Frequency Provider Last Rate Last Admin   0.9 %  sodium chloride infusion  500 mL Intravenous Once Jackquline Denmark, MD       cyanocobalamin ((VITAMIN B-12)) injection 1,000 mcg  1,000 mcg Intramuscular Once Marrian Salvage, FNP       cyanocobalamin ((VITAMIN B-12)) injection 1,000 mcg  1,000 mcg Intramuscular Q30 days Marrian Salvage, FNP   1,000 mcg at 05/14/22 1509    Allergies as of 09/18/2022 - Review Complete 09/18/2022  Allergen Reaction Noted   Tylenol [acetaminophen] Hives and Itching 01/02/2022    Family History  Problem Relation Age of Onset   Diabetes Mother    Hypertension Mother    Cancer Father        leukemia   Cancer Maternal Uncle        lung   Cancer Maternal Uncle        colon   Colitis Maternal Uncle    Cancer Maternal Uncle        prostate   Cancer Brother         PROSTATE   Other Brother        MVA   Esophageal cancer Neg Hx    Rectal cancer Neg Hx    Stomach cancer Neg Hx     Social History   Socioeconomic History   Marital status: Married    Spouse name: Not on file   Number of children: 3   Years of education: Not on file   Highest education level: Not on file  Occupational History   Occupation: Radio broadcast assistant  Tobacco Use   Smoking status: Never   Smokeless tobacco: Never  Vaping Use   Vaping Use: Never used  Substance and Sexual Activity   Alcohol use: No   Drug use: No   Sexual activity: Yes    Birth control/protection: Surgical    Comment: 1st intercourse 60 yo-Fewer than 5 partners  Other Topics Concern   Not on file  Social History Narrative   Lives with husband in a 1 story home.  Has 3 daughters and 7 grandkids and one on the way.     Works as a Merchandiser, retail.    Education: high school.   Social Determinants of Health   Financial Resource Strain: Not on file  Food Insecurity: Not on file  Transportation Needs: Not on file  Physical Activity: Not on file  Stress: Not on file  Social Connections: Not on file  Intimate Partner Violence: Not on file    Review of Systems: Positive for none All other review of systems negative except as mentioned in the HPI.  Physical Exam: Vital signs in last 24 hours: _0 @   General:   Alert,  Well-developed, well-nourished, pleasant and cooperative in NAD Lungs:  Clear throughout to auscultation.   Heart:  Regular rate and rhythm; no murmurs, clicks, rubs,  or gallops. Abdomen:  Soft, nontender and nondistended. Normal bowel sounds.   Neuro/Psych:  Alert and cooperative. Normal mood and affect. A and O x 3    No significant changes were identified.  The patient continues to be an appropriate candidate for the planned procedure and anesthesia.   Carmell Austria, MD. [facility] 09/18/2022 2:14 PM@

## 2022-09-18 NOTE — Patient Instructions (Signed)
Handout on diverticulosis given. High fiber diet,  increase water intake.  Repeat colonoscopy in 5 years for surveillance. Earlier, if any new problems or change in family history.   YOU HAD AN ENDOSCOPIC PROCEDURE TODAY AT [facility]:   Refer to the procedure report that was given to you for any specific questions about what was found during the examination.  If the procedure report does not answer your questions, please call your gastroenterologist to clarify.  If you requested that your care partner not be given the details of your procedure findings, then the procedure report has been included in a sealed envelope for you to review at your convenience later.  YOU SHOULD EXPECT: Some feelings of bloating in the abdomen. Passage of more gas than usual.  Walking can help get rid of the air that was put into your GI tract during the procedure and reduce the bloating. If you had a lower endoscopy (such as a colonoscopy or flexible sigmoidoscopy) you may notice spotting of blood in your stool or on the toilet paper. If you underwent a bowel prep for your procedure, you may not have a normal bowel movement for a few days.  Please Note:  You might notice some irritation and congestion in your nose or some drainage.  This is from the oxygen used during your procedure.  There is no need for concern and it should clear up in a day or so.  SYMPTOMS TO REPORT IMMEDIATELY:  Following lower endoscopy (colonoscopy or flexible sigmoidoscopy):  Excessive amounts of blood in the stool  Significant tenderness or worsening of abdominal pains  Swelling of the abdomen that is new, acute  Fever of 100F or higher   For urgent or emergent issues, a gastroenterologist can be reached at any hour by calling [phone]. Do not use MyChart messaging for urgent concerns.    DIET:  We do recommend a small meal at first, but then you may proceed to your regular diet.  Drink plenty of fluids but you  should avoid alcoholic beverages for 24 hours.  ACTIVITY:  You should plan to take it easy for the rest of today and you should NOT DRIVE or use heavy machinery until tomorrow (because of the sedation medicines used during the test).    FOLLOW UP: Our staff will call the number listed on your records the next business day following your procedure.  We will call around 7:15- 8:00 am to check on you and address any questions or concerns that you may have regarding the information given to you following your procedure. If we do not reach you, we will leave a message.     If any biopsies were taken you will be contacted by phone or by letter within the next 1-3 weeks.  Please call us at [phone] if you have not heard about the biopsies in 3 weeks.    SIGNATURES/CONFIDENTIALITY: You and/or your care partner have signed paperwork which will be entered into your electronic medical record.  These signatures attest to the fact that that the information above on your After Visit Summary has been reviewed and is understood.  Full responsibility of the confidentiality of this discharge information lies with you and/or your care-partner.

## 2022-09-18 NOTE — Progress Notes (Signed)
Sedate, gd SR, tolerated procedure well, VSS, report to RN 

## 2022-09-18 NOTE — Progress Notes (Signed)
Pt's states no medical or surgical changes since previsit or office visit. PT did not stop her Iron prior to the procedure. Will let Dr. Lyndel Safe know,

## 2022-09-19 ENCOUNTER — Telehealth: Payer: Self-pay | Admitting: *Deleted

## 2022-09-19 NOTE — Telephone Encounter (Signed)
No answer on  follow up call. Left message.   

## 2022-09-21 NOTE — H&P (Signed)
PROVIDER: Joya San, MD  MRN: [id number] DOB: 11-10-1961 Subjective  Chief Complaint: Bariatric pre-op (Sleeve)  History of Present Illness: Mary [name] is a 61 y.o. female who is seen today for preoperative assessment for a sleeve gastrectomy. She was told that this will likely be scheduled on November 13. That is a day to we are working for her. We had previously talked about a lap band and she has read more and decided she would now like to have a sleeve gastrectomy. . She is aware of the pitfalls and risk of a sleeve gastrectomy and I reiterated the importance of following diet afterwards. She is aware that there is weight recidivism and GERD is a risk.    Her physical exam has not changed. No of DVT or history of any other issues. 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  Review of Systems: See HPI as well for other ROS.  ROS  Medical History: Past Medical History: Diagnosis Date Right foot pain  Patient Active Problem List Diagnosis Right foot pain Cancer of descending colon (CMS-HCC) Cellulitis Chest pain Chronic nonintractable headache Chronic pain syndrome Closed dislocation of ankle Complication associated with orthopedic device (CMS-HCC) DVT (deep venous thrombosis) (CMS-HCC) GERD (gastroesophageal reflux disease) Hearing loss due to cerumen impaction Left trimalleolar fracture, closed, initial encounter Labial cyst Iron deficiency anemia, unspecified Impaired fasting blood sugar Hernia Peripheral neuropathy due to chemotherapy (CMS-HCC) Otomycosis of both ears Plantar fasciitis, right Right calcaneal fracture Ankle edema, bilateral Abnormal x-ray Acute gastritis Benign neoplasm of rectum and anal canal Constipation Diverticular disease of colon Flatulence, eructation and gas pain History of malignant neoplasm of colon OSA (obstructive sleep apnea) Personal history of colonic polyps Rectal bleeding  Past Surgical  History: Procedure Laterality Date COLECTOMY PARTIAL W/ANASTAMOSIS Left 12/2010 4/17 nodes positive for cancer HYSTERECTOMY KNEE ARTHROSCOPY TUBAL LIGATION   No Known Allergies  Current Outpatient Medications on File Prior to Visit Medication Sig Dispense Refill pantoprazole (PROTONIX) 40 MG DR tablet Take by mouth sucralfate (CARAFATE) 1 gram tablet TAKE 1 TABLET BY MOUTH WITH BREAKFAST WITH LUNCH AND WITH EVENING MEAL FOR 7 DAYS diclofenac (VOLTAREN) 75 MG EC tablet (Patient not taking: Reported on 12/06/2021) oxyCODONE-acetaminophen (PERCOCET) 5-325 mg tablet (Patient not taking: Reported on 07/23/2022)  No current facility-administered medications on file prior to visit.  Family History Problem Relation Age of Onset Sickle cell anemia Other   Social History  Tobacco Use Smoking Status Never Smokeless Tobacco Never   Social History  Socioeconomic History Marital status: Married Tobacco Use Smoking status: Never Smokeless tobacco: Never Substance and Sexual Activity Alcohol use: No Alcohol/week: 0.0 standard drinks Drug use: No  Objective:  Vitals: Weight: (!) 119.8 kg (264 lb 3.2 oz) Height: 180.3 cm ('5\' 11"'$ )  Body mass index is 36.85 kg/m.  Physical Exam General: Well maintained African-American female no acute distress HEENT: Unremarkable Chest : Clear Heart: Sinus rhythm Breast: Not examined Abdomen: Nontender GU not examined Rectal not examined Extremities full range of motion Neuro alert and oriented x3. Motor and sensory function grossly intact  Labs, Imaging and Diagnostic Testing:  I reviewed her upper GI series from February. On the fluoroscopy images I did not necessarily appreciate much of a hiatal hernia although they read out as showing a small hiatal hernia.  Assessment and Plan:  Obesity (BMI 30-39.9), unspecified    Obesity in a patient who would like to pursue robotic sleeve gastrectomy. I explained the procedure to her in  some detail.  Mary [name] Mary Pounds, MD

## 2022-09-22 ENCOUNTER — Ambulatory Visit (HOSPITAL_COMMUNITY): Payer: 59 | Admitting: Certified Registered Nurse Anesthetist

## 2022-09-22 ENCOUNTER — Inpatient Hospital Stay (HOSPITAL_COMMUNITY)
Admission: AD | Admit: 2022-09-22 | Discharge: 2022-09-24 | DRG: 621 | Disposition: A | Discharge status: Home / Self Care | Payer: 59 | Attending: Surgery | Admitting: Surgery

## 2022-09-22 ENCOUNTER — Other Ambulatory Visit: Payer: Self-pay

## 2022-09-22 ENCOUNTER — Encounter (HOSPITAL_COMMUNITY)
Admission: AD | Disposition: A | Discharge status: Home / Self Care | Payer: Self-pay | Source: Home / Self Care | Attending: Surgery

## 2022-09-22 ENCOUNTER — Other Ambulatory Visit (HOSPITAL_COMMUNITY): Payer: Self-pay

## 2022-09-22 ENCOUNTER — Encounter (HOSPITAL_COMMUNITY): Payer: Self-pay | Admitting: Surgery

## 2022-09-22 DIAGNOSIS — T451X5A Adverse effect of antineoplastic and immunosuppressive drugs, initial encounter: Secondary | ICD-10-CM | POA: Diagnosis present

## 2022-09-22 DIAGNOSIS — Z8719 Personal history of other diseases of the digestive system: Secondary | ICD-10-CM | POA: Diagnosis not present

## 2022-09-22 DIAGNOSIS — Z79899 Other long term (current) drug therapy: Secondary | ICD-10-CM

## 2022-09-22 DIAGNOSIS — Z832 Family history of diseases of the blood and blood-forming organs and certain disorders involving the immune mechanism: Secondary | ICD-10-CM

## 2022-09-22 DIAGNOSIS — K660 Peritoneal adhesions (postprocedural) (postinfection): Secondary | ICD-10-CM | POA: Diagnosis present

## 2022-09-22 DIAGNOSIS — K219 Gastro-esophageal reflux disease without esophagitis: Secondary | ICD-10-CM | POA: Diagnosis present

## 2022-09-22 DIAGNOSIS — G620 Drug-induced polyneuropathy: Secondary | ICD-10-CM | POA: Diagnosis present

## 2022-09-22 DIAGNOSIS — Z6839 Body mass index (BMI) 39.0-39.9, adult: Secondary | ICD-10-CM | POA: Diagnosis not present

## 2022-09-22 DIAGNOSIS — G894 Chronic pain syndrome: Secondary | ICD-10-CM | POA: Diagnosis present

## 2022-09-22 DIAGNOSIS — Z903 Acquired absence of stomach [part of]: Principal | ICD-10-CM

## 2022-09-22 DIAGNOSIS — Z86718 Personal history of other venous thrombosis and embolism: Secondary | ICD-10-CM | POA: Diagnosis not present

## 2022-09-22 DIAGNOSIS — I10 Essential (primary) hypertension: Secondary | ICD-10-CM | POA: Diagnosis not present

## 2022-09-22 DIAGNOSIS — Z90710 Acquired absence of both cervix and uterus: Secondary | ICD-10-CM

## 2022-09-22 DIAGNOSIS — R7303 Prediabetes: Secondary | ICD-10-CM

## 2022-09-22 DIAGNOSIS — G4733 Obstructive sleep apnea (adult) (pediatric): Secondary | ICD-10-CM

## 2022-09-22 DIAGNOSIS — Z85038 Personal history of other malignant neoplasm of large intestine: Secondary | ICD-10-CM

## 2022-09-22 DIAGNOSIS — Z9049 Acquired absence of other specified parts of digestive tract: Secondary | ICD-10-CM | POA: Diagnosis not present

## 2022-09-22 HISTORY — PX: UPPER GI ENDOSCOPY: SHX6162

## 2022-09-22 LAB — COMPREHENSIVE METABOLIC PANEL
ALT: 52 U/L — ABNORMAL HIGH (ref 0–44)
AST: 52 U/L — ABNORMAL HIGH (ref 15–41)
Albumin: 3.6 g/dL (ref 3.5–5.0)
Alkaline Phosphatase: 77 U/L (ref 38–126)
Anion gap: 13 (ref 5–15)
BUN: 18 mg/dL (ref 8–23)
CO2: 23 mmol/L (ref 22–32)
Calcium: 8.9 mg/dL (ref 8.9–10.3)
Chloride: 104 mmol/L (ref 98–111)
Creatinine, Ser: 1.41 mg/dL — ABNORMAL HIGH (ref 0.44–1.00)
GFR, Estimated: 42 mL/min — ABNORMAL LOW (ref >60)
Glucose, Bld: 179 mg/dL — ABNORMAL HIGH (ref 70–99)
Potassium: 3.6 mmol/L (ref 3.5–5.1)
Sodium: 140 mmol/L (ref 135–145)
Total Bilirubin: 0.6 mg/dL (ref 0.3–1.2)
Total Protein: 6.7 g/dL (ref 6.5–8.1)

## 2022-09-22 LAB — GLUCOSE, CAPILLARY
Glucose-Capillary: 174 mg/dL — ABNORMAL HIGH (ref 70–99)
Glucose-Capillary: 184 mg/dL — ABNORMAL HIGH (ref 70–99)
Glucose-Capillary: 162 mg/dL — ABNORMAL HIGH (ref 70–99)

## 2022-09-22 LAB — CBC WITH DIFFERENTIAL/PLATELET
Abs Immature Granulocytes: 0.09 10*3/uL — ABNORMAL HIGH (ref 0.00–0.07)
Basophils Absolute: 0 10*3/uL (ref 0.0–0.1)
Basophils Relative: 0 %
Eosinophils Absolute: 0 10*3/uL (ref 0.0–0.5)
Eosinophils Relative: 0 %
HCT: 42.6 % (ref 36.0–46.0)
Hemoglobin: 13.3 g/dL (ref 12.0–15.0)
Immature Granulocytes: 1 %
Lymphocytes Relative: 14 %
Lymphs Abs: 1.5 10*3/uL (ref 0.7–4.0)
MCH: 22.4 pg — ABNORMAL LOW (ref 26.0–34.0)
MCHC: 31.2 g/dL (ref 30.0–36.0)
MCV: 71.8 fL — ABNORMAL LOW (ref 80.0–100.0)
Monocytes Absolute: 0.3 10*3/uL (ref 0.1–1.0)
Monocytes Relative: 3 %
Neutro Abs: 8.8 10*3/uL — ABNORMAL HIGH (ref 1.7–7.7)
Neutrophils Relative %: 82 %
Platelets: 167 10*3/uL (ref 150–400)
RBC: 5.93 MIL/uL — ABNORMAL HIGH (ref 3.87–5.11)
RDW: 17.9 % — ABNORMAL HIGH (ref 11.5–15.5)
WBC: 10.8 10*3/uL — ABNORMAL HIGH (ref 4.0–10.5)
nRBC: 0 % (ref 0.0–0.2)

## 2022-09-22 LAB — TYPE AND SCREEN
ABO/RH(D): O POS
Antibody Screen: NEGATIVE

## 2022-09-22 LAB — HEMOGLOBIN AND HEMATOCRIT, BLOOD
HCT: 44.9 % (ref 36.0–46.0)
Hemoglobin: 13.8 g/dL (ref 12.0–15.0)

## 2022-09-22 SURGERY — XI ROBOTIC GASTRIC SLEEVE RESECTION
Anesthesia: General

## 2022-09-22 MED ORDER — HEPARIN SODIUM (PORCINE) 5000 UNIT/ML IJ SOLN: 5000.0000 [IU] | Freq: Three times a day (TID) | INTRAMUSCULAR | Status: DC

## 2022-09-22 MED ORDER — KETAMINE HCL 10 MG/ML IJ SOLN
INTRAMUSCULAR | Status: AC
  Filled 2022-09-22: qty 1

## 2022-09-22 MED ORDER — STERILE WATER FOR IRRIGATION IR SOLN
Status: DC | PRN
  Administered 2022-09-22: 1000 mL

## 2022-09-22 MED ORDER — FENTANYL CITRATE (PF) 250 MCG/5ML IJ SOLN
INTRAMUSCULAR | Status: AC
  Filled 2022-09-22: qty 5

## 2022-09-22 MED ORDER — SCOPOLAMINE 1 MG/3DAYS TD PT72
1.0000 | MEDICATED_PATCH | TRANSDERMAL | Status: DC
  Administered 2022-09-22: 1.5 mg via TRANSDERMAL
  Filled 2022-09-22: qty 1

## 2022-09-22 MED ORDER — ACETAMINOPHEN 160 MG/5ML PO SOLN: 1000.0000 mg | Freq: Three times a day (TID) | ORAL | Status: DC

## 2022-09-22 MED ORDER — AMLODIPINE BESYLATE 5 MG PO TABS
5.0000 mg | ORAL_TABLET | Freq: Every day | ORAL | Status: DC
  Administered 2022-09-23 – 2022-09-24 (×2): 5 mg via ORAL
  Filled 2022-09-22 (×2): qty 1

## 2022-09-22 MED ORDER — AMISULPRIDE (ANTIEMETIC) 5 MG/2ML IV SOLN: 10.0000 mg | Freq: Once | INTRAVENOUS | Status: DC | PRN

## 2022-09-22 MED ORDER — MORPHINE SULFATE (PF) 2 MG/ML IV SOLN
1.0000 mg | INTRAVENOUS | Status: DC | PRN
  Administered 2022-09-22: 2 mg via INTRAVENOUS
  Filled 2022-09-22: qty 1

## 2022-09-22 MED ORDER — DEXAMETHASONE SODIUM PHOSPHATE 10 MG/ML IJ SOLN
INTRAMUSCULAR | Status: DC | PRN
  Administered 2022-09-22: 10 mg via INTRAVENOUS

## 2022-09-22 MED ORDER — LACTATED RINGERS IV SOLN: INTRAVENOUS | Status: DC

## 2022-09-22 MED ORDER — ONDANSETRON HCL 4 MG/2ML IJ SOLN
4.0000 mg | INTRAMUSCULAR | Status: DC | PRN
  Administered 2022-09-22: 4 mg via INTRAVENOUS
  Filled 2022-09-22: qty 2

## 2022-09-22 MED ORDER — HYDROMORPHONE HCL 1 MG/ML IJ SOLN: 0.2500 mg | INTRAMUSCULAR | Status: DC | PRN

## 2022-09-22 MED ORDER — SODIUM CHLORIDE (PF) 0.9 % IJ SOLN
INTRAMUSCULAR | Status: DC | PRN
  Administered 2022-09-22: 10 mL via INTRAVENOUS

## 2022-09-22 MED ORDER — PROPOFOL 10 MG/ML IV BOLUS
INTRAVENOUS | Status: DC | PRN
  Administered 2022-09-22: 200 mg via INTRAVENOUS

## 2022-09-22 MED ORDER — PANTOPRAZOLE SODIUM 40 MG IV SOLR
40.0000 mg | Freq: Every day | INTRAVENOUS | Status: DC
  Administered 2022-09-22 – 2022-09-23 (×2): 40 mg via INTRAVENOUS
  Filled 2022-09-22 (×2): qty 10

## 2022-09-22 MED ORDER — OXYCODONE HCL 5 MG/5ML PO SOLN
5.0000 mg | Freq: Four times a day (QID) | ORAL | Status: DC | PRN
  Administered 2022-09-22 – 2022-09-23 (×6): 5 mg via ORAL
  Filled 2022-09-22 (×6): qty 5

## 2022-09-22 MED ORDER — FENTANYL CITRATE (PF) 250 MCG/5ML IJ SOLN
INTRAMUSCULAR | Status: DC | PRN
  Administered 2022-09-22 (×5): 50 ug via INTRAVENOUS

## 2022-09-22 MED ORDER — ENSURE MAX PROTEIN PO LIQD
2.0000 [oz_av] | ORAL | Status: DC
  Administered 2022-09-23 – 2022-09-24 (×7): 2 [oz_av] via ORAL

## 2022-09-22 MED ORDER — PROMETHAZINE HCL 25 MG/ML IJ SOLN: 6.2500 mg | INTRAMUSCULAR | Status: DC | PRN

## 2022-09-22 MED ORDER — METOPROLOL TARTRATE 5 MG/5ML IV SOLN: 5.0000 mg | Freq: Four times a day (QID) | INTRAVENOUS | Status: DC | PRN

## 2022-09-22 MED ORDER — ONDANSETRON HCL 4 MG/2ML IJ SOLN
INTRAMUSCULAR | Status: DC | PRN
  Administered 2022-09-22: 4 mg via INTRAVENOUS

## 2022-09-22 MED ORDER — APREPITANT 40 MG PO CAPS
40.0000 mg | ORAL_CAPSULE | ORAL | Status: AC
  Administered 2022-09-22: 40 mg via ORAL
  Filled 2022-09-22: qty 1

## 2022-09-22 MED ORDER — LIDOCAINE HCL (PF) 2 % IJ SOLN
INTRAMUSCULAR | Status: AC
  Filled 2022-09-22: qty 10

## 2022-09-22 MED ORDER — LIDOCAINE 2% (20 MG/ML) 5 ML SYRINGE
INTRAMUSCULAR | Status: DC | PRN
  Administered 2022-09-22: 1.5 mg/kg/h via INTRAVENOUS
  Administered 2022-09-22: 100 mg via INTRAVENOUS

## 2022-09-22 MED ORDER — KCL IN DEXTROSE-NACL 20-5-0.45 MEQ/L-%-% IV SOLN
INTRAVENOUS | Status: DC
  Filled 2022-09-22 (×5): qty 1000

## 2022-09-22 MED ORDER — KETAMINE HCL 10 MG/ML IJ SOLN
INTRAMUSCULAR | Status: DC | PRN
  Administered 2022-09-22: 50 mg via INTRAVENOUS

## 2022-09-22 MED ORDER — SODIUM CHLORIDE 0.9 % IR SOLN
Status: DC | PRN
  Administered 2022-09-22: 1000 mL

## 2022-09-22 MED ORDER — SUGAMMADEX SODIUM 200 MG/2ML IV SOLN
INTRAVENOUS | Status: DC | PRN
  Administered 2022-09-22: 400 mg via INTRAVENOUS

## 2022-09-22 MED ORDER — ROCURONIUM BROMIDE 10 MG/ML (PF) SYRINGE
PREFILLED_SYRINGE | INTRAVENOUS | Status: AC
  Filled 2022-09-22: qty 10

## 2022-09-22 MED ORDER — LACTATED RINGERS IR SOLN
Status: DC | PRN
  Administered 2022-09-22: 1000 mL

## 2022-09-22 MED ORDER — LIDOCAINE HCL (PF) 2 % IJ SOLN
INTRAMUSCULAR | Status: AC
  Filled 2022-09-22: qty 5

## 2022-09-22 MED ORDER — HEPARIN SODIUM (PORCINE) 5000 UNIT/ML IJ SOLN
5000.0000 [IU] | Freq: Three times a day (TID) | INTRAMUSCULAR | Status: DC
  Administered 2022-09-22 – 2022-09-24 (×5): 5000 [IU] via SUBCUTANEOUS
  Filled 2022-09-22 (×5): qty 1

## 2022-09-22 MED ORDER — CHLORHEXIDINE GLUCONATE 0.12 % MT SOLN
15.0000 mL | Freq: Once | OROMUCOSAL | Status: AC
  Administered 2022-09-22: 15 mL via OROMUCOSAL

## 2022-09-22 MED ORDER — MIDAZOLAM HCL 2 MG/2ML IJ SOLN
INTRAMUSCULAR | Status: AC
  Filled 2022-09-22: qty 2

## 2022-09-22 MED ORDER — ONDANSETRON HCL 4 MG/2ML IJ SOLN
INTRAMUSCULAR | Status: AC
  Filled 2022-09-22: qty 2

## 2022-09-22 MED ORDER — ROCURONIUM BROMIDE 10 MG/ML (PF) SYRINGE
PREFILLED_SYRINGE | INTRAVENOUS | Status: DC | PRN
  Administered 2022-09-22: 100 mg via INTRAVENOUS
  Administered 2022-09-22: 20 mg via INTRAVENOUS

## 2022-09-22 MED ORDER — OXYCODONE HCL 5 MG/5ML PO SOLN: 5.0000 mg | Freq: Once | ORAL | Status: DC | PRN

## 2022-09-22 MED ORDER — PROPOFOL 10 MG/ML IV BOLUS
INTRAVENOUS | Status: AC
  Filled 2022-09-22: qty 20

## 2022-09-22 MED ORDER — MIDAZOLAM HCL 5 MG/5ML IJ SOLN
INTRAMUSCULAR | Status: DC | PRN
  Administered 2022-09-22: 2 mg via INTRAVENOUS

## 2022-09-22 MED ORDER — ACETAMINOPHEN 500 MG PO TABS: 1000.0000 mg | ORAL_TABLET | Freq: Three times a day (TID) | ORAL | Status: DC

## 2022-09-22 MED ORDER — SUGAMMADEX SODIUM 500 MG/5ML IV SOLN
INTRAVENOUS | Status: AC
  Filled 2022-09-22: qty 5

## 2022-09-22 MED ORDER — CHLORHEXIDINE GLUCONATE CLOTH 2 % EX PADS: 6.0000 | MEDICATED_PAD | Freq: Once | CUTANEOUS | Status: DC

## 2022-09-22 MED ORDER — SODIUM CHLORIDE (PF) 0.9 % IJ SOLN
INTRAMUSCULAR | Status: AC
  Filled 2022-09-22: qty 10

## 2022-09-22 MED ORDER — PHENYLEPHRINE HCL-NACL 20-0.9 MG/250ML-% IV SOLN
INTRAVENOUS | Status: DC | PRN
  Administered 2022-09-22: 35 ug/min via INTRAVENOUS

## 2022-09-22 MED ORDER — INSULIN ASPART 100 UNIT/ML IJ SOLN
0.0000 [IU] | INTRAMUSCULAR | Status: DC
  Administered 2022-09-22 (×3): 4 [IU] via SUBCUTANEOUS
  Administered 2022-09-23 – 2022-09-24 (×3): 3 [IU] via SUBCUTANEOUS

## 2022-09-22 MED ORDER — ACETAMINOPHEN 500 MG PO TABS: 1000.0000 mg | ORAL_TABLET | ORAL | Status: DC

## 2022-09-22 MED ORDER — HYDROMORPHONE HCL 1 MG/ML IJ SOLN
INTRAMUSCULAR | Status: AC
  Filled 2022-09-22: qty 1

## 2022-09-22 MED ORDER — OXYCODONE HCL 5 MG PO TABS: 5.0000 mg | ORAL_TABLET | Freq: Once | ORAL | Status: DC | PRN

## 2022-09-22 MED ORDER — SODIUM CHLORIDE 0.9 % IV SOLN
2.0000 g | INTRAVENOUS | Status: AC
  Administered 2022-09-22: 2 g via INTRAVENOUS
  Filled 2022-09-22: qty 2

## 2022-09-22 MED ORDER — ORAL CARE MOUTH RINSE: 15.0000 mL | Freq: Once | OROMUCOSAL | Status: AC

## 2022-09-22 MED ORDER — HEPARIN SODIUM (PORCINE) 5000 UNIT/ML IJ SOLN
5000.0000 [IU] | INTRAMUSCULAR | Status: AC
  Administered 2022-09-22: 5000 [IU] via SUBCUTANEOUS
  Filled 2022-09-22: qty 1

## 2022-09-22 MED ORDER — DEXAMETHASONE SODIUM PHOSPHATE 10 MG/ML IJ SOLN
INTRAMUSCULAR | Status: AC
  Filled 2022-09-22: qty 1

## 2022-09-22 MED ORDER — BUPIVACAINE LIPOSOME 1.3 % IJ SUSP: 20.0000 mL | Freq: Once | INTRAMUSCULAR | Status: DC

## 2022-09-22 MED ORDER — BUPIVACAINE LIPOSOME 1.3 % IJ SUSP
INTRAMUSCULAR | Status: DC | PRN
  Administered 2022-09-22: 20 mL

## 2022-09-22 MED ORDER — BUPIVACAINE LIPOSOME 1.3 % IJ SUSP
INTRAMUSCULAR | Status: AC
  Filled 2022-09-22: qty 20

## 2022-09-22 SURGICAL SUPPLY — 73 items
ADH SKN CLS APL DERMABOND .7 (GAUZE/BANDAGES/DRESSINGS) ×1
APL PRP STRL LF DISP 70% ISPRP (MISCELLANEOUS) ×1
APPLIER CLIP 5 13 M/L LIGAMAX5 (MISCELLANEOUS) ×1
APPLIER CLIP ROT 10 11.4 M/L (STAPLE)
APR CLP MED LRG 11.4X10 (STAPLE)
APR CLP MED LRG 5 ANG JAW (MISCELLANEOUS) ×1
BLADE SURG 15 STRL LF DISP TIS (BLADE) ×1 IMPLANT
BLADE SURG 15 STRL SS (BLADE) ×1
CANNULA REDUC XI 12-8 STAPL (CANNULA) ×1
CANNULA REDUCER 12-8 DVNC XI (CANNULA) ×1 IMPLANT
CHLORAPREP W/TINT 26 (MISCELLANEOUS) ×1 IMPLANT
CLIP APPLIE 5 13 M/L LIGAMAX5 (MISCELLANEOUS) IMPLANT
CLIP APPLIE ROT 10 11.4 M/L (STAPLE) IMPLANT
COVER SURGICAL LIGHT HANDLE (MISCELLANEOUS) ×1 IMPLANT
DEFOGGER SCOPE WARMER CLEARIFY (MISCELLANEOUS) IMPLANT
DERMABOND ADVANCED .7 DNX12 (GAUZE/BANDAGES/DRESSINGS) ×1 IMPLANT
DRAPE ARM DVNC X/XI (DISPOSABLE) ×4 IMPLANT
DRAPE COLUMN DVNC XI (DISPOSABLE) ×1 IMPLANT
DRAPE DA VINCI XI ARM (DISPOSABLE) ×4
DRAPE DA VINCI XI COLUMN (DISPOSABLE) ×1
ELECT REM PT RETURN 15FT ADLT (MISCELLANEOUS) ×1 IMPLANT
GLOVE BIO SURGEON STRL SZ 6 (GLOVE) ×3 IMPLANT
GLOVE BIO SURGEON STRL SZ8 (GLOVE) ×2 IMPLANT
GLOVE INDICATOR 6.5 STRL GRN (GLOVE) ×3 IMPLANT
GOWN STRL REUS W/ TWL XL LVL3 (GOWN DISPOSABLE) ×3 IMPLANT
GOWN STRL REUS W/TWL XL LVL3 (GOWN DISPOSABLE) ×3
GRASPER SUT TROCAR 14GX15 (MISCELLANEOUS) ×1 IMPLANT
IRRIG SUCT STRYKERFLOW 2 WTIP (MISCELLANEOUS) ×1
IRRIGATION SUCT STRKRFLW 2 WTP (MISCELLANEOUS) ×1 IMPLANT
KIT BASIN OR (CUSTOM PROCEDURE TRAY) ×1 IMPLANT
KIT TURNOVER KIT A (KITS) IMPLANT
LUBRICANT JELLY K Y 4OZ (MISCELLANEOUS) IMPLANT
MARKER SKIN DUAL TIP RULER LAB (MISCELLANEOUS) ×1 IMPLANT
MAT PREVALON FULL STRYKER (MISCELLANEOUS) ×1 IMPLANT
NDL SPNL 22GX3.5 QUINCKE BK (NEEDLE) ×1 IMPLANT
NEEDLE SPNL 22GX3.5 QUINCKE BK (NEEDLE) ×1 IMPLANT
OBTURATOR OPTICAL STANDARD 8MM (TROCAR) ×1
OBTURATOR OPTICAL STND 8 DVNC (TROCAR) ×1
OBTURATOR OPTICALSTD 8 DVNC (TROCAR) ×1 IMPLANT
PACK CARDIOVASCULAR III (CUSTOM PROCEDURE TRAY) ×1 IMPLANT
RELOAD STAPLE 60 2.5 WHT DVNC (STAPLE) IMPLANT
RELOAD STAPLE 60 3.5 BLU DVNC (STAPLE) IMPLANT
RELOAD STAPLER 2.5X60 WHT DVNC (STAPLE) ×4 IMPLANT
RELOAD STAPLER 3.5X60 BLU DVNC (STAPLE) ×2 IMPLANT
SCISSORS LAP 5X35 DISP (ENDOMECHANICALS) IMPLANT
SEAL CANN UNIV 5-8 DVNC XI (MISCELLANEOUS) ×3 IMPLANT
SEAL XI 5MM-8MM UNIVERSAL (MISCELLANEOUS) ×3
SEALER VESSEL DA VINCI XI (MISCELLANEOUS) ×1
SEALER VESSEL EXT DVNC XI (MISCELLANEOUS) ×1 IMPLANT
SHEARS HARMONIC ACE PLUS 45CM (MISCELLANEOUS) IMPLANT
SLEEVE GASTRECTOMY 36FR VISIGI (MISCELLANEOUS) ×1 IMPLANT
SOL ANTI FOG 6CC (MISCELLANEOUS) ×1 IMPLANT
SOLUTION ELECTROLUBE (MISCELLANEOUS) ×1 IMPLANT
SPIKE FLUID TRANSFER (MISCELLANEOUS) ×1 IMPLANT
STAPLER 60 DA VINCI SURE FORM (STAPLE) ×1
STAPLER 60 SUREFORM DVNC (STAPLE) ×1 IMPLANT
STAPLER CANNULA SEAL DVNC XI (STAPLE) ×1 IMPLANT
STAPLER CANNULA SEAL XI (STAPLE) ×1
STAPLER RELOAD 2.5X60 WHITE (STAPLE) ×4
STAPLER RELOAD 2.5X60 WHT DVNC (STAPLE) ×4
STAPLER RELOAD 3.5X60 BLU DVNC (STAPLE) ×2
STAPLER RELOAD 3.5X60 BLUE (STAPLE) ×2
SUT ETHIBOND 0 36 GRN (SUTURE) IMPLANT
SUT MNCRL AB 4-0 PS2 18 (SUTURE) ×2 IMPLANT
SUT VICRYL 0 TIES 12 18 (SUTURE) ×1 IMPLANT
SYR 10ML ECCENTRIC (SYRINGE) IMPLANT
SYR 20ML LL LF (SYRINGE) ×1 IMPLANT
TOWEL OR 17X26 10 PK STRL BLUE (TOWEL DISPOSABLE) ×1 IMPLANT
TRAY FOLEY MTR SLVR 16FR STAT (SET/KITS/TRAYS/PACK) IMPLANT
TROCAR ADV FIXATION 5X100MM (TROCAR) IMPLANT
TROCAR XCEL NON-BLD 5MMX100MML (ENDOMECHANICALS) ×1 IMPLANT
TUBE CALIBRATION LAPBAND (TUBING) IMPLANT
TUBING INSUFFLATION 10FT LAP (TUBING) ×1 IMPLANT

## 2022-09-22 NOTE — Op Note (Signed)
   Surgeon: Kaylyn Lim, MD, FACS  Asst:  Greer Pickerel, MD, FACS   22 September 2022 Anes:  General endotracheal  Procedure: Enterolysis x 1 hour, Robotic sleeve gastrectomy and upper endoscopy  Diagnosis: Morbid obesity  Complications: None except extra time to take down adhesions because of prior colectomy and repair of hernia with mesh ([place])  EBL:   20 cc  Description of Procedure:  The patient was take to OR 5 and given general anesthesia.  The abdomen was prepped with Chloroprep and draped sterilely.  A timeout was performed.  Access to the abdomen was achieved with a 5 mm Optiview through the left upper quadrant.  The patient had a previous midline incision for a descending colectomy and subsequent repair of ventral hernia with pariah [place] mesh .  She had a lot of adhesions to the upper midline and the procedure would not be possible without significant dissection.  We began with placing a second port and then gradually using scissors and harmonic to take down these adhesions of the omentum to the prior Parietex mesh carrying this down below the umbilicus.  Took about an hour of enterolysis time to take down adhesions and and add ports.  Also placed tap blocks laterally on both the right and left side and also in the upper midline for the [facility] retractor eventually.   An area of bleeding omentum was controlled with the Harmonic after initial failure of 5 mm clips. We looked carefully and did not see any bowel involved with the midline only omentum.   When the ports were placed for the with robotic trocars we docked placing the for bipolar in the right side through a 12 followed by the camera then a cellular and then on the left side the fenestrated to pop.  These in the stomach and been decompressed with an NG tube.  We had placed the [facility] retractor in worked that around the robot.  The robot was docked and the camera was targeted and then following docking and placement of the  instruments we began taking down the short gastrics going up along the greater curvature up to taking it down off the spleen.  Bleeding was controlled.  And although somewhat tedious near the spleen we took this down without hemorrhage.  We carried it distally down below the crows foot about 6 cm from the pylorus.  When this was completed we introduced a blue load through the 12 mm port and began with 2 firings of the blue loads without bleeding transition to white loads for the remainder of the sleeve.  This was [name] with the VISI G and placed on suction.  When completed we insufflated and examined the pouch and then I performed endoscopy which showed no evidence of hiatal hernia and the sleeve had symmetry of a cylinder and carried down into the antrum and the pylorus was readily visible.  The stomach was extracted through the 12 mm port and that opening was closed with a PMI and 0 Vicryl.  Abdomen was surveyed and everything appeared to be in order.  The abdomen was deflated after removing the [facility] and then closing the incisions with 4-0 Monocryl and Dermabond.Mary [name], Mary [name], Mary [name], [name] Jeb Levering

## 2022-09-22 NOTE — Interval H&P Note (Signed)
History and Physical Interval Note:  09/22/2022 7:29 AM  Mary [name]  has presented today for surgery, with the diagnosis of MORBID OBESITY.  The various methods of treatment have been discussed with the patient and family. After consideration of risks, benefits and other options for treatment, the patient has consented to  Procedure(s): XI ROBOTIC SLEEVE GASTRECTOMY (N/A) UPPER GI ENDOSCOPY (N/A) as a surgical intervention.  The patient's history has been reviewed, patient examined, no change in status, stable for surgery.  I have reviewed the patient's chart and labs.  Questions were answered to the patient's satisfaction.     Pedro Earls

## 2022-09-22 NOTE — Anesthesia Preprocedure Evaluation (Addendum)
Anesthesia Evaluation  Patient identified by MRN, date of birth, ID band Patient awake    Reviewed: Allergy & Precautions, H&P , NPO status , Patient's Chart, lab work & pertinent test results  Airway Mallampati: II  TM Distance: >3 FB Neck ROM: Full    Dental  (+) Edentulous Upper   Pulmonary sleep apnea    Pulmonary exam normal breath sounds clear to auscultation       Cardiovascular hypertension, Pt. on medications Normal cardiovascular exam Rhythm:Regular Rate:Normal     Neuro/Psych  Headaches  negative psych ROS   GI/Hepatic Neg liver ROS,GERD  ,,  Endo/Other  negative endocrine ROS    Renal/GU Renal InsufficiencyRenal disease  negative genitourinary   Musculoskeletal negative musculoskeletal ROS (+)    Abdominal  (+) + obese  Peds negative pediatric ROS (+)  Hematology negative hematology ROS (+)   Anesthesia Other Findings   Reproductive/Obstetrics negative OB ROS                             Anesthesia Physical Anesthesia Plan  ASA: 2  Anesthesia Plan: General   Post-op Pain Management: Dilaudid IV, Ketamine IV*, Lidocaine infusion* and Ofirmev IV (intra-op)*   Induction: Intravenous  PONV Risk Score and Plan: 3 and Ondansetron, Dexamethasone and Midazolam  Airway Management Planned: Oral ETT  Additional Equipment:   Intra-op Plan:   Post-operative Plan: Extubation in OR  Informed Consent: I have reviewed the patients History and Physical, chart, labs and discussed the procedure including the risks, benefits and alternatives for the proposed anesthesia with the patient or authorized representative who has indicated his/her understanding and acceptance.     Dental advisory given  Plan Discussed with: CRNA  Anesthesia Plan Comments:        Anesthesia Quick Evaluation

## 2022-09-22 NOTE — Discharge Instructions (Signed)
GASTRIC BYPASS / SLEEVE  Home Care Instructions  These instructions are to help you care for yourself when you go home.  Call: If you have any problems. Call [phone] and ask for the surgeon on call If you have an emergency related to your surgery please use the ER at [place].  Tell the ER staff that you are a new post-op gastric bypass or gastric sleeve patient   Signs and symptoms to report: Severe vomiting or nausea If you cannot handle clear liquids for longer than 1 day, call your surgeon  Abdominal pain which does not get better after taking your pain medication Fever greater than 100.4 F and chills Heart rate over 100 beats a minute Trouble breathing Chest pain  Redness, swelling, drainage, or foul odor at incision (surgical) sites  If your incisions open or pull apart Swelling or pain in calf (lower leg) Diarrhea (Loose bowel movements that happen often), frequent watery, uncontrolled bowel movements Constipation, (no bowel movements for 3 days) if this happens:  Take Milk of Magnesia, 2 tablespoons by mouth, 3 times a day for 2 days if needed Stop taking Milk of Magnesia once you have had a bowel movement Call your doctor if constipation continues Or Take Miralax  (instead of Milk of Magnesia) following the label instructions Stop taking Miralax once you have had a bowel movement Call your doctor if constipation continues Anything you think is "abnormal for you"   Normal side effects after surgery: Unable to sleep at night or unable to concentrate Irritability Being tearful (crying) or depressed These are common complaints, possibly related to your anesthesia, stress of surgery and change in lifestyle, that usually go away a few weeks after surgery.  If these feelings continue, call your medical doctor.  Wound Care: You may have surgical glue, steri-strips, or staples over your incisions after surgery Surgical glue:  Looks like a clear film over your incisions  and will wear off a little at a time Steri-strips : Adhesive strips of tape over your incisions. You may notice a yellowish color on the skin under the steri-strips. This is used to make the   steri-strips stick better. Do not pull the steri-strips off - let them fall off Staples: Staples may be removed before you leave the hospital If you go home with staples, call [facility] at for an appointment with your surgeon's nurse to have staples removed 10 days after surgery, [phone] Showering: You may shower two (2) days after your surgery unless your surgeon tells you differently Wash gently around incisions with warm soapy water, rinse well, and gently pat dry  If you have a drain (tube from your incision), you may need someone to hold this while you shower  No tub baths until staples are removed and incisions are healed     Medications: Medications should be liquid or crushed if larger than the size of a dime Extended release pills (medication that releases a little bit at a time through the day) should not be crushed Depending on the size and number of medications you take, you may need to space (take a few throughout the day)/change the time you take your medications so that you do not over-fill your pouch (smaller stomach) Make sure you follow-up with your primary care physician to make medication changes needed during rapid weight loss and life-style changes If you have diabetes, follow up with the doctor that orders your diabetes medication(s) within one week after surgery and check   your blood sugar regularly. Do not drive while taking narcotics (pain medications) DO NOT take NSAID'S (Examples of NSAID's include ibuprofen, naproxen)  Diet:                    First 2 Weeks  You will see the nutritionist about two (2) weeks after your surgery. The nutritionist will increase the types of foods you can eat if you are handling liquids well: If you have severe vomiting or nausea  and cannot handle clear liquids lasting longer than 1 day, call your surgeon  Protein Shake Drink at least 2 ounces of shake 5-6 times per day Each serving of protein shakes (usually 8 - 12 ounces) should have a minimum of:  15 grams of protein  And no more than 5 grams of carbohydrate  Goal for protein each day: Men = 80 grams per day Women = 60 grams per day Protein powder may be added to fluids such as non-fat milk or Lactaid milk or Soy milk (limit to 35 grams added protein powder per serving)  Hydration Slowly increase the amount of water and other clear liquids as tolerated (See Acceptable Fluids) Slowly increase the amount of protein shake as tolerated   Sip fluids slowly and throughout the day May use sugar substitutes in small amounts (no more than 6 - 8 packets per day; i.e. Splenda)  Fluid Goal The first goal is to drink at least 8 ounces of protein shake/drink per day (or as directed by the nutritionist);  See handout from pre-op Bariatric Education Class for examples of protein shake/drink.   Slowly increase the amount of protein shake you drink as tolerated You may find it easier to slowly sip shakes throughout the day It is important to get your proteins in first Your fluid goal is to drink 64 - 100 ounces of fluid daily It may take a few weeks to build up to this 32 oz (or more) should be clear liquids  And  32 oz (or more) should be full liquids (see below for examples) Liquids should not contain sugar, caffeine, or carbonation  Clear Liquids: Water or Sugar-free flavored water (i.e. Fruit H2O, Propel) Decaffeinated coffee or tea (sugar-free) Paschal Blanton Lite, Wyler's Lite, Minute Maid Lite Sugar-free Jell-O Bouillon or broth Sugar-free Popsicle:   *Less than 20 calories each; Limit 1 per day  Full Liquids: Protein Shakes/Drinks + 2 choices per day of other full liquids Full liquids must be: No More Than 12 grams of Carbs per serving  No More Than 3 grams of Fat  per serving Strained low-fat cream soup Non-Fat milk Fat-free Lactaid Milk Sugar-free yogurt (Dannon Lite & Fit, Greek yogurt)      Vitamins and Minerals Start 1 day after surgery unless otherwise directed by your surgeon Bariatric Specific Complete Multivitamins Chewable Calcium Citrate with Vitamin D-3 (Example: 3 Chewable Calcium Plus 600 with Vitamin D-3) Take 500 mg three (3) times a day for a total of 1500 mg each day Do not take all 3 doses of calcium at one time as it may cause constipation, and you can only absorb 500 mg  at a time  Do not mix multivitamins containing iron with calcium supplements; take 2 hours apart  Menstruating women and those at risk for anemia (a blood disease that causes weakness) may need extra iron Talk with your doctor to see if you need more iron If you need extra iron: Total daily Iron recommendation (including Vitamins) is 50 to 100   mg Iron/day Do not stop taking or change any vitamins or minerals until you talk to your nutritionist or surgeon Your nutritionist and/or surgeon must approve all vitamin and mineral supplements   Activity and Exercise: It is important to continue walking at home.  Limit your physical activity as instructed by your doctor.  During this time, use these guidelines: Do not lift anything greater than ten (10) pounds for at least two (2) weeks Do not go back to work or drive until Engineer, production says you can You may have sex when you feel comfortable  It is VERY important for female patients to use a reliable birth control method; fertility often increases after surgery  Do not get pregnant for at least 18 months Start exercising as soon as your doctor tells you that you can Make sure your doctor approves any physical activity Start with a simple walking program Walk 5-15 minutes each day, 7 days per week.  Slowly increase until you are walking 30-45 minutes per day Consider joining our [facility] program. [phone] or email  [email or web address]   Special Instructions Things to remember:  Use your CPAP when sleeping if this applies to you, do not stop the use of CPAP unless directed by physician after a sleep study [facility] has a free Bariatric Surgery Support Group that meets monthly, the 3rd Thursday, 6 pm.  Please review discharge information for date and location of this meeting. It is very important to keep all follow up appointments with your surgeon, nutritionist, primary care physician, and behavioral health practitioner After the first year, please follow up with your bariatric surgeon and nutritionist at least once a year in order to maintain best weight loss results   [facility]: [place]: [phone] Bariatric Nurse Coordinator: [phone]     Enoxaparin (Lovenox) Self- Injection Instructions Wash your hands with soap and water Find a spot on the left or right side of the abdomen  2 inches from umbilicus "belly button"- DO NOT INJECT INTO INCISIONS Make sure to rotate sites (Do not inject in the same spot twice in a row) Clean the spot with alcohol swab, LET DRY Pull cap straight off, do not twist (do not let needle touch anything)Ok to leave in air bubble Place syringe in dominant (writing) hand, take other hand to "pinch an inch" (same area that you cleaned with the alcohol swab) Insert the entire needle into the fold of skin at a 90-degree angle Press the plunger all the way down with your thumb while the other hand keeps "pinching" the skin (Make sure to get all the medicine) Pull the needle straight out, then let go of the skin To activate the safety shield, hold the plunger away from yourself (and anyone else if necessary) Press with your thumb until you hear a "click" and the safety shield activates Place the used syringe and cap into the sharps disposal container.

## 2022-09-22 NOTE — Progress Notes (Signed)
Discussed QI "Goals for Discharge" document with patient including ambulation in halls, Incentive Spirometry use every hour, and oral care.  Also discussed pain and nausea control.  BSTOP education provided including BSTOP information guide, "Guide for Pain Management after your Bariatric Procedure".  Diet progression education provided including "Bariatric Surgery Post-Op Food Plan Phase 1: Liquids".  Questions answered.  Will continue to partner with bedside RN and follow up with patient per protocol.

## 2022-09-22 NOTE — Progress Notes (Signed)
PHARMACY CONSULT FOR:  Risk Assessment for Post-Discharge VTE Following Bariatric Surgery  Post-Discharge VTE Risk Assessment: This patient's probability of 30-day post-discharge VTE is increased due to the factors marked: x Sleeve gastrectomy   Liver disorder (transplant, cirrhosis, or nonalcoholic steatohepatitis)  x Hx of VTE   Hemorrhage requiring transfusion   GI perforation, leak, or obstruction   ====================================================    Female    Age >/=60 years    BMI >/=50 kg/m2    CHF    Dyspnea at Rest    Paraplegia  x  Non-gastric-band surgery    Operation Time >/=3 hr    Return to OR     Length of Stay >/= 3 d   Hypercoagulable condition   Significant venous stasis      Predicted probability of 30-day post-discharge VTE: - 0.16%  Other patient-specific factors to consider: - Pt with two listed risk factors for portomesenteric vein thrombosis: Hx of DVT and sleeve gastrectomy   Recommendation for Discharge: Enoxaparin 40 mg [place] q12h x 30 days post-discharge      Mary [name] is a 61 y.o. female who underwent  sleeve gastrectomy on 09/22/2022    Case start: 0810 Case end: 1042   Allergies  Allergen Reactions   Tylenol [Acetaminophen] Hives and Itching    Patient Measurements: Height: 5' 7.5" (171.5 cm) Weight: 116.6 kg (257 lb) IBW/kg (Calculated) : 62.75 Body mass index is 39.66 kg/m.  Recent Labs    09/22/22 1110  WBC 10.8*  HGB 13.3  HCT 42.6  PLT 167  CREATININE 1.41*  ALBUMIN 3.6  PROT 6.7  AST 52*  ALT 52*  ALKPHOS 77  BILITOT 0.6   Estimated Creatinine Clearance: 55.8 mL/min (A) (by C-G formula based on SCr of 1.41 mg/dL (H)).    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 w/prolonged cold sensitivity--Oxaliplatin related   Pneumothorax 07/19/2011   Bilateral   Pre-diabetes    Renal cysts, acquired, bilateral    Sleep apnea    does not use CPAP on regular basis     Facility-Administered Medications Prior to Admission  Medication Dose Route Frequency Provider Last Rate Last Admin   cyanocobalamin ((VITAMIN B-12)) injection 1,000 mcg  1,000 mcg Intramuscular Once Marrian Salvage, FNP       cyanocobalamin ((VITAMIN B-12)) injection 1,000 mcg  1,000 mcg Intramuscular Q30 days Marrian Salvage, FNP   1,000 mcg at 05/14/22 1509   Medications Prior to Admission  Medication Sig Dispense Refill Last Dose   ACCU-CHEK GUIDE test strip USE   TO CHECK GLUCOSE UP TO 4 TIMES DAILY 100 each 0 Past Week   Accu-Chek Softclix Lancets lancets USE   TO CHECK GLUCOSE UP TO 4 TIMES DAILY 100 each 0 Past Week   amLODipine (NORVASC) 5 MG tablet Take 1 tablet (5 mg total) by mouth daily. 90 tablet 0 09/22/2022   blood glucose meter kit and supplies KIT Dispense based on patient and insurance  preference. Use up to four times daily as directed. 1 each 0 Past Month   gabapentin (NEURONTIN) 300 MG capsule TAKE 1 CAPSULE BY MOUTH THREE TIMES DAILY 90 capsule 0 09/22/2022   IRON, FERROUS SULFATE, PO Take 1 tablet by mouth daily.   Past Week   Oxycodone HCl 10 MG TABS Take 10 mg by mouth 4 (four) times daily as needed (pain.).   09/21/2022 at 1500   pantoprazole (PROTONIX) 40 MG tablet Take 40 mg by mouth 2 (two) times daily.   09/22/2022   sucralfate (CARAFATE) 1 g tablet Take 1 tablet (1 g total) by mouth with breakfast, with lunch, and with evening meal for 7 days. (Patient not taking: Reported on 07/10/2022) 21 tablet 0        Royetta Asal, PharmD, BCPS 09/22/2022 1:08 PM

## 2022-09-22 NOTE — Anesthesia Postprocedure Evaluation (Signed)
Anesthesia Post Note  Patient: Mary [name]  Procedure(s) Performed: XI ROBOTIC SLEEVE GASTRECTOMY UPPER GI ENDOSCOPY     Patient location during evaluation: PACU Anesthesia Type: General Level of consciousness: awake and alert Pain management: pain level controlled Vital Signs Assessment: post-procedure vital signs reviewed and stable Respiratory status: spontaneous breathing, nonlabored ventilation and respiratory function stable Cardiovascular status: blood pressure returned to baseline and stable Postop Assessment: no apparent nausea or vomiting Anesthetic complications: no   No notable events documented.  Last Vitals:  Vitals:   09/22/22 1200 09/22/22 1230  BP: (!) 153/85 (!) 142/77  Pulse: 73 70  Resp: 17 16  Temp:  (!) 36.3 C  SpO2: 92% 100%    Last Pain:  Vitals:   09/22/22 1230  TempSrc: Oral  PainSc:                  Lynda Rainwater

## 2022-09-22 NOTE — Transfer of Care (Signed)
Immediate Anesthesia Transfer of Care Note  Patient: Mary [name]  Procedure(s) Performed: XI ROBOTIC SLEEVE GASTRECTOMY UPPER GI ENDOSCOPY  Patient Location: PACU  Anesthesia Type:General  Level of Consciousness: awake, alert , and oriented  Airway & Oxygen Therapy: Patient Spontanous Breathing and Patient connected to face mask oxygen  Post-op Assessment: Report given to RN and Post -op Vital signs reviewed and stable  Post vital signs: Reviewed and stable  Last Vitals:  Vitals Value Taken Time  BP 161/91 09/22/22 1054  Temp    Pulse 67 09/22/22 1056  Resp 22 09/22/22 1056  SpO2 100 % 09/22/22 1056  Vitals shown include unvalidated device data.  Last Pain:  Vitals:   09/22/22 0612  TempSrc:   PainSc: 0-No pain         Complications: No notable events documented.

## 2022-09-22 NOTE — Anesthesia Procedure Notes (Signed)
Procedure Name: Intubation Date/Time: 09/22/2022 7:41 AM  Performed by: Maxwell Caul, CRNAPre-anesthesia Checklist: Patient identified, Emergency Drugs available, Suction available and Patient being monitored Patient Re-evaluated:Patient Re-evaluated prior to induction Oxygen Delivery Method: Circle system utilized Preoxygenation: Pre-oxygenation with 100% oxygen Induction Type: IV induction Ventilation: Mask ventilation without difficulty Laryngoscope Size: Mac and 4 Grade View: Grade I Tube type: Oral Tube size: 7.5 mm Number of attempts: 1 Airway Equipment and Method: Stylet Placement Confirmation: ETT inserted through vocal cords under direct vision, positive ETCO2 and breath sounds checked- equal and bilateral Secured at: 21 cm Tube secured with: Tape Dental Injury: Teeth and Oropharynx as per pre-operative assessment

## 2022-09-22 NOTE — TOC Benefit Eligibility Note (Signed)
Patient Teacher, English as a foreign language completed.    The patient is currently admitted and upon discharge could be taking enoxaparin (Lovenox) 40 mg/0.4 ml.  The current 30 day co-pay is $70.00.   The patient is insured through [place], [place] Patient Advocate Specialist [place] Patient Advocate Team Direct Number: [phone]  Fax: [phone]

## 2022-09-23 ENCOUNTER — Encounter (HOSPITAL_COMMUNITY): Payer: Self-pay | Admitting: Surgery

## 2022-09-23 LAB — CBC WITH DIFFERENTIAL/PLATELET
Abs Immature Granulocytes: 0.08 10*3/uL — ABNORMAL HIGH (ref 0.00–0.07)
Basophils Absolute: 0 10*3/uL (ref 0.0–0.1)
Basophils Relative: 0 %
Eosinophils Absolute: 0 10*3/uL (ref 0.0–0.5)
Eosinophils Relative: 0 %
HCT: 41.3 % (ref 36.0–46.0)
Hemoglobin: 12.9 g/dL (ref 12.0–15.0)
Immature Granulocytes: 1 %
Lymphocytes Relative: 8 %
Lymphs Abs: 1.2 10*3/uL (ref 0.7–4.0)
MCH: 22.2 pg — ABNORMAL LOW (ref 26.0–34.0)
MCHC: 31.2 g/dL (ref 30.0–36.0)
MCV: 71 fL — ABNORMAL LOW (ref 80.0–100.0)
Monocytes Absolute: 0.9 10*3/uL (ref 0.1–1.0)
Monocytes Relative: 6 %
Neutro Abs: 13.8 10*3/uL — ABNORMAL HIGH (ref 1.7–7.7)
Neutrophils Relative %: 85 %
Platelets: 172 10*3/uL (ref 150–400)
RBC: 5.82 MIL/uL — ABNORMAL HIGH (ref 3.87–5.11)
RDW: 18 % — ABNORMAL HIGH (ref 11.5–15.5)
WBC: 16 10*3/uL — ABNORMAL HIGH (ref 4.0–10.5)
nRBC: 0 % (ref 0.0–0.2)

## 2022-09-23 LAB — GLUCOSE, CAPILLARY
Glucose-Capillary: 128 mg/dL — ABNORMAL HIGH (ref 70–99)
Glucose-Capillary: 134 mg/dL — ABNORMAL HIGH (ref 70–99)
Glucose-Capillary: 113 mg/dL — ABNORMAL HIGH (ref 70–99)
Glucose-Capillary: 112 mg/dL — ABNORMAL HIGH (ref 70–99)
Glucose-Capillary: 116 mg/dL — ABNORMAL HIGH (ref 70–99)

## 2022-09-23 LAB — SURGICAL PATHOLOGY

## 2022-09-23 MED ORDER — ENOXAPARIN (LOVENOX) PATIENT EDUCATION KIT: PACK | Freq: Once | Status: DC

## 2022-09-23 MED ORDER — ENOXAPARIN (LOVENOX) PATIENT EDUCATION KIT
PACK | Freq: Once | Status: AC
  Filled 2022-09-23: qty 1

## 2022-09-23 NOTE — TOC Initial Note (Signed)
Transition of Care [facility]) - Initial/Assessment Note    Patient Details  Name: Mary [name] MRN: [id number] Date of Birth: 1961-07-24  [facility] CM/SW Contact:    Angelita Ingles, RN Phone [phone]  09/23/2022, 8:15 AM  Clinical Narrative:                  Transition of Care [facility]) Screening Note   Patient Details  Name: Mary [name] Date of Birth: 14-Mar-1961   Transition of Care [facility]) CM/SW Contact:    Angelita Ingles, RN Phone Number: 09/23/2022, 8:15 AM    Transition of Care Department (TOC) has reviewed patient and no TOC needs have been identified at this time. We will continue to monitor patient advancement through interdisciplinary progression rounds. If new patient transition needs arise, please place a TOC consult.          Patient Goals and CMS Choice        Expected Discharge Plan and Services                                                Prior Living Arrangements/Services                       Activities of Daily Living Home Assistive Devices/Equipment: Contact lenses, Dentures (specify type) ADL Screening (condition at time of admission) Patient's cognitive ability adequate to safely complete daily activities?: Yes Is the patient deaf or have difficulty hearing?: No Does the patient have difficulty seeing, even when wearing glasses/contacts?: No Does the patient have difficulty concentrating, remembering, or making decisions?: No Patient able to express need for assistance with ADLs?: Yes Does the patient have difficulty dressing or bathing?: No Independently performs ADLs?: Yes (appropriate for developmental age) Does the patient have difficulty walking or climbing stairs?: Yes Weakness of Legs: None Weakness of Arms/Hands: None  Permission Sought/Granted                  Emotional Assessment              Admission diagnosis:  Status post sleeve gastrectomy [Z90.3] Patient Active  Problem List   Diagnosis Date Noted   Status post sleeve gastrectomy 09/22/2022   Left trimalleolar fracture, closed, initial encounter 12/18/2020   Left ankle pain 12/18/2020   Labial cyst 03/05/2018   Chest pain 05/30/2017   Chronic nonintractable headache 05/04/2017   Ankle edema, bilateral 05/04/2017   Cellulitis 04/04/2016   Abnormal x-ray 04/04/2016   Hearing loss due to cerumen impaction 01/14/2016   Impaired fasting blood sugar 01/14/2016   Plantar fasciitis, right 11/22/2015   Right calcaneal fracture 08/15/2015   OSA (obstructive sleep apnea) 09/26/2014   Peripheral neuropathy due to chemotherapy ([place]) 06/05/2014   Obesity 06/05/2014   history of DVT (deep venous thrombosis) 02/20/2014   GERD (gastroesophageal reflux disease) 02/20/2014   Hernia 09/22/2012   Iron deficiency anemia, unspecified 08/30/2011   Colon cancer ([place]) 08/29/2011   Cancer of descending colon ([place]) 05/12/2011   PCP:  Marrian Salvage, [place] [facility]:   [id number] [place] ([place]), [place] [address] [place] ([place]) [place] [address] Phone: [phone] Fax: [phone]     Social Determinants of Health (SDOH) Interventions Food Insecurity Interventions: Intervention Not Indicated Housing Interventions: Patient Refused Transportation Interventions:  Intervention Not Indicated Utilities Interventions: Patient Refused  Readmission Risk Interventions     No data to display

## 2022-09-23 NOTE — Progress Notes (Signed)
Patient alert and oriented, Post op day 1.  Provided support and encouragement.  Encouraged  toileting, ambulation, and small sips of liquids.  All questions answered.  Will continue to monitor.

## 2022-09-23 NOTE — Progress Notes (Signed)
Patient alert and oriented, pain is controlled. Patient is tolerating fluids, will advance to protein shake today, patient is tolerating well. Reviewed Gastric sleeve discharge instructions with patient and patient is able to articulate understanding. Provided information on BELT program, Support Group and [facility] outpatient pharmacy. All questions answered. 24hr fluid recall is 320 mL per hydration protocol, bariatric nurse coordinator to make follow-up phone call within one week.

## 2022-09-23 NOTE — Progress Notes (Signed)
Patient ID: Mary [name], female   DOB: 07/14/1961, 61 y.o.   MRN: [id number] [facility] Surgery Progress Note:   1 Day Post-Op   THE PLAN  She will get Lovenox kit.   Hopeful discharge tomorrow  Subjective: Mental status is clear.  Complaints sore. Objective: Vital signs in last 24 hours: Temp:  [97.4 F (36.3 C)-98.4 F (36.9 C)] 98.3 F (36.8 C) (11/14 1147) Pulse Rate:  [59-79] 59 (11/14 1147) Resp:  [14-17] 16 (11/14 0949) BP: (131-165)/(69-92) 131/69 (11/14 1147) SpO2:  [97 %-100 %] 100 % (11/14 1147)  Intake/Output from previous day: 11/13 0701 - 11/14 0700 In: 3092.9 [P.O.:260; I.V.:2732.9; IV Piggyback:100] Out: 3775 [Urine:3750; Blood:25] Intake/Output this shift: Total I/O In: 459.4 [P.O.:60; I.V.:399.4] Out: 900 [Urine:900]  Physical Exam: Work of breathing is normal.  Incisions ok  Lab Results:  Results for orders placed or performed during the hospital encounter of 09/22/22 (from the past 48 hour(s))  Type and screen     Status: None   Collection Time: 09/22/22  6:21 AM  Result Value Ref Range   ABO/RH(D) O POS    Antibody Screen NEG    Sample Expiration      09/25/2022,2359 Performed at [facility], [place] [address], [place], [place] [address]   CBC with Differential/Platelet     Status: Abnormal   Collection Time: 09/22/22 11:10 AM  Result Value Ref Range   WBC 10.8 (H) 4.0 - 10.5 K/uL   RBC 5.93 (H) 3.87 - 5.11 MIL/uL   Hemoglobin 13.3 12.0 - 15.0 g/dL   HCT 42.6 36.0 - 46.0 %   MCV 71.8 (L) 80.0 - 100.0 fL   MCH 22.4 (L) 26.0 - 34.0 pg   MCHC 31.2 30.0 - 36.0 g/dL   RDW 17.9 (H) 11.5 - 15.5 %   Platelets 167 150 - 400 K/uL   nRBC 0.0 0.0 - 0.2 %   Neutrophils Relative % 82 %   Neutro Abs 8.8 (H) 1.7 - 7.7 K/uL   Lymphocytes Relative 14 %   Lymphs Abs 1.5 0.7 - 4.0 K/uL   Monocytes Relative 3 %   Monocytes Absolute 0.3 0.1 - 1.0 K/uL   Eosinophils Relative 0 %   Eosinophils Absolute 0.0 0.0 - 0.5 K/uL   Basophils  Relative 0 %   Basophils Absolute 0.0 0.0 - 0.1 K/uL   Immature Granulocytes 1 %   Abs Immature Granulocytes 0.09 (H) 0.00 - 0.07 K/uL    Comment: Performed at [facility], [place] [address], [place], [place] [address]  Comprehensive metabolic panel     Status: Abnormal   Collection Time: 09/22/22 11:10 AM  Result Value Ref Range   Sodium 140 135 - 145 mmol/L   Potassium 3.6 3.5 - 5.1 mmol/L   Chloride 104 98 - 111 mmol/L   CO2 23 22 - 32 mmol/L   Glucose, Bld 179 (H) 70 - 99 mg/dL    Comment: Glucose reference range applies only to samples taken after fasting for at least 8 hours.   BUN 18 8 - 23 mg/dL   Creatinine, Ser 1.41 (H) 0.44 - 1.00 mg/dL   Calcium 8.9 8.9 - 10.3 mg/dL   Total Protein 6.7 6.5 - 8.1 g/dL   Albumin 3.6 3.5 - 5.0 g/dL   AST 52 (H) 15 - 41 U/L   ALT 52 (H) 0 - 44 U/L   Alkaline Phosphatase 77 38 - 126 U/L   Total Bilirubin 0.6 0.3 -  1.2 mg/dL   GFR, Estimated 42 (L) >60 mL/min    Comment: (NOTE) Calculated using the CKD-EPI Creatinine Equation (2021)    Anion gap 13 5 - 15    Comment: Performed at [facility], [place] [address] [place], [place] [address]  Hemoglobin and hematocrit, blood     Status: None   Collection Time: 09/22/22  4:33 PM  Result Value Ref Range   Hemoglobin 13.8 12.0 - 15.0 g/dL   HCT 44.9 36.0 - 46.0 %    Comment: Performed at [facility], [place] [address] [place], [place] [address]  Glucose, capillary     Status: Abnormal   Collection Time: 09/22/22  4:47 PM  Result Value Ref Range   Glucose-Capillary 174 (H) 70 - 99 mg/dL    Comment: Glucose reference range applies only to samples taken after fasting for at least 8 hours.  Glucose, capillary     Status: Abnormal   Collection Time: 09/22/22  8:01 PM  Result Value Ref Range   Glucose-Capillary 184 (H) 70 - 99 mg/dL    Comment: Glucose reference range applies only to samples taken after fasting for at least 8 hours.  Glucose,  capillary     Status: Abnormal   Collection Time: 09/22/22 11:22 PM  Result Value Ref Range   Glucose-Capillary 162 (H) 70 - 99 mg/dL    Comment: Glucose reference range applies only to samples taken after fasting for at least 8 hours.  Glucose, capillary     Status: Abnormal   Collection Time: 09/23/22  4:07 AM  Result Value Ref Range   Glucose-Capillary 128 (H) 70 - 99 mg/dL    Comment: Glucose reference range applies only to samples taken after fasting for at least 8 hours.  CBC with Differential     Status: Abnormal   Collection Time: 09/23/22  4:20 AM  Result Value Ref Range   WBC 16.0 (H) 4.0 - 10.5 K/uL   RBC 5.82 (H) 3.87 - 5.11 MIL/uL   Hemoglobin 12.9 12.0 - 15.0 g/dL   HCT 41.3 36.0 - 46.0 %   MCV 71.0 (L) 80.0 - 100.0 fL   MCH 22.2 (L) 26.0 - 34.0 pg   MCHC 31.2 30.0 - 36.0 g/dL   RDW 18.0 (H) 11.5 - 15.5 %   Platelets 172 150 - 400 K/uL   nRBC 0.0 0.0 - 0.2 %   Neutrophils Relative % 85 %   Neutro Abs 13.8 (H) 1.7 - 7.7 K/uL   Lymphocytes Relative 8 %   Lymphs Abs 1.2 0.7 - 4.0 K/uL   Monocytes Relative 6 %   Monocytes Absolute 0.9 0.1 - 1.0 K/uL   Eosinophils Relative 0 %   Eosinophils Absolute 0.0 0.0 - 0.5 K/uL   Basophils Relative 0 %   Basophils Absolute 0.0 0.0 - 0.1 K/uL   Immature Granulocytes 1 %   Abs Immature Granulocytes 0.08 (H) 0.00 - 0.07 K/uL    Comment: Performed at [facility], [place] [address], [place], [place] [address]  Glucose, capillary     Status: Abnormal   Collection Time: 09/23/22  7:21 AM  Result Value Ref Range   Glucose-Capillary 134 (H) 70 - 99 mg/dL    Comment: Glucose reference range applies only to samples taken after fasting for at least 8 hours.  Glucose, capillary     Status: Abnormal   Collection Time: 09/23/22 11:48 AM  Result Value Ref Range   Glucose-Capillary 113 (H)  70 - 99 mg/dL    Comment: Glucose reference range applies only to samples taken after fasting for at least 8 hours.     Radiology/Results: No results found.  Anti-infectives: Anti-infectives (From admission, onward)    Start     Dose/Rate Route Frequency Ordered Stop   09/22/22 0600  cefoTEtan (CEFOTAN) 2 g in sodium chloride 0.9 % 100 mL IVPB        2 g 200 mL/hr over 30 Minutes Intravenous On call to O.R. 09/22/22 0531 09/22/22 0744       Assessment/Plan: Problem List: Patient Active Problem List   Diagnosis Date Noted   Status post sleeve gastrectomy 09/22/2022   Left trimalleolar fracture, closed, initial encounter 12/18/2020   Left ankle pain 12/18/2020   Labial cyst 03/05/2018   Chest pain 05/30/2017   Chronic nonintractable headache 05/04/2017   Ankle edema, bilateral 05/04/2017   Cellulitis 04/04/2016   Abnormal x-ray 04/04/2016   Hearing loss due to cerumen impaction 01/14/2016   Impaired fasting blood sugar 01/14/2016   Plantar fasciitis, right 11/22/2015   Right calcaneal fracture 08/15/2015   OSA (obstructive sleep apnea) 09/26/2014   Peripheral neuropathy due to chemotherapy ([place]) 06/05/2014   Obesity 06/05/2014   history of DVT (deep venous thrombosis) 02/20/2014   GERD (gastroesophageal reflux disease) 02/20/2014   Hernia 09/22/2012   Iron deficiency anemia, unspecified 08/30/2011   Colon cancer ([place]) 08/29/2011   Cancer of descending colon ([place]) 05/12/2011    Taking water/shakes.  Advance as tolerated 1 Day Post-Op    LOS: 1 day   Matt B. Hassell Done, MD, [facility] [phone] to reach the surgeon on call.    09/23/2022 1:03 PM

## 2022-09-24 ENCOUNTER — Other Ambulatory Visit (HOSPITAL_COMMUNITY): Payer: Self-pay

## 2022-09-24 LAB — CBC WITH DIFFERENTIAL/PLATELET
Abs Immature Granulocytes: 0.04 10*3/uL (ref 0.00–0.07)
Basophils Absolute: 0 10*3/uL (ref 0.0–0.1)
Basophils Relative: 0 %
Eosinophils Absolute: 0.1 10*3/uL (ref 0.0–0.5)
Eosinophils Relative: 0 %
HCT: 38.3 % (ref 36.0–46.0)
Hemoglobin: 11.8 g/dL — ABNORMAL LOW (ref 12.0–15.0)
Immature Granulocytes: 0 %
Lymphocytes Relative: 18 %
Lymphs Abs: 2.1 10*3/uL (ref 0.7–4.0)
MCH: 21.9 pg — ABNORMAL LOW (ref 26.0–34.0)
MCHC: 30.8 g/dL (ref 30.0–36.0)
MCV: 70.9 fL — ABNORMAL LOW (ref 80.0–100.0)
Monocytes Absolute: 0.8 10*3/uL (ref 0.1–1.0)
Monocytes Relative: 7 %
Neutro Abs: 8.8 10*3/uL — ABNORMAL HIGH (ref 1.7–7.7)
Neutrophils Relative %: 75 %
Platelets: 193 10*3/uL (ref 150–400)
RBC: 5.4 MIL/uL — ABNORMAL HIGH (ref 3.87–5.11)
RDW: 17.9 % — ABNORMAL HIGH (ref 11.5–15.5)
WBC: 11.9 10*3/uL — ABNORMAL HIGH (ref 4.0–10.5)
nRBC: 0 % (ref 0.0–0.2)

## 2022-09-24 LAB — GLUCOSE, CAPILLARY
Glucose-Capillary: 107 mg/dL — ABNORMAL HIGH (ref 70–99)
Glucose-Capillary: 107 mg/dL — ABNORMAL HIGH (ref 70–99)
Glucose-Capillary: 120 mg/dL — ABNORMAL HIGH (ref 70–99)
Glucose-Capillary: 134 mg/dL — ABNORMAL HIGH (ref 70–99)

## 2022-09-24 MED ORDER — OXYCODONE HCL 5 MG PO TABS
5.0000 mg | ORAL_TABLET | Freq: Four times a day (QID) | ORAL | 0 refills | Status: DC | PRN
  Filled 2022-09-24: qty 20, 5d supply, fill #0

## 2022-09-24 MED ORDER — ONDANSETRON 4 MG PO TBDP
4.0000 mg | ORAL_TABLET | Freq: Four times a day (QID) | ORAL | 0 refills | Status: DC | PRN
  Filled 2022-09-24: qty 9, 2d supply, fill #0

## 2022-09-24 MED ORDER — ENOXAPARIN SODIUM 40 MG/0.4ML IJ SOSY
40.0000 mg | PREFILLED_SYRINGE | Freq: Two times a day (BID) | INTRAMUSCULAR | 0 refills | Status: DC
  Filled 2022-09-24: qty 24, 30d supply, fill #0

## 2022-09-24 NOTE — Progress Notes (Signed)
Mobility Specialist - Progress Note   09/24/22 1023  Mobility  Activity Ambulated with assistance in hallway  Level of Assistance Independent after set-up  Assistive Device  (IV Pole)  Distance Ambulated (ft) 500 ft  Activity Response Tolerated well  Mobility Referral Yes  $Mobility charge 1 Mobility   Pt received in bed and agreeable to mobility. No complaints during mobility. Pt to bed after session with all needs met.     [facility]

## 2022-09-24 NOTE — Plan of Care (Signed)
Patient is stable for discharge. Discharge instructions given, patient understood instructions. All questions answered. Patient is discharged to home with husband.

## 2022-09-24 NOTE — Discharge Summary (Signed)
Physician Discharge Summary  Patient ID: Mary [name] MRN: [id number] DOB/AGE: [birth year] 61 y.o.  PCP: Marrian Salvage, FNP  Admit date: 09/22/2022 Discharge date: 09/24/2022  Admission Diagnoses:  morbid obesity  Discharge Diagnoses:  same  Principal Problem:   Status post sleeve gastrectomy   Surgery:  enterolysis and robotic sleeve gastrectomy  Discharged Condition: improved  Hospital Course:   had surgery that took longer than usual because of her prior colon resection and ventral hernia repair with mesh that produced adhesions.  Robotic sleeve gastrectomy without issues.  Was discharged on Lovenox for PE risk  Consults: pharmacy  Significant Diagnostic Studies: none    Discharge Exam: Blood pressure 132/66, pulse (!) 53, temperature 98.5 F (36.9 C), temperature source Oral, resp. rate 16, height 5' 7.5" (1.715 m), weight 116.6 kg, SpO2 100 %. Incisions OK.    Disposition: Discharge disposition: 01-Home or Self Care       Discharge Instructions     Ambulate hourly while awake   Complete by: As directed    Call MD for:  difficulty breathing, headache or visual disturbances   Complete by: As directed    Call MD for:  persistant dizziness or light-headedness   Complete by: As directed    Call MD for:  persistant nausea and vomiting   Complete by: As directed    Call MD for:  redness, tenderness, or signs of infection (pain, swelling, redness, odor or green/yellow discharge around incision site)   Complete by: As directed    Call MD for:  severe uncontrolled pain   Complete by: As directed    Call MD for:  temperature >101 F   Complete by: As directed    Diet bariatric full liquid   Complete by: As directed    Incentive spirometry   Complete by: As directed    Perform hourly while awake      Allergies as of 09/24/2022       Reactions   Tylenol [acetaminophen] Hives, Itching        Medication List     STOP taking these medications     sucralfate 1 g tablet Commonly known as: Carafate       TAKE these medications    Accu-Chek Guide test strip Generic drug: glucose blood USE   TO CHECK GLUCOSE UP TO 4 TIMES DAILY   Accu-Chek Softclix Lancets lancets USE   TO CHECK GLUCOSE UP TO 4 TIMES DAILY   amLODipine 5 MG tablet Commonly known as: NORVASC Take 1 tablet (5 mg total) by mouth daily. Notes to patient: Monitor Blood Pressure Daily and keep a log for primary care physician.  You may need to make changes to your medications with rapid weight loss.      blood glucose meter kit and supplies Kit Dispense based on patient and insurance preference. Use up to four times daily as directed.   enoxaparin 40 MG/0.4ML injection Commonly known as: LOVENOX Inject 0.4 mLs (40 mg total) into the skin every 12 (twelve) hours.   gabapentin 300 MG capsule Commonly known as: NEURONTIN TAKE 1 CAPSULE BY MOUTH THREE TIMES DAILY   IRON (FERROUS SULFATE) PO Take 1 tablet by mouth daily.   ondansetron 4 MG disintegrating tablet Commonly known as: ZOFRAN-ODT Dissolve 1 tablet (4 mg total) by mouth every 6 (six) hours as needed for nausea or vomiting.   Oxycodone HCl 10 MG Tabs Take 10 mg by mouth 4 (four) times daily as needed (pain.). What changed: Another  medication with the same name was added. Make sure you understand how and when to take each.   oxyCODONE 5 MG immediate release tablet Commonly known as: Oxy IR/ROXICODONE Take 1 tablet (5 mg total) by mouth every 6 (six) hours as needed for severe pain. What changed: You were already taking a medication with the same name, and this prescription was added. Make sure you understand how and when to take each.   pantoprazole 40 MG tablet Commonly known as: PROTONIX Take 40 mg by mouth 2 (two) times daily.        Follow-up Information     Maczis, Carlena Hurl, [place]. Go on 10/16/2022.   Specialty: General Surgery Why: at 9am for Dr. Hassell Done. Please arrive 15 minutes  prior to your appointment time. Thank you. Contact information: [address] [place] [address] [phone]         Johnathan Hausen, MD. Go on 11/12/2022.   Specialty: General Surgery Why: at 10am in the [facility]. Please arrive 15 minutes prior to your appointment time. Thank you. Contact information: [address] [place] [address] [phone]                 Signed: Pedro Earls 09/24/2022, 10:48 PM

## 2022-09-26 ENCOUNTER — Telehealth (HOSPITAL_COMMUNITY): Payer: Self-pay | Admitting: *Deleted

## 2022-09-26 NOTE — Telephone Encounter (Signed)
1.  Tell me about your pain and pain management? Pt c/o right lower abdominal incisional pain with movement and exertion.  Pt states that she has been taking Tylenol. Discussed with patient to try and splint her abdomen with changing positions to assist with the discomfort.  Encouraged pt to try options and/or contact [facility] if still concerned.   2.  Let's talk about fluid intake.  How much total fluid are you taking in? Pt states that she is getting in at least 64oz of fluid including protein shakes, bottled water, and broth.  3.  How much protein have you taken in the last 2 days? Pt states she is meeting her goal of 60g of protein each day with the protein shakes.  4.  Have you had nausea?  Tell me about when have experienced nausea and what you did to help? Pt denies nausea.   5.  Has the frequency or color changed with your urine? Pt states that she is urinating "fine" with no changes in frequency or urgency.     6.  Tell me what your incisions look like? "Incisions look fine". Pt denies a fever, chills.  Pt states incisions are not swollen, open, or draining.  Pt encouraged to call [facility] if incisions change.   7.  Have you been passing gas? BM? Pt states that she has not had a BM.  Pt instructed to take either Miralax or MoM as instructed per "Gastric Bypass/Sleeve Discharge Home Care Instructions".  Pt to call surgeon's office if not able to have BM with medication.   8.  If a problem or question were to arise who would you call?  Do you know contact numbers for [place], [facility], and [facility]? Pt denies dehydration symptoms.  Pt can describe s/sx of dehydration.  Pt knows to call [facility] for surgical, [facility] for nutrition, and [place] for non-urgent questions or concerns.   9.  How has the walking going? Pt states she is walking around and able to be active without difficulty.   10. Are you still using your incentive spirometer?  If so, how often? Pt states that she is doing the I.S. "faithfully".   Pt  encouraged to use incentive spirometer, at least 10x every hour while awake until she sees the surgeon.  11.  How are your vitamins and calcium going?  How are you taking them? Pt states that she is taking her supplements and vitamins without difficulty.   LOVENOX: Pt states that she is taking the Lovenox injections without difficulty.  Reinforced education about proper hand hygiene, taking injections q12h and rotating injection sites.  Pt also instructed to monitor for unusual bruising and/or signs of bleeding.  Reminded patient that the first 30 days post-operatively are important for successful recovery.  Practice good hand hygiene, wearing a mask when appropriate (since optional in most places), and minimizing exposure to people who live outside of the home, especially if they are exhibiting any respiratory, GI, or illness-like symptoms.

## 2022-10-07 ENCOUNTER — Encounter: Payer: 59 | Attending: Family | Admitting: Skilled Nursing Facility1

## 2022-10-07 DIAGNOSIS — E669 Obesity, unspecified: Secondary | ICD-10-CM | POA: Insufficient documentation

## 2022-10-09 NOTE — Progress Notes (Signed)
2 Week Post-Operative Nutrition Class   Patient was seen on 10/07/22 for Post-Operative Nutrition education at the [facility].    Surgery date: 09/22/22 Surgery type: Sleeve Bowel Habits: Every day to every other day no complaints  Anthropometrics  Start weight at [facility]: 260.7 lbs (date: 01/21/2022) Height: 71 inches Today's weight: Pt declined  Clinical  Medical hx: sleep apnea, GERD, cancer of descending colon (some removed), anemia, anxiety, CKD, DM Medications: taking B12 monthly,  Labs: A1C 6.5, creatinine 1.56, GFR 35.85, albumin 3.4 Notable signs/symptoms: migraines, constipation Any previous deficiencies? Yes: iron and b12     The following the learning objectives were met by the patient during this course: Identifies Phase 3 (Soft, High Proteins) Dietary Goals and will begin from 2 weeks post-operatively to 2 months post-operatively Identifies appropriate sources of fluids and proteins  Identifies appropriate fat sources and healthy verses unhealthy fat types   States protein recommendations and appropriate sources post-operatively Identifies the need for appropriate texture modifications, mastication, and bite sizes when consuming solids Identifies appropriate fat consumption and sources Identifies appropriate multivitamin and calcium sources post-operatively Describes the need for physical activity post-operatively and will follow MD recommendations States when to call healthcare provider regarding medication questions or post-operative complications   Handouts given during class include: Phase 3A: Soft, High Protein Diet Handout Phase 3 High Protein Meals Healthy Fats   Follow-Up Plan: Patient will follow-up at [facility] in 6 weeks for 2 month post-op nutrition visit for diet advancement per MD.

## 2022-10-13 ENCOUNTER — Telehealth: Payer: Self-pay | Admitting: Dietician

## 2022-10-13 NOTE — Telephone Encounter (Signed)
RD called pt to verify fluid intake once starting soft, solid proteins 2 week post-bariatric surgery.   Daily Fluid intake: almost 64 ounces Daily Protein intake: 60 grams Bowel Habits: a little constipated and taking Miralax daily.   Concerns/issues:  no issues

## 2022-10-24 ENCOUNTER — Ambulatory Visit
Admission: RE | Admit: 2022-10-24 | Discharge: 2022-10-24 | Disposition: A | Discharge status: Home / Self Care | Payer: 59 | Source: Ambulatory Visit | Attending: Family | Admitting: Family

## 2022-10-24 DIAGNOSIS — Z1231 Encounter for screening mammogram for malignant neoplasm of breast: Secondary | ICD-10-CM

## 2022-10-31 ENCOUNTER — Other Ambulatory Visit: Payer: Self-pay | Admitting: Family

## 2022-11-04 ENCOUNTER — Other Ambulatory Visit: Payer: Self-pay | Admitting: Family

## 2022-11-04 ENCOUNTER — Telehealth: Payer: Self-pay | Admitting: Family

## 2022-11-04 MED ORDER — AMLODIPINE BESYLATE 5 MG PO TABS: 5.0000 mg | ORAL_TABLET | Freq: Every day | ORAL | 1 refills | Status: DC

## 2022-11-04 NOTE — Telephone Encounter (Signed)
Pt stated she is still taking Amlodipine. Pt also stated she hasn't had a B12 shot sense surgery and wanted to know when she can start them again.

## 2022-11-04 NOTE — Telephone Encounter (Signed)
Pt is aware and stated she has an appt with the weight loss surgeon on 11/12/2022.

## 2022-11-04 NOTE — Telephone Encounter (Signed)
Please find out if she is still taking Amlodipine. I know she had weight loss surgery so she may have been able to stop. Just need to know what to do with her refill.

## 2022-11-18 ENCOUNTER — Encounter: Payer: 59 | Attending: Surgery | Admitting: Skilled Nursing Facility1

## 2022-11-18 ENCOUNTER — Encounter: Payer: Self-pay | Admitting: Skilled Nursing Facility1

## 2022-11-18 VITALS — Ht 71.0 in | Wt 244.6 lb

## 2022-11-18 DIAGNOSIS — E119 Type 2 diabetes mellitus without complications: Secondary | ICD-10-CM

## 2022-11-18 DIAGNOSIS — N189 Chronic kidney disease, unspecified: Secondary | ICD-10-CM | POA: Diagnosis present

## 2022-11-18 DIAGNOSIS — E669 Obesity, unspecified: Secondary | ICD-10-CM

## 2022-11-18 NOTE — Progress Notes (Signed)
Bariatric Nutrition Follow-Up Visit Medical Nutrition Therapy    NUTRITION ASSESSMENT    Anthropometrics  Surgery date: 09/22/22 Surgery type: Sleeve Bowel Habits: Every day to every other day no complaints  Anthropometrics  Start weight at [facility]: 260.7 lbs (date: 01/21/2022) Height: 71 inches Today's weight: 244.6  Clinical  Medical hx: sleep apnea, GERD, cancer of descending colon (some removed), anemia, anxiety, CKD, DM Medications: taking B12 monthly,  Labs: A1C 6.5, creatinine 1.56, GFR 35.85, albumin 3.4 Notable signs/symptoms: migraines, constipation Any previous deficiencies? Yes: iron and b12   Body Composition Scale 11/18/2021  Current Body Weight 244.6  Total Body Fat % 42.2  Visceral Fat 13  Fat-Free Mass % 57.7   Total Body Water % 43.3  Muscle-Mass lbs 34.8  BMI 33.9  Body Fat Displacement          Torso  lbs 64         Left Leg  lbs 12.8         Right Leg  lbs 12.8         Left Arm  lbs 6.4         Right Arm   lbs 6.4      Lifestyle & Dietary Hx   Pt states she has tried some soda but realizes it tastes nasty.  Pt states things are going well and food decisions are easy now.  Pt states she would like to go to the gym 3 days a week but her husband is happy with two stating she is not to the point where she is happy to go to the gym by herself.   Estimated daily fluid intake: 70 oz Estimated daily protein intake: 60+ g Supplements: multi and calcium  Current average weekly physical activity: her and her husband go to the gym 2 times a week: treadmill: 20-30 minutes 3% incline 2.5 speed and ab machine and exercise at home: sit ups, leg lifts on the bed, calf raises  3-4 times a day 6-7 days a week  24-Hr Dietary Recall First Meal: protein shake or [place] bacon and eggs Snack:   Second Meal: hamburger patty Snack: granola bar Third Meal: pinto beans and green beans and salmon Snack:  Beverages: water + lemon, water, lemonade  Post-Op Goals/  Signs/ Symptoms Using straws: no Drinking while eating: no Chewing/swallowing difficulties: no Changes in vision: no Changes to mood/headaches: no Hair loss/changes to skin/nails: no Difficulty focusing/concentrating: no Sweating: no Limb weakness: no Dizziness/lightheadedness: no Palpitations: no  Carbonated/caffeinated beverages: no N/V/D/C/Gas: bowel movement every other day Abdominal pain: no Dumping syndrome: no    NUTRITION DIAGNOSIS  Overweight/obesity (-3.3) related to past poor dietary habits and physical inactivity as evidenced by completed bariatric surgery and following dietary guidelines for continued weight loss and healthy nutrition status.     NUTRITION INTERVENTION Nutrition counseling (C-1) and education (E-2) to facilitate bariatric surgery goals, including: The importance of consuming adequate calories as well as certain nutrients daily due to the body's need for essential vitamins, minerals, and fats The importance of daily physical activity and to reach a goal of at least 150 minutes of moderate to vigorous physical activity weekly (or as directed by their physician) due to benefits such as increased musculature and improved lab values The importance of intuitive eating specifically learning hunger-satiety cues and understanding the importance of learning a new body: The importance of mindful eating to avoid grazing behaviors  Creation of balanced meals within the context of CKD and DM Importance  of vegetables To have an overall healthy diet, adult men and women are recommended to consume anywhere from 2-3 cups of vegetables daily. Vegetables provide a wide range of vitamins and minerals such as vitamin A, vitamin C, potassium, and folic acid. According to the [facility] [employer], including fruit and vegetables daily may reduce the risk of cardiovascular disease, certain cancers, and other non-communicable diseases.  Handouts Provided Include  Melas ideas  sheet: processed vs non processed  Learning Style & Readiness for Change Teaching method utilized: Visual & Auditory  Demonstrated degree of understanding via: Teach Back  Readiness Level: action Barriers to learning/adherence to lifestyle change: none identified   RD's Notes for Next Visit Assess adherence to pt chosen goals    MONITORING & EVALUATION Dietary intake, weekly physical activity, body weight  Next Steps Patient is to follow-up in 3 months

## 2023-01-26 ENCOUNTER — Encounter (HOSPITAL_COMMUNITY): Payer: Self-pay

## 2023-01-26 ENCOUNTER — Ambulatory Visit
Admission: EM | Admit: 2023-01-26 | Discharge: 2023-01-26 | Disposition: A | Discharge status: Other Acute Inpatient Hospital | Payer: 59

## 2023-01-26 ENCOUNTER — Other Ambulatory Visit: Payer: Self-pay

## 2023-01-26 ENCOUNTER — Emergency Department (HOSPITAL_BASED_OUTPATIENT_CLINIC_OR_DEPARTMENT_OTHER): Payer: Worker's Compensation

## 2023-01-26 ENCOUNTER — Emergency Department (HOSPITAL_COMMUNITY)
Admission: EM | Admit: 2023-01-26 | Discharge: 2023-01-26 | Discharge status: Against Medical Advice | Payer: 59 | Attending: Emergency Medicine | Admitting: Emergency Medicine

## 2023-01-26 ENCOUNTER — Encounter: Payer: Self-pay | Admitting: Emergency Medicine

## 2023-01-26 ENCOUNTER — Emergency Department (HOSPITAL_COMMUNITY)
Admission: EM | Admit: 2023-01-26 | Discharge: 2023-01-26 | Disposition: A | Discharge status: Home / Self Care | Payer: Worker's Compensation | Attending: Emergency Medicine | Admitting: Emergency Medicine

## 2023-01-26 DIAGNOSIS — M25572 Pain in left ankle and joints of left foot: Secondary | ICD-10-CM

## 2023-01-26 DIAGNOSIS — M79662 Pain in left lower leg: Secondary | ICD-10-CM | POA: Diagnosis not present

## 2023-01-26 DIAGNOSIS — M25462 Effusion, left knee: Secondary | ICD-10-CM | POA: Insufficient documentation

## 2023-01-26 DIAGNOSIS — R600 Localized edema: Secondary | ICD-10-CM | POA: Diagnosis present

## 2023-01-26 DIAGNOSIS — M7989 Other specified soft tissue disorders: Secondary | ICD-10-CM

## 2023-01-26 DIAGNOSIS — R2242 Localized swelling, mass and lump, left lower limb: Secondary | ICD-10-CM

## 2023-01-26 DIAGNOSIS — M25562 Pain in left knee: Secondary | ICD-10-CM

## 2023-01-26 DIAGNOSIS — Z5321 Procedure and treatment not carried out due to patient leaving prior to being seen by health care provider: Secondary | ICD-10-CM | POA: Insufficient documentation

## 2023-01-26 DIAGNOSIS — Z85038 Personal history of other malignant neoplasm of large intestine: Secondary | ICD-10-CM | POA: Insufficient documentation

## 2023-01-26 NOTE — ED Provider Notes (Signed)
[place] [facility] Provider Note   CSN: [id number] Arrival date & time: 01/26/23  1704     History  Chief Complaint  Patient presents with   Leg Swelling    Mary [name] is a 62 y.o. female with a past medical history significant for iron deficiency anemia, GERD, history of colon cancer, previous history of DVT not currently any anticoagulants who presents to the ED due to left lower extremity edema x 1 week.  Patient has a remote left ankle injury 2 years ago.  She admits to chronic left lower extremity pain on chronic pain meds followed by pain management. No chest pain or shortness of breath.  No recent injury.  Patient has baseline numbness/tingling with no change over the past week.  No history of CHF. No fever or chills.  History obtained from patient and past medical records. No interpreter used during encounter.       Home Medications Prior to Admission medications   Medication Sig Start Date End Date Taking? Authorizing Provider  ACCU-CHEK GUIDE test strip USE   TO CHECK GLUCOSE UP TO 4 TIMES DAILY 09/04/22   Marrian Salvage, FNP  Accu-Chek Softclix Lancets lancets USE   TO CHECK GLUCOSE UP TO 4 TIMES DAILY 09/04/22   Marrian Salvage, FNP  amLODipine (NORVASC) 5 MG tablet Take 1 tablet (5 mg total) by mouth daily. 11/04/22   Marrian Salvage, FNP  blood glucose meter kit and supplies KIT Dispense based on patient and insurance preference. Use up to four times daily as directed. 04/22/22   Marrian Salvage, FNP  enoxaparin (LOVENOX) 40 MG/0.4ML injection Inject 0.4 mLs (40 mg total) into the skin every 12 (twelve) hours. 09/24/22 10/24/22  Johnathan Hausen, MD  gabapentin (NEURONTIN) 300 MG capsule TAKE 1 CAPSULE BY MOUTH THREE TIMES DAILY 07/24/21   Hoyt Koch, MD  IRON, FERROUS SULFATE, PO Take 1 tablet by mouth daily.    [provider]  ondansetron (ZOFRAN-ODT) 4 MG disintegrating tablet  Dissolve 1 tablet (4 mg total) by mouth every 6 (six) hours as needed for nausea or vomiting. 09/24/22   Johnathan Hausen, MD  oxyCODONE (OXY IR/ROXICODONE) 5 MG immediate release tablet Take 1 tablet (5 mg total) by mouth every 6 (six) hours as needed for severe pain. 09/24/22   Johnathan Hausen, MD  Oxycodone HCl 10 MG TABS Take 10 mg by mouth 4 (four) times daily as needed (pain.).    [provider]  pantoprazole (PROTONIX) 40 MG tablet Take 40 mg by mouth 2 (two) times daily.    [provider]  potassium chloride SA (K-DUR,KLOR-CON) 20 MEQ tablet Take 20 mEq by mouth daily. 11/25/11 01/20/12  Owens Shark, NP  prochlorperazine (COMPAZINE) 10 MG tablet Take 10 mg by mouth every 6 (six) hours as needed. For nausea/vomiting.  01/18/12  Ladell Pier, MD      Allergies    Tylenol [acetaminophen]    Review of Systems   Review of Systems  Constitutional:  Negative for chills and fever.  Respiratory:  Negative for shortness of breath.   Cardiovascular:  Positive for leg swelling. Negative for chest pain.  All other systems reviewed and are negative.   Physical Exam Updated Vital Signs BP (!) 145/82   Pulse 74   Temp 97.9 F (36.6 C) (Oral)   Resp 17   Ht 5\' 11"  (1.803 m)   Wt 109.8 kg   SpO2 98%  BMI 33.75 kg/m  Physical Exam Vitals and nursing note reviewed.  Constitutional:      General: She is not in acute distress.    Appearance: She is not ill-appearing.  HENT:     Head: Normocephalic.  Eyes:     Pupils: Pupils are equal, round, and reactive to light.  Cardiovascular:     Rate and Rhythm: Normal rate and regular rhythm.     Pulses: Normal pulses.     Heart sounds: Normal heart sounds. No murmur heard.    No friction rub. No gallop.  Pulmonary:     Effort: Pulmonary effort is normal.     Breath sounds: Normal breath sounds.  Abdominal:     General: Abdomen is flat. There is no distension.     Palpations: Abdomen is soft.     Tenderness: There  is no abdominal tenderness. There is no guarding or rebound.  Musculoskeletal:        General: Normal range of motion.     Cervical back: Neck supple.     Comments: Pitting edema to LLE. Pedal pulses palpable. Soft compartments.  Skin:    General: Skin is warm and dry.  Neurological:     General: No focal deficit present.     Mental Status: She is alert.  Psychiatric:        Mood and Affect: Mood normal.        Behavior: Behavior normal.     ED Results / Procedures / Treatments   Labs (all labs ordered are listed, but only abnormal results are displayed) Labs Reviewed - No data to display  EKG None  Radiology VAS [place] LOWER EXTREMITY VENOUS (DVT)  Result Date: 01/26/2023  Lower Venous DVT Study Patient Name:  Annett Gula  Date of Exam:   01/26/2023 Medical Rec #: [id number]       Accession #:    [id number] Date of Birth: [birth year]       Patient Gender: F Patient Age:   30 years Exam Location:  [facility] Procedure:      VAS [place] LOWER EXTREMITY VENOUS (DVT) Referring Phys: Georgina Snell --------------------------------------------------------------------------------  Indications: Knee and ankle pain/ swelling for 2 years, but worsening pain and swelling for 1 week.  Comparison Study: No prior study. Performing Technologist: McKayla Maag RVT, [place]  Examination Guidelines: A complete evaluation includes B-mode imaging, spectral Doppler, color Doppler, and power Doppler as needed of all accessible portions of each vessel. Bilateral testing is considered an integral part of a complete examination. Limited examinations for reoccurring indications may be performed as noted. The reflux portion of the exam is performed with the patient in reverse Trendelenburg.  +-----+---------------+---------+-----------+----------+--------------+ RIGHTCompressibilityPhasicitySpontaneityPropertiesThrombus Aging +-----+---------------+---------+-----------+----------+--------------+ CFV  Full            Yes      Yes                                 +-----+---------------+---------+-----------+----------+--------------+ SFJ  Full                                                        +-----+---------------+---------+-----------+----------+--------------+   +---------+---------------+---------+-----------+----------+-------------------+ LEFT     CompressibilityPhasicitySpontaneityPropertiesThrombus Aging      +---------+---------------+---------+-----------+----------+-------------------+ CFV      Full  Yes      Yes                                      +---------+---------------+---------+-----------+----------+-------------------+ SFJ      Full                                                             +---------+---------------+---------+-----------+----------+-------------------+ FV Prox  Full                                                             +---------+---------------+---------+-----------+----------+-------------------+ FV Mid   Full                                                             +---------+---------------+---------+-----------+----------+-------------------+ FV DistalFull                                                             +---------+---------------+---------+-----------+----------+-------------------+ PFV      Full                                                             +---------+---------------+---------+-----------+----------+-------------------+ POP      Full           Yes      Yes                                      +---------+---------------+---------+-----------+----------+-------------------+ PTV      Full                                                             +---------+---------------+---------+-----------+----------+-------------------+ PERO     Full                                         Not well visualized  +---------+---------------+---------+-----------+----------+-------------------+    Summary: RIGHT: - No evidence of common femoral vein obstruction.  LEFT: - There is no evidence of deep vein thrombosis in the lower extremity. However, portions of this examination were limited- see technologist comments above.  - No cystic structure  found in the popliteal fossa.  *See table(s) above for measurements and observations.    Preliminary     Procedures Procedures    Medications Ordered in ED Medications - No data to display  ED Course/ Medical Decision Making/ A&P                             Medical Decision Making Amount and/or Complexity of Data Reviewed External Data Reviewed: notes.    Details: [facility] note Radiology: independent interpretation performed. Decision-making details documented in ED Course.   This patient presents to the ED for concern of LLE edema, this involves an extensive number of treatment options, and is a complaint that carries with it a high risk of complications and morbidity.  The differential diagnosis includes DVT, cellulitis, bony fracture, CHF, etc  62 year old female presents to the ED due to left lower extremity edema x 1 week.  Remote left ankle injury 2 years ago.  Chronic pain of left lower extremity on chronic pain meds.  No chest pain or shortness of breath.  Previous history of DVT not currently on any anticoagulants.  Patient evaluated at urgent care prior to arrival and sent to the ED to rule out DVT.  Upon arrival, patient afebrile, not tachycardic or hypoxic.  Patient in no acute distress.  2+ pitting edema to left lower extremity.  Left lower extremity neurovascularly intact with soft compartments.  Low suspicion for compartment syndrome.  No injury over the past week to suggest bony fracture, will hold on x-ray.  Ultrasound ordered at triage which I reviewed which is negative for DVT. There is some mild edema to RLE; however left > right. Patient has PCP  appointment tomorrow. No history of CHF. Denies CP and SOB. Ace wrap placed on LLE. Given no SOB and normal vitals, feel patient can be worked up further in the outpatient setting at PCP tomorrow. Patient agreeable. Edema to LLE could be related to remote injury. Patient also on amlodipine which could be causing some lower extremity edema. No evidence of cellulitis. Patient stable for discharge. Strict ED precautions discussed with patient. Patient states understanding and agrees to plan. Patient discharged home in no acute distress and stable vitals  Has PCP       Final Clinical Impression(s) / ED Diagnoses Final diagnoses:  Leg swelling    Rx / DC Orders ED Discharge Orders     None         Karie Kirks 01/26/23 2039    Valarie Merino, MD 01/26/23 2326

## 2023-01-26 NOTE — ED Triage Notes (Signed)
C/o pain to left ankle radiating up to knee with  swelling x1 week.  Denies injury, fall, trauma.  Left calf tender to palpitation.  No discoloration noted.  Pedal pulse +

## 2023-01-26 NOTE — ED Notes (Signed)
Patient is being discharged from the Urgent Care and sent to the Emergency Department via POV . Per Towana Badger, NP, patient is in need of higher level of care due to concern of DVT. Patient is aware and verbalizes understanding of plan of care.  Vitals:   01/26/23 1307  BP: 130/86  Pulse: 74  Resp: 16  Temp: 97.9 F (36.6 C)  SpO2: 99%

## 2023-01-26 NOTE — ED Provider Notes (Signed)
[facility]    CSN: [id number] Arrival date & time: 01/26/23  1259      History   Chief Complaint Chief Complaint  Patient presents with   Knee Pain   Leg Pain    HPI Mary [name] is a 62 y.o. female.   Patient presents with left lower extremity pain and swelling that started about a week ago.  Patient reports that pain is present from the ankle to the knee with associated swelling.  Denies any recent injuries.  Patient reports that she broke her ankle approximately 2 years ago and is followed by [facility] for pain management and to ensure adequate healing.  She states that she just went back to work after she was approved for 8 hours a day for 4 days a week.  Has not taken any medication for pain other than her prescribed oxycodone.  Reports that she has numbness and tingling at baseline given history of diabetes and takes medication for this.  Reports numbness and tingling is unchanged from baseline.  Denies any recent long distance travel in a car or plane.   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 w/prolonged cold sensitivity--Oxaliplatin related   Pneumothorax 07/19/2011   Bilateral   Pre-diabetes    Renal cysts, acquired, bilateral    Sleep apnea    does not use CPAP on regular basis    Patient Active Problem List   Diagnosis Date Noted   Status post sleeve gastrectomy 09/22/2022   Left trimalleolar fracture, closed, initial encounter 12/18/2020   Left ankle pain  12/18/2020   Labial cyst 03/05/2018   Chest pain 05/30/2017   Chronic nonintractable headache 05/04/2017   Ankle edema, bilateral 05/04/2017   Cellulitis 04/04/2016   Abnormal x-ray 04/04/2016   Hearing loss due to cerumen impaction 01/14/2016   Impaired fasting blood sugar 01/14/2016   Plantar fasciitis, right 11/22/2015   Right calcaneal fracture 08/15/2015   OSA (obstructive sleep apnea) 09/26/2014   Peripheral neuropathy due to chemotherapy ([place]) 06/05/2014   Obesity 06/05/2014   history of DVT (deep venous thrombosis) 02/20/2014   GERD (gastroesophageal reflux disease) 02/20/2014   Hernia 09/22/2012   Iron deficiency anemia, unspecified 08/30/2011   Colon cancer ([place]) 08/29/2011   Cancer of descending colon ([place]) 05/12/2011    Past Surgical History:  Procedure Laterality Date   ABDOMINAL HYSTERECTOMY  mid 20s   bleeding   BREAST BIOPSY Right 08/2021   COLON SURGERY  05/17/2011   Partial colectomy   COLONOSCOPY  09/04/2014   One diminutive polyp at the recto sigmoid colon removed. Diverticulosis of the entire examined colon. Internal hemorrhoids.    HERNIA REPAIR  09/06/2012   lap incisional hernia   INSERTION OF MESH  09/06/2012   Procedure: INSERTION OF MESH;  Surgeon: Gwenyth Ober, MD;  Location: [place];  Service: General;  Laterality: N/A;   ORIF ANKLE FRACTURE Left 12/27/2020   Procedure: OPEN REDUCTION  INTERNAL FIXATION (ORIF) Left ankle trimalleolar fracture dislocation;  Surgeon: Wylene Simmer, MD;  Location: [place];  Service: Orthopedics;  Laterality: Left;  58min   PORT-A-CATH REMOVAL  05/06/2012   Procedure: MINOR REMOVAL PORT-A-CATH;  Surgeon: Gwenyth Ober, MD;  Location: [place];  Service: General;  Laterality: Right;   PORTACATH PLACEMENT  07/20/2011   Right side placed   PORTACATH PLACEMENT     left side & right side   UPPER GI ENDOSCOPY N/A 09/22/2022   Procedure: UPPER GI ENDOSCOPY;  Surgeon: Johnathan Hausen, MD;   Location: [facility];  Service: General;  Laterality: N/A;   VENTRAL HERNIA REPAIR  09/06/2012   Procedure: LAPAROSCOPIC VENTRAL HERNIA;  Surgeon: Gwenyth Ober, MD;  Location: [place];  Service: General;  Laterality: N/A;    OB History     Gravida  5   Para  3   Term      Preterm      AB  2   Living  3      SAB  2   IAB      Ectopic      Multiple      Live Births               Home Medications    Prior to Admission medications   Medication Sig Start Date End Date Taking? Authorizing Provider  ACCU-CHEK GUIDE test strip USE   TO CHECK GLUCOSE UP TO 4 TIMES DAILY 09/04/22   Marrian Salvage, FNP  Accu-Chek Softclix Lancets lancets USE   TO CHECK GLUCOSE UP TO 4 TIMES DAILY 09/04/22   Marrian Salvage, FNP  amLODipine (NORVASC) 5 MG tablet Take 1 tablet (5 mg total) by mouth daily. 11/04/22   Marrian Salvage, FNP  blood glucose meter kit and supplies KIT Dispense based on patient and insurance preference. Use up to four times daily as directed. 04/22/22   Marrian Salvage, FNP  enoxaparin (LOVENOX) 40 MG/0.4ML injection Inject 0.4 mLs (40 mg total) into the skin every 12 (twelve) hours. 09/24/22 10/24/22  Johnathan Hausen, MD  gabapentin (NEURONTIN) 300 MG capsule TAKE 1 CAPSULE BY MOUTH THREE TIMES DAILY 07/24/21   Hoyt Koch, MD  IRON, FERROUS SULFATE, PO Take 1 tablet by mouth daily.    [provider]  ondansetron (ZOFRAN-ODT) 4 MG disintegrating tablet Dissolve 1 tablet (4 mg total) by mouth every 6 (six) hours as needed for nausea or vomiting. 09/24/22   Johnathan Hausen, MD  oxyCODONE (OXY IR/ROXICODONE) 5 MG immediate release tablet Take 1 tablet (5 mg total) by mouth every 6 (six) hours as needed for severe pain. 09/24/22   Johnathan Hausen, MD  Oxycodone HCl 10 MG TABS Take 10 mg by mouth 4 (four) times daily as needed (pain.).    [provider]  pantoprazole (PROTONIX) 40 MG tablet Take 40 mg by mouth 2 (two)  times daily.    [provider]  potassium chloride SA (K-DUR,KLOR-CON) 20 MEQ tablet Take 20 mEq by mouth daily. 11/25/11 01/20/12  Owens Shark, NP  prochlorperazine (COMPAZINE) 10 MG tablet Take 10 mg by mouth every 6 (six) hours as needed. For nausea/vomiting.  01/18/12  Ladell Pier, MD    Family History Family History  Problem Relation Age of Onset   Diabetes Mother    Hypertension Mother    Cancer Father        leukemia   Cancer Maternal Uncle  lung   Cancer Maternal Uncle        colon   Colitis Maternal Uncle    Cancer Maternal Uncle        prostate   Breast cancer Cousin 3       paternal first cousin   Cancer Brother        PROSTATE   Other Brother        MVA   Esophageal cancer Neg Hx    Rectal cancer Neg Hx    Stomach cancer Neg Hx     Social History Social History   Tobacco Use   Smoking status: Never   Smokeless tobacco: Never  Vaping Use   Vaping Use: Never used  Substance Use Topics   Alcohol use: No   Drug use: No     Allergies   Tylenol [acetaminophen]   Review of Systems Review of Systems Per HPI  Physical Exam Triage Vital Signs ED Triage Vitals  Enc Vitals Group     BP 01/26/23 1307 130/86     Pulse Rate 01/26/23 1307 74     Resp 01/26/23 1307 16     Temp 01/26/23 1307 97.9 F (36.6 C)     Temp Source 01/26/23 1307 Oral     SpO2 01/26/23 1307 99 %     Weight --      Height --      Head Circumference --      Peak Flow --      Pain Score 01/26/23 1306 9     Pain Loc --      Pain Edu? --      Excl. in [place]? --    No data found.  Updated Vital Signs BP 130/86 (BP Location: Left Arm)   Pulse 74   Temp 97.9 F (36.6 C) (Oral)   Resp 16   SpO2 99%   Visual Acuity Right Eye Distance:   Left Eye Distance:   Bilateral Distance:    Right Eye Near:   Left Eye Near:    Bilateral Near:     Physical Exam Constitutional:      General: She is not in acute distress.    Appearance: Normal appearance. She  is not toxic-appearing or diaphoretic.  HENT:     Head: Normocephalic and atraumatic.  Eyes:     Extraocular Movements: Extraocular movements intact.     Conjunctiva/sclera: Conjunctivae normal.  Pulmonary:     Effort: Pulmonary effort is normal.  Skin:    Comments: When compared to right lower extremity, patient has some mild nonpitting edema present from left ankle to left lower knee.  Tenderness to palpation of calf as well.  No obvious discoloration noted.  Patient does have scarring present to left medial ankle.  Mild erythematous discoloration located at scar.  Patient has tenderness to palpation throughout ankle which she reports is changed from baseline.  Capillary refill and pulses intact.  Neurological:     General: No focal deficit present.     Mental Status: She is alert and oriented to person, place, and time. Mental status is at baseline.  Psychiatric:        Mood and Affect: Mood normal.        Behavior: Behavior normal.        Thought Content: Thought content normal.        Judgment: Judgment normal.      UC Treatments / Results  Labs (all labs ordered are listed, but only abnormal results  are displayed) Labs Reviewed - No data to display  EKG   Radiology No results found.  Procedures Procedures (including critical care time)  Medications Ordered in UC Medications - No data to display  Initial Impression / Assessment and Plan / UC Course  I have reviewed the triage vital signs and the nursing notes.  Pertinent labs & imaging results that were available during my care of the patient were reviewed by me and considered in my medical decision making (see chart for details).     Patient's symptoms could be inflammation from previous ankle injury but I am concerned for possible DVT given physical exam and patient's symptoms.  It does not appear the patient takes Lovenox any longer so this is worrisome.  Do think that more emergent evaluation with possible  ultrasound of the area is necessary.  Patient was advised to go to the ER today for further evaluation and management.  She was agreeable with this plan.  Vital signs and patient stable at discharge.  Agree with patient self transport to the ER. Final Clinical Impressions(s) / UC Diagnoses   Final diagnoses:  Localized swelling of left lower extremity  Acute pain of left knee  Pain of left calf  Acute left ankle pain     Discharge Instructions      Go straight to the emergency department as soon as you leave urgent care for further evaluation and management.    ED Prescriptions   None    PDMP not reviewed this encounter.   Teodora Medici, [place] 01/26/23 [phone]

## 2023-01-26 NOTE — ED Triage Notes (Signed)
Pt presents with left knee and ankle pain and swelling Xs 1 week. States has hx of injury in left ankle due to fall.

## 2023-01-26 NOTE — ED Triage Notes (Signed)
Pt reports L knee pain/swelling x1 week. Pt has taken pain pills with no relief.

## 2023-01-26 NOTE — Discharge Instructions (Signed)
Go straight to the emergency department as soon as you leave urgent care for further evaluation and management. 

## 2023-01-26 NOTE — Discharge Instructions (Addendum)
It was a pleasure taking care of you today.  As discussed, your ultrasound did not show evidence of a blood clot.  Please report to your PCP appointment for further evaluation.  Return to the ER for new or worsening symptoms.

## 2023-01-26 NOTE — Progress Notes (Signed)
Left lower extremity venous study completed.   Preliminary results relayed to RN.  Please see CV Procedures for preliminary results.  Federick Levene, RVT  7:43 PM 01/26/23

## 2023-01-27 ENCOUNTER — Ambulatory Visit (INDEPENDENT_AMBULATORY_CARE_PROVIDER_SITE_OTHER): Payer: 59 | Admitting: Family

## 2023-01-27 ENCOUNTER — Encounter: Payer: Self-pay | Admitting: Family

## 2023-01-27 VITALS — BP 112/64 | HR 73 | Resp 18 | Ht 71.0 in | Wt 240.0 lb

## 2023-01-27 DIAGNOSIS — R600 Localized edema: Secondary | ICD-10-CM | POA: Diagnosis not present

## 2023-01-27 NOTE — Progress Notes (Signed)
Mary [name] is a 62 y.o. female with the following history as recorded in Epic[employer]:  Patient Active Problem List   Diagnosis Date Noted   Status post sleeve gastrectomy 09/22/2022   Left trimalleolar fracture, closed, initial encounter 12/18/2020   Left ankle pain 12/18/2020   Labial cyst 03/05/2018   Chest pain 05/30/2017   Chronic nonintractable headache 05/04/2017   Ankle edema, bilateral 05/04/2017   Cellulitis 04/04/2016   Abnormal x-ray 04/04/2016   Hearing loss due to cerumen impaction 01/14/2016   Impaired fasting blood sugar 01/14/2016   Plantar fasciitis, right 11/22/2015   Right calcaneal fracture 08/15/2015   OSA (obstructive sleep apnea) 09/26/2014   Peripheral neuropathy due to chemotherapy ([place]) 06/05/2014   Obesity 06/05/2014   history of DVT (deep venous thrombosis) 02/20/2014   GERD (gastroesophageal reflux disease) 02/20/2014   Hernia 09/22/2012   Iron deficiency anemia, unspecified 08/30/2011   Colon cancer ([place]) 08/29/2011   Cancer of descending colon ([place]) 05/12/2011    Current Outpatient Medications  Medication Sig Dispense Refill   ACCU-CHEK GUIDE test strip USE   TO CHECK GLUCOSE UP TO 4 TIMES DAILY 100 each 0   Accu-Chek Softclix Lancets lancets USE   TO CHECK GLUCOSE UP TO 4 TIMES DAILY 100 each 0   amLODipine (NORVASC) 5 MG tablet Take 1 tablet (5 mg total) by mouth daily. 90 tablet 1   blood glucose meter kit and supplies KIT Dispense based on patient and insurance preference. Use up to four times daily as directed. 1 each 0   gabapentin (NEURONTIN) 300 MG capsule TAKE 1 CAPSULE BY MOUTH THREE TIMES DAILY 90 capsule 0   IRON, FERROUS SULFATE, PO Take 1 tablet by mouth daily.     oxyCODONE (OXY IR/ROXICODONE) 5 MG immediate release tablet Take 1 tablet (5 mg total) by mouth every 6 (six) hours as needed for severe pain. 20 tablet 0   Oxycodone HCl 10 MG TABS Take 10 mg by mouth 4 (four) times daily as needed (pain.).     pantoprazole (PROTONIX) 40  MG tablet Take 40 mg by mouth 2 (two) times daily.     enoxaparin (LOVENOX) 40 MG/0.4ML injection Inject 0.4 mLs (40 mg total) into the skin every 12 (twelve) hours. 24 mL 0   ondansetron (ZOFRAN-ODT) 4 MG disintegrating tablet Dissolve 1 tablet (4 mg total) by mouth every 6 (six) hours as needed for nausea or vomiting. 20 tablet 0   Current Facility-Administered Medications  Medication Dose Route Frequency Provider Last Rate Last Admin   cyanocobalamin ((VITAMIN B-12)) injection 1,000 mcg  1,000 mcg Intramuscular Once Marrian Salvage, FNP       cyanocobalamin ((VITAMIN B-12)) injection 1,000 mcg  1,000 mcg Intramuscular Q30 days Marrian Salvage, FNP   1,000 mcg at 05/14/22 [id number]    Allergies: Tylenol [acetaminophen]  Past Medical History:  Diagnosis Date   Adenocarcinoma sigmoid colon 05/12/2011   Stage III(pT2pN2b) descending sigmoid   Arm DVT (deep venous thromboembolism), acute ([place]) 07/04/2011   r/t PAC   Blood transfusion    Cancer of descending colon ([place]) 05/12/2011   Diverticulitis    GERD (gastroesophageal reflux disease)    History of migraine headaches    Hypertension    Incisional hernia    iron deficiency anemia    Multiple hemangiomas    Liver   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 w/prolonged cold sensitivity--Oxaliplatin related   Pneumothorax 07/19/2011   Bilateral   Pre-diabetes  Renal cysts, acquired, bilateral    Sleep apnea    does not use CPAP on regular basis    Past Surgical History:  Procedure Laterality Date   ABDOMINAL HYSTERECTOMY  mid 20s   bleeding   BREAST BIOPSY Right 08/2021   COLON SURGERY  05/17/2011   Partial colectomy   COLONOSCOPY  09/04/2014   One diminutive polyp at the recto sigmoid colon removed. Diverticulosis of the entire examined colon. Internal hemorrhoids.    HERNIA REPAIR  09/06/2012   lap incisional hernia   INSERTION OF MESH  09/06/2012   Procedure: INSERTION OF MESH;  Surgeon: Gwenyth Ober, MD;  Location: [place];  Service: General;  Laterality: N/A;   ORIF ANKLE FRACTURE Left 12/27/2020   Procedure: OPEN REDUCTION INTERNAL FIXATION (ORIF) Left ankle trimalleolar fracture dislocation;  Surgeon: Wylene Simmer, MD;  Location: [place];  Service: Orthopedics;  Laterality: Left;  85min   PORT-A-CATH REMOVAL  05/06/2012   Procedure: MINOR REMOVAL PORT-A-CATH;  Surgeon: Gwenyth Ober, MD;  Location: [place];  Service: General;  Laterality: Right;   PORTACATH PLACEMENT  07/20/2011   Right side placed   PORTACATH PLACEMENT     left side & right side   UPPER GI ENDOSCOPY N/A 09/22/2022   Procedure: UPPER GI ENDOSCOPY;  Surgeon: Johnathan Hausen, MD;  [facility];  Service: General;  Laterality: N/A;   VENTRAL HERNIA REPAIR  09/06/2012   Procedure: LAPAROSCOPIC VENTRAL HERNIA;  Surgeon: Gwenyth Ober, MD;  Location: [place];  Service: General;  Laterality: N/A;    Family History  Problem Relation Age of Onset   Diabetes Mother    Hypertension Mother    Cancer Father        leukemia   Cancer Maternal Uncle        lung   Cancer Maternal Uncle        colon   Colitis Maternal Uncle    Cancer Maternal Uncle        prostate   Breast cancer Cousin 100       paternal first cousin   Cancer Brother        PROSTATE   Other Brother        MVA   Esophageal cancer Neg Hx    Rectal cancer Neg Hx    Stomach cancer Neg Hx     Social History   Tobacco Use   Smoking status: Never   Smokeless tobacco: Never  Substance Use Topics   Alcohol use: No    Subjective:  Seen at the ER yesterday with left lower extremity pain and swelling; also saw her chronic pain specialist who manages the chronic pain in her left ankle; DVT ruled out; patient very concerned about using any type of diuretic;  Has been having to stand on her feet more at work recently- 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  Head: Normocephalic and atraumatic  Eyes: Sclera and conjunctiva clear; pupils round and reactive to light; extraocular movements intact  Ears: External normal; canals clear; tympanic membranes normal  Oropharynx: Pink, supple. No suspicious lesions  Neck: Supple without thyromegaly, adenopathy  Lungs: Respirations unlabored; Musculoskeletal: No deformities; no active joint inflammation  Extremities: pitting edema noted left lower extremity, cyanosis, clubbing  Vessels: Symmetric bilaterally  Neurologic: Alert and oriented; speech intact; face symmetrical; moves all extremities well; CNII-XII intact without focal deficit   Assessment:  1. Pedal edema     Plan:  Agree Lasix would be beneficial; patient prefers to get her renal functions checked before starting diuretic which is reasonable; follow up to be determined.   No follow-ups on file.  Orders Placed This Encounter  Procedures   Comp Met (CMET)   B Nat Peptide    Requested Prescriptions    No prescriptions requested or ordered in this encounter

## 2023-01-27 NOTE — Patient Instructions (Signed)
We will be in touch once we get your lab results back to discuss a short term trial of Lasix.

## 2023-01-28 LAB — COMPREHENSIVE METABOLIC PANEL
ALT: 15 U/L (ref 0–35)
AST: 15 U/L (ref 0–37)
Albumin: 3.3 g/dL — ABNORMAL LOW (ref 3.5–5.2)
Alkaline Phosphatase: 74 U/L (ref 39–117)
BUN: 17 mg/dL (ref 6–23)
CO2: 28 mEq/L (ref 19–32)
Calcium: 9 mg/dL (ref 8.4–10.5)
Chloride: 106 mEq/L (ref 96–112)
Creatinine, Ser: 1.66 mg/dL — ABNORMAL HIGH (ref 0.40–1.20)
GFR: 33.08 mL/min — ABNORMAL LOW (ref >60.00)
Glucose, Bld: 105 mg/dL — ABNORMAL HIGH (ref 70–99)
Potassium: 4.3 mEq/L (ref 3.5–5.1)
Sodium: 140 mEq/L (ref 135–145)
Total Bilirubin: 0.2 mg/dL (ref 0.2–1.2)
Total Protein: 5.4 g/dL — ABNORMAL LOW (ref 6.0–8.3)

## 2023-01-28 LAB — BRAIN NATRIURETIC PEPTIDE: Pro B Natriuretic peptide (BNP): 15 pg/mL (ref 0.0–100.0)

## 2023-02-18 ENCOUNTER — Encounter: Payer: Self-pay | Admitting: Skilled Nursing Facility1

## 2023-02-18 ENCOUNTER — Encounter: Payer: 59 | Attending: Surgery | Admitting: Skilled Nursing Facility1

## 2023-02-18 DIAGNOSIS — E669 Obesity, unspecified: Secondary | ICD-10-CM | POA: Diagnosis present

## 2023-02-18 DIAGNOSIS — E119 Type 2 diabetes mellitus without complications: Secondary | ICD-10-CM | POA: Diagnosis present

## 2023-02-18 NOTE — Progress Notes (Signed)
Bariatric Nutrition Follow-Up Visit Medical Nutrition Therapy   NUTRITION ASSESSMENT   Anthropometrics  Surgery date: 09/22/22 Surgery type: Sleeve Bowel Habits: Every day to every other day no complaints  Anthropometrics  Start weight at [facility]: 260.7 lbs (date: 01/21/2022) Height: 71 inches Today's weight: pt declined  Clinical  Medical hx: sleep apnea, GERD, cancer of descending colon (some removed), anemia, anxiety, CKD, DM Medications: taking B12 monthly,  Labs: A1C 6.5, creatinine 1.66, GFR 33.08, albumin 3.3, total protein 5.4 Notable signs/symptoms: migraines, constipation Any previous deficiencies? Yes: iron and b12   Body Composition Scale 11/18/2021  Current Body Weight 244.6  Total Body Fat % 42.2  Visceral Fat 13  Fat-Free Mass % 57.7   Total Body Water % 43.3  Muscle-Mass lbs 34.8  BMI 33.9  Body Fat Displacement          Torso  lbs 64         Left Leg  lbs 12.8         Right Leg  lbs 12.8         Left Arm  lbs 6.4         Right Arm   lbs 6.4      Lifestyle & Dietary Hx  Pt arrives limping with ankle and knee pain needing an MRI suffering for the last 3 weeks. Pt states she is in severe pain with her knee stating the pain is decreasing her appetite. Pt states since being in pain she has not had good energy.  Pt states she works for [employer].    Estimated daily fluid intake: 70 oz Estimated daily protein intake: 60+ g Supplements: multi and calcium  Current average weekly physical activity: ADL's due to pain  24-Hr Dietary Recall First Meal: protein shake Snack:   Second Meal 12:45: shrimp and green beans or tomatoes and salad: ranch + tomato, lettuce, cucumber, apple and cashews and chicken Snack: protein bar Third Meal: chicken and green beans + fruit cocktail Snack:  Beverages: water + lemon, water, lemonade, fruit smoothie  Post-Op Goals/ Signs/ Symptoms Using straws: no Drinking while eating: no Chewing/swallowing difficulties:  no Changes in vision: no Changes to mood/headaches: no Hair loss/changes to skin/nails: no Difficulty focusing/concentrating: no Sweating: no Limb weakness: no Dizziness/lightheadedness: no Palpitations: no  Carbonated/caffeinated beverages: no N/V/D/C/Gas: bowel movement every other day Abdominal pain: no Dumping syndrome: no    NUTRITION DIAGNOSIS  Overweight/obesity (-3.3) related to past poor dietary habits and physical inactivity as evidenced by completed bariatric surgery and following dietary guidelines for continued weight loss and healthy nutrition status.     NUTRITION INTERVENTION Nutrition counseling (C-1) and education (E-2) to facilitate bariatric surgery goals, including: The importance of consuming adequate calories as well as certain nutrients daily due to the body's need for essential vitamins, minerals, and fats The importance of daily physical activity and to reach a goal of at least 150 minutes of moderate to vigorous physical activity weekly (or as directed by their physician) due to benefits such as increased musculature and improved lab values The importance of intuitive eating specifically learning hunger-satiety cues and understanding the importance of learning a new body: The importance of mindful eating to avoid grazing behaviors  Creation of balanced meals within the context of CKD and DM Importance of vegetables To have an overall healthy diet, adult men and women are recommended to consume anywhere from 2-3 cups of vegetables daily. Vegetables provide a wide range of vitamins and minerals such as vitamin A,  vitamin C, potassium, and folic acid. According to the [employer], including fruit and vegetables daily may reduce the risk of cardiovascular disease, certain cancers, and other non-communicable diseases. Creation of balanced and diverse meals to increase the intake of nutrient-rich foods that provide essential vitamins, minerals, fiber,  and phytonutrients  Variety of Fruits and Vegetables:  Aim for a colorful array of fruits and vegetables to ensure a wide range of nutrients. Include a mix of leafy greens, berries, citrus fruits, cruciferous vegetables, and more. Whole Grains: Choose whole grains over refined grains. Examples include brown rice, quinoa, oats, whole wheat, and barley. Lean Proteins: Include lean sources of protein, such as poultry, fish, tofu, legumes, beans, lentils, and low-fat dairy products. Limit red and processed meats. Healthy Fats: Incorporate sources of healthy fats, including avocados, nuts, seeds, and olive oil. Limit saturated and trans fats found in fried and processed foods. Dairy or Dairy Alternatives: Choose low-fat or fat-free dairy products, or plant-based alternatives like almond or soy milk. Portion Control: Be mindful of portion sizes to avoid overeating. Pay attention to hunger and satisfaction cues. Limit Added Sugars: Minimize the consumption of sugary beverages, snacks, and desserts. Check food labels for added sugars and opt for natural sources of sweetness such as whole fruits. Hydration: Drink plenty of water throughout the day. Limit sugary drinks and excessive caffeine intake. Moderate Sodium Intake: Reduce the consumption of high-sodium foods. Use herbs and spices for flavor instead of excessive salt. Meal Planning and Preparation: Plan and prepare meals ahead of time to make healthier choices more convenient. Include a mix of food groups in each meal. Limit Processed Foods: Minimize the intake of highly processed and packaged foods that are often high in added sugars, salt, and unhealthy fats. Regular Physical Activity: Combine a healthy diet with regular physical activity for overall well-being. Aim for at least 150 minutes of moderate-intensity aerobic exercise per week, along with strength training. Moderation and Balance: Enjoy treats and indulgent foods in  moderation, emphasizing balance rather than strict restriction. Handouts Previously Provided Include  Melas ideas sheet: processed vs non processed  Learning Style & Readiness for Change Teaching method utilized: Visual & Auditory  Demonstrated degree of understanding via: Teach Back  Readiness Level: action Barriers to learning/adherence to lifestyle change: none identified   RD's Notes for Next Visit Assess adherence to pt chosen goals    MONITORING & EVALUATION Dietary intake, weekly physical activity, body weight  Next Steps Patient is to follow-up in 3 months

## 2023-03-20 LAB — LAB REPORT - SCANNED: EGFR: 40

## 2023-03-25 ENCOUNTER — Encounter: Payer: Self-pay | Admitting: Nephrology

## 2023-04-23 ENCOUNTER — Other Ambulatory Visit: Payer: Self-pay | Admitting: Family

## 2023-04-28 ENCOUNTER — Ambulatory Visit
Admission: EM | Admit: 2023-04-28 | Discharge: 2023-04-28 | Disposition: A | Discharge status: Home / Self Care | Payer: 59 | Attending: Physician Assistant | Admitting: Physician Assistant

## 2023-04-28 DIAGNOSIS — H1031 Unspecified acute conjunctivitis, right eye: Secondary | ICD-10-CM

## 2023-04-28 MED ORDER — TOBRAMYCIN 0.3 % OP SOLN: 1.0000 [drp] | OPHTHALMIC | 0 refills | Status: DC

## 2023-04-28 MED ORDER — TOBRAMYCIN 0.3 % OP SOLN: 1.0000 [drp] | OPHTHALMIC | 0 refills | Status: AC

## 2023-04-28 NOTE — ED Triage Notes (Signed)
Pt presents to office for right eye pain and redness. Pt stated she left her contacts in to long.

## 2023-04-28 NOTE — Discharge Instructions (Addendum)
Return if any problems.

## 2023-04-28 NOTE — ED Provider Notes (Signed)
[facility]    CSN: [id number] Arrival date & time: 04/28/23  0807      History   Chief Complaint Chief Complaint  Patient presents with   Eye Pain    HPI Mary [name] is a 62 y.o. female.   Patient reports she thinks she left her contact in her right eye too long and that it has become infected patient complains of redness to her right eye.  Patient denies any visual changes.  The history is provided by the patient.  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 w/prolonged cold sensitivity--Oxaliplatin related   Pneumothorax 07/19/2011   Bilateral   Pre-diabetes    Renal cysts, acquired, bilateral    Sleep apnea    does not use CPAP on regular basis    Patient Active Problem List   Diagnosis Date Noted   Status post sleeve gastrectomy 09/22/2022   Left trimalleolar fracture, closed, initial encounter 12/18/2020   Left ankle pain 12/18/2020   Labial cyst 03/05/2018   Chest pain 05/30/2017   Chronic nonintractable headache 05/04/2017   Ankle edema, bilateral 05/04/2017   Cellulitis 04/04/2016   Abnormal x-ray 04/04/2016   Hearing loss due to cerumen impaction 01/14/2016   Impaired fasting blood sugar 01/14/2016   Plantar fasciitis, right 11/22/2015   Right calcaneal fracture 08/15/2015   OSA (obstructive sleep apnea) 09/26/2014   Peripheral neuropathy due to chemotherapy (HCC) 06/05/2014   Obesity 06/05/2014   history of DVT (deep venous thrombosis) 02/20/2014   GERD  (gastroesophageal reflux disease) 02/20/2014   Hernia 09/22/2012   Iron deficiency anemia, unspecified 08/30/2011   Colon cancer (HCC) 08/29/2011   Cancer of descending colon (HCC) 05/12/2011    Past Surgical History:  Procedure Laterality Date   ABDOMINAL HYSTERECTOMY  mid 20s   bleeding   BREAST BIOPSY Right 08/2021   COLON SURGERY  05/17/2011   Partial colectomy   COLONOSCOPY  09/04/2014   One diminutive polyp at the recto sigmoid colon removed. Diverticulosis of the entire examined colon. Internal hemorrhoids.    HERNIA REPAIR  09/06/2012   lap incisional hernia   INSERTION OF MESH  09/06/2012   Procedure: INSERTION OF MESH;  Surgeon: Cherylynn Ridges, MD;  Location: [facility];  Service: General;  Laterality: N/A;   ORIF ANKLE FRACTURE Left 12/27/2020   Procedure: OPEN REDUCTION INTERNAL FIXATION (ORIF) Left ankle trimalleolar fracture dislocation;  Surgeon: Toni Arthurs, MD;  Location: [facility];  Service: Orthopedics;  Laterality: Left;    PORT-A-CATH REMOVAL  05/06/2012   Procedure: MINOR REMOVAL PORT-A-CATH;  Surgeon: Cherylynn Ridges, MD;  Location: [facility];  Service: General;  Laterality: Right;   PORTACATH PLACEMENT  07/20/2011   Right side placed   PORTACATH PLACEMENT     left side & right side   UPPER GI ENDOSCOPY N/A 09/22/2022   Procedure: UPPER GI ENDOSCOPY;  Surgeon: Luretha Murphy, MD;  Location: [facility];  Service: General;  Laterality: N/A;   VENTRAL HERNIA REPAIR  09/06/2012   Procedure: LAPAROSCOPIC VENTRAL HERNIA;  Surgeon: Cherylynn Ridges, MD;  Location: [facility];  Service: General;  Laterality: N/A;    OB History     Gravida  5   Para  3   Term      Preterm      AB  2   Living  3      SAB  2   IAB      Ectopic      Multiple      Live Births               Home Medications    Prior to Admission medications   Medication Sig Start Date End Date Taking? Authorizing Provider  Accu-Chek Softclix Lancets  lancets USE   TO CHECK GLUCOSE UP TO 4 TIMES DAILY 09/04/22  Yes Olive Bass, FNP  amLODipine (NORVASC) 5 MG tablet Take 1 tablet (5 mg total) by mouth daily. 04/23/23  Yes Olive Bass, FNP  blood glucose meter kit and supplies KIT Dispense based on patient and insurance preference. Use up to four times daily as directed. 04/22/22  Yes Olive Bass, FNP  gabapentin (NEURONTIN) 300 MG capsule TAKE 1 CAPSULE BY MOUTH THREE TIMES DAILY 07/24/21  Yes Myrlene Broker, MD  IRON, FERROUS SULFATE, PO Take 1 tablet by mouth daily.   Yes [provider]  oxyCODONE (OXY IR/ROXICODONE) 5 MG immediate release tablet Take 1 tablet (5 mg total) by mouth every 6 (six) hours as needed for severe pain. 09/24/22  Yes Luretha Murphy, MD  Oxycodone HCl 10 MG TABS Take 10 mg by mouth 4 (four) times daily as needed (pain.).   Yes [provider]  pantoprazole (PROTONIX) 40 MG tablet Take 40 mg by mouth 2 (two) times daily.   Yes [provider]  ACCU-CHEK GUIDE test strip USE   TO CHECK GLUCOSE UP TO 4 TIMES DAILY 09/04/22   Olive Bass, FNP  enoxaparin (LOVENOX) 40 MG/0.4ML injection Inject 0.4 mLs (40 mg total) into the skin every 12 (twelve) hours. 09/24/22 10/24/22  Luretha Murphy, MD  ondansetron (ZOFRAN-ODT) 4 MG disintegrating tablet Dissolve 1 tablet (4 mg total) by mouth every 6 (six) hours as needed for nausea or vomiting. 09/24/22   Luretha Murphy, MD  tobramycin (TOBREX) 0.3 % ophthalmic solution Place 1 drop into the right eye every 4 (four) hours for 10 days. 04/28/23 05/08/23  Elson Areas, PA-C  potassium chloride SA (K-DUR,KLOR-CON) 20 MEQ tablet Take 20 mEq by mouth daily. 11/25/11 01/20/12  Rana Snare, NP  prochlorperazine (COMPAZINE) 10 MG tablet Take 10 mg by mouth every 6 (six) hours as needed. For nausea/vomiting.  01/18/12  Ladene Artist, MD    Family History Family History  Problem Relation Age of Onset   Diabetes  Mother    Hypertension Mother    Cancer Father        leukemia   Cancer Maternal Uncle        lung   Cancer Maternal Uncle        colon   Colitis Maternal Uncle    Cancer Maternal Uncle        prostate   Breast cancer Cousin 15       paternal first cousin   Cancer Brother        PROSTATE   Other Brother  MVA   Esophageal cancer Neg Hx    Rectal cancer Neg Hx    Stomach cancer Neg Hx     Social History Social History   Tobacco Use   Smoking status: Never   Smokeless tobacco: Never  Vaping Use   Vaping Use: Never used  Substance Use Topics   Alcohol use: No   Drug use: No     Allergies   Tylenol [acetaminophen]   Review of Systems Review of Systems  Eyes:  Positive for pain.  All other systems reviewed and are negative.    Physical Exam Triage Vital Signs ED Triage Vitals [04/28/23 0825]  Enc Vitals Group     BP 119/85     Pulse Rate (!) 108     Resp 18     Temp 98.6 F (37 C)     Temp Source Oral     SpO2 98 %     Weight      Height      Head Circumference      Peak Flow      Pain Score      Pain Loc      Pain Edu?      Excl. in GC?    No data found.  Updated Vital Signs BP 119/85 (BP Location: Left Arm)   Pulse (!) 108   Temp 98.6 F (37 C) (Oral)   Resp 18   SpO2 98%   Visual Acuity Right Eye Distance:   Left Eye Distance:   Bilateral Distance:    Right Eye Near:   Left Eye Near:    Bilateral Near:     Physical Exam Vitals and nursing note reviewed.  Constitutional:      Appearance: She is well-developed.  HENT:     Head: Normocephalic.  Eyes:     Extraocular Movements: Extraocular movements intact.     Pupils: Pupils are equal, round, and reactive to light.     Comments: Injected right conjunctiva, no photophobia  Cardiovascular:     Rate and Rhythm: Normal rate.  Pulmonary:     Effort: Pulmonary effort is normal.  Abdominal:     General: There is no distension.  Musculoskeletal:        General: Normal  range of motion.     Cervical back: Normal range of motion.  Skin:    General: Skin is warm.  Neurological:     General: No focal deficit present.     Mental Status: She is alert and oriented to person, place, and time.      UC Treatments / Results  Labs (all labs ordered are listed, but only abnormal results are displayed) Labs Reviewed - No data to display  EKG   Radiology No results found.  Procedures Procedures (including critical care time)  Medications Ordered in UC Medications - No data to display  Initial Impression / Assessment and Plan / UC Course  I have reviewed the triage vital signs and the nursing notes.  Pertinent labs & imaging results that were available during my care of the patient were reviewed by me and considered in my medical decision making (see chart for details).     Patient advised to leave contacts out for the next 3 days she is given a prescription for Tobrex ophthalmic solution. Final Clinical Impressions(s) / UC Diagnoses   Final diagnoses:  Acute conjunctivitis of right eye, unspecified acute conjunctivitis type     Discharge Instructions  Return if any problems.    ED Prescriptions     Medication Sig Dispense Auth. Provider   tobramycin (TOBREX) 0.3 % ophthalmic solution  (Status: Discontinued) Place 1 drop into the right eye every 4 (four) hours for 10 days. 5 mL Jhonnie Aliano K, PA-C   tobramycin (TOBREX) 0.3 % ophthalmic solution Place 1 drop into the right eye every 4 (four) hours for 10 days. 5 mL Elson Areas, [place]      PDMP not reviewed this encounter. An After Visit Summary was printed and given to the patient.    Elson Areas, [place] 04/28/23 1201

## 2023-07-14 ENCOUNTER — Ambulatory Visit
Admission: EM | Admit: 2023-07-14 | Discharge: 2023-07-14 | Disposition: A | Discharge status: Home / Self Care | Payer: 59 | Attending: Nurse Practitioner | Admitting: Nurse Practitioner

## 2023-07-14 DIAGNOSIS — H1033 Unspecified acute conjunctivitis, bilateral: Secondary | ICD-10-CM | POA: Diagnosis not present

## 2023-07-14 MED ORDER — TOBRAMYCIN 0.3 % OP SOLN: 2.0000 [drp] | OPHTHALMIC | 0 refills | Status: AC

## 2023-07-14 NOTE — ED Provider Notes (Signed)
[facility]    CSN: [id number] Arrival date & time: 07/14/23  1228      History   Chief Complaint Chief Complaint  Patient presents with   Eye Pain    HPI Mary [name] is a 62 y.o. female.   Patient presents today with left eye redness and irritation.  Also reports photophobia from the left eye.  No vision changes, headache, cough, congestion.  Reports she thinks after contacts in for too long and it made her eyes infected.  Reports she got eyedrops for the same thing previously and symptoms improved.  Patient denies foreign body sensation of the left eye.  Patient is extremely tearful during visit, reports her grandchild is in the PICU and is not doing very well.    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 w/prolonged cold sensitivity--Oxaliplatin related   Pneumothorax 07/19/2011   Bilateral   Pre-diabetes    Renal cysts, acquired, bilateral    Sleep apnea    does not use CPAP on regular basis    Patient Active Problem List   Diagnosis Date Noted   Status post sleeve gastrectomy 09/22/2022   Left trimalleolar fracture, closed, initial encounter 12/18/2020   Left ankle pain 12/18/2020   Labial cyst 03/05/2018   Chest pain 05/30/2017   Chronic nonintractable headache 05/04/2017   Ankle edema, bilateral 05/04/2017   Cellulitis 04/04/2016   Abnormal x-ray 04/04/2016   Hearing loss due to cerumen impaction 01/14/2016   Impaired fasting blood sugar 01/14/2016   Plantar fasciitis, right  11/22/2015   Right calcaneal fracture 08/15/2015   OSA (obstructive sleep apnea) 09/26/2014   Peripheral neuropathy due to chemotherapy (HCC) 06/05/2014   Obesity 06/05/2014   history of DVT (deep venous thrombosis) 02/20/2014   GERD (gastroesophageal reflux disease) 02/20/2014   Hernia 09/22/2012   Iron deficiency anemia, unspecified 08/30/2011   Colon cancer (HCC) 08/29/2011   Cancer of descending colon (HCC) 05/12/2011    Past Surgical History:  Procedure Laterality Date   ABDOMINAL HYSTERECTOMY  mid 20s   bleeding   BREAST BIOPSY Right 08/2021   COLON SURGERY  05/17/2011   Partial colectomy   COLONOSCOPY  09/04/2014   One diminutive polyp at the recto sigmoid colon removed. Diverticulosis of the entire examined colon. Internal hemorrhoids.    HERNIA REPAIR  09/06/2012   lap incisional hernia   INSERTION OF MESH  09/06/2012   Procedure: INSERTION OF MESH;  Surgeon: Cherylynn Ridges, MD;  [facility];  Service: General;  Laterality: N/A;   ORIF ANKLE FRACTURE Left 12/27/2020   Procedure: OPEN REDUCTION INTERNAL FIXATION (ORIF) Left ankle trimalleolar fracture dislocation;  Surgeon: Toni Arthurs, MD;  Location: [facility];  Service: Orthopedics;  Laterality: Left;    PORT-A-CATH REMOVAL  05/06/2012   Procedure: MINOR REMOVAL PORT-A-CATH;  Surgeon: Cherylynn Ridges, MD;  Location: [facility];  Service: General;  Laterality: Right;   PORTACATH PLACEMENT  07/20/2011  Right side placed   PORTACATH PLACEMENT     left side & right side   UPPER GI ENDOSCOPY N/A 09/22/2022   Procedure: UPPER GI ENDOSCOPY;  Surgeon: Luretha Murphy, MD;  Location: [facility];  Service: General;  Laterality: N/A;   VENTRAL HERNIA REPAIR  09/06/2012   Procedure: LAPAROSCOPIC VENTRAL HERNIA;  Surgeon: Cherylynn Ridges, MD;  [facility]: [facility];  Service: General;  Laterality: N/A;    OB History     Gravida  5   Para  3   Term      Preterm      AB  2   Living  3       SAB  2   IAB      Ectopic      Multiple      Live Births               Home Medications    Prior to Admission medications   Medication Sig Start Date End Date Taking? Authorizing Provider  tobramycin (TOBREX) 0.3 % ophthalmic solution Place 2 drops into the left eye every 4 (four) hours for 5 days. 07/14/23 07/19/23 Yes Valentino Nose, NP  ACCU-CHEK GUIDE test strip USE   TO CHECK GLUCOSE UP TO 4 TIMES DAILY 09/04/22   Olive Bass, FNP  Accu-Chek Softclix Lancets lancets USE   TO CHECK GLUCOSE UP TO 4 TIMES DAILY 09/04/22   Olive Bass, FNP  amLODipine (NORVASC) 5 MG tablet Take 1 tablet (5 mg total) by mouth daily. 04/23/23   Olive Bass, FNP  blood glucose meter kit and supplies KIT Dispense based on patient and insurance preference. Use up to four times daily as directed. 04/22/22   Olive Bass, FNP  enoxaparin (LOVENOX) 40 MG/0.4ML injection Inject 0.4 mLs (40 mg total) into the skin every 12 (twelve) hours. 09/24/22 10/24/22  Luretha Murphy, MD  gabapentin (NEURONTIN) 300 MG capsule TAKE 1 CAPSULE BY MOUTH THREE TIMES DAILY 07/24/21   Myrlene Broker, MD  IRON, FERROUS SULFATE, PO Take 1 tablet by mouth daily.    [provider]  ondansetron (ZOFRAN-ODT) 4 MG disintegrating tablet Dissolve 1 tablet (4 mg total) by mouth every 6 (six) hours as needed for nausea or vomiting. 09/24/22   Luretha Murphy, MD  oxyCODONE (OXY IR/ROXICODONE) 5 MG immediate release tablet Take 1 tablet (5 mg total) by mouth every 6 (six) hours as needed for severe pain. 09/24/22   Luretha Murphy, MD  Oxycodone HCl 10 MG TABS Take 10 mg by mouth 4 (four) times daily as needed (pain.).    [provider]  pantoprazole (PROTONIX) 40 MG tablet Take 40 mg by mouth 2 (two) times daily.    [provider]  potassium chloride SA (K-DUR,KLOR-CON) 20 MEQ tablet Take 20 mEq by mouth daily. 11/25/11 01/20/12  Rana Snare, NP   prochlorperazine (COMPAZINE) 10 MG tablet Take 10 mg by mouth every 6 (six) hours as needed. For nausea/vomiting.  01/18/12  Ladene Artist, MD    Family History Family History  Problem Relation Age of Onset   Diabetes Mother    Hypertension Mother    Cancer Father        leukemia   Cancer Maternal Uncle        lung   Cancer Maternal Uncle        colon   Colitis Maternal Uncle    Cancer Maternal Uncle  prostate   Breast cancer Cousin 82       paternal first cousin   Cancer Brother        PROSTATE   Other Brother        MVA   Esophageal cancer Neg Hx    Rectal cancer Neg Hx    Stomach cancer Neg Hx     Social History Social History   Tobacco Use   Smoking status: Never   Smokeless tobacco: Never  Vaping Use   Vaping status: Never Used  Substance Use Topics   Alcohol use: No   Drug use: No     Allergies   Tylenol [acetaminophen]   Review of Systems Review of Systems Per HPI  Physical Exam Triage Vital Signs ED Triage Vitals  Encounter Vitals Group     BP 07/14/23 1300 (!) 141/74     Systolic BP Percentile --      Diastolic BP Percentile --      Pulse Rate 07/14/23 1300 83     Resp 07/14/23 1300 18     Temp 07/14/23 1300 98.5 F (36.9 C)     Temp Source 07/14/23 1300 Oral     SpO2 07/14/23 1300 98 %     Weight --      Height --      Head Circumference --      Peak Flow --      Pain Score 07/14/23 1304 0     Pain Loc --      Pain Education --      Exclude from Growth Chart --    No data found.  Updated Vital Signs BP (!) 141/74 (BP Location: Left Arm)   Pulse 83   Temp 98.5 F (36.9 C) (Oral)   Resp 18   SpO2 98%   Visual Acuity Right Eye Distance:   Left Eye Distance:   Bilateral Distance:    Right Eye Near:   Left Eye Near:    Bilateral Near:     Physical Exam Vitals and nursing note reviewed.  Constitutional:      General: She is not in acute distress.    Appearance: Normal appearance. She is not toxic-appearing.   HENT:     Head: Normocephalic and atraumatic.     Right Ear: External ear normal.     Left Ear: External ear normal.  Eyes:     General: No scleral icterus.       Right eye: No discharge.        Left eye: No discharge.     Extraocular Movements: Extraocular movements intact.     Right eye: Normal extraocular motion.     Left eye: Normal extraocular motion.     Conjunctiva/sclera:     Right eye: Right conjunctiva is injected.     Left eye: Left conjunctiva is injected.     Pupils: Pupils are equal, round, and reactive to light.     Comments: Difficult to assess eyes as patient is actively crying.  Pupils are equally reactive to light.  No obvious foreign body.  Fluorescein stain deferred.  Neurological:     Mental Status: She is alert.      UC Treatments / Results  Labs (all labs ordered are listed, but only abnormal results are displayed) Labs Reviewed - No data to display  EKG   Radiology No results found.  Procedures Procedures (including critical care time)  Medications Ordered in UC Medications - No data to display  Initial Impression / Assessment and Plan / UC Course  I have reviewed the triage vital signs and the nursing notes.  Pertinent labs & imaging results that were available during my care of the patient were reviewed by me and considered in my medical decision making (see chart for details).   Patient is well-appearing, normotensive, afebrile, not tachycardic, not tachypneic, oxygenating well on room air.    1. Acute conjunctivitis of both eyes, unspecified acute conjunctivitis type Will treat with tobramycin drops to the left eye every 4 hours for 5 days however for possible corneal abrasion secondary to contact lens use Unclear if right eye is affected as it is also red however patient is actively crying due to social situation Recommended follow-up with ophthalmology if symptoms do not improve with treatment and patient verbalized  understanding  The patient was given the opportunity to ask questions.  All questions answered to their satisfaction.  The patient is in agreement to this plan.    Final Clinical Impressions(s) / UC Diagnoses   Final diagnoses:  Acute conjunctivitis of both eyes, unspecified acute conjunctivitis type     Discharge Instructions      Use the eyedrops in your left eye to help with irritation and redness.  Follow-up with ophthalmologist if symptoms improve with this medicine.   ED Prescriptions     Medication Sig Dispense Auth. Provider   tobramycin (TOBREX) 0.3 % ophthalmic solution Place 2 drops into the left eye every 4 (four) hours for 5 days. 5 mL Valentino Nose, NP      PDMP not reviewed this encounter.   Valentino Nose, NP 07/14/23 [phone]

## 2023-07-14 NOTE — Discharge Instructions (Signed)
Use the eyedrops in your left eye to help with irritation and redness.  Follow-up with ophthalmologist if symptoms improve with this medicine.

## 2023-07-14 NOTE — ED Triage Notes (Signed)
Pt reports redness in left eye as she forgot to take the contact lenses off.

## 2023-08-24 LAB — BASIC METABOLIC PANEL
BUN: 17 (ref 4–21)
CO2: 24 — AB (ref 13–22)
Chloride: 107 (ref 99–108)
Creatinine: 1.4 — AB (ref 0.5–1.1)
Glucose: 91
Potassium: 4.3 meq/L (ref 3.5–5.1)
Sodium: 141 (ref 137–147)

## 2023-08-24 LAB — CBC AND DIFFERENTIAL: Hemoglobin: 11.8 — AB (ref 12.0–16.0)

## 2023-08-24 LAB — COMPREHENSIVE METABOLIC PANEL
Albumin: 4.1 (ref 3.5–5.0)
Calcium: 9.8 (ref 8.7–10.7)
eGFR: 44

## 2023-08-25 ENCOUNTER — Other Ambulatory Visit: Payer: Self-pay | Admitting: Family

## 2023-09-03 ENCOUNTER — Encounter: Payer: Self-pay | Admitting: Family

## 2023-09-24 ENCOUNTER — Other Ambulatory Visit: Payer: Self-pay | Admitting: Family

## 2023-09-24 DIAGNOSIS — Z1231 Encounter for screening mammogram for malignant neoplasm of breast: Secondary | ICD-10-CM

## 2023-09-25 ENCOUNTER — Other Ambulatory Visit (HOSPITAL_BASED_OUTPATIENT_CLINIC_OR_DEPARTMENT_OTHER): Payer: Self-pay

## 2023-09-25 MED ORDER — COVID-19 MRNA VAC-TRIS(PFIZER) 30 MCG/0.3ML IM SUSY
0.3000 mL | PREFILLED_SYRINGE | Freq: Once | INTRAMUSCULAR | 0 refills | Status: AC
  Filled 2023-09-25: qty 0.3, 1d supply, fill #0

## 2023-09-29 ENCOUNTER — Encounter: Payer: Self-pay | Admitting: Family

## 2023-09-29 ENCOUNTER — Ambulatory Visit: Payer: 59 | Admitting: Family

## 2023-09-29 ENCOUNTER — Telehealth: Payer: Self-pay

## 2023-09-29 VITALS — BP 122/84 | HR 88 | Resp 18 | Ht 71.0 in | Wt 234.4 lb

## 2023-09-29 DIAGNOSIS — E119 Type 2 diabetes mellitus without complications: Secondary | ICD-10-CM | POA: Diagnosis not present

## 2023-09-29 DIAGNOSIS — Z7985 Long-term (current) use of injectable non-insulin antidiabetic drugs: Secondary | ICD-10-CM

## 2023-09-29 MED ORDER — TIRZEPATIDE 2.5 MG/0.5ML ~~LOC~~ SOAJ: 2.5000 mg | SUBCUTANEOUS | 0 refills | Status: DC

## 2023-09-29 NOTE — Telephone Encounter (Signed)
PA initiated via Covermymeds; KEY: [id number].   PA approved.   [id number];Review Type:Prior Auth;Coverage Start Date:08/30/2023;Coverage End Date:09/28/2024; Authorization Expiration Date: 09/27/2024

## 2023-09-29 NOTE — Progress Notes (Signed)
Mary [name] is a 62 y.o. female with the following history as recorded in EpicCare:  Patient Active Problem List   Diagnosis Date Noted   Status post sleeve gastrectomy 09/22/2022   Left trimalleolar fracture, closed, initial encounter 12/18/2020   Left ankle pain 12/18/2020   Labial cyst 03/05/2018   Chest pain 05/30/2017   Chronic nonintractable headache 05/04/2017   Ankle edema, bilateral 05/04/2017   Cellulitis 04/04/2016   Abnormal x-ray 04/04/2016   Hearing loss due to cerumen impaction 01/14/2016   Impaired fasting blood sugar 01/14/2016   Plantar fasciitis, right 11/22/2015   Right calcaneal fracture 08/15/2015   OSA (obstructive sleep apnea) 09/26/2014   Peripheral neuropathy due to chemotherapy (HCC) 06/05/2014   Obesity 06/05/2014   history of DVT (deep venous thrombosis) 02/20/2014   GERD (gastroesophageal reflux disease) 02/20/2014   Hernia 09/22/2012   Iron deficiency anemia, unspecified 08/30/2011   Colon cancer (HCC) 08/29/2011   Cancer of descending colon (HCC) 05/12/2011    Current Outpatient Medications  Medication Sig Dispense Refill   ACCU-CHEK GUIDE test strip USE   TO CHECK GLUCOSE UP TO 4 TIMES DAILY 100 each 0   Accu-Chek Softclix Lancets lancets USE   TO CHECK GLUCOSE UP TO 4 TIMES DAILY 100 each 0   amLODipine (NORVASC) 5 MG tablet Take 1 tablet by mouth once daily 90 tablet 0   blood glucose meter kit and supplies KIT Dispense based on patient and insurance preference. Use up to four times daily as directed. 1 each 0   gabapentin (NEURONTIN) 300 MG capsule TAKE 1 CAPSULE BY MOUTH THREE TIMES DAILY 90 capsule 0   IRON, FERROUS SULFATE, PO Take 1 tablet by mouth daily.     oxyCODONE (OXY IR/ROXICODONE) 5 MG immediate release tablet Take 1 tablet (5 mg total) by mouth every 6 (six) hours as needed for severe pain. 20 tablet 0   Oxycodone HCl 10 MG TABS Take 10 mg by mouth 4 (four) times daily as needed (pain.).     pantoprazole (PROTONIX) 40 MG tablet  Take 40 mg by mouth 2 (two) times daily.     tirzepatide [facility]) 2.5 MG/0.5ML Pen Inject 2.5 mg into the skin once a week. 2 mL 0   enoxaparin (LOVENOX) 40 MG/0.4ML injection Inject 0.4 mLs (40 mg total) into the skin every 12 (twelve) hours. 24 mL 0   ondansetron (ZOFRAN-ODT) 4 MG disintegrating tablet Dissolve 1 tablet (4 mg total) by mouth every 6 (six) hours as needed for nausea or vomiting. 20 tablet 0   Current Facility-Administered Medications  Medication Dose Route Frequency Provider Last Rate Last Admin   cyanocobalamin ((VITAMIN B-12)) injection 1,000 mcg  1,000 mcg Intramuscular Once Olive Bass, FNP       cyanocobalamin ((VITAMIN B-12)) injection 1,000 mcg  1,000 mcg Intramuscular Q30 days Olive Bass, FNP   1,000 mcg at 05/14/22 4098    Allergies: Tylenol [acetaminophen]  Past Medical History:  Diagnosis Date   Adenocarcinoma sigmoid colon 05/12/2011   Stage III(pT2pN2b) descending sigmoid   Arm DVT (deep venous thromboembolism), acute (HCC) 07/04/2011   r/t PAC   Blood transfusion    Cancer of descending colon (HCC) 05/12/2011   Diverticulitis    GERD (gastroesophageal reflux disease)    History of migraine headaches    Hypertension    Incisional hernia    iron deficiency anemia    Multiple hemangiomas    Liver   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 w/prolonged cold sensitivity--Oxaliplatin related   Pneumothorax 07/19/2011   Bilateral   Pre-diabetes    Renal cysts, acquired, bilateral    Sleep apnea    does not use CPAP on regular basis    Past Surgical History:  Procedure Laterality Date   ABDOMINAL HYSTERECTOMY  mid 20s   bleeding   BREAST BIOPSY Right 08/2021   COLON SURGERY  05/17/2011   Partial colectomy   COLONOSCOPY  09/04/2014   One diminutive polyp at the recto sigmoid colon removed. Diverticulosis of the entire examined colon. Internal hemorrhoids.    HERNIA REPAIR  09/06/2012   lap incisional hernia    INSERTION OF MESH  09/06/2012   Procedure: INSERTION OF MESH;  Surgeon: Cherylynn Ridges, MD;  Location: [facility];  Service: General;  Laterality: N/A;   ORIF ANKLE FRACTURE Left 12/27/2020   Procedure: OPEN REDUCTION INTERNAL FIXATION (ORIF) Left ankle trimalleolar fracture dislocation;  Surgeon: Toni Arthurs, MD;  Location: [facility];  Service: Orthopedics;  Laterality: Left;    PORT-A-CATH REMOVAL  05/06/2012   Procedure: MINOR REMOVAL PORT-A-CATH;  Surgeon: Cherylynn Ridges, MD;  Location: [facility];  Service: General;  Laterality: Right;   PORTACATH PLACEMENT  07/20/2011   Right side placed   PORTACATH PLACEMENT     left side & right side   UPPER GI ENDOSCOPY N/A 09/22/2022   Procedure: UPPER GI ENDOSCOPY;  Surgeon: Luretha Murphy, MD;  Location: [facility];  Service: General;  Laterality: N/A;   VENTRAL HERNIA REPAIR  09/06/2012   Procedure: LAPAROSCOPIC VENTRAL HERNIA;  Surgeon: Cherylynn Ridges, MD;  Location: [facility];  Service: General;  Laterality: N/A;    Family History  Problem Relation Age of Onset   Diabetes Mother    Hypertension Mother    Cancer Father        leukemia   Cancer Maternal Uncle        lung   Cancer Maternal Uncle        colon   Colitis Maternal Uncle    Cancer Maternal Uncle        prostate   Breast cancer Cousin 1       paternal first cousin   Cancer Brother        PROSTATE   Other Brother        MVA   Esophageal cancer Neg Hx    Rectal cancer Neg Hx    Stomach cancer Neg Hx     Social History   Tobacco Use   Smoking status: Never   Smokeless tobacco: Never  Substance Use Topics   Alcohol use: No    Subjective:   History of diet controlled Type 2 Diabetes- last Hgba1c in November 2023 was at 6.4 but patient's hgba1c has been has high as 7.2 in the past ( 12/2021); would like to discuss trial of Mounjaro to help with weight loss goals; may need to have some orthopedic surgery ( knee and ankle replacement) and knows  that weight loss will be beneficial for surgery outcome.   Objective:  Vitals:   09/29/23 1440  BP: 122/84  Pulse: 88  Resp: 18  SpO2: 97%  Weight: 234 lb 6.4 oz (106.3 kg)  Height: 5\' 11"  (1.803 m)    General: Well developed, well nourished, in no acute distress  Skin : Warm and dry.  Head: Normocephalic and atraumatic  Lungs: Respirations unlabored; clear to auscultation bilaterally without wheeze, rales, rhonchi  CVS exam:  normal rate and regular rhythm.  Neurologic: Alert and oriented; speech intact; face symmetrical; moves all extremities well; CNII-XII intact without focal deficit   Assessment:  1. Diet-controlled diabetes mellitus (HCC)     Plan:  Patient requesting trial of GLP-1; CBC, CMP were done last month with nephrologist; will update Hgba1c today; prescription for [facility] sent to the pharmacy; follow up to be determined;  Risks/ benefits of medication were discussed;  No follow-ups on file.  Orders Placed This Encounter  Procedures   Hemoglobin A1c   Urine Microalbumin w/creat. ratio    Requested Prescriptions   Signed Prescriptions Disp Refills   tirzepatide (MOUNJARO) 2.5 MG/0.5ML Pen 2 mL 0    Sig: Inject 2.5 mg into the skin once a week.

## 2023-09-30 ENCOUNTER — Telehealth: Payer: Self-pay | Admitting: Family

## 2023-09-30 LAB — MICROALBUMIN / CREATININE URINE RATIO
Creatinine,U: 202 mg/dL
Microalb Creat Ratio: 1.8 mg/g (ref 0.0–30.0)
Microalb, Ur: 3.7 mg/dL — ABNORMAL HIGH (ref 0.0–1.9)

## 2023-09-30 LAB — HEMOGLOBIN A1C: Hgb A1c MFr Bld: 6.5 % (ref 4.6–6.5)

## 2023-09-30 NOTE — Telephone Encounter (Signed)
Pt called back to follow up. Advised that PA was initiated yesterday at  4 pm. Advised once we get a response, the pharmacy should be able to fill the prescription.

## 2023-09-30 NOTE — Telephone Encounter (Signed)
Patient called and states pharmacy is awaiting authorization from provider for tirzepatide [facility]) 2.5 MG/0.5ML Pen

## 2023-10-20 ENCOUNTER — Telehealth: Payer: Self-pay | Admitting: Family

## 2023-10-20 ENCOUNTER — Encounter: Payer: Self-pay | Admitting: Family

## 2023-10-20 ENCOUNTER — Other Ambulatory Visit: Payer: Self-pay | Admitting: Family

## 2023-10-20 NOTE — Telephone Encounter (Signed)
Prescription Request  10/20/2023  Is this a "Controlled Substance" medicine? No  LOV: 09/29/2023  What is the name of the medication or equipment? tirzepatide [facility]) 2.5 MG/0.5ML Pen  Have you contacted your pharmacy to request a refill? No   Which pharmacy would you like this sent to?  [facility] [address] ([address] [place] - [address] [place]) [place] [address] Phone: [phone] Fax: [phone]    Patient notified that their request is being sent to the clinical staff for review and that they should receive a response within 2 business days.   Please advise at [facility] [phone]

## 2023-10-21 NOTE — Telephone Encounter (Signed)
Pt called stating that the mounjaro is going well and she is ok to increase it at this time.

## 2023-10-22 ENCOUNTER — Telehealth: Payer: Self-pay

## 2023-10-22 ENCOUNTER — Other Ambulatory Visit: Payer: Self-pay | Admitting: Family

## 2023-10-22 MED ORDER — TIRZEPATIDE 5 MG/0.5ML ~~LOC~~ SOAJ: 5.0000 mg | SUBCUTANEOUS | 0 refills | Status: DC

## 2023-10-22 NOTE — Telephone Encounter (Signed)
Caller Name [name] Caller Phone Number [phone] Patient Name Mary [name] Patient DOB [birth year] Call Type Message Only Information Provided Reason for Call Request for General Office Information Initial Comment Caller states she needs to check on her prescriptions Disp. Time Disposition Final User 10/21/2023 5:12:56 PM General Information Provided Yes [name] Call Closed By: [name] Transaction Date/Time: 10/21/2023 5:10:53 PM (ET)

## 2023-10-26 NOTE — Telephone Encounter (Signed)
Called pt and left a VM asking pt to give the office a call back.

## 2023-10-26 NOTE — Telephone Encounter (Signed)
Spoke with pt, pt states she was able to pick up her medication 10/25/2023.

## 2023-10-28 ENCOUNTER — Ambulatory Visit: Payer: 59

## 2023-10-30 ENCOUNTER — Ambulatory Visit
Admission: RE | Admit: 2023-10-30 | Discharge: 2023-10-30 | Disposition: A | Discharge status: Home / Self Care | Payer: 59 | Source: Ambulatory Visit | Attending: Family | Admitting: Family

## 2023-10-30 DIAGNOSIS — Z1231 Encounter for screening mammogram for malignant neoplasm of breast: Secondary | ICD-10-CM

## 2023-11-14 ENCOUNTER — Other Ambulatory Visit: Payer: Self-pay | Admitting: Family

## 2023-11-20 ENCOUNTER — Other Ambulatory Visit: Payer: Self-pay | Admitting: Family

## 2023-12-09 ENCOUNTER — Other Ambulatory Visit: Payer: Self-pay | Admitting: Family

## 2024-01-04 ENCOUNTER — Other Ambulatory Visit: Payer: Self-pay | Admitting: Family

## 2024-01-08 ENCOUNTER — Other Ambulatory Visit: Payer: Self-pay | Admitting: Family

## 2024-01-08 NOTE — Telephone Encounter (Signed)
 Last Fill: 01/04/24 52mL/0 RF  Last OV: 09/29/23 Next OV: None Scheduled  Routing to provider for review/authorization.

## 2024-01-08 NOTE — Telephone Encounter (Signed)
 Copied from CRM [id number]. Topic: Clinical - Medication Refill >> Jan 08, 2024 11:57 AM Truddie Crumble wrote: Most Recent Primary Care Visit:  Provider: [facility]  Department: [facility]  Visit Type: OFFICE VISIT  Date: 09/29/2023  Medication: mounjaro  Has the patient contacted their pharmacy? Yes (Agent: If no, request that the patient contact the pharmacy for the refill. If patient does not wish to contact the pharmacy document the reason why and proceed with request.) (Agent: If yes, when and what did the pharmacy advise?)  Is this the correct pharmacy for this prescription? Yes If no, delete pharmacy and type the correct one.  This is the patient's preferred pharmacy:   [facility] on [address] in [place]  Has the prescription been filled recently? Yes  Is the patient out of the medication? Yes  Has the patient been seen for an appointment in the last year OR does the patient have an upcoming appointment? Yes  Can we respond through MyChart? Yes  Agent: Please be advised that Rx refills may take up to 3 business days. We ask that you follow-up with your pharmacy.

## 2024-01-14 ENCOUNTER — Telehealth: Payer: Self-pay

## 2024-01-14 NOTE — Telephone Encounter (Signed)
 Copied from CRM [id number]. Topic: Clinical - Prescription Issue >> Jan 14, 2024  4:17 PM Fredrich Romans wrote: Reason for CRM: Patient needs medication  tirzepatide [facility]) 5 MG/0.5ML Pen  Sent to  [id number] [place], [place] - [address] AT [facility] & [address]  Phone: [phone] Fax: [phone]  Because [facility] doesn't cover medication anymore.

## 2024-01-15 ENCOUNTER — Telehealth: Payer: Self-pay

## 2024-01-15 ENCOUNTER — Other Ambulatory Visit: Payer: Self-pay | Admitting: Family

## 2024-01-15 ENCOUNTER — Telehealth: Payer: Self-pay | Admitting: Family

## 2024-01-15 ENCOUNTER — Other Ambulatory Visit: Payer: Self-pay

## 2024-01-15 MED ORDER — MOUNJARO 5 MG/0.5ML ~~LOC~~ SOAJ: 5.0000 mg | SUBCUTANEOUS | 1 refills | Status: DC

## 2024-01-15 NOTE — Telephone Encounter (Signed)
 Rx has been sent in. Pt is aware and expressed understanding.

## 2024-01-15 NOTE — Telephone Encounter (Signed)
 Copied from CRM [id number]. Topic: Clinical - Prescription Issue >> Jan 15, 2024 11:46 AM Lennart Pall wrote: Reason for CRM: Patient needs to have a new prescription for [facility] sent and they need the diagnosis code with it [facility][id number] [place],  - [address] AT [facility] OF [facility] & [address] [place] [address] Phone: [phone] Fax: [phone] >> Jan 15, 2024  1:52 PM [place] J wrote: Patient calling back to check on update regarding ICD-9 code needed for prescription. I let patient no that we are still waiting for the provider to provide that information.

## 2024-01-15 NOTE — Telephone Encounter (Signed)
 Copied from CRM [id number]. Topic: Clinical - Prescription Issue >> Jan 15, 2024 11:46 AM Lennart Pall wrote: Reason for CRM: Patient needs to have a new prescription for [facility] sent and they need the diagnosis code with it [facility] #[id number] [place] Otto, [place] - [address] AT [facility] OF [facility] & [address] [place] [address] Phone: [phone] Fax: [phone] >> Jan 15, 2024  3:30 PM Elizebeth Brooking wrote: Patient called back stated that she has spoken with her pharmacy and they stated they still have not received the medication. Patient would like for nurse to give pharmacy a call  >> Jan 15, 2024  1:52 PM [place] J wrote: Patient calling back to check on update regarding ICD-9 code needed for prescription. I let patient no that we are still waiting for the provider to provide that information.

## 2024-01-15 NOTE — Telephone Encounter (Signed)
 Copied from CRM [id number]. Topic: Clinical - Prescription Issue >> Jan 15, 2024 11:46 AM Lennart Pall wrote: Reason for CRM: Patient needs to have a new prescription for [facility] sent and they need the diagnosis code with it [id number] [place], [place] - [address] AT [facility] OF [facility] & [address] [place] [address] Phone: [phone] Fax: [phone]

## 2024-01-15 NOTE — Telephone Encounter (Signed)
 Rx has been sent in again. Called pt, pt is aware and expressed understanding.

## 2024-01-15 NOTE — Telephone Encounter (Signed)
 Copied from CRM [id number]. Topic: Clinical - Prescription Issue >> Jan 15, 2024  8:24 AM Truddie Crumble wrote: Reason for CRM: patient called stating she was given a coupon from her doctor for [facility], but her pharmacy need a ICD 9 number for the mounjaro in order for her to get it

## 2024-01-18 ENCOUNTER — Telehealth: Payer: Self-pay

## 2024-01-18 MED ORDER — MOUNJARO 5 MG/0.5ML ~~LOC~~ SOAJ: 5.0000 mg | SUBCUTANEOUS | 1 refills | Status: DC

## 2024-01-18 NOTE — Telephone Encounter (Signed)
 Called patient to let her know that [facility] did have rx.  She stated that it should have been sent over to [facility].  Called [facility] cause it looks like it was sent.  They stated that they need new rx with dx code.  Patient notified that rx was sent over to correct pharmacy and to let know if she needs anything else.

## 2024-01-18 NOTE — Addendum Note (Signed)
 Addended by: Thelma Barge D on: 01/18/2024 03:55 PM   Modules accepted: Orders

## 2024-01-18 NOTE — Telephone Encounter (Signed)
 Copied from CRM [id number]. Topic: Clinical - Prescription Issue >> Jan 18, 2024 11:27 AM Elizebeth Brooking wrote: Reason for CRM: Patient called in stating that the pharmacy has still not received the prescription refill for tirzepatide [facility]) 5 MG/0.5ML Pen . Patient stated that the pharmacy stated that someone from the office will need to call in order to get this refilled. Patient also ask if someone can give her a callback when it has been completed

## 2024-01-19 ENCOUNTER — Telehealth: Payer: Self-pay

## 2024-01-19 MED ORDER — MOUNJARO 5 MG/0.5ML ~~LOC~~ SOAJ: 5.0000 mg | SUBCUTANEOUS | 0 refills | Status: DC

## 2024-01-19 NOTE — Telephone Encounter (Signed)
 Copied from CRM [id number]. Topic: Clinical - Prescription Issue >> Jan 19, 2024  2:59 PM Gurney Maxin H wrote: Reason for CRM: Patient is calling stating that a 1 month supply of the tirzepatide [facility]) 5 MG/0.5ML Pen was sent to [facility] on 3/10 and patient is stating that a 3 month supply needs [facility]. Patient also states that she is out of medication and if someone can reach out to her to verify when prescription will be sent, thanks.  Jaylissa [phone]

## 2024-01-19 NOTE — Telephone Encounter (Signed)
 [facility] and as 90 day supply.

## 2024-01-20 NOTE — Telephone Encounter (Signed)
 Rx was sent in again 01/19/2024.

## 2024-01-26 ENCOUNTER — Telehealth: Payer: Self-pay

## 2024-01-26 NOTE — Telephone Encounter (Signed)
 Copied from CRM [id number]. Topic: Clinical - Medication Question >> Jan 26, 2024  2:56 PM Kathryne Eriksson wrote: Reason for CRM: tirzepatide [facility]) 5 MG/0.5ML Pen >> Jan 26, 2024  2:58 PM Kathryne Eriksson wrote: Patient is requesting to have her medication dosage increased prior to Olive Bass, FNP completing her refill. Patient states she also [facility] her [id number] [place], [place] - [address] [phone]

## 2024-01-27 NOTE — Telephone Encounter (Signed)
Mychart message has also been sent to pt

## 2024-01-27 NOTE — Telephone Encounter (Signed)
 Pt called back and spoke with [employer] agent Nelida Meuse who advised pt her Rx had been sent in to the pharmacy requested as well as a 90 day supply as requested by pt.

## 2024-01-27 NOTE — Telephone Encounter (Signed)
 Called pt twice, with no answer was not able to leave a VM.

## 2024-01-27 NOTE — Telephone Encounter (Signed)
 Pt also scheduled a follow up with PCP on 02/02/2024.

## 2024-02-02 ENCOUNTER — Encounter: Payer: Self-pay | Admitting: Family

## 2024-02-02 ENCOUNTER — Ambulatory Visit: Admitting: Family

## 2024-02-02 VITALS — BP 134/86 | HR 89 | Ht 71.0 in | Wt 212.4 lb

## 2024-02-02 DIAGNOSIS — E559 Vitamin D deficiency, unspecified: Secondary | ICD-10-CM | POA: Diagnosis not present

## 2024-02-02 DIAGNOSIS — Z7985 Long-term (current) use of injectable non-insulin antidiabetic drugs: Secondary | ICD-10-CM | POA: Diagnosis not present

## 2024-02-02 DIAGNOSIS — R252 Cramp and spasm: Secondary | ICD-10-CM

## 2024-02-02 DIAGNOSIS — E1122 Type 2 diabetes mellitus with diabetic chronic kidney disease: Secondary | ICD-10-CM | POA: Diagnosis not present

## 2024-02-02 NOTE — Progress Notes (Signed)
 Mary [name] is a 63 y.o. female with the following history as recorded in EpicCare:  Patient Active Problem List   Diagnosis Date Noted   Status post sleeve gastrectomy 09/22/2022   Left trimalleolar fracture, closed, initial encounter 12/18/2020   Left ankle pain 12/18/2020   Labial cyst 03/05/2018   Chest pain 05/30/2017   Chronic nonintractable headache 05/04/2017   Ankle edema, bilateral 05/04/2017   Cellulitis 04/04/2016   Abnormal x-ray 04/04/2016   Hearing loss due to cerumen impaction 01/14/2016   Impaired fasting blood sugar 01/14/2016   Plantar fasciitis, right 11/22/2015   Right calcaneal fracture 08/15/2015   OSA (obstructive sleep apnea) 09/26/2014   Peripheral neuropathy due to chemotherapy (HCC) 06/05/2014   Obesity 06/05/2014   history of DVT (deep venous thrombosis) 02/20/2014   GERD (gastroesophageal reflux disease) 02/20/2014   Hernia 09/22/2012   Iron deficiency anemia, unspecified 08/30/2011   Colon cancer (HCC) 08/29/2011   Cancer of descending colon (HCC) 05/12/2011    Current Outpatient Medications  Medication Sig Dispense Refill   ACCU-CHEK GUIDE test strip USE   TO CHECK GLUCOSE UP TO 4 TIMES DAILY 100 each 0   Accu-Chek Softclix Lancets lancets USE   TO CHECK GLUCOSE UP TO 4 TIMES DAILY 100 each 0   amLODipine (NORVASC) 5 MG tablet Take 1 tablet by mouth once daily 90 tablet 0   blood glucose meter kit and supplies KIT Dispense based on patient and insurance preference. Use up to four times daily as directed. 1 each 0   gabapentin (NEURONTIN) 300 MG capsule TAKE 1 CAPSULE BY MOUTH THREE TIMES DAILY 90 capsule 0   IRON, FERROUS SULFATE, PO Take 1 tablet by mouth daily.     oxyCODONE (OXY IR/ROXICODONE) 5 MG immediate release tablet Take 1 tablet (5 mg total) by mouth every 6 (six) hours as needed for severe pain. 20 tablet 0   Oxycodone HCl 10 MG TABS Take 10 mg by mouth 4 (four) times daily as needed (pain.).     pantoprazole (PROTONIX) 40 MG tablet  Take 40 mg by mouth 2 (two) times daily.     tirzepatide [facility]) 5 MG/0.5ML Pen Inject 5 mg into the skin once a week. Dx Code: E11.9 6 mL 0   Current Facility-Administered Medications  Medication Dose Route Frequency Provider Last Rate Last Admin   cyanocobalamin ((VITAMIN B-12)) injection 1,000 mcg  1,000 mcg Intramuscular Once Olive Bass, FNP       cyanocobalamin ((VITAMIN B-12)) injection 1,000 mcg  1,000 mcg Intramuscular Q30 days Olive Bass, FNP   1,000 mcg at 05/14/22 4782    Allergies: Tylenol [acetaminophen]  Past Medical History:  Diagnosis Date   Adenocarcinoma sigmoid colon 05/12/2011   Stage III(pT2pN2b) descending sigmoid   Arm DVT (deep venous thromboembolism), acute (HCC) 07/04/2011   r/t PAC   Blood transfusion    Cancer of descending colon (HCC) 05/12/2011   Diverticulitis    GERD (gastroesophageal reflux disease)    History of migraine headaches    Hypertension    Incisional hernia    iron deficiency anemia    Multiple hemangiomas    Liver   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 w/prolonged cold sensitivity--Oxaliplatin related   Pneumothorax 07/19/2011   Bilateral   Pre-diabetes    Renal cysts, acquired, bilateral    Sleep apnea    does not use CPAP on regular basis    Past Surgical History:  Procedure Laterality Date  ABDOMINAL HYSTERECTOMY  mid 20s   bleeding   BREAST BIOPSY Right 08/2021   COLON SURGERY  05/17/2011   Partial colectomy   COLONOSCOPY  09/04/2014   One diminutive polyp at the recto sigmoid colon removed. Diverticulosis of the entire examined colon. Internal hemorrhoids.    HERNIA REPAIR  09/06/2012   lap incisional hernia   INSERTION OF MESH  09/06/2012   Procedure: INSERTION OF MESH;  Surgeon: Cherylynn Ridges, MD;  Location: [facility];  Service: General;  Laterality: N/A;   ORIF ANKLE FRACTURE Left 12/27/2020   Procedure: OPEN REDUCTION INTERNAL FIXATION (ORIF) Left ankle trimalleolar fracture  dislocation;  Surgeon: Toni Arthurs, MD;  Location: [facility];  Service: Orthopedics;  Laterality: Left;    PORT-A-CATH REMOVAL  05/06/2012   Procedure: MINOR REMOVAL PORT-A-CATH;  Surgeon: Cherylynn Ridges, MD;  Location: [facility];  Service: General;  Laterality: Right;   PORTACATH PLACEMENT  07/20/2011   Right side placed   PORTACATH PLACEMENT     left side & right side   UPPER GI ENDOSCOPY N/A 09/22/2022   Procedure: UPPER GI ENDOSCOPY;  Surgeon: Luretha Murphy, MD;  Location: [facility];  Service: General;  Laterality: N/A;   VENTRAL HERNIA REPAIR  09/06/2012   Procedure: LAPAROSCOPIC VENTRAL HERNIA;  Surgeon: Cherylynn Ridges, MD;  Location: [facility];  Service: General;  Laterality: N/A;    Family History  Problem Relation Age of Onset   Diabetes Mother    Hypertension Mother    Cancer Father        leukemia   Cancer Maternal Uncle        lung   Cancer Maternal Uncle        colon   Colitis Maternal Uncle    Cancer Maternal Uncle        prostate   Breast cancer Cousin 88       paternal first cousin   Cancer Brother        PROSTATE   Other Brother        MVA   Esophageal cancer Neg Hx    Rectal cancer Neg Hx    Stomach cancer Neg Hx     Social History   Tobacco Use   Smoking status: Never   Smokeless tobacco: Never  Substance Use Topics   Alcohol use: No    Subjective:   Follow up on [facility]; was started on medication in November 2024; has lost 22 pounds to date; complaining today of "cramping" all over but patient notes these symptoms have been present "for a while"- before she started the medication; notes that symptoms have been present "for years."/ does have history of B12 deficiency- not currently taking a B12 supplement.   Objective:  Vitals:   02/02/24 1307  BP: 134/86  Pulse: 89  SpO2: 100%  Weight: 212 lb 6.4 oz (96.3 kg)  Height: 5\' 11"  (1.803 m)    General: Well developed, well nourished, in no acute distress  Skin :  Warm and dry.  Head: Normocephalic and atraumatic  Lungs: Respirations unlabored; clear to auscultation bilaterally without wheeze, rales, rhonchi  [facility]S exam: normal rate and regular rhythm.  Neurologic: Alert and oriented; speech intact; face symmetrical; moves all extremities well; CNII-XII intact without focal deficit   Assessment:  1. Type 2 diabetes mellitus with chronic kidney disease, without long-term current use of insulin, unspecified CKD stage (HCC)   2. Muscle cramps   3. Vitamin D deficiency  Plan:  Good response to [facility]- will update labs today; follow up in 6 months, sooner prn.   No follow-ups on file.  Orders Placed This Encounter  Procedures   CBC with Differential/Platelet   Comp Met (CMET)   Hemoglobin A1c   Magnesium   B12   Vitamin D (25 hydroxy)    Requested Prescriptions    No prescriptions requested or ordered in this encounter

## 2024-02-03 LAB — CBC WITH DIFFERENTIAL/PLATELET
Basophils Absolute: 0.1 10*3/uL (ref 0.0–0.1)
Basophils Relative: 1.1 % (ref 0.0–3.0)
Eosinophils Absolute: 0.1 10*3/uL (ref 0.0–0.7)
Eosinophils Relative: 0.9 % (ref 0.0–5.0)
HCT: 43.3 % (ref 36.0–46.0)
Hemoglobin: 13.4 g/dL (ref 12.0–15.0)
Lymphocytes Relative: 20.1 % (ref 12.0–46.0)
Lymphs Abs: 1.7 10*3/uL (ref 0.7–4.0)
MCHC: 30.8 g/dL (ref 30.0–36.0)
MCV: 70.6 fl — ABNORMAL LOW (ref 78.0–100.0)
Monocytes Absolute: 0.7 10*3/uL (ref 0.1–1.0)
Monocytes Relative: 7.9 % (ref 3.0–12.0)
Neutro Abs: 5.8 10*3/uL (ref 1.4–7.7)
Neutrophils Relative %: 70 % (ref 43.0–77.0)
Platelets: 224 10*3/uL (ref 150.0–400.0)
RBC: 6.14 Mil/uL — ABNORMAL HIGH (ref 3.87–5.11)
RDW: 17.5 % — ABNORMAL HIGH (ref 11.5–15.5)
WBC: 8.3 10*3/uL (ref 4.0–10.5)

## 2024-02-03 LAB — COMPREHENSIVE METABOLIC PANEL WITH GFR
ALT: 11 U/L (ref 0–35)
AST: 13 U/L (ref 0–37)
Albumin: 3.6 g/dL (ref 3.5–5.2)
Alkaline Phosphatase: 78 U/L (ref 39–117)
BUN: 27 mg/dL — ABNORMAL HIGH (ref 6–23)
CO2: 28 meq/L (ref 19–32)
Calcium: 9.4 mg/dL (ref 8.4–10.5)
Chloride: 104 meq/L (ref 96–112)
Creatinine, Ser: 1.75 mg/dL — ABNORMAL HIGH (ref 0.40–1.20)
GFR: 30.83 mL/min — ABNORMAL LOW (ref >60.00)
Glucose, Bld: 81 mg/dL (ref 70–99)
Potassium: 4.3 meq/L (ref 3.5–5.1)
Sodium: 140 meq/L (ref 135–145)
Total Bilirubin: 0.4 mg/dL (ref 0.2–1.2)
Total Protein: 5.6 g/dL — ABNORMAL LOW (ref 6.0–8.3)

## 2024-02-03 LAB — HEMOGLOBIN A1C: Hgb A1c MFr Bld: 5.9 % (ref 4.6–6.5)

## 2024-02-03 LAB — MAGNESIUM: Magnesium: 1.8 mg/dL (ref 1.5–2.5)

## 2024-02-04 LAB — VITAMIN B12: Vitamin B-12: 181 pg/mL — ABNORMAL LOW (ref 211–911)

## 2024-02-04 LAB — VITAMIN D 25 HYDROXY (VIT D DEFICIENCY, FRACTURES): VITD: 12.02 ng/mL — ABNORMAL LOW (ref 30.00–100.00)

## 2024-02-05 ENCOUNTER — Other Ambulatory Visit: Payer: Self-pay | Admitting: Family

## 2024-02-05 MED ORDER — VITAMIN D (ERGOCALCIFEROL) 1.25 MG (50000 UNIT) PO CAPS: 50000.0000 [IU] | ORAL_CAPSULE | ORAL | 0 refills | Status: AC

## 2024-02-09 ENCOUNTER — Ambulatory Visit (INDEPENDENT_AMBULATORY_CARE_PROVIDER_SITE_OTHER): Admitting: Emergency Medicine

## 2024-02-09 DIAGNOSIS — R7989 Other specified abnormal findings of blood chemistry: Secondary | ICD-10-CM | POA: Diagnosis not present

## 2024-02-09 MED ORDER — CYANOCOBALAMIN 1000 MCG/ML IJ SOLN
1000.0000 ug | Freq: Once | INTRAMUSCULAR | Status: AC
  Administered 2024-02-09: 1000 ug via INTRAMUSCULAR

## 2024-02-09 NOTE — Progress Notes (Signed)
 Patient here for monthly b12 injection per physicians order.  Injection given in left deltoid and patient tolerated well.

## 2024-02-16 ENCOUNTER — Ambulatory Visit (INDEPENDENT_AMBULATORY_CARE_PROVIDER_SITE_OTHER)

## 2024-02-16 DIAGNOSIS — R7989 Other specified abnormal findings of blood chemistry: Secondary | ICD-10-CM

## 2024-02-16 MED ORDER — CYANOCOBALAMIN 1000 MCG/ML IJ SOLN
1000.0000 ug | Freq: Once | INTRAMUSCULAR | Status: AC
  Administered 2024-02-16: 1000 ug via INTRAMUSCULAR

## 2024-02-16 NOTE — Progress Notes (Signed)
 Mary [name] is a 63 y.o. female presents to the office today for 2/4 weekly B12 injection, per physician's orders. Original order: 02/02/24: "B12 level is low again- would recommend re-starting B12 injections for at least 4 weeks initially and then consider doing at least every 2-3 months for maintenance." Cyanocobalamin 1000 mcg/ml IM was administered L Deltoid today. Patient tolerated injection. Patient due for follow up labs/provider appt: PENDING Patient next injection due: 1 week for 3/4, appt made: PENDING  Creft, Melton Alar L

## 2024-02-17 ENCOUNTER — Ambulatory Visit

## 2024-02-23 ENCOUNTER — Ambulatory Visit (INDEPENDENT_AMBULATORY_CARE_PROVIDER_SITE_OTHER)

## 2024-02-23 DIAGNOSIS — R7989 Other specified abnormal findings of blood chemistry: Secondary | ICD-10-CM | POA: Diagnosis not present

## 2024-02-23 MED ORDER — CYANOCOBALAMIN 1000 MCG/ML IJ SOLN
1000.0000 ug | Freq: Once | INTRAMUSCULAR | Status: AC
  Administered 2024-02-23: 1000 ug via INTRAMUSCULAR

## 2024-02-23 NOTE — Progress Notes (Signed)
 Mary [name] is a 63 y.o. female presents to the office today for 3/4 weekly B12 injection, per physician's orders. Original order: 02/02/24: "B12 level is low again- would recommend re-starting B12 injections for at least 4 weeks initially and then consider doing at least every 2-3 months for maintenance." Cyanocobalamin 1000 mcg/ml IM was administered L Deltoid today. Patient tolerated injection. Patient due for follow up labs/provider appt: PENDING Patient next injection due: 1 week for 4/4 weekly B12 injection, appt made: PENDING   DOD: Wanita Gutta, Joannie Muff

## 2024-03-01 ENCOUNTER — Ambulatory Visit (INDEPENDENT_AMBULATORY_CARE_PROVIDER_SITE_OTHER): Admitting: Emergency Medicine

## 2024-03-01 DIAGNOSIS — R7989 Other specified abnormal findings of blood chemistry: Secondary | ICD-10-CM | POA: Diagnosis not present

## 2024-03-01 MED ORDER — CYANOCOBALAMIN 1000 MCG/ML IJ SOLN
1000.0000 ug | Freq: Once | INTRAMUSCULAR | Status: AC
  Administered 2024-03-01: 1000 ug via INTRAMUSCULAR

## 2024-03-01 NOTE — Progress Notes (Signed)
 Patient here for monthly b12 injection per physicians order.  Injection given in left deltoid and patient tolerated well.

## 2024-03-02 ENCOUNTER — Telehealth: Payer: Self-pay | Admitting: Family

## 2024-03-02 NOTE — Telephone Encounter (Signed)
 Form has been printed and placed in PCP folder for review.

## 2024-03-02 NOTE — Telephone Encounter (Signed)
 Copied from CRM [id number]. Topic: General - Other >> Mar 02, 2024  1:21 PM Kita Perish H wrote: Reason for CRM: Patient states [facility] is waiting on the clearance form to be returned to Dr. Rosebud Confer at [facility] that was faxed to office on 3/26. Patient states office will be re faxing form today. Patient states she cannot schedule her surgery until form is returned, please reach out to patient to verify, thanks.  Genevia Kern [phone]  [facility] [phone]

## 2024-03-03 ENCOUNTER — Telehealth: Payer: Self-pay

## 2024-03-03 ENCOUNTER — Other Ambulatory Visit: Payer: Self-pay | Admitting: Orthopedic Surgery

## 2024-03-03 NOTE — Telephone Encounter (Signed)
 Copied from CRM [id number]. Topic: General - Other >> Mar 03, 2024  8:15 AM Adonis Hoot wrote: Reasonk for CRM: Patient would like a phone call one form has been sent back to [facility]

## 2024-03-03 NOTE — Telephone Encounter (Signed)
 Form has been completed and faxed back to [facility]. Called pt back to advise her form has been sent and I have received a fax confirmation. Pt is aware and expressed understanding.

## 2024-03-04 LAB — LAB REPORT - SCANNED
Albumin, Urine POC: 9.3
Calcium: 8.9
Creatinine, POC: 215.5 mg/dL
EGFR: 37
Microalb Creat Ratio: 4
PTH: 62

## 2024-03-17 ENCOUNTER — Other Ambulatory Visit: Payer: Self-pay

## 2024-03-17 ENCOUNTER — Encounter (HOSPITAL_BASED_OUTPATIENT_CLINIC_OR_DEPARTMENT_OTHER): Payer: Self-pay | Admitting: Orthopedic Surgery

## 2024-03-23 ENCOUNTER — Encounter (HOSPITAL_BASED_OUTPATIENT_CLINIC_OR_DEPARTMENT_OTHER)
Admission: RE | Admit: 2024-03-23 | Discharge: 2024-03-23 | Disposition: A | Discharge status: Home / Self Care | Source: Ambulatory Visit | Attending: Orthopedic Surgery | Admitting: Orthopedic Surgery

## 2024-03-23 DIAGNOSIS — Z86718 Personal history of other venous thrombosis and embolism: Secondary | ICD-10-CM | POA: Diagnosis not present

## 2024-03-23 DIAGNOSIS — I10 Essential (primary) hypertension: Secondary | ICD-10-CM | POA: Diagnosis not present

## 2024-03-23 DIAGNOSIS — M19172 Post-traumatic osteoarthritis, left ankle and foot: Secondary | ICD-10-CM | POA: Diagnosis not present

## 2024-03-23 DIAGNOSIS — R7303 Prediabetes: Secondary | ICD-10-CM | POA: Diagnosis not present

## 2024-03-23 DIAGNOSIS — Z01812 Encounter for preprocedural laboratory examination: Secondary | ICD-10-CM | POA: Diagnosis present

## 2024-03-23 DIAGNOSIS — M6702 Short Achilles tendon (acquired), left ankle: Secondary | ICD-10-CM | POA: Diagnosis present

## 2024-03-23 DIAGNOSIS — G4733 Obstructive sleep apnea (adult) (pediatric): Secondary | ICD-10-CM | POA: Diagnosis not present

## 2024-03-23 DIAGNOSIS — K219 Gastro-esophageal reflux disease without esophagitis: Secondary | ICD-10-CM | POA: Diagnosis not present

## 2024-03-23 LAB — SURGICAL PCR SCREEN
MRSA, PCR: NEGATIVE
Staphylococcus aureus: NEGATIVE

## 2024-03-23 NOTE — Anesthesia Preprocedure Evaluation (Signed)
 Anesthesia Evaluation  Patient identified by MRN, date of birth, ID band Patient awake    Reviewed: Allergy & Precautions, H&P , NPO status , Patient's Chart, lab work & pertinent test results  Airway Mallampati: II  TM Distance: >3 FB Neck ROM: Full    Dental  (+) Dental Advisory Given, Edentulous Upper   Pulmonary sleep apnea and Continuous Positive Airway Pressure Ventilation , neg recent URI   Pulmonary exam normal breath sounds clear to auscultation       Cardiovascular hypertension, Pt. on medications Normal cardiovascular exam Rhythm:Regular Rate:Normal     Neuro/Psych  Headaches  negative psych ROS   GI/Hepatic Neg liver ROS,GERD  Medicated and Controlled,,  Endo/Other  negative endocrine ROS    Renal/GU Renal InsufficiencyRenal disease     Musculoskeletal negative musculoskeletal ROS (+)    Abdominal  (+) + obese  Peds  Hematology  (+) Blood dyscrasia, anemia   Anesthesia Other Findings   Reproductive/Obstetrics                             Anesthesia Physical Anesthesia Plan  ASA: 2  Anesthesia Plan: General   Post-op Pain Management: Tylenol  PO (pre-op)*, Toradol IV (intra-op)* and Regional block*   Induction: Intravenous  PONV Risk Score and Plan: 3 and Ondansetron , Dexamethasone  and Midazolam   Airway Management Planned: Oral ETT and LMA  Additional Equipment:   Intra-op Plan:   Post-operative Plan: Extubation in OR  Informed Consent: I have reviewed the patients History and Physical, chart, labs and discussed the procedure including the risks, benefits and alternatives for the proposed anesthesia with the patient or authorized representative who has indicated his/her understanding and acceptance.     Dental advisory given  Plan Discussed with: CRNA  Anesthesia Plan Comments:        Anesthesia Quick Evaluation

## 2024-03-23 NOTE — Progress Notes (Signed)
 Provided pt with presurgical drink and preop instructions. Pt verbalized understanding

## 2024-03-24 ENCOUNTER — Ambulatory Visit (HOSPITAL_BASED_OUTPATIENT_CLINIC_OR_DEPARTMENT_OTHER): Payer: Worker's Compensation | Admitting: Anesthesiology

## 2024-03-24 ENCOUNTER — Ambulatory Visit (HOSPITAL_BASED_OUTPATIENT_CLINIC_OR_DEPARTMENT_OTHER)
Admission: RE | Admit: 2024-03-24 | Discharge: 2024-03-24 | Disposition: A | Discharge status: Home / Self Care | Payer: Worker's Compensation | Attending: Orthopedic Surgery | Admitting: Orthopedic Surgery

## 2024-03-24 ENCOUNTER — Encounter (HOSPITAL_BASED_OUTPATIENT_CLINIC_OR_DEPARTMENT_OTHER): Payer: Self-pay | Admitting: Orthopedic Surgery

## 2024-03-24 ENCOUNTER — Ambulatory Visit (HOSPITAL_COMMUNITY)

## 2024-03-24 ENCOUNTER — Other Ambulatory Visit: Payer: Self-pay

## 2024-03-24 ENCOUNTER — Encounter (HOSPITAL_BASED_OUTPATIENT_CLINIC_OR_DEPARTMENT_OTHER)
Admission: RE | Disposition: A | Discharge status: Home / Self Care | Payer: Self-pay | Source: Home / Self Care | Attending: Orthopedic Surgery

## 2024-03-24 DIAGNOSIS — M19172 Post-traumatic osteoarthritis, left ankle and foot: Secondary | ICD-10-CM | POA: Insufficient documentation

## 2024-03-24 DIAGNOSIS — M6702 Short Achilles tendon (acquired), left ankle: Secondary | ICD-10-CM | POA: Diagnosis not present

## 2024-03-24 DIAGNOSIS — I10 Essential (primary) hypertension: Secondary | ICD-10-CM

## 2024-03-24 DIAGNOSIS — Z86718 Personal history of other venous thrombosis and embolism: Secondary | ICD-10-CM | POA: Insufficient documentation

## 2024-03-24 DIAGNOSIS — G4733 Obstructive sleep apnea (adult) (pediatric): Secondary | ICD-10-CM | POA: Insufficient documentation

## 2024-03-24 DIAGNOSIS — K219 Gastro-esophageal reflux disease without esophagitis: Secondary | ICD-10-CM | POA: Insufficient documentation

## 2024-03-24 DIAGNOSIS — Z01818 Encounter for other preprocedural examination: Secondary | ICD-10-CM

## 2024-03-24 DIAGNOSIS — R7303 Prediabetes: Secondary | ICD-10-CM | POA: Insufficient documentation

## 2024-03-24 HISTORY — PX: TOTAL ANKLE ARTHROPLASTY: SHX811

## 2024-03-24 HISTORY — PX: ACHILLES TENDON LENGTHENING: SHX6455

## 2024-03-24 LAB — GLUCOSE, CAPILLARY
Glucose-Capillary: 71 mg/dL (ref 70–99)
Glucose-Capillary: 83 mg/dL (ref 70–99)

## 2024-03-24 SURGERY — ARTHROPLASTY, ANKLE, TOTAL
Anesthesia: General | Site: Foot | Laterality: Left

## 2024-03-24 MED ORDER — LIDOCAINE 2% (20 MG/ML) 5 ML SYRINGE
INTRAMUSCULAR | Status: AC
  Filled 2024-03-24: qty 5

## 2024-03-24 MED ORDER — OXYCODONE HCL 5 MG PO TABS: 5.0000 mg | ORAL_TABLET | Freq: Once | ORAL | Status: DC | PRN

## 2024-03-24 MED ORDER — FENTANYL CITRATE (PF) 100 MCG/2ML IJ SOLN: 100.0000 ug | Freq: Once | INTRAMUSCULAR | Status: DC

## 2024-03-24 MED ORDER — PROPOFOL 10 MG/ML IV BOLUS
INTRAVENOUS | Status: AC
  Filled 2024-03-24: qty 20

## 2024-03-24 MED ORDER — BUPIVACAINE HCL (PF) 0.5 % IJ SOLN
INTRAMUSCULAR | Status: DC | PRN
  Administered 2024-03-24: 19 mL via PERINEURAL
  Administered 2024-03-24: 11 mL via PERINEURAL

## 2024-03-24 MED ORDER — DROPERIDOL 2.5 MG/ML IJ SOLN
0.6250 mg | Freq: Once | INTRAMUSCULAR | Status: DC | PRN
Start: 2024-03-24 — End: 2024-03-24

## 2024-03-24 MED ORDER — HYDROMORPHONE HCL 1 MG/ML IJ SOLN: 0.2500 mg | INTRAMUSCULAR | Status: DC | PRN

## 2024-03-24 MED ORDER — ALBUMIN HUMAN 5 % IV SOLN
INTRAVENOUS | Status: AC
  Filled 2024-03-24: qty 250

## 2024-03-24 MED ORDER — MIDAZOLAM HCL 2 MG/2ML IJ SOLN
INTRAMUSCULAR | Status: AC
  Filled 2024-03-24: qty 2

## 2024-03-24 MED ORDER — SURGIPHOR WOUND IRRIGATION SYSTEM - OPTIME
TOPICAL | Status: DC | PRN
  Administered 2024-03-24: 450 mL

## 2024-03-24 MED ORDER — LACTATED RINGERS IV SOLN: INTRAVENOUS | Status: DC | PRN

## 2024-03-24 MED ORDER — ONDANSETRON HCL 4 MG/2ML IJ SOLN
INTRAMUSCULAR | Status: DC | PRN
  Administered 2024-03-24: 4 mg via INTRAVENOUS

## 2024-03-24 MED ORDER — FENTANYL CITRATE (PF) 100 MCG/2ML IJ SOLN
INTRAMUSCULAR | Status: AC
  Filled 2024-03-24: qty 2

## 2024-03-24 MED ORDER — ONDANSETRON HCL 4 MG/2ML IJ SOLN
INTRAMUSCULAR | Status: AC
  Filled 2024-03-24: qty 2

## 2024-03-24 MED ORDER — DEXAMETHASONE SODIUM PHOSPHATE 10 MG/ML IJ SOLN
INTRAMUSCULAR | Status: DC | PRN
  Administered 2024-03-24: 5 mg via INTRAVENOUS

## 2024-03-24 MED ORDER — FENTANYL CITRATE (PF) 100 MCG/2ML IJ SOLN
INTRAMUSCULAR | Status: DC | PRN
  Administered 2024-03-24: 50 ug via INTRAVENOUS
  Administered 2024-03-24 (×2): 25 ug via INTRAVENOUS
  Administered 2024-03-24 (×2): 50 ug via INTRAVENOUS

## 2024-03-24 MED ORDER — CEFAZOLIN SODIUM-DEXTROSE 2-3 GM-%(50ML) IV SOLR
INTRAVENOUS | Status: DC | PRN
  Administered 2024-03-24: 2 g via INTRAVENOUS

## 2024-03-24 MED ORDER — OXYCODONE HCL 5 MG/5ML PO SOLN: 5.0000 mg | Freq: Once | ORAL | Status: DC | PRN

## 2024-03-24 MED ORDER — OXYCODONE HCL 5 MG PO TABS: 5.0000 mg | ORAL_TABLET | Freq: Four times a day (QID) | ORAL | 0 refills | Status: AC | PRN

## 2024-03-24 MED ORDER — RIVAROXABAN 10 MG PO TABS
10.0000 mg | ORAL_TABLET | Freq: Every day | ORAL | 0 refills | Status: AC
Start: 2024-03-24 — End: ?

## 2024-03-24 MED ORDER — MEPERIDINE HCL 25 MG/ML IJ SOLN: 6.2500 mg | INTRAMUSCULAR | Status: DC | PRN

## 2024-03-24 MED ORDER — LIDOCAINE HCL (CARDIAC) PF 100 MG/5ML IV SOSY
PREFILLED_SYRINGE | INTRAVENOUS | Status: DC | PRN
  Administered 2024-03-24: 100 mg via INTRAVENOUS

## 2024-03-24 MED ORDER — CEFAZOLIN SODIUM-DEXTROSE 2-4 GM/100ML-% IV SOLN: 2.0000 g | INTRAVENOUS | Status: DC

## 2024-03-24 MED ORDER — CEFAZOLIN SODIUM-DEXTROSE 2-4 GM/100ML-% IV SOLN
INTRAVENOUS | Status: AC
  Filled 2024-03-24: qty 100

## 2024-03-24 MED ORDER — DOCUSATE SODIUM 100 MG PO CAPS: 100.0000 mg | ORAL_CAPSULE | Freq: Two times a day (BID) | ORAL | 0 refills | Status: AC

## 2024-03-24 MED ORDER — PROPOFOL 10 MG/ML IV BOLUS
INTRAVENOUS | Status: DC | PRN
  Administered 2024-03-24: 150 mg via INTRAVENOUS

## 2024-03-24 MED ORDER — VANCOMYCIN HCL 500 MG IV SOLR
INTRAVENOUS | Status: AC
  Filled 2024-03-24: qty 10

## 2024-03-24 MED ORDER — EPHEDRINE SULFATE (PRESSORS) 50 MG/ML IJ SOLN
INTRAMUSCULAR | Status: DC | PRN
  Administered 2024-03-24 (×2): 10 mg via INTRAVENOUS

## 2024-03-24 MED ORDER — SENNA 8.6 MG PO TABS: 2.0000 | ORAL_TABLET | Freq: Two times a day (BID) | ORAL | 0 refills | Status: AC

## 2024-03-24 MED ORDER — PHENYLEPHRINE HCL (PRESSORS) 10 MG/ML IV SOLN
INTRAVENOUS | Status: DC | PRN
  Administered 2024-03-24 (×2): 80 ug via INTRAVENOUS
  Administered 2024-03-24 (×3): 160 ug via INTRAVENOUS
  Administered 2024-03-24 (×2): 80 ug via INTRAVENOUS
  Administered 2024-03-24 (×5): 160 ug via INTRAVENOUS

## 2024-03-24 MED ORDER — ALBUMIN HUMAN 5 % IV SOLN: INTRAVENOUS | Status: DC | PRN

## 2024-03-24 MED ORDER — MIDAZOLAM HCL 2 MG/2ML IJ SOLN
INTRAMUSCULAR | Status: DC | PRN
  Administered 2024-03-24: 2 mg via INTRAVENOUS

## 2024-03-24 MED ORDER — BUPIVACAINE LIPOSOME 1.3 % IJ SUSP
INTRAMUSCULAR | Status: DC | PRN
  Administered 2024-03-24: 6 mL via PERINEURAL
  Administered 2024-03-24: 4 mL via PERINEURAL

## 2024-03-24 MED ORDER — FENTANYL CITRATE (PF) 100 MCG/2ML IJ SOLN
INTRAMUSCULAR | Status: AC
Start: 2024-03-24 — End: ?
  Filled 2024-03-24: qty 2

## 2024-03-24 MED ORDER — DEXAMETHASONE SODIUM PHOSPHATE 10 MG/ML IJ SOLN
INTRAMUSCULAR | Status: AC
  Filled 2024-03-24: qty 1

## 2024-03-24 MED ORDER — 0.9 % SODIUM CHLORIDE (POUR BTL) OPTIME
TOPICAL | Status: DC | PRN
  Administered 2024-03-24: 400 mL

## 2024-03-24 MED ORDER — VANCOMYCIN HCL 500 MG IV SOLR
INTRAVENOUS | Status: DC | PRN
  Administered 2024-03-24: 500 mg via TOPICAL

## 2024-03-24 MED ORDER — MIDAZOLAM HCL 2 MG/2ML IJ SOLN: 2.0000 mg | Freq: Once | INTRAMUSCULAR | Status: DC

## 2024-03-24 MED ORDER — LACTATED RINGERS IV SOLN: INTRAVENOUS | Status: DC

## 2024-03-24 MED ORDER — GLYCOPYRROLATE 0.2 MG/ML IJ SOLN
INTRAMUSCULAR | Status: DC | PRN
  Administered 2024-03-24: .2 mg via INTRAVENOUS

## 2024-03-24 SURGICAL SUPPLY — 71 items
BANDAGE ESMARK 6X9 LF (GAUZE/BANDAGES/DRESSINGS) IMPLANT
BLADE RECIPRO TAPERED (BLADE) ×2 IMPLANT
BLADE SAW RECIP ANKLE 8X50X1 (PIN) IMPLANT
BLADE SURG 15 STRL LF DISP TIS (BLADE) ×6 IMPLANT
BNDG ELASTIC 4INX 5YD STR LF (GAUZE/BANDAGES/DRESSINGS) IMPLANT
CHLORAPREP W/TINT 26 (MISCELLANEOUS) ×2 IMPLANT
CLIP LOCKING VANTAGE SZ 2 (Clip) IMPLANT
COVER BACK TABLE 60X90IN (DRAPES) ×4 IMPLANT
COVER MAYO STAND STRL (DRAPES) ×2 IMPLANT
CUFF TRNQT CYL 34X4.125X (TOURNIQUET CUFF) IMPLANT
DRAPE C-ARM 42X72 X-RAY (DRAPES) ×2 IMPLANT
DRAPE C-ARMOR (DRAPES) ×2 IMPLANT
DRAPE EXTREMITY T 121X128X90 (DISPOSABLE) ×2 IMPLANT
DRAPE IMP U-DRAPE 54X76 (DRAPES) ×2 IMPLANT
DRAPE U-SHAPE 47X51 STRL (DRAPES) ×2 IMPLANT
DRESSING MEPILEX FLEX 4X4 (GAUZE/BANDAGES/DRESSINGS) ×2 IMPLANT
DRSG AQUACEL AG ADV 3.5X10 (GAUZE/BANDAGES/DRESSINGS) ×2 IMPLANT
DRSG MEPITEL 4X7.2 (GAUZE/BANDAGES/DRESSINGS) ×2 IMPLANT
ELECTRODE REM PT RTRN 9FT ADLT (ELECTROSURGICAL) ×2 IMPLANT
GAUZE PAD ABD 8X10 STRL (GAUZE/BANDAGES/DRESSINGS) ×4 IMPLANT
GAUZE SPONGE 4X4 12PLY STRL (GAUZE/BANDAGES/DRESSINGS) ×2 IMPLANT
GLOVE BIO SURGEON STRL SZ8 (GLOVE) ×2 IMPLANT
GLOVE BIOGEL PI IND STRL 7.0 (GLOVE) IMPLANT
GLOVE BIOGEL PI IND STRL 8 (GLOVE) ×4 IMPLANT
GLOVE ECLIPSE 8.0 STRL XLNG CF (GLOVE) ×2 IMPLANT
GLOVE SURG SS PI 7.0 STRL IVOR (GLOVE) IMPLANT
GOWN STRL REUS W/ TWL LRG LVL3 (GOWN DISPOSABLE) ×2 IMPLANT
GOWN STRL REUS W/ TWL XL LVL3 (GOWN DISPOSABLE) ×2 IMPLANT
GOWN STRL REUS W/TWL XL LVL3 (GOWN DISPOSABLE) ×2 IMPLANT
IMPL TALAR ANKLE SZ 2 LT (Ankle) IMPLANT
INSERT TIB SZ 2-6 LT 8 (Orthopedic Implant) IMPLANT
NDL HYPO 22X1.5 SAFETY MO (MISCELLANEOUS) IMPLANT
NDL HYPO 25X1 1.5 SAFETY (NEEDLE) IMPLANT
NEEDLE HYPO 22X1.5 SAFETY MO (MISCELLANEOUS) IMPLANT
NEEDLE HYPO 25X1 1.5 SAFETY (NEEDLE) IMPLANT
NS IRRIG 1000ML POUR BTL (IV SOLUTION) ×2 IMPLANT
PACK BASIN DAY SURGERY FS (CUSTOM PROCEDURE TRAY) ×2 IMPLANT
PAD CAST 4YDX4 CTTN HI CHSV (CAST SUPPLIES) ×4 IMPLANT
PADDING CAST ABS COTTON 4X4 ST (CAST SUPPLIES) IMPLANT
PADDING CAST COTTON 6X4 STRL (CAST SUPPLIES) ×2 IMPLANT
PENCIL SMOKE EVACUATOR (MISCELLANEOUS) ×2 IMPLANT
PIN POUCH TALAR VANTAGE 3.5 (PIN) IMPLANT
PLATE TIB FB ANKLE SZ 2 LT (Ankle) IMPLANT
SANITIZER HAND PURELL FF 515ML (MISCELLANEOUS) ×2 IMPLANT
SAW STRYKER ANKLE 10X75X1.19 (BLADE) IMPLANT
SCOTCHCAST PLUS 3X4 WHITE (CAST SUPPLIES) IMPLANT
SCOTCHCAST PLUS 4X4 WHITE (CAST SUPPLIES) IMPLANT
SHEET MEDIUM DRAPE 40X70 STRL (DRAPES) ×2 IMPLANT
SLEEVE SCD COMPRESS KNEE MED (STOCKING) ×2 IMPLANT
SPIKE FLUID TRANSFER (MISCELLANEOUS) IMPLANT
SPONGE T-LAP 18X18 ~~LOC~~+RFID (SPONGE) ×2 IMPLANT
STOCKINETTE 6 STRL (DRAPES) ×2 IMPLANT
STRIP CLOSURE SKIN 1/2X4 (GAUZE/BANDAGES/DRESSINGS) ×2 IMPLANT
SUCTION TUBE FRAZIER 10FR DISP (SUCTIONS) IMPLANT
SURGILUBE 2OZ TUBE FLIPTOP (MISCELLANEOUS) ×2 IMPLANT
SUT ETHILON 3 0 PS 1 (SUTURE) IMPLANT
SUT MNCRL AB 3-0 PS2 18 (SUTURE) ×2 IMPLANT
SUT STRATA PDS 3-0 PS-1 (SUTURE) ×2 IMPLANT
SUT VIC AB 0 CT1 27XBRD ANBCTR (SUTURE) ×2 IMPLANT
SUT VIC AB 2-0 SH 27XBRD (SUTURE) IMPLANT
SUT VICRYL 0 SH 27 (SUTURE) IMPLANT
SUT VICRYL 0 UR6 27IN ABS (SUTURE) IMPLANT
SYR 20ML LL LF (SYRINGE) IMPLANT
SYR BULB EAR ULCER 3OZ GRN STR (SYRINGE) IMPLANT
SYR BULB IRRIG 60ML STRL (SYRINGE) ×2 IMPLANT
SYR CONTROL 10ML LL (SYRINGE) IMPLANT
TOWEL GREEN STERILE FF (TOWEL DISPOSABLE) ×4 IMPLANT
TUBE CONNECTING 20X1/4 (TUBING) ×2 IMPLANT
UNDERPAD 30X36 HEAVY ABSORB (UNDERPADS AND DIAPERS) ×2 IMPLANT
VANTAGE ACTIVIT-E TIBIAL INSERT, FIXED BEARING SIZ ×2 IMPLANT
YANKAUER SUCT BULB TIP NO VENT (SUCTIONS) ×2 IMPLANT

## 2024-03-24 NOTE — Anesthesia Procedure Notes (Signed)
 Anesthesia Regional Block: Adductor canal block   Pre-Anesthetic Checklist: , timeout performed,  Correct Patient, Correct Site, Correct Laterality,  Correct Procedure, Correct Position, site marked,  Risks and benefits discussed,  Surgical consent,  Pre-op evaluation,  At surgeon's request and post-op pain management  Laterality: Lower and Left  Prep: chloraprep       Needles:  Injection technique: Single-shot  Needle Type: Stimiplex     Needle Length: 9cm  Needle Gauge: 21     Additional Needles:   Procedures:,,,, ultrasound used (permanent image in chart),,    Narrative:  Start time: 03/24/2024 9:28 AM End time: 03/24/2024 9:43 AM Injection made incrementally with aspirations every 5 mL.  Performed by: Personally  Anesthesiologist: Gorman Laughter, MD  Additional Notes: BP cuff, EKG monitors applied. Sedation begun. Artery and nerve location verified with ultrasound. Anesthetic injected incrementally (5ml), slowly, and after negative aspirations under direct u/s guidance. Good fascial/perineural spread. Tolerated well.

## 2024-03-24 NOTE — Progress Notes (Signed)
Assisted Dr. Germeroth with right, adductor canal, ultrasound guided block. Side rails up, monitors on throughout procedure. See vital signs in flow sheet. Tolerated Procedure well. ? ?

## 2024-03-24 NOTE — Anesthesia Procedure Notes (Signed)
 Procedure Name: LMA Insertion Date/Time: 03/24/2024 10:05 AM  Performed by: Raymona Caldwell, CRNAPre-anesthesia Checklist: Patient identified, Emergency Drugs available, Suction available and Patient being monitored Patient Re-evaluated:Patient Re-evaluated prior to induction Oxygen Delivery Method: Circle system utilized Preoxygenation: Pre-oxygenation with 100% oxygen Induction Type: IV induction Ventilation: Mask ventilation without difficulty LMA: LMA inserted LMA Size: 4.0 Number of attempts: 1 Airway Equipment and Method: Bite block Placement Confirmation: positive ETCO2, breath sounds checked- equal and bilateral and CO2 detector Tube secured with: Tape Dental Injury: Teeth and Oropharynx as per pre-operative assessment

## 2024-03-24 NOTE — Anesthesia Procedure Notes (Signed)
 Anesthesia Regional Block: Popliteal block   Pre-Anesthetic Checklist: , timeout performed,  Correct Patient, Correct Site, Correct Laterality,  Correct Procedure, Correct Position, site marked,  Risks and benefits discussed,  Surgical consent,  Pre-op evaluation,  At surgeon's request and post-op pain management  Laterality: Lower and Left  Prep: chloraprep       Needles:  Injection technique: Single-shot  Needle Type: Stimiplex     Needle Length: 10cm  Needle Gauge: 21     Additional Needles:   Procedures:,,,, ultrasound used (permanent image in chart),,   Motor weakness within 5 minutes.  Narrative:  Start time: 03/24/2024 9:43 AM End time: 03/24/2024 9:48 AM Injection made incrementally with aspirations every 5 mL.  Performed by: Personally  Anesthesiologist: Gorman Laughter, MD  Additional Notes: Nerve located and needle positioned with direct ultrasound guidance. Good perineural spread. Patient tolerated well.

## 2024-03-24 NOTE — H&P (Signed)
 Mary [name] is an 63 y.o. female.   Chief Complaint: left ankle pain HPI: 63 y/o female with PMH of colon cancer c/o L ankle pain worsening over many years.  She has post traumatic arthritis and has failed non op treatment.  She presents now for total ankle replacement.  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 w/prolonged cold sensitivity--Oxaliplatin  related   Pneumothorax 07/19/2011   Bilateral   Pre-diabetes    Renal cysts, acquired, bilateral    Sleep apnea    does not use CPAP on regular basis    Past Surgical History:  Procedure Laterality Date   ABDOMINAL HYSTERECTOMY  mid 20s   bleeding   BREAST BIOPSY Right 08/2021   COLON SURGERY  05/17/2011   Partial colectomy   COLONOSCOPY  09/04/2014   One diminutive polyp at the recto sigmoid colon removed. Diverticulosis of the entire examined colon. Internal hemorrhoids.    HERNIA REPAIR  09/06/2012   lap incisional hernia   INSERTION OF MESH  09/06/2012   Procedure: INSERTION OF MESH;  Surgeon: Diantha Fossa, MD;  Location: [facility];  Service: General;  Laterality: N/A;   ORIF ANKLE FRACTURE Left 12/27/2020   Procedure: OPEN REDUCTION INTERNAL FIXATION (ORIF) Left ankle trimalleolar fracture dislocation;  Surgeon: Amada Backer, MD;  Location: [facility];  Service: Orthopedics;  Laterality: Left;    PORT-A-CATH REMOVAL  05/06/2012   Procedure: MINOR REMOVAL PORT-A-CATH;  Surgeon: Diantha Fossa, MD;  Location:  SURGERY CENTER;  Service:  General;  Laterality: Right;   PORTACATH PLACEMENT  07/20/2011   Right side placed   PORTACATH PLACEMENT     left side & right side   UPPER GI ENDOSCOPY N/A 09/22/2022   Procedure: UPPER GI ENDOSCOPY;  Surgeon: Jacolyn Matar, MD;  Location: [facility];  Service: General;  Laterality: N/A;   VENTRAL HERNIA REPAIR  09/06/2012   Procedure: LAPAROSCOPIC VENTRAL HERNIA;  Surgeon: Diantha Fossa, MD;  Location: [facility];  Service: General;  Laterality: N/A;    Family History  Problem Relation Age of Onset   Diabetes Mother    Hypertension Mother    Cancer Father        leukemia   Cancer Maternal Uncle        lung   Cancer Maternal Uncle        colon   Colitis Maternal Uncle    Cancer Maternal Uncle        prostate   Breast cancer Cousin 36       paternal first cousin   Cancer Brother        PROSTATE   Other Brother        MVA   Esophageal cancer Neg Hx    Rectal cancer Neg Hx    Stomach cancer Neg Hx    Social History:  reports that she has never smoked. She has never used smokeless tobacco. She reports that she does not drink  alcohol and does not use drugs.  Allergies:  Allergies  Allergen Reactions   Tylenol  [Acetaminophen ] Hives and Itching    No medications prior to admission.    Results for orders placed or performed during the hospital encounter of 04/03/24 (from the past 48 hours)  Surgical pcr screen     Status: None   Collection Time: 03/23/24  3:59 PM   Specimen: Nasal Mucosa; Nasal Swab  Result Value Ref Range   MRSA, PCR NEGATIVE NEGATIVE   Staphylococcus aureus NEGATIVE NEGATIVE    Comment: (NOTE) The Xpert SA Assay (FDA approved for NASAL specimens in patients 19 years of age and older), is one component of a comprehensive surveillance program. It is not intended to diagnose infection nor to guide or monitor treatment. Performed at [facility], [address] [place], [place] [address]    No results found.  Review of Systems  no recent  f/c/n/v/wt loss  Height 5\' 11"  (1.803 m), weight 90.7 kg. Physical Exam  Wn wd woman in nad.  A and O.  EOMI.  Resp unlabored.  L ankle TTP.  Small effusion.  5/5 strength in PF and DF of the ankle and toes.  Intact sens to LT att he dorsal and plantar foot.  No lymphadenopathy.  Brisk cap refill at the toes.   Assessment/Plan L ankle post traumatic arthritis - to the OR today for left total ankle replacement.  The risks and benefits of the alternative treatment options have been discussed in detail.  The patient wishes to proceed with surgery and specifically understands risks of bleeding, infection, nerve damage, blood clots, need for additional surgery, amputation and death.   Amada Backer, MD 04/03/2024, 7:14 AM

## 2024-03-24 NOTE — Discharge Instructions (Addendum)
 Mary Backer, MD [facility]  Please read the following information regarding your care after surgery.  Medications  You only need a prescription for the narcotic pain medicine (ex. oxycodone , Percocet, Norco).  All of the other medicines listed below are available over the counter. ? Aleve 2 pills twice a day for the first 3 days after surgery. ? acetominophen (Tylenol ) 650 mg every 4-6 hours as you need for minor to moderate pain (patient states allergic) ? oxycodone  as prescribed for severe pain  Narcotic pain medicine (ex. oxycodone , Percocet, Vicodin) will cause constipation.  To prevent this problem, take the following medicines while you are taking any pain medicine. ? docusate sodium  (Colace) 100 mg twice a day ? senna (Senokot) 2 tablets twice a day  ? To help prevent blood clots, take Xarelto  as prescribed for two weeks after surgery.  You should also get up every hour while you are awake to move around.    Weight Bearing ? Do not bear any weight on the operated leg or foot.  Cast / Splint / Dressing ? Keep your splint, cast or dressing clean and dry.  Don't put anything (coat hanger, pencil, etc) down inside of it.  If it gets damp, use a hair dryer on the cool setting to dry it.  If it gets soaked, call the office to schedule an appointment for a cast change.   After your dressing, cast or splint is removed; you may shower, but do not soak or scrub the wound.  Allow the water  to run over it, and then gently pat it dry.  Swelling It is normal for you to have swelling where you had surgery.  To reduce swelling and pain, keep your toes above your nose for at least 3 days after surgery.  It may be necessary to keep your foot or leg elevated for several weeks.  If it hurts, it should be elevated.  Follow Up Call my office at [phone] when you are discharged from the hospital or surgery center to schedule an appointment to be seen 3 weeks after surgery.  Call my office at  [phone] if you develop a fever >101.5 F, nausea, vomiting, bleeding from the surgical site or severe pain.     Information for Discharge Teaching: EXPAREL  (bupivacaine  liposome injectable suspension)   Pain relief is important to your recovery. The goal is to control your pain so you can move easier and return to your normal activities as soon as possible after your procedure. Your physician may use several types of medicines to manage pain, swelling, and more.  Your surgeon or anesthesiologist gave you EXPAREL (bupivacaine ) to help control your pain after surgery.  EXPAREL  is a local anesthetic designed to release slowly over an extended period of time to provide pain relief by numbing the tissue around the surgical site. EXPAREL  is designed to release pain medication over time and can control pain for up to 72 hours. Depending on how you respond to EXPAREL , you may require less pain medication during your recovery. EXPAREL  can help reduce or eliminate the need for opioids during the first few days after surgery when pain relief is needed the most. EXPAREL  is not an opioid and is not addictive. It does not cause sleepiness or sedation.   Important! A teal colored band has been placed on your arm with the date, time and amount of EXPAREL  you have received. Please leave this armband in place for the full 96 hours following administration, and then you may  remove the band. If you return to the hospital for any reason within 96 hours following the administration of EXPAREL , the armband provides important information that your health care providers to know, and alerts them that you have received this anesthetic.    Possible side effects of EXPAREL : Temporary loss of sensation or ability to move in the area where medication was injected. Nausea, vomiting, constipation Rarely, numbness and tingling in your mouth or lips, lightheadedness, or anxiety may occur. Call your doctor right away if you  think you may be experiencing any of these sensations, or if you have other questions regarding possible side effects.  Follow all other discharge instructions given to you by your surgeon or nurse. Eat a healthy diet and drink plenty of water  or other fluids.   Regional Anesthesia Blocks  1. You may not be able to move or feel the "blocked" extremity after a regional anesthetic block. This may last may last from 3-48 hours after placement, but it will go away. The length of time depends on the medication injected and your individual response to the medication. As the nerves start to wake up, you may experience tingling as the movement and feeling returns to your extremity. If the numbness and inability to move your extremity has not gone away after 48 hours, please call your surgeon.   2. The extremity that is blocked will need to be protected until the numbness is gone and the strength has returned. Because you cannot feel it, you will need to take extra care to avoid injury. Because it may be weak, you may have difficulty moving it or using it. You may not know what position it is in without looking at it while the block is in effect.  3. For blocks in the legs and feet, returning to weight bearing and walking needs to be done carefully. You will need to wait until the numbness is entirely gone and the strength has returned. You should be able to move your leg and foot normally before you try and bear weight or walk. You will need someone to be with you when you first try to ensure you do not fall and possibly risk injury.  4. Bruising and tenderness at the needle site are common side effects and will resolve in a few days.  5. Persistent numbness or new problems with movement should be communicated to the surgeon or the [facility] [phone] [facility] [phone]).  Post Anesthesia Home Care Instructions  Activity: Get plenty of rest for the remainder of the  day. A responsible individual must stay with you for 24 hours following the procedure.  For the next 24 hours, DO NOT: -Drive a car -Advertising copywriter -Drink alcoholic beverages -Take any medication unless instructed by your physician -Make any legal decisions or sign important papers.  Meals: Start with liquid foods such as gelatin or soup. Progress to regular foods as tolerated. Avoid greasy, spicy, heavy foods. If nausea and/or vomiting occur, drink only clear liquids until the nausea and/or vomiting subsides. Call your physician if vomiting continues.  Special Instructions/Symptoms: Your throat may feel dry or sore from the anesthesia or the breathing tube placed in your throat during surgery. If this causes discomfort, gargle with warm salt water . The discomfort should disappear within 24 hours.  If you had a scopolamine  patch placed behind your ear for the management of post- operative nausea and/or vomiting:  1. The medication in the patch is effective for  72 hours, after which it should be removed.  Wrap patch in a tissue and discard in the trash. Wash hands thoroughly with soap and water . 2. You may remove the patch earlier than 72 hours if you experience unpleasant side effects which may include dry mouth, dizziness or visual disturbances. 3. Avoid touching the patch. Wash your hands with soap and water  after contact with the patch.

## 2024-03-24 NOTE — Op Note (Signed)
 03/24/2024  12:33 PM  PATIENT:  Mary [name]  63 y.o. female  PRE-OPERATIVE DIAGNOSIS:  1.  Left Ankle post traumatic arthritis 2.  Acquired short left Achilles tendon  POST-OPERATIVE DIAGNOSIS:  same  Procedure(s):  Left achilles tendon percutaneous lengthening 2.  Left total ankle replacement  SURGEON:  Amada Backer, MD  ASSISTANT: Adelfa Adolph, PA-C  ANESTHESIA:   General, regional  EBL:  minimal   TOURNIQUET:   Total Tourniquet Time Documented: Thigh (Left) - 123 minutes Total: Thigh (Left) - 123 minutes  COMPLICATIONS:  None apparent  DISPOSITION:  Extubated, awake and stable to recovery.  INDICATION FOR PROCEDURE: 63 year old female without significant past medical history complains of worsening left ankle pain due to posttraumatic arthritis.  She has failed nonoperative treatment including activity modification, oral anti-inflammatories, bracing, physical therapy and intra-articular steroid injections.  She presents now for left total ankle replacement and possible heel cord lengthening.  The risks and benefits of the alternative treatment options have been discussed in detail.  The patient wishes to proceed with surgery and specifically understands risks of bleeding, infection, nerve damage, blood clots, need for additional surgery, amputation and death.   PROCEDURE IN DETAIL:  After pre operative consent was obtained, and the correct operative site was identified, the patient was brought to the operating room and placed supine on the OR table.  Anesthesia was administered.  Pre-operative antibiotics were administered.  A surgical timeout was taken.  The left lower extremity was prepped and draped in standard sterile fashion with a tourniquet around the thigh.  The extremity was elevated, and the tourniquet was inflated to 250 mmHg.  An incision was made longitudinally over the anterior ankle.  Dissection was carried sharply down through the subcutaneous tissues.  Care was  taken to protect branches of the superficial peroneal nerve distally.  The EHL tendon was identified.  The retinaculum was incised over the EHL and released proximally and distally.  There was extensive scarring at the interval between the tibialis anterior and EHL.  Careful dissection was carried down through the scar tissue.  The periosteum was elevated medially and laterally.  The joint capsule was then incised and elevated medially and laterally.  The anterior osteophytes were removed.  The tibial alignment guide was then positioned over the anterior ankle in line with the medial gutter.  It was attached proximally at the 3.2 mm pin at the tibial tubercle.  Distally it was provisionally pinned over the crest.  Rotation was then set in line with the medial gutter and locked in position.  A lateral view was then obtained.  The slope was set and then the resection level was set 7 mm proximal from the tibial plafond.  The cutting block was then pinned.  An AP view was obtained confirming appropriate varus valgus alignment.  The cutting block was translated slightly medially in line with the medial gutter.  The cutting block was then pinned distally.  The oscillating saw was used to make the primary cut.  The reciprocating saw was then used to make the vertical cut at the medial gutter.  The cut bone was then removed.  The guide was then placed on the talus and pinned under fluoroscopic guidance.  The cartilage had previously been removed prior to positioning the guide.  The guide was pinned in position.  The oscillating saw was used to cut the dome of the talus.  The cut bone was removed along with the cutting guide.  A size  2 spacer fit well within the ankle mortise.  The talar sizer confirmed a size 2 and was pinned under fluoroscopic guidance.  The sizer was then removed and replaced by the cutting block.  The block was pinned in position.  The posterior chamfer cut was made followed by the anterior middle cuts.   The guidepins centrally were removed and the more anterior chamfer cut was made.  All cut bone was removed.  The cut edges were smoothed with a curved rasp.  The wound was then irrigated copiously.  A size 2 talar trial was implanted and centered appropriately.  A central screw was positioned holding it in place.  The tibial trial was then inserted and was noted to fit appropriately.  A poly trial was inserted.  AP and lateral radiographs confirmed appropriate alignment of the implants and appropriate bone cuts.  The trial talus was removed.  The tibial implant was punched centrally and peripherally.  The wound was then irrigated copiously with Surgiphor.  The tibial implant was then inserted and impacted into position after sprinkling with vancomycin  powder.  Appropriate position of the implant was confirmed with AP and lateral radiographs.  The talar implant was then impacted into position taking care to protect the tibial implant.  A trial poly spacer was inserted.  The ankle was noted to be quite tight.  A percutaneous Achilles tendon lengthening was performed.  This allowed appropriate dorsiflexion of the ankle.  The deltoid ligament was released medially.  The ATFL was released distally.  The trial spacer was removed.  A 6 mm polyethylene vitamin E spacer was inserted and fit appropriately.  The locking clip was then inserted and locked appropriately.  Final AP, mortise and lateral radiographs confirmed appropriate position of all hardware.  The wound was then irrigated copiously with Surgiphor irrigant.  The anterior joint capsule was repaired with 0 Vicryl.  The extensor retinaculum was repaired with 0 Vicryl.  Subcutaneous tissues were approximated with 3-0 Monocryl.  A running 3-0 subcuticular STRATAFIX suture was used to close the skin incision.  Steri-Strips were applied.  Sterile dressings were applied followed by a well-padded short leg cast.  The tourniquet was released after application of the  dressings.  The patient was awakened from anesthesia and transported to the recovery room in stable condition.  FOLLOW UP PLAN: Nonweightbearing on the left lower extremity.  Follow-up in the office in 3 weeks for suture removal and conversion to a cam boot.  Xarelto  for DVT prophylaxis for 2 weeks followed by baby aspirin  daily for the following month.   RADIOGRAPHS: AP mortise and lateral radiographs of the left ankle are obtained intraoperatively.  These show interval total ankle arthroplasty.  Implants are appropriately aligned and sized.  No other acute injuries are noted.    Justin Ollis PA-C was present and scrubbed for the duration of the operative case. His assistance was essential in positioning the patient, prepping and draping, gaining and maintaining exposure, performing the operation, closing and dressing the wounds and applying the splint.

## 2024-03-25 ENCOUNTER — Encounter (HOSPITAL_BASED_OUTPATIENT_CLINIC_OR_DEPARTMENT_OTHER): Payer: Self-pay | Admitting: Orthopedic Surgery

## 2024-03-25 NOTE — Transfer of Care (Signed)
 Immediate Anesthesia Transfer of Care Note  Patient: Mary [name]  Procedure(s) Performed: ARTHROPLASTY, ANKLE, TOTAL (Left: Ankle) LENGTHENING, TENDON, ACHILLES (Left: Foot)  Patient Location: PACU  Anesthesia Type:General  Level of Consciousness: awake, alert , and patient cooperative  Airway & Oxygen Therapy: Patient Spontanous Breathing and Patient connected to face mask oxygen  Post-op Assessment: Report given to RN and Post -op Vital signs reviewed and stable  Post vital signs: Reviewed and stable  Last Vitals:  Vitals Value Taken Time  BP 130/70 03/24/24 1332  Temp 36.2 C 03/24/24 1332  Pulse 68 03/24/24 1332  Resp 16 03/24/24 1332  SpO2 99 % 03/24/24 1332    Last Pain:  Vitals:   03/24/24 1300  TempSrc:   PainSc: 0-No pain      Patients Stated Pain Goal: 3 (03/24/24 0813)  Complications: No notable events documented.

## 2024-03-25 NOTE — Anesthesia Postprocedure Evaluation (Signed)
 Anesthesia Post Note  Patient: Mary [name]  Procedure(s) Performed: ARTHROPLASTY, ANKLE, TOTAL (Left: Ankle) LENGTHENING, TENDON, ACHILLES (Left: Foot)     Patient location during evaluation: PACU Anesthesia Type: General Level of consciousness: patient cooperative and awake and alert Pain management: pain level controlled Vital Signs Assessment: post-procedure vital signs reviewed and stable Respiratory status: spontaneous breathing Cardiovascular status: stable Anesthetic complications: no   No notable events documented.  Last Vitals:  Vitals:   03/24/24 1315 03/24/24 1332  BP: 126/76 130/70  Pulse: 67 68  Resp: 14 16  Temp:  (!) 36.2 C  SpO2: 96% 99%    Last Pain:  Vitals:   03/24/24 1300  TempSrc:   PainSc: 0-No pain                 Gorman Laughter

## 2024-03-30 ENCOUNTER — Encounter (HOSPITAL_BASED_OUTPATIENT_CLINIC_OR_DEPARTMENT_OTHER): Payer: Self-pay | Admitting: Orthopedic Surgery

## 2024-03-30 ENCOUNTER — Other Ambulatory Visit: Payer: Self-pay | Admitting: Family

## 2024-03-31 ENCOUNTER — Ambulatory Visit

## 2024-04-07 ENCOUNTER — Other Ambulatory Visit (HOSPITAL_COMMUNITY): Payer: Self-pay

## 2024-04-07 ENCOUNTER — Other Ambulatory Visit: Payer: Self-pay | Admitting: Family

## 2024-04-07 ENCOUNTER — Telehealth: Payer: Self-pay

## 2024-04-07 NOTE — Telephone Encounter (Signed)
 Copied from CRM [id number]. Topic: Clinical - Prescription Issue >> Apr 07, 2024  4:55 PM Taleah C wrote: Reason for CRM: pt called in and stated that her pharmacy needs a prior auth for Mounjaro.

## 2024-04-08 ENCOUNTER — Other Ambulatory Visit: Payer: Self-pay | Admitting: Family

## 2024-04-08 ENCOUNTER — Ambulatory Visit (INDEPENDENT_AMBULATORY_CARE_PROVIDER_SITE_OTHER): Admitting: *Deleted

## 2024-04-08 ENCOUNTER — Ambulatory Visit

## 2024-04-08 DIAGNOSIS — R7989 Other specified abnormal findings of blood chemistry: Secondary | ICD-10-CM | POA: Diagnosis not present

## 2024-04-08 MED ORDER — CYANOCOBALAMIN 1000 MCG/ML IJ SOLN
1000.0000 ug | Freq: Once | INTRAMUSCULAR | Status: AC
Start: 2024-04-08 — End: 2024-04-08
  Administered 2024-04-08: 1000 ug via INTRAMUSCULAR

## 2024-04-08 NOTE — Telephone Encounter (Signed)
 Copied from CRM [id number]. Topic: Clinical - Prescription Issue >> Apr 08, 2024  5:26 PM Lajean Pike wrote: Reason for CRM: Patient called in and stated that pharmacy received the wrong amount for tirzepatide (MOUNJARO) 5 MG/0.5ML Pen [[id number]].  Patient stated the script needs to be for three months so she can be charged less.   [id number], [place] - [address] [facility] [facility] [place] [address] Phone: [phone] Fax: [phone]

## 2024-04-08 NOTE — Progress Notes (Signed)
 Patient here for 2-3 months b12 injection per physicians order.  Injection given in left deltoid and patient tolerated well.  Per Rice Chamorro patient is to come back 2-3 months for maintenance b12 injections.  Patient scheduled for 07/08/24

## 2024-04-11 ENCOUNTER — Other Ambulatory Visit (HOSPITAL_COMMUNITY): Payer: Self-pay

## 2024-04-11 ENCOUNTER — Telehealth: Payer: Self-pay

## 2024-04-11 NOTE — Telephone Encounter (Signed)
 Pharmacy Patient Advocate Encounter   Received notification from Pt Calls Messages that prior authorization for Mounjaro  5mg /0.55ml is required/requested.   Insurance verification completed.   The patient is insured through [employer] .   Per test claim: PA required; PA submitted to above mentioned insurance via CoverMyMeds Key/confirmation #/EOC [id number] Status is pending

## 2024-04-13 ENCOUNTER — Encounter (HOSPITAL_BASED_OUTPATIENT_CLINIC_OR_DEPARTMENT_OTHER): Payer: Self-pay | Admitting: Orthopedic Surgery

## 2024-04-13 ENCOUNTER — Other Ambulatory Visit (HOSPITAL_BASED_OUTPATIENT_CLINIC_OR_DEPARTMENT_OTHER): Payer: Self-pay

## 2024-04-13 NOTE — Telephone Encounter (Signed)
 Pharmacy Patient Advocate Encounter  Received notification from [facility] that Prior Authorization for Mounjaro  5mg /0.92ml has been CANCELLED due to an approval on file until 09/28/2024.  Case Id: [id number]  Last filled 04/11/24 for 6ml and next refill is 06/14/24.

## 2024-04-23 ENCOUNTER — Other Ambulatory Visit: Payer: Self-pay | Admitting: Family

## 2024-04-27 ENCOUNTER — Other Ambulatory Visit: Payer: Self-pay | Admitting: Family

## 2024-04-27 ENCOUNTER — Encounter (HOSPITAL_COMMUNITY): Payer: Self-pay | Admitting: *Deleted

## 2024-04-27 ENCOUNTER — Telehealth: Payer: Self-pay | Admitting: Family

## 2024-04-27 DIAGNOSIS — E559 Vitamin D deficiency, unspecified: Secondary | ICD-10-CM

## 2024-04-27 DIAGNOSIS — E538 Deficiency of other specified B group vitamins: Secondary | ICD-10-CM

## 2024-04-27 NOTE — Telephone Encounter (Signed)
 Chart reviewed- just Vitamin D  recheck correct?

## 2024-04-27 NOTE — Telephone Encounter (Signed)
 Good morning this pt needs lab for this Friday

## 2024-04-29 ENCOUNTER — Other Ambulatory Visit (INDEPENDENT_AMBULATORY_CARE_PROVIDER_SITE_OTHER)

## 2024-04-29 DIAGNOSIS — E559 Vitamin D deficiency, unspecified: Secondary | ICD-10-CM

## 2024-04-29 DIAGNOSIS — E538 Deficiency of other specified B group vitamins: Secondary | ICD-10-CM

## 2024-04-29 NOTE — Addendum Note (Signed)
 Addended by: Marigene Shoulder on: 04/29/2024 01:24 PM   Modules accepted: Orders

## 2024-04-30 LAB — VITAMIN D 25 HYDROXY (VIT D DEFICIENCY, FRACTURES): Vit D, 25-Hydroxy: 67 ng/mL (ref 30–100)

## 2024-04-30 LAB — VITAMIN B12: Vitamin B-12: 504 pg/mL (ref 200–1100)

## 2024-05-02 ENCOUNTER — Telehealth: Payer: Self-pay

## 2024-05-02 ENCOUNTER — Telehealth: Payer: Self-pay | Admitting: Family

## 2024-05-02 ENCOUNTER — Ambulatory Visit: Payer: Self-pay | Admitting: Family

## 2024-05-02 NOTE — Telephone Encounter (Unsigned)
 Copied from CRM [id number]. Topic: Clinical - Medication Refill >> May 02, 2024  3:58 PM Franky GRADE wrote: Medication: Vitamin D , Ergocalciferol , (DRISDOL ) 1.25 MG (50000 UNIT) CAPS capsule [[id number]] (3 month supply)  Has the patient contacted their pharmacy? No (Agent: If no, request that the patient contact the pharmacy for the refill. If patient does not wish to contact the pharmacy document the reason why and proceed with request.) (Agent: If yes, when and what did the pharmacy advise?)  This is the patient's preferred pharmacy:  [facility] [address] ([address] [place] - [address] [place] [address] Phone: [phone] Fax: [phone]   Is this the correct pharmacy for this prescription? Yes If no, delete pharmacy and type the correct one.   Has the prescription been filled recently? No  Is the patient out of the medication? Yes  Has the patient been seen for an appointment in the last year OR does the patient have an upcoming appointment? Yes  Can we respond through MyChart? Yes  Agent: Please be advised that Rx refills may take up to 3 business days. We ask that you follow-up with your pharmacy.

## 2024-05-02 NOTE — Telephone Encounter (Signed)
 Copied from CRM [id number]. Topic: Clinical - Medication Question >> May 02, 2024  4:00 PM Franky GRADE wrote: Reason for CRM: Patient would like to increase the dose on her tirzepatide  (MOUNJARO ) 5 MG/0.5ML Pen [[id number]] and see if Dr.Murray can send a 3 month supply.

## 2024-05-02 NOTE — Telephone Encounter (Signed)
 Requested medication not on profile

## 2024-05-03 ENCOUNTER — Other Ambulatory Visit: Payer: Self-pay | Admitting: Family

## 2024-05-03 MED ORDER — TIRZEPATIDE 7.5 MG/0.5ML ~~LOC~~ SOAJ: 7.5000 mg | SUBCUTANEOUS | 0 refills | Status: DC

## 2024-05-03 NOTE — Telephone Encounter (Signed)
Rx sent by provider

## 2024-05-09 NOTE — Telephone Encounter (Signed)
 Mychart message has been sent to pt.

## 2024-05-13 ENCOUNTER — Other Ambulatory Visit: Payer: Self-pay | Admitting: Family

## 2024-05-16 ENCOUNTER — Other Ambulatory Visit: Payer: Self-pay | Admitting: Family

## 2024-05-17 ENCOUNTER — Telehealth: Payer: Self-pay

## 2024-05-17 NOTE — Telephone Encounter (Signed)
 Copied from CRM [id number]. Topic: Clinical - Medication Question >> May 17, 2024  9:39 AM Berneda FALCON wrote: Reason for CRM: Pt called in stating she did speak to the [facility] nurse and that this medication (Vitamin D ) was not prescribed by them, but that Dr. Jason prescribed this for her on 02/05/2024 and she is completely out. Wants to speak to someone about this to get it refilled or to know if she should continue to take this medication or not.  Please call patient back at [phone]

## 2024-05-17 NOTE — Telephone Encounter (Signed)
 Communication  Reason for CRM: Patient is calling about her vitamin D  pills and stated she will check with her kidney doctor, but she is sure they did not prescribe the medication as it has Dr Elna name listed on her bottle   Please advise.

## 2024-05-18 NOTE — Telephone Encounter (Signed)
 My chart message sent to pt.

## 2024-05-26 ENCOUNTER — Other Ambulatory Visit: Payer: Self-pay | Admitting: Family

## 2024-06-19 ENCOUNTER — Other Ambulatory Visit: Payer: Self-pay | Admitting: Family

## 2024-06-26 ENCOUNTER — Other Ambulatory Visit: Payer: Self-pay | Admitting: Family

## 2024-07-01 ENCOUNTER — Telehealth: Payer: Self-pay

## 2024-07-01 NOTE — Telephone Encounter (Signed)
 Physician orders received and fax back successfully

## 2024-07-08 ENCOUNTER — Ambulatory Visit

## 2024-07-14 ENCOUNTER — Other Ambulatory Visit: Payer: Self-pay | Admitting: Family

## 2024-07-18 ENCOUNTER — Other Ambulatory Visit (HOSPITAL_COMMUNITY): Payer: Self-pay

## 2024-07-18 ENCOUNTER — Telehealth: Payer: Self-pay

## 2024-07-18 NOTE — Telephone Encounter (Signed)
 Copied from CRM [id number]. Topic: Clinical - Prescription Issue >> Jul 18, 2024  4:44 PM Delon T wrote: Reason for CRM: tirzepatide  (MOUNJARO ) 7.5 MG/0.5ML Pen- recevied text that the medication will be $871.53, supposed to have a coupon

## 2024-07-18 NOTE — Telephone Encounter (Signed)
 Can you see if PA is needed.

## 2024-07-19 ENCOUNTER — Telehealth: Payer: Self-pay

## 2024-07-19 ENCOUNTER — Other Ambulatory Visit (HOSPITAL_COMMUNITY): Payer: Self-pay

## 2024-07-19 ENCOUNTER — Ambulatory Visit

## 2024-07-19 NOTE — Telephone Encounter (Signed)
 Pt called and stated that she has reached her limit the mounjaro  from [facility]. She is requesting [facility] [address], [place], [place] [address] for a 3 mo supply and so she can use her coupon.

## 2024-07-19 NOTE — Telephone Encounter (Signed)
 Noted

## 2024-07-19 NOTE — Telephone Encounter (Signed)
 Copied from CRM [id number]. Topic: Clinical - Prescription Issue >> Jul 18, 2024  4:54 PM Roselie BROCKS wrote: Reason for CRM: Patient states that she has a standing coupon for  tirzepatide  (MOUNJARO ) 7.5 MG/0.5ML Pen for $25.00 and the Pharmacy said its over $800 . She said she needs the provider to reach out to pharmacy concerning this coupon.

## 2024-07-19 NOTE — Telephone Encounter (Signed)
 Copied from CRM [id number]. Topic: General - Other >> Jul 18, 2024  5:00 PM Debby BROCKS wrote: Reason for CRM: Patient was in a call with Canter Kaylyn CMA but the call cut off. Could not reach CAL due to the the practice closing. Patent would like a call back on 07/19/2024 >> Jul 19, 2024  8:01 AM CMA Kaylyn C wrote: Kurt where they got my name, I've never spoke w/ Pt.

## 2024-07-20 ENCOUNTER — Ambulatory Visit (INDEPENDENT_AMBULATORY_CARE_PROVIDER_SITE_OTHER)

## 2024-07-20 DIAGNOSIS — E538 Deficiency of other specified B group vitamins: Secondary | ICD-10-CM | POA: Diagnosis not present

## 2024-07-20 MED ORDER — CYANOCOBALAMIN 1000 MCG/ML IJ SOLN
1000.0000 ug | Freq: Once | INTRAMUSCULAR | Status: AC
  Administered 2024-07-20: 1000 ug via INTRAMUSCULAR

## 2024-07-20 NOTE — Telephone Encounter (Signed)
 Pt has scheduled OV 07/22/2024.

## 2024-07-20 NOTE — Progress Notes (Signed)
 Pt here for monthly B12 injection per original order dated:   Last B12 injection: 04/08/2024  B12 1000mcg given IM L deltoid, and pt tolerated injection well.  Next B12 injection scheduled for: 09/21/2024

## 2024-07-21 ENCOUNTER — Telehealth: Payer: Self-pay

## 2024-07-21 MED ORDER — MOUNJARO 7.5 MG/0.5ML ~~LOC~~ SOAJ: 7.5000 mg | SUBCUTANEOUS | 0 refills | Status: DC

## 2024-07-21 NOTE — Telephone Encounter (Signed)
 [facility] [address]

## 2024-07-21 NOTE — Telephone Encounter (Signed)
 Copied from CRM #[id number]. Topic: Clinical - Medication Question >> Jul 21, 2024 10:54 AM Martinique E wrote: Reason for CRM: Patient called in stating that she needs her tirzepatide  (MOUNJARO ) 7.5 MG/0.5ML Pen [facility] [address] [facility], due to insurance reasons. Callback number for patient is [phone].

## 2024-07-22 ENCOUNTER — Ambulatory Visit: Admitting: Family

## 2024-07-22 ENCOUNTER — Other Ambulatory Visit: Payer: Self-pay | Admitting: Family

## 2024-07-22 VITALS — BP 122/86 | HR 85 | Temp 98.4°F | Resp 12 | Ht 71.0 in | Wt 201.4 lb

## 2024-07-22 DIAGNOSIS — E119 Type 2 diabetes mellitus without complications: Secondary | ICD-10-CM

## 2024-07-22 DIAGNOSIS — Z7985 Long-term (current) use of injectable non-insulin antidiabetic drugs: Secondary | ICD-10-CM

## 2024-07-22 MED ORDER — TIRZEPATIDE 10 MG/0.5ML ~~LOC~~ SOAJ: 10.0000 mg | SUBCUTANEOUS | 0 refills | Status: DC

## 2024-07-22 NOTE — Progress Notes (Signed)
 Mary [name] is a 63 y.o. female with the following history as recorded in EpicCare:  Patient Active Problem List   Diagnosis Date Noted   Status post sleeve gastrectomy 09/22/2022   Left trimalleolar fracture, closed, initial encounter 12/18/2020   Left ankle pain 12/18/2020   Labial cyst 03/05/2018   Chest pain 05/30/2017   Chronic nonintractable headache 05/04/2017   Ankle edema, bilateral 05/04/2017   Cellulitis 04/04/2016   Abnormal x-ray 04/04/2016   Hearing loss due to cerumen impaction 01/14/2016   Impaired fasting blood sugar 01/14/2016   Plantar fasciitis, right 11/22/2015   Right calcaneal fracture 08/15/2015   OSA (obstructive sleep apnea) 09/26/2014   Peripheral neuropathy due to chemotherapy (HCC) 06/05/2014   Obesity 06/05/2014   history of DVT (deep venous thrombosis) 02/20/2014   GERD (gastroesophageal reflux disease) 02/20/2014   Hernia 09/22/2012   Iron deficiency anemia, unspecified 08/30/2011   Colon cancer (HCC) 08/29/2011   Cancer of descending colon (HCC) 05/12/2011    Current Outpatient Medications  Medication Sig Dispense Refill   ACCU-CHEK GUIDE test strip USE   TO CHECK GLUCOSE UP TO 4 TIMES DAILY 100 each 0   Accu-Chek Softclix Lancets lancets USE   TO CHECK GLUCOSE UP TO 4 TIMES DAILY 100 each 0   amLODipine  (NORVASC ) 5 MG tablet Take 1 tablet by mouth once daily 90 tablet 0   blood glucose meter kit and supplies KIT Dispense based on patient and insurance preference. Use up to four times daily as directed. 1 each 0   docusate sodium  (COLACE) 100 MG capsule Take 1 capsule (100 mg total) by mouth 2 (two) times daily. While taking narcotic pain medicine. 30 capsule 0   famotidine  (PEPCID ) 20 MG tablet Take 10 mg by mouth 2 (two) times daily.     gabapentin  (NEURONTIN ) 300 MG capsule TAKE 1 CAPSULE BY MOUTH THREE TIMES DAILY 90 capsule 0   IRON, FERROUS SULFATE, PO Take 1 tablet by mouth daily.     Oxycodone  HCl 10 MG TABS Take 10 mg by mouth 4  (four) times daily as needed (pain.).     pantoprazole  (PROTONIX ) 40 MG tablet Take 40 mg by mouth 2 (two) times daily.     rivaroxaban  (XARELTO ) 10 MG TABS tablet Take 1 tablet (10 mg total) by mouth daily. 14 tablet 0   senna (SENOKOT) 8.6 MG TABS tablet Take 2 tablets (17.2 mg total) by mouth 2 (two) times daily. 30 tablet 0   tirzepatide  (MOUNJARO ) 10 MG/0.5ML Pen Inject 10 mg into the skin once a week. 6 mL 0   Current Facility-Administered Medications  Medication Dose Route Frequency Provider Last Rate Last Admin   cyanocobalamin  ((VITAMIN B-12)) injection 1,000 mcg  1,000 mcg Intramuscular Once Jason Leita Repine, FNP        Allergies: Tylenol  [acetaminophen ]  Past Medical History:  Diagnosis Date   Adenocarcinoma sigmoid colon 05/12/2011   Stage III(pT2pN2b) descending sigmoid   Arm DVT (deep venous thromboembolism), acute (HCC) 07/04/2011   r/t PAC   Blood transfusion    Cancer of descending colon (HCC) 05/12/2011   Diverticulitis    GERD (gastroesophageal reflux disease)    History of migraine headaches    Hypertension    Incisional hernia    iron deficiency anemia    Multiple hemangiomas    Liver   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 w/prolonged cold sensitivity--Oxaliplatin  related   Pneumothorax 07/19/2011   Bilateral   Pre-diabetes  Renal cysts, acquired, bilateral    Sleep apnea    does not use CPAP on regular basis    Past Surgical History:  Procedure Laterality Date   ABDOMINAL HYSTERECTOMY  mid 20s   bleeding   ACHILLES TENDON LENGTHENING Left 03/24/2024   Procedure: LENGTHENING, TENDON, ACHILLES;  Surgeon: Kit Rush, MD;  Location: [facility];  Service: Orthopedics;  Laterality: Left;   BREAST BIOPSY Right 08/2021   COLON SURGERY  05/17/2011   Partial colectomy   COLONOSCOPY  09/04/2014   One diminutive polyp at the recto sigmoid colon removed. Diverticulosis of the entire examined colon. Internal hemorrhoids.     HERNIA REPAIR  09/06/2012   lap incisional hernia   INSERTION OF MESH  09/06/2012   Procedure: INSERTION OF MESH;  Surgeon: Lynwood MALVA Pina, MD;  Location: [facility];  Service: General;  Laterality: N/A;   ORIF ANKLE FRACTURE Left 12/27/2020   Procedure: OPEN REDUCTION INTERNAL FIXATION (ORIF) Left ankle trimalleolar fracture dislocation;  Surgeon: Kit Rush, MD;  Location: [facility];  Service: Orthopedics;  Laterality: Left;    PORT-A-CATH REMOVAL  05/06/2012   Procedure: MINOR REMOVAL PORT-A-CATH;  Surgeon: Lynwood MALVA Pina, MD;  Location: [facility];  Service: General;  Laterality: Right;   PORTACATH PLACEMENT  07/20/2011   Right side placed   PORTACATH PLACEMENT     left side & right side   TOTAL ANKLE ARTHROPLASTY Left 03/24/2024   Procedure: ARTHROPLASTY, ANKLE, TOTAL;  Surgeon: Kit Rush, MD;  Location: [facility];  Service: Orthopedics;  Laterality: Left;   UPPER GI ENDOSCOPY N/A 09/22/2022   Procedure: UPPER GI ENDOSCOPY;  Surgeon: Gladis Cough, MD;  Location: [facility];  Service: General;  Laterality: N/A;   VENTRAL HERNIA REPAIR  09/06/2012   Procedure: LAPAROSCOPIC VENTRAL HERNIA;  Surgeon: Lynwood MALVA Pina, MD;  Location: [facility];  Service: General;  Laterality: N/A;    Family History  Problem Relation Age of Onset   Diabetes Mother    Hypertension Mother    Cancer Father        leukemia   Cancer Maternal Uncle        lung   Cancer Maternal Uncle        colon   Colitis Maternal Uncle    Cancer Maternal Uncle        prostate   Breast cancer Cousin 2       paternal first cousin   Cancer Brother        PROSTATE   Other Brother        MVA   Esophageal cancer Neg Hx    Rectal cancer Neg Hx    Stomach cancer Neg Hx     Social History   Tobacco Use   Smoking status: Never   Smokeless tobacco: Never  Substance Use Topics   Alcohol use: No    Subjective:   6 month follow up on Type 2 Diabetes- has done well on  Mounjaro  and would like to increase dosage; needs 3 month prescription written per her insurance; has continued to work with nephrology;   Objective:  Vitals:   07/22/24 1516  BP: 122/86  Pulse: 85  Resp: 12  Temp: 98.4 F (36.9 C)  TempSrc: Oral  SpO2: 98%  Weight: 201 lb 6.4 oz (91.4 kg)  Height: 5' 11 (1.803 m)    General: Well developed, well nourished, in no acute distress  Skin : Warm and dry.  Head: Normocephalic and atraumatic  Lungs: Respirations unlabored; clear to auscultation bilaterally without wheeze, rales, rhonchi  CVS exam: normal rate and regular rhythm.  Vessels: Symmetric bilaterally  Neurologic: Alert and oriented; speech intact; face symmetrical; moves all extremities well; CNII-XII intact without focal deficit   Assessment:  1. Type 2 diabetes mellitus without complication, without long-term current use of insulin  [facility])     Plan:  Patient will plan to go to [facility] lab next week to get her labs updated; Rx for Mounjaro  10 mg- use as directed.    Return in about 3 months (around 10/21/2024) for TOC with Waddell.  Orders Placed This Encounter  Procedures   CBC with Differential/Platelet    Standing Status:   Future    Expiration Date:   07/22/2025   Comp Met (CMET)    Standing Status:   Future    Expiration Date:   07/22/2025   Hemoglobin A1c    Standing Status:   Future    Expiration Date:   07/22/2025    Requested Prescriptions   Signed Prescriptions Disp Refills   tirzepatide  (MOUNJARO ) 10 MG/0.5ML Pen 6 mL 0    Sig: Inject 10 mg into the skin once a week.

## 2024-07-27 ENCOUNTER — Other Ambulatory Visit (INDEPENDENT_AMBULATORY_CARE_PROVIDER_SITE_OTHER)

## 2024-07-27 DIAGNOSIS — E119 Type 2 diabetes mellitus without complications: Secondary | ICD-10-CM | POA: Diagnosis not present

## 2024-07-28 ENCOUNTER — Ambulatory Visit: Payer: Self-pay | Admitting: Family

## 2024-07-28 LAB — CBC WITH DIFFERENTIAL/PLATELET
Absolute Lymphocytes: 2051 {cells}/uL (ref 850–3900)
Absolute Monocytes: 585 {cells}/uL (ref 200–950)
Basophils Absolute: 39 {cells}/uL (ref 0–200)
Basophils Relative: 0.5 %
Eosinophils Absolute: 109 {cells}/uL (ref 15–500)
Eosinophils Relative: 1.4 %
HCT: 42.7 % (ref 35.0–45.0)
Hemoglobin: 12.8 g/dL (ref 11.7–15.5)
MCH: 21.8 pg — ABNORMAL LOW (ref 27.0–33.0)
MCHC: 30 g/dL — ABNORMAL LOW (ref 32.0–36.0)
MCV: 72.7 fL — ABNORMAL LOW (ref 80.0–100.0)
Monocytes Relative: 7.5 %
Neutro Abs: 5015 {cells}/uL (ref 1500–7800)
Neutrophils Relative %: 64.3 %
Platelets: 254 Thousand/uL (ref 140–400)
RBC: 5.87 Million/uL — ABNORMAL HIGH (ref 3.80–5.10)
RDW: 17 % — ABNORMAL HIGH (ref 11.0–15.0)
Total Lymphocyte: 26.3 %
WBC: 7.8 Thousand/uL (ref 3.8–10.8)

## 2024-07-28 LAB — HEMOGLOBIN A1C
Hgb A1c MFr Bld: 5.5 % (ref <5.7)
Mean Plasma Glucose: 111 mg/dL
eAG (mmol/L): 6.2 mmol/L

## 2024-07-28 LAB — COMPREHENSIVE METABOLIC PANEL WITH GFR
AG Ratio: 2.3 (calc) (ref 1.0–2.5)
ALT: 9 U/L (ref 6–29)
AST: 13 U/L (ref 10–35)
Albumin: 3.4 g/dL — ABNORMAL LOW (ref 3.6–5.1)
Alkaline phosphatase (APISO): 74 U/L (ref 37–153)
BUN/Creatinine Ratio: 14 (calc) (ref 6–22)
BUN: 20 mg/dL (ref 7–25)
CO2: 24 mmol/L (ref 20–32)
Calcium: 9.3 mg/dL (ref 8.6–10.4)
Chloride: 108 mmol/L (ref 98–110)
Creat: 1.38 mg/dL — ABNORMAL HIGH (ref 0.50–1.05)
Globulin: 1.5 g/dL — ABNORMAL LOW (ref 1.9–3.7)
Glucose, Bld: 84 mg/dL (ref 65–99)
Potassium: 4.3 mmol/L (ref 3.5–5.3)
Sodium: 139 mmol/L (ref 135–146)
Total Bilirubin: 0.2 mg/dL (ref 0.2–1.2)
Total Protein: 4.9 g/dL — ABNORMAL LOW (ref 6.1–8.1)
eGFR: 43 mL/min/1.73m2 — ABNORMAL LOW (ref >=60)

## 2024-09-21 ENCOUNTER — Ambulatory Visit

## 2024-09-21 DIAGNOSIS — E538 Deficiency of other specified B group vitamins: Secondary | ICD-10-CM

## 2024-09-21 MED ORDER — CYANOCOBALAMIN 1000 MCG/ML IJ SOLN
1000.0000 ug | Freq: Once | INTRAMUSCULAR | Status: AC
  Administered 2024-09-21: 1000 ug via INTRAMUSCULAR

## 2024-09-21 NOTE — Progress Notes (Signed)
 Pt here for monthly B12 injection per Leita Elbe  Last B12 injection: 07/20/2024  Last B12 level:  04/29/2024  B12 1000mcg given IM, and pt tolerated injection well.  Next B12 injection scheduled for: Pt will get next B12 at her visit with Waddell on 10/26/24

## 2024-10-05 ENCOUNTER — Other Ambulatory Visit: Payer: Self-pay | Admitting: Family Medicine

## 2024-10-05 MED ORDER — TIRZEPATIDE 10 MG/0.5ML ~~LOC~~ SOAJ: 10.0000 mg | SUBCUTANEOUS | 0 refills | Status: AC

## 2024-10-05 NOTE — Telephone Encounter (Signed)
 Copied from CRM #[id number]. Topic: Clinical - Medication Refill >> Oct 05, 2024 11:23 AM Rea C wrote: Medication: tirzepatide  (MOUNJARO ) 10 MG/0.5ML Pen - 3 month supply   Has the patient contacted their pharmacy? No. Patient is calling in to request 3 month supply. Patient is existing of Leita Elbe FNP  and is expected to see NP Waddell Mon on December 17th. Patient is requesting refill because she will be out before then, and it has to be a 3 month supply because she has a coupon for $25 to make the cost cheaper.  Patient said that if one month supply, they will charge $800 and she cannot afford that.  Patient is out and is asking if it could be sent over today.   This is the patient's preferred pharmacy:   [id number] [place], [place] - [address] [place] [address] Phone: [phone] Fax: [phone]  Is this the correct pharmacy for this prescription? Yes If no, delete pharmacy and type the correct one.   Has the prescription been filled recently? Yes  Is the patient out of the medication? Yes  Has the patient been seen for an appointment in the last year OR does the patient have an upcoming appointment? Yes  Can we respond through MyChart? Yes  Agent: Please be advised that Rx refills may take up to 3 business days. We ask that you follow-up with your pharmacy.

## 2024-10-17 ENCOUNTER — Other Ambulatory Visit: Payer: Self-pay

## 2024-10-17 MED ORDER — AMLODIPINE BESYLATE 5 MG PO TABS: 5.0000 mg | ORAL_TABLET | Freq: Every day | ORAL | 0 refills | Status: AC

## 2024-10-26 ENCOUNTER — Ambulatory Visit: Admitting: Family Medicine

## 2024-10-26 ENCOUNTER — Encounter: Payer: Self-pay | Admitting: Family Medicine

## 2024-10-26 VITALS — BP 96/73 | HR 98 | Ht 71.0 in | Wt 190.0 lb

## 2024-10-26 DIAGNOSIS — Z23 Encounter for immunization: Secondary | ICD-10-CM | POA: Diagnosis not present

## 2024-10-26 DIAGNOSIS — Z0000 Encounter for general adult medical examination without abnormal findings: Secondary | ICD-10-CM

## 2024-10-26 DIAGNOSIS — D509 Iron deficiency anemia, unspecified: Secondary | ICD-10-CM | POA: Diagnosis not present

## 2024-10-26 DIAGNOSIS — G4733 Obstructive sleep apnea (adult) (pediatric): Secondary | ICD-10-CM | POA: Diagnosis not present

## 2024-10-26 DIAGNOSIS — Z1231 Encounter for screening mammogram for malignant neoplasm of breast: Secondary | ICD-10-CM

## 2024-10-26 DIAGNOSIS — I10 Essential (primary) hypertension: Secondary | ICD-10-CM | POA: Diagnosis not present

## 2024-10-26 DIAGNOSIS — E119 Type 2 diabetes mellitus without complications: Secondary | ICD-10-CM | POA: Diagnosis not present

## 2024-10-26 NOTE — Assessment & Plan Note (Signed)
 Persistent fatigue, no recent sleep study. - Referred to pulmonology for sleep study to assess need for CPAP.

## 2024-10-26 NOTE — Assessment & Plan Note (Signed)
 Blood pressure is at goal for age and co-morbidities.   Recommendations: amlodipine  5 mg daily - BP goal <130/80 - monitor and log blood pressures at home - check around the same time each day in a relaxed setting - Limit salt to <2000 mg/day - Follow DASH eating plan (heart healthy diet) - limit alcohol to 2 standard drinks per day for men and 1 per day for women - avoid tobacco products - get at least 2 hours of regular aerobic exercise weekly Patient aware of signs/symptoms requiring further/urgent evaluation.

## 2024-10-26 NOTE — Assessment & Plan Note (Signed)
 Well-controlled with A1c of 5.5. Limited physical activity due to chronic ankle pain. - Continue Mounjaro  10 mg daily. - Ensure annual diabetic eye exam; verify provider or refer to ophthalmologist.

## 2024-10-26 NOTE — Assessment & Plan Note (Signed)
 Supplement and monitor

## 2024-10-26 NOTE — Progress Notes (Signed)
 New Patient Office Visit   Subjective     Patient ID: Mary [name], female   DOB: 04-23-1961  Age: 63 y.o. MRN: [id number]   CC:  Chief Complaint  Patient presents with   Establish Care   Diabetes      HPI Mary [name] presents to establish care. 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 No acute concerns today.    Hypertension: - Medications: amlodipine  5 mg daily - Compliance: good - Checking BP at home:  - Denies any SOB, recurrent headaches, CP, vision changes, LE edema, dizziness, palpitations, or medication side effects.   Diabetes: - Checking glucose at home:  - Medications: Mounjaro  10 mg weekly. - Compliance: good - Diet: low carb, heart healthy - Exercise: minimal  - Eye exam:  - Foot exam: Due - Microalbumin: Due - Denies symptoms of hypoglycemia, polyuria, polydipsia, numbness extremities, foot ulcers/trauma, wounds that are not healing, medication side effects  Lab Results  Component Value Date   HGBA1C 5.5 07/27/2024     Vitamin B12 Deficiency: - Managed on B12 1000 mcg IM monthly.  Lab Results  Component Value Date   VITAMINB12 504 04/29/2024    Iron deficiency: - OTC iron daily    Sleep apnea: - hasn't used CPAP in awhile, no recent sleep study   Neuropathy, chemo induced (colon cancer hx); - gabapentin  300 mg TID working well    Show/hide medication list[1] Past Medical History:  Diagnosis Date   Adenocarcinoma sigmoid colon 05/12/2011   Stage III(pT2pN2b) descending sigmoid   Arm DVT (deep venous thromboembolism), acute (HCC) 07/04/2011   r/t PAC   Blood transfusion    Cancer of descending colon (HCC) 05/12/2011   Diverticulitis    GERD (gastroesophageal reflux disease)    History of migraine headaches    Hypertension    Incisional hernia    iron deficiency anemia    Multiple hemangiomas    Liver   Neuropathy due to drug 2012   Early--assoc. w/prolonged cold sensitivity--Oxaliplatin  related    Pneumothorax 07/19/2011   Bilateral   Pre-diabetes    Renal cysts, acquired, bilateral    Sleep apnea    does not use CPAP on regular basis    Past Surgical History:  Procedure Laterality Date   ABDOMINAL HYSTERECTOMY  mid 20s   bleeding   ACHILLES TENDON LENGTHENING Left 03/24/2024   Procedure: LENGTHENING, TENDON, ACHILLES;  Surgeon: Kit Rush, MD;  Location: [facility];  Service: Orthopedics;  Laterality: Left;   BREAST BIOPSY Right 08/2021   COLON SURGERY  05/17/2011   Partial colectomy   COLONOSCOPY  09/04/2014   One diminutive polyp at the recto sigmoid colon removed. Diverticulosis of the entire examined colon. Internal hemorrhoids.    HERNIA REPAIR  09/06/2012   lap incisional hernia   INSERTION OF MESH  09/06/2012   Procedure: INSERTION OF MESH;  Surgeon: Lynwood MALVA Pina, MD;  Location: [facility];  Service: General;  Laterality: N/A;   ORIF ANKLE FRACTURE Left 12/27/2020   Procedure: OPEN REDUCTION INTERNAL FIXATION (ORIF) Left ankle trimalleolar fracture dislocation;  Surgeon: Kit Rush, MD;  Location: [facility];  Service: Orthopedics;  Laterality: Left;    PORT-A-CATH REMOVAL  05/06/2012   Procedure: MINOR REMOVAL PORT-A-CATH;  Surgeon: Lynwood MALVA Pina, MD;  Location: [facility];  Service: General;  Laterality: Right;   PORTACATH PLACEMENT  07/20/2011   Right side placed   PORTACATH PLACEMENT  left side & right side   TOTAL ANKLE ARTHROPLASTY Left 03/24/2024   Procedure: ARTHROPLASTY, ANKLE, TOTAL;  Surgeon: Kit Rush, MD;  Location: [facility];  Service: Orthopedics;  Laterality: Left;   UPPER GI ENDOSCOPY N/A 09/22/2022   Procedure: UPPER GI ENDOSCOPY;  Surgeon: Gladis Cough, MD;  Location: [facility];  Service: General;  Laterality: N/A;   VENTRAL HERNIA REPAIR  09/06/2012   Procedure: LAPAROSCOPIC VENTRAL HERNIA;  Surgeon: Lynwood MALVA Pina, MD;  Location: [facility];  Service: General;  Laterality: N/A;      Family History  Problem Relation Age of Onset   Diabetes Mother    Hypertension Mother    Cancer Father        leukemia   Cancer Maternal Uncle        lung   Cancer Maternal Uncle        colon   Colitis Maternal Uncle    Cancer Maternal Uncle        prostate   Breast cancer Cousin 33       paternal first cousin   Cancer Brother        PROSTATE   Other Brother        MVA   Esophageal cancer Neg Hx    Rectal cancer Neg Hx    Stomach cancer Neg Hx     Social History   Socioeconomic History   Marital status: Married    Spouse name: Not on file   Number of children: 3   Years of education: Not on file   Highest education level: Not on file  Occupational History   Occupation: Aeronautical engineer  Tobacco Use   Smoking status: Never   Smokeless tobacco: Never  Vaping Use   Vaping status: Never Used  Substance and Sexual Activity   Alcohol use: No   Drug use: No   Sexual activity: Yes    Birth control/protection: Surgical    Comment: 1st intercourse 63 yo-Fewer than 5 partners  Other Topics Concern   Not on file  Social History Narrative   Lives with husband in a 1 story home.  Has 3 daughters and 7 grandkids and one on the way.     Works as a insurance account manager.    Education: high school.   Social Drivers of Health   Tobacco Use: Low Risk (10/26/2024)   Patient History    Smoking Tobacco Use: Never    Smokeless Tobacco Use: Never    Passive Exposure: Not on file  Financial Resource Strain: Not on file  Food Insecurity: Not on file  Transportation Needs: Not on file  Physical Activity: Not on file  Stress: Not on file  Social Connections: Not on file  Depression (PHQ2-9): Low Risk (10/26/2024)   Depression (PHQ2-9)    PHQ-2 Score: 4  Alcohol Screen: Not on file  Housing: Unknown (08/10/2024)   [facility]   Epic    At any time in the past 12 months, were you homeless or living in a shelter (including now)?: No    Number of  Times Moved in the Last Year: Not on file    Unable to Pay for Housing in the Last Year: Not on file  Utilities: Not on file  Health Literacy: Not on file       ROS All review of systems negative except what is listed in the HPI    Objective     BP 96/73   Pulse  98   Ht 5' 11 (1.803 m)   Wt 190 lb (86.2 kg)   SpO2 100%   BMI 26.50 kg/m   Physical Exam Vitals reviewed.  Constitutional:      Appearance: Normal appearance.  Cardiovascular:     Rate and Rhythm: Normal rate and regular rhythm.     Heart sounds: Normal heart sounds.  Pulmonary:     Effort: Pulmonary effort is normal.     Breath sounds: Normal breath sounds.  Skin:    General: Skin is warm and dry.  Neurological:     Mental Status: She is alert and oriented to person, place, and time.  Psychiatric:        Mood and Affect: Mood normal.        Behavior: Behavior normal.        Thought Content: Thought content normal.        Judgment: Judgment normal.          Assessment & Plan:     Problem List Items Addressed This Visit       Active Problems   Iron deficiency anemia, unspecified (Chronic)   Supplement and monitor      OSA (obstructive sleep apnea)   Persistent fatigue, no recent sleep study. - Referred to pulmonology for sleep study to assess need for CPAP.      Relevant Orders   Ambulatory referral to Pulmonology   Type 2 diabetes mellitus without complication, without long-term current use of insulin  (HCC) - Primary   Well-controlled with A1c of 5.5. Limited physical activity due to chronic ankle pain. - Continue Mounjaro  10 mg daily. - Ensure annual diabetic eye exam; verify provider or refer to ophthalmologist.      Primary hypertension   Blood pressure is at goal for age and co-morbidities.   Recommendations: amlodipine  5 mg daily - BP goal <130/80 - monitor and log blood pressures at home - check around the same time each day in a relaxed setting - Limit salt to <2000 mg/day -  Follow DASH eating plan (heart healthy diet) - limit alcohol to 2 standard drinks per day for men and 1 per day for women - avoid tobacco products - get at least 2 hours of regular aerobic exercise weekly Patient aware of signs/symptoms requiring further/urgent evaluation.        Other Visit Diagnoses       Encounter for medical examination to establish care         Immunization due       Relevant Orders   Flu vaccine trivalent PF, 6mos and older(Flulaval,Afluria,Fluarix,Fluzone) (Completed)   Pneumococcal conjugate vaccine 20-valent (Prevnar 20) (Completed)     Encounter for screening mammogram for malignant neoplasm of breast       Relevant Orders   MM 3D SCREENING MAMMOGRAM BILATERAL BREAST        Declined labs today. Will update at next visit.      Return in about 3 months (around 01/24/2025) for chronic disease management.  Waddell KATHEE Mon, NP  I,Emily Lagle,acting as a scribe for Waddell KATHEE Mon, NP.,have documented all relevant documentation on the behalf of Waddell KATHEE Mon, NP.  I, Waddell KATHEE Mon, NP, have reviewed all documentation for this visit. The documentation on 10/26/2024 for the exam, diagnosis, procedures, and orders are all accurate and complete.     [1]  Outpatient Medications Prior to Visit  Medication Sig Note   ACCU-CHEK GUIDE test strip USE   TO CHECK GLUCOSE UP  TO 4 TIMES DAILY    Accu-Chek Softclix Lancets lancets USE   TO CHECK GLUCOSE UP TO 4 TIMES DAILY    amLODipine  (NORVASC ) 5 MG tablet Take 1 tablet (5 mg total) by mouth daily.    blood glucose meter kit and supplies KIT Dispense based on patient and insurance preference. Use up to four times daily as directed.    docusate sodium  (COLACE) 100 MG capsule Take 1 capsule (100 mg total) by mouth 2 (two) times daily. While taking narcotic pain medicine.    famotidine  (PEPCID ) 20 MG tablet Take 10 mg by mouth 2 (two) times daily.    gabapentin  (NEURONTIN ) 300 MG capsule TAKE 1 CAPSULE BY MOUTH THREE  TIMES DAILY    IRON, FERROUS SULFATE, PO Take 1 tablet by mouth daily.    Oxycodone  HCl 10 MG TABS Take 10 mg by mouth 4 (four) times daily as needed (pain.).    pantoprazole  (PROTONIX ) 40 MG tablet Take 40 mg by mouth 2 (two) times daily.    senna (SENOKOT) 8.6 MG TABS tablet Take 2 tablets (17.2 mg total) by mouth 2 (two) times daily.    tirzepatide  (MOUNJARO ) 10 MG/0.5ML Pen Inject 10 mg into the skin once a week.    [DISCONTINUED] rivaroxaban  (XARELTO ) 10 MG TABS tablet Take 1 tablet (10 mg total) by mouth daily. 10/26/2024: states specialist stopped   Facility-Administered Medications Prior to Visit  Medication Dose Route Frequency Provider   cyanocobalamin  ((VITAMIN B-12)) injection 1,000 mcg  1,000 mcg Intramuscular Once Murray, Laura Woodruff, FNP

## 2024-11-23 ENCOUNTER — Ambulatory Visit

## 2024-12-21 ENCOUNTER — Ambulatory Visit

## 2025-01-18 ENCOUNTER — Encounter: Admitting: Primary Care

## 2025-01-25 ENCOUNTER — Ambulatory Visit: Admitting: Family Medicine
